# Patient Record
Sex: Female | Born: 1955 | Race: White | Hispanic: No | Smoking: Former smoker
Health system: Southern US, Community
[De-identification: ages and names within clinical notes are randomized; demographics above are authoritative.]

## PROBLEM LIST (undated history)

## (undated) DIAGNOSIS — I493 Ventricular premature depolarization: Secondary | ICD-10-CM

## (undated) DIAGNOSIS — Z9889 Other specified postprocedural states: Secondary | ICD-10-CM

## (undated) DIAGNOSIS — I10 Essential (primary) hypertension: Secondary | ICD-10-CM

## (undated) DIAGNOSIS — M1990 Unspecified osteoarthritis, unspecified site: Secondary | ICD-10-CM

## (undated) DIAGNOSIS — E559 Vitamin D deficiency, unspecified: Secondary | ICD-10-CM

## (undated) DIAGNOSIS — Z973 Presence of spectacles and contact lenses: Secondary | ICD-10-CM

## (undated) DIAGNOSIS — J449 Chronic obstructive pulmonary disease, unspecified: Secondary | ICD-10-CM

## (undated) DIAGNOSIS — I8390 Asymptomatic varicose veins of unspecified lower extremity: Secondary | ICD-10-CM

## (undated) DIAGNOSIS — I2510 Atherosclerotic heart disease of native coronary artery without angina pectoris: Secondary | ICD-10-CM

## (undated) DIAGNOSIS — T8859XA Other complications of anesthesia, initial encounter: Secondary | ICD-10-CM

## (undated) DIAGNOSIS — G43109 Migraine with aura, not intractable, without status migrainosus: Secondary | ICD-10-CM

## (undated) DIAGNOSIS — Q605 Renal hypoplasia, unspecified: Secondary | ICD-10-CM

## (undated) DIAGNOSIS — R112 Nausea with vomiting, unspecified: Secondary | ICD-10-CM

## (undated) DIAGNOSIS — E041 Nontoxic single thyroid nodule: Secondary | ICD-10-CM

## (undated) DIAGNOSIS — Z8669 Personal history of other diseases of the nervous system and sense organs: Secondary | ICD-10-CM

## (undated) DIAGNOSIS — T4145XA Adverse effect of unspecified anesthetic, initial encounter: Secondary | ICD-10-CM

## (undated) DIAGNOSIS — F419 Anxiety disorder, unspecified: Secondary | ICD-10-CM

## (undated) DIAGNOSIS — K219 Gastro-esophageal reflux disease without esophagitis: Secondary | ICD-10-CM

## (undated) DIAGNOSIS — IMO0001 Reserved for inherently not codable concepts without codable children: Secondary | ICD-10-CM

## (undated) DIAGNOSIS — F411 Generalized anxiety disorder: Secondary | ICD-10-CM

## (undated) DIAGNOSIS — H539 Unspecified visual disturbance: Secondary | ICD-10-CM

## (undated) DIAGNOSIS — R413 Other amnesia: Secondary | ICD-10-CM

## (undated) DIAGNOSIS — C801 Malignant (primary) neoplasm, unspecified: Secondary | ICD-10-CM

## (undated) HISTORY — DX: Personal history of other diseases of the nervous system and sense organs: Z86.69

## (undated) HISTORY — PX: TONSILLECTOMY: SUR1361

## (undated) HISTORY — DX: Renal hypoplasia, unspecified: Q60.5

## (undated) HISTORY — DX: Generalized anxiety disorder: F41.1

## (undated) HISTORY — DX: Unspecified visual disturbance: H53.9

## (undated) HISTORY — DX: Unspecified osteoarthritis, unspecified site: M19.90

## (undated) HISTORY — PX: VARICOSE VEIN SURGERY: SHX832

## (undated) HISTORY — DX: Migraine with aura, not intractable, without status migrainosus: G43.109

## (undated) HISTORY — DX: Essential (primary) hypertension: I10

## (undated) HISTORY — DX: Other amnesia: R41.3

## (undated) HISTORY — DX: Vitamin D deficiency, unspecified: E55.9

## (undated) HISTORY — DX: Nontoxic single thyroid nodule: E04.1

## (undated) HISTORY — DX: Asymptomatic varicose veins of unspecified lower extremity: I83.90

## (undated) HISTORY — PX: CARPAL TUNNEL RELEASE: SHX101

## (undated) HISTORY — PX: DILATION AND CURETTAGE OF UTERUS: SHX78

## (undated) HISTORY — DX: Atherosclerotic heart disease of native coronary artery without angina pectoris: I25.10

## (undated) HISTORY — PX: COLONOSCOPY W/ BIOPSIES AND POLYPECTOMY: SHX1376

---

## 2002-01-24 ENCOUNTER — Other Ambulatory Visit: Admission: RE | Admit: 2002-01-24 | Discharge: 2002-01-24 | Payer: Self-pay

## 2009-02-28 ENCOUNTER — Other Ambulatory Visit: Admission: RE | Admit: 2009-02-28 | Discharge: 2009-02-28 | Payer: Self-pay | Admitting: Obstetrics and Gynecology

## 2009-07-07 ENCOUNTER — Encounter: Admission: RE | Admit: 2009-07-07 | Discharge: 2009-07-07 | Payer: Self-pay | Admitting: Neurological Surgery

## 2009-11-19 ENCOUNTER — Emergency Department (HOSPITAL_COMMUNITY): Admission: EM | Admit: 2009-11-19 | Discharge: 2009-11-20 | Payer: Self-pay | Admitting: Emergency Medicine

## 2011-11-19 ENCOUNTER — Other Ambulatory Visit: Payer: Self-pay | Admitting: Internal Medicine

## 2011-11-19 DIAGNOSIS — H539 Unspecified visual disturbance: Secondary | ICD-10-CM

## 2011-11-24 ENCOUNTER — Inpatient Hospital Stay: Admission: RE | Admit: 2011-11-24 | Payer: Self-pay | Source: Ambulatory Visit

## 2011-12-01 ENCOUNTER — Ambulatory Visit
Admission: RE | Admit: 2011-12-01 | Discharge: 2011-12-01 | Disposition: A | Discharge status: Home / Self Care | Payer: Managed Care, Other (non HMO) | Source: Ambulatory Visit | Attending: Internal Medicine | Admitting: Internal Medicine

## 2011-12-01 DIAGNOSIS — H539 Unspecified visual disturbance: Secondary | ICD-10-CM

## 2011-12-01 MED ORDER — GADOBENATE DIMEGLUMINE 529 MG/ML IV SOLN
14.0000 mL | Freq: Once | INTRAVENOUS | Status: AC | PRN
  Administered 2011-12-01: 14 mL via INTRAVENOUS

## 2012-01-25 DIAGNOSIS — H53129 Transient visual loss, unspecified eye: Secondary | ICD-10-CM

## 2012-01-25 HISTORY — DX: Transient visual loss, unspecified eye: H53.129

## 2012-04-28 ENCOUNTER — Other Ambulatory Visit: Payer: Self-pay | Admitting: Internal Medicine

## 2012-04-28 DIAGNOSIS — E041 Nontoxic single thyroid nodule: Secondary | ICD-10-CM

## 2012-05-11 ENCOUNTER — Ambulatory Visit
Admission: RE | Admit: 2012-05-11 | Discharge: 2012-05-11 | Disposition: A | Discharge status: Home / Self Care | Payer: Managed Care, Other (non HMO) | Source: Ambulatory Visit | Attending: Internal Medicine | Admitting: Internal Medicine

## 2012-05-11 DIAGNOSIS — E041 Nontoxic single thyroid nodule: Secondary | ICD-10-CM

## 2012-05-15 ENCOUNTER — Other Ambulatory Visit: Payer: Self-pay | Admitting: Internal Medicine

## 2012-05-15 DIAGNOSIS — E041 Nontoxic single thyroid nodule: Secondary | ICD-10-CM

## 2012-05-18 ENCOUNTER — Ambulatory Visit
Admission: RE | Admit: 2012-05-18 | Discharge: 2012-05-18 | Disposition: A | Discharge status: Home / Self Care | Payer: Managed Care, Other (non HMO) | Source: Ambulatory Visit | Attending: Internal Medicine | Admitting: Internal Medicine

## 2012-05-18 ENCOUNTER — Other Ambulatory Visit (HOSPITAL_COMMUNITY)
Admission: RE | Admit: 2012-05-18 | Discharge: 2012-05-18 | Disposition: A | Discharge status: Home / Self Care | Payer: Managed Care, Other (non HMO) | Source: Ambulatory Visit | Attending: Diagnostic Radiology | Admitting: Diagnostic Radiology

## 2012-05-18 DIAGNOSIS — E041 Nontoxic single thyroid nodule: Secondary | ICD-10-CM

## 2012-05-18 DIAGNOSIS — E049 Nontoxic goiter, unspecified: Secondary | ICD-10-CM | POA: Insufficient documentation

## 2012-06-29 ENCOUNTER — Other Ambulatory Visit: Payer: Self-pay | Admitting: Internal Medicine

## 2012-06-29 ENCOUNTER — Other Ambulatory Visit (HOSPITAL_COMMUNITY)
Admission: RE | Admit: 2012-06-29 | Discharge: 2012-06-29 | Disposition: A | Discharge status: Home / Self Care | Payer: Managed Care, Other (non HMO) | Source: Ambulatory Visit | Attending: Internal Medicine | Admitting: Internal Medicine

## 2012-06-29 DIAGNOSIS — Z01419 Encounter for gynecological examination (general) (routine) without abnormal findings: Secondary | ICD-10-CM | POA: Insufficient documentation

## 2012-06-29 DIAGNOSIS — Z1151 Encounter for screening for human papillomavirus (HPV): Secondary | ICD-10-CM | POA: Insufficient documentation

## 2012-11-09 ENCOUNTER — Ambulatory Visit (INDEPENDENT_AMBULATORY_CARE_PROVIDER_SITE_OTHER): Payer: Managed Care, Other (non HMO) | Admitting: Neurology

## 2012-11-09 ENCOUNTER — Encounter: Payer: Self-pay | Admitting: Neurology

## 2012-11-09 VITALS — BP 130/75 | HR 86 | Ht 66.0 in | Wt 151.0 lb

## 2012-11-09 DIAGNOSIS — H53129 Transient visual loss, unspecified eye: Secondary | ICD-10-CM

## 2012-11-09 DIAGNOSIS — G43809 Other migraine, not intractable, without status migrainosus: Secondary | ICD-10-CM

## 2012-11-09 DIAGNOSIS — G43109 Migraine with aura, not intractable, without status migrainosus: Secondary | ICD-10-CM

## 2012-11-09 HISTORY — DX: Migraine with aura, not intractable, without status migrainosus: G43.109

## 2012-11-09 MED ORDER — PREDNISONE 10 MG PO TABS: ORAL_TABLET | ORAL | Status: DC

## 2012-11-09 NOTE — Progress Notes (Signed)
Reason for visit: Migraine equivalent  [name] [name] is an 57 y.o. female  History of present illness:  [name] [name] is a 57 year old patient, right-handed, with a history of transient episodes of visual disturbance primarily in the left homonymous visual field. The episodes last about 30 minutes and then clear. The patient was placed on verapamil, and the episodes disappeared for 4 months. The patient had a recent episode lasting about 40 minutes, but this was the first episode in 4 months. The patient denies any headache with the events. The patient reports no numbness or weakness of the face, arms, or legs. The patient has had a negative workup to include a MRI of the brain, MRA of the head, and carotid Doppler study. The patient does smoke cigarettes. The patient has some episodes of dizziness on occasion on the verapamil. The patient returns for an evaluation. The patient also reports some mild forgetfulness at times.  Past Medical History  Diagnosis Date  . Visual disturbance     Episodic  . History of migraine   . Hypertension   . Thyroid nodule   . Varicose veins     left leg  . Vitamin D deficiency   . Congenital renal atrophy     left kidney  . Migraine equivalent 11/09/2012  . Degenerative arthritis     Past Surgical History  Procedure Laterality Date  . Tonsillectomy    . Cesarean section    . Carpal tunnel release Left     Family History  Problem Relation Age of Onset  . Cancer Mother     cancer of the small intestines  . Hypertension Father   . Atrial fibrillation Father   . Diverticulitis Father   . Hypertension Sister   . Diverticulitis Brother   . Hypertension Sister   . Headache Sister     Social history:  reports that she has been smoking Cigarettes.  She has been smoking about 0.50 packs per day. She has never used smokeless tobacco. She reports that  drinks alcohol. She reports that she does not use illicit drugs.  Allergies:  Allergies  Allergen  Reactions  . Sulfa Antibiotics     Medications:  No current outpatient prescriptions on file prior to visit.   No current facility-administered medications on file prior to visit.    ROS:  Out of a complete 14 system review of symptoms, the patient complains only of the following symptoms, and all other reviewed systems are negative.  Fatigue Loss of vision Shortness of breath, cough Memory loss Dizziness  Blood pressure 130/75, pulse 86, height 5\' 6"  (1.676 m), weight 151 lb (68.493 kg).  Physical Exam  General: The patient is alert and cooperative at the time of the examination.  Skin: No significant peripheral edema is noted.   Neurologic Exam  Cranial nerves: Facial symmetry is present. Speech is normal, no aphasia or dysarthria is noted. Extraocular movements are full. Visual fields are full. Pupils are equal, round, and reactive to light. Discs are flat bilaterally.  Motor: The patient has good strength in all 4 extremities.  Coordination: The patient has good finger-nose-finger and heel-to-shin bilaterally.  Gait and station: The patient has a normal gait. Tandem gait is normal. Romberg is negative. No drift is seen.  Reflexes: Deep tendon reflexes are symmetric.   Assessment/Plan:  1. Transient visual loss, migraine equivalent  The patient will continue the verapamil at 180 mg dose for now. If the episodes of visual disturbance continue, the dose  may need to be increased to a 240 milligrams daily dose. The patient otherwise will followup through this office if needed.  Marlan Palau MD 11/09/2012 7:22 PM  [facility] [address] [place], [place] [address]  Phone [phone] Fax [phone]

## 2012-11-21 ENCOUNTER — Telehealth: Payer: Self-pay | Admitting: *Deleted

## 2012-11-21 NOTE — Telephone Encounter (Signed)
I called patient. I am not clear why the prescription for prednisone was called and under her name. She is not to take this medication. If her symptoms persist, we will go to verapamil, taking 120 mg tablets twice daily. The patient will call me if she believes that her symptoms are worsening.

## 2012-11-21 NOTE — Telephone Encounter (Signed)
Please advise on prednisone.  Thanks

## 2012-11-21 NOTE — Telephone Encounter (Signed)
Message copied by Hermenia Fiscal on Tue Nov 21, 2012  2:14 PM ------      Message from: Seth Bake      Created: Tue Nov 21, 2012 12:42 PM      Contact: PATIENT       States she was seen on June 6 and was instructed to call if her problem continued.  But she got notification from her pharmacy that she has a prescription for Prednisone from Dr. Anne Hahn.  Is she suppose to take it?  States this was not mentioned to her at her visit. Please call to let her know if she is indeed suppose to take it.   OK to leave a message if she does not answer.   ------

## 2013-09-11 ENCOUNTER — Other Ambulatory Visit: Payer: Self-pay

## 2013-09-19 ENCOUNTER — Other Ambulatory Visit: Payer: Self-pay | Admitting: Gastroenterology

## 2013-09-19 DIAGNOSIS — R1310 Dysphagia, unspecified: Secondary | ICD-10-CM

## 2013-09-20 ENCOUNTER — Ambulatory Visit
Admission: RE | Admit: 2013-09-20 | Discharge: 2013-09-20 | Disposition: A | Discharge status: Home / Self Care | Payer: Managed Care, Other (non HMO) | Source: Ambulatory Visit | Attending: Gastroenterology | Admitting: Gastroenterology

## 2013-09-20 DIAGNOSIS — R1310 Dysphagia, unspecified: Secondary | ICD-10-CM

## 2013-11-09 ENCOUNTER — Other Ambulatory Visit: Payer: Self-pay | Admitting: Gastroenterology

## 2014-02-15 ENCOUNTER — Ambulatory Visit
Admission: RE | Admit: 2014-02-15 | Discharge: 2014-02-15 | Disposition: A | Discharge status: Home / Self Care | Payer: Managed Care, Other (non HMO) | Source: Ambulatory Visit | Attending: Internal Medicine | Admitting: Internal Medicine

## 2014-02-15 ENCOUNTER — Other Ambulatory Visit: Payer: Self-pay | Admitting: Internal Medicine

## 2014-02-15 DIAGNOSIS — R042 Hemoptysis: Secondary | ICD-10-CM

## 2014-04-04 ENCOUNTER — Institutional Professional Consult (permissible substitution): Payer: Managed Care, Other (non HMO) | Admitting: Emergency Medicine

## 2014-04-09 ENCOUNTER — Encounter: Payer: Self-pay | Admitting: Emergency Medicine

## 2014-04-09 ENCOUNTER — Ambulatory Visit (INDEPENDENT_AMBULATORY_CARE_PROVIDER_SITE_OTHER): Payer: Managed Care, Other (non HMO) | Admitting: Emergency Medicine

## 2014-04-09 ENCOUNTER — Encounter (INDEPENDENT_AMBULATORY_CARE_PROVIDER_SITE_OTHER): Payer: Self-pay

## 2014-04-09 VITALS — BP 142/86 | HR 88 | Temp 97.0°F | Ht 65.5 in | Wt 165.4 lb

## 2014-04-09 DIAGNOSIS — Z87891 Personal history of nicotine dependence: Secondary | ICD-10-CM | POA: Insufficient documentation

## 2014-04-09 DIAGNOSIS — J9811 Atelectasis: Secondary | ICD-10-CM

## 2014-04-09 DIAGNOSIS — R05 Cough: Secondary | ICD-10-CM

## 2014-04-09 DIAGNOSIS — R059 Cough, unspecified: Secondary | ICD-10-CM | POA: Insufficient documentation

## 2014-04-09 DIAGNOSIS — J449 Chronic obstructive pulmonary disease, unspecified: Secondary | ICD-10-CM | POA: Insufficient documentation

## 2014-04-09 DIAGNOSIS — R042 Hemoptysis: Secondary | ICD-10-CM

## 2014-04-09 MED ORDER — OMEPRAZOLE 20 MG PO CPDR: 20.0000 mg | DELAYED_RELEASE_CAPSULE | Freq: Two times a day (BID) | ORAL | Status: DC

## 2014-04-09 MED ORDER — FLUTICASONE PROPIONATE 50 MCG/ACT NA SUSP: 2.0000 | Freq: Two times a day (BID) | NASAL | Status: DC

## 2014-04-09 MED ORDER — LORATADINE 10 MG PO TABS: 10.0000 mg | ORAL_TABLET | Freq: Every day | ORAL | Status: DC

## 2014-04-09 NOTE — Assessment & Plan Note (Signed)
Paroxysmal cough with unclear etiology. She has been diagnosed with obstructive lung disease but has not benefited from bronchodilators. Suspect that her allergic rhinitis is at least one component since she has responded somewhat to Flonase. Consider also GERD since this is a common exacerbating her cough. Her linear atelectasis on chest x-ray and her recent hemoptysis basic question of an anatomical finding. I believe she needs a CT scan of the chest to better define her airways and parenchymal disease. She may merit bronchoscopy depending on her CT results and her response to empiric therapy. Will perform full PFT to better define her airflow limitation.. Follow-up in one month

## 2014-04-09 NOTE — Patient Instructions (Signed)
We will perform a CT scan of the chest Increase your fluticasone nasal spray to 2 sprays each side twice a day Restart loratadine 10mg  daily Start omeprazole 20mg  twice a day until next visit We will perform full pulmonary function testing Follow with Dr Lamonte Sakai in 1 month

## 2014-04-09 NOTE — Progress Notes (Signed)
Subjective:    Patient ID: [name] [name], female    DOB: 1956/01/25, 58 y.o.   MRN: [id number]  HPI 58 yo woman, former smoker (20 pk-yrs), hx of HTN, migraines. She is referred by Dr Amedeo Kinsman for chronic cough, hemoptysis. She has been given a dx of COPD in 2014. Spirometry was done > per pt shows AFL. Imaging has identified some L sided linear atx (CXR 02/2014).   The cough has been non-productive for most part, often paroxysmal, can cause emesis. Has waxed and waned. She has been treated with abx, with scheduled BD's. For about a week in August she experienced some hemoptysis, blood streaks in clear sputum.    She denies any real allergy sx. She tries loratadine on her own > no difference. Was started on flonase and seemed to help some. She does not have any heartburn or noticeable GERD   Review of Systems  Constitutional: Negative for fever and unexpected weight change.  HENT: Negative for congestion, dental problem, ear pain, nosebleeds, postnasal drip, rhinorrhea, sinus pressure, sneezing, sore throat and trouble swallowing.   Eyes: Negative for redness and itching.  Respiratory: Positive for cough and shortness of breath. Negative for chest tightness and wheezing.   Cardiovascular: Negative for palpitations and leg swelling.  Gastrointestinal: Negative for nausea and vomiting.  Genitourinary: Negative for dysuria.  Musculoskeletal: Negative for joint swelling.  Skin: Negative for rash.  Neurological: Negative for headaches.  Hematological: Does not bruise/bleed easily.  Psychiatric/Behavioral: Negative for dysphoric mood. The patient is not nervous/anxious.     Past Medical History  Diagnosis Date  . Visual disturbance     Episodic  . History of migraine   . Hypertension   . Thyroid nodule   . Varicose veins     left leg  . Vitamin D deficiency   . Congenital renal atrophy     left kidney  . Migraine equivalent 11/09/2012  . Degenerative arthritis      Family History    Problem Relation Age of Onset  . Cancer Mother     cancer of the small intestines  . Hypertension Father   . Atrial fibrillation Father   . Diverticulitis Father   . Hypertension Sister   . Diverticulitis Brother   . Hypertension Sister   . Headache Sister      History   Social History  . Marital Status: Married    Spouse Name: N/A    Number of Children: 1  . Years of Education: 16   Occupational History  . nurse [employer]   Social History Main Topics  . Smoking status: Former Smoker -- 0.50 packs/day for 40 years    Types: Cigarettes    Quit date: 03/17/2013  . Smokeless tobacco: Never Used  . Alcohol Use: 0.0 oz/week    0 Not specified per week     Comment: Drinks wine on occasion  . Drug Use: No  . Sexual Activity: Not on file   Other Topics Concern  . Not on file   Social History Narrative     Allergies  Allergen Reactions  . Hydrocodone-Guaifenesin Itching  . Sulfa Antibiotics Hives and Swelling     Outpatient Prescriptions Prior to Visit  Medication Sig Dispense Refill  . verapamil (CALAN-SR) 180 MG CR tablet Take 180 mg by mouth at bedtime.     No facility-administered medications prior to visit.         Objective:   Physical Exam Filed Vitals:   04/09/14  1440  BP: 142/86  Pulse: 88  Temp: 97 F (36.1 C)  TempSrc: Oral  Height: 5' 5.5" (1.664 m)  Weight: 165 lb 6.4 oz (75.025 kg)  SpO2: 97%   Gen: Pleasant, well-nourished, in no distress,  normal affect  ENT: No lesions,  mouth clear,  oropharynx clear, no postnasal drip  Lungs: No use of accessory muscles, clear without rales or rhonchi  Cardiovascular: RRR, heart sounds normal, no murmur or gallops, no peripheral edema  Musculoskeletal: No deformities, no cyanosis or clubbing  Neuro: alert, non focal  Skin: Warm, no lesions or rashes       Assessment & Plan:  Cough Paroxysmal cough with unclear etiology. She has been diagnosed with obstructive lung disease but  has not benefited from bronchodilators. Suspect that her allergic rhinitis is at least one component since she has responded somewhat to Flonase. Consider also GERD since this is a common exacerbating her cough. Her linear atelectasis on chest x-ray and her recent hemoptysis basic question of an anatomical finding. I believe she needs a CT scan of the chest to better define her airways and parenchymal disease. She may merit bronchoscopy depending on her CT results and her response to empiric therapy. Will perform full PFT to better define her airflow limitation.. Follow-up in one month  Hemoptysis Resolved. This may push [place] to perform bronchoscopy sooner.

## 2014-04-09 NOTE — Assessment & Plan Note (Signed)
Resolved. This may push [place] to perform bronchoscopy sooner.

## 2014-04-11 ENCOUNTER — Telehealth: Payer: Self-pay | Admitting: Emergency Medicine

## 2014-04-11 ENCOUNTER — Other Ambulatory Visit: Payer: Self-pay | Admitting: Emergency Medicine

## 2014-04-11 ENCOUNTER — Ambulatory Visit (INDEPENDENT_AMBULATORY_CARE_PROVIDER_SITE_OTHER)
Admission: RE | Admit: 2014-04-11 | Discharge: 2014-04-11 | Disposition: A | Discharge status: Home / Self Care | Payer: Managed Care, Other (non HMO) | Source: Ambulatory Visit | Attending: Emergency Medicine | Admitting: Emergency Medicine

## 2014-04-11 DIAGNOSIS — Z87891 Personal history of nicotine dependence: Secondary | ICD-10-CM

## 2014-04-11 DIAGNOSIS — J449 Chronic obstructive pulmonary disease, unspecified: Secondary | ICD-10-CM

## 2014-04-11 DIAGNOSIS — R042 Hemoptysis: Secondary | ICD-10-CM

## 2014-04-11 DIAGNOSIS — J9811 Atelectasis: Secondary | ICD-10-CM

## 2014-04-11 DIAGNOSIS — R918 Other nonspecific abnormal finding of lung field: Secondary | ICD-10-CM

## 2014-04-11 MED ORDER — IOHEXOL 300 MG/ML  SOLN
80.0000 mL | Freq: Once | INTRAMUSCULAR | Status: AC | PRN
  Administered 2014-04-11: 80 mL via INTRAVENOUS

## 2014-04-11 NOTE — Telephone Encounter (Signed)
Reviewed CT findings with her this pm. I believe best strategy for bx will be EBUS. I will attempt to arrange asap.

## 2014-04-15 ENCOUNTER — Encounter (HOSPITAL_COMMUNITY)
Admission: RE | Admit: 2014-04-15 | Discharge: 2014-04-15 | Disposition: A | Discharge status: Home / Self Care | Payer: Managed Care, Other (non HMO) | Source: Ambulatory Visit | Attending: Emergency Medicine | Admitting: Emergency Medicine

## 2014-04-15 ENCOUNTER — Encounter (HOSPITAL_COMMUNITY): Payer: Self-pay

## 2014-04-15 ENCOUNTER — Telehealth: Payer: Self-pay | Admitting: Emergency Medicine

## 2014-04-15 DIAGNOSIS — Z87891 Personal history of nicotine dependence: Secondary | ICD-10-CM | POA: Diagnosis not present

## 2014-04-15 DIAGNOSIS — I10 Essential (primary) hypertension: Secondary | ICD-10-CM | POA: Diagnosis not present

## 2014-04-15 DIAGNOSIS — M1990 Unspecified osteoarthritis, unspecified site: Secondary | ICD-10-CM | POA: Diagnosis not present

## 2014-04-15 DIAGNOSIS — J449 Chronic obstructive pulmonary disease, unspecified: Secondary | ICD-10-CM | POA: Diagnosis not present

## 2014-04-15 DIAGNOSIS — Z882 Allergy status to sulfonamides status: Secondary | ICD-10-CM | POA: Diagnosis not present

## 2014-04-15 DIAGNOSIS — K219 Gastro-esophageal reflux disease without esophagitis: Secondary | ICD-10-CM | POA: Diagnosis not present

## 2014-04-15 DIAGNOSIS — E559 Vitamin D deficiency, unspecified: Secondary | ICD-10-CM | POA: Diagnosis not present

## 2014-04-15 DIAGNOSIS — R918 Other nonspecific abnormal finding of lung field: Secondary | ICD-10-CM | POA: Diagnosis present

## 2014-04-15 DIAGNOSIS — Z885 Allergy status to narcotic agent status: Secondary | ICD-10-CM | POA: Diagnosis not present

## 2014-04-15 DIAGNOSIS — Q603 Renal hypoplasia, unilateral: Secondary | ICD-10-CM | POA: Diagnosis not present

## 2014-04-15 DIAGNOSIS — G43909 Migraine, unspecified, not intractable, without status migrainosus: Secondary | ICD-10-CM | POA: Diagnosis not present

## 2014-04-15 DIAGNOSIS — C3431 Malignant neoplasm of lower lobe, right bronchus or lung: Secondary | ICD-10-CM | POA: Diagnosis not present

## 2014-04-15 HISTORY — DX: Chronic obstructive pulmonary disease, unspecified: J44.9

## 2014-04-15 HISTORY — DX: Gastro-esophageal reflux disease without esophagitis: K21.9

## 2014-04-15 HISTORY — DX: Presence of spectacles and contact lenses: Z97.3

## 2014-04-15 HISTORY — DX: Reserved for inherently not codable concepts without codable children: IMO0001

## 2014-04-15 LAB — BASIC METABOLIC PANEL
Anion gap: 13 (ref 5–15)
BUN: 14 mg/dL (ref 6–23)
CHLORIDE: 103 meq/L (ref 96–112)
CO2: 25 mEq/L (ref 19–32)
Calcium: 9.5 mg/dL (ref 8.4–10.5)
Creatinine, Ser: 0.65 mg/dL (ref 0.50–1.10)
Glucose, Bld: 113 mg/dL — ABNORMAL HIGH (ref 70–99)
Potassium: 4.4 mEq/L (ref 3.7–5.3)
SODIUM: 141 meq/L (ref 137–147)

## 2014-04-15 LAB — CBC
HCT: 42.5 % (ref 36.0–46.0)
HEMOGLOBIN: 14.1 g/dL (ref 12.0–15.0)
MCH: 30.3 pg (ref 26.0–34.0)
MCHC: 33.2 g/dL (ref 30.0–36.0)
MCV: 91.2 fL (ref 78.0–100.0)
PLATELETS: 289 10*3/uL (ref 150–400)
RBC: 4.66 MIL/uL (ref 3.87–5.11)
RDW: 13.1 % (ref 11.5–15.5)
WBC: 7.7 10*3/uL (ref 4.0–10.5)

## 2014-04-15 NOTE — Progress Notes (Signed)
Anesthesia PAT Evaluation:  Patient is a 58 year old female scheduled for video bronchoscopy with endobronchial ultrasound on 04/17/14 by Dr. Lamonte Sakai. She was recently referred to pulmonology for chronic cough with hemoptysis which occurred ~ 01/2014.  History includes new findings of RLL lung mass, former smoker (quit 03/17/13), migraines with visual disturbances, HTN, left renal congenital atrophy, vitamin D deficiency, COPD, exertional dyspnea, GERD, thyroid nodule (reported negative work-up and TSH), varicose veins, tonsillectomy. BMI 26.  PCP is Dr. Melanie Crazier Shamleffer [phone]) with [facility] (had previously seen Dr. Volanda Napoleon until she relocated). 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   Vitals: BP 146/66, HR 95, RR 20, O2 sat 95%, T 36.4 C.  Meds: Calcium with vitamin D, Vitamin B12, MVI, Probiotic, Claritin, Prilosec, verapamil.  EKG on 04/15/14 generated a computer reading of Critical prolonged QT/QTc of 748/835 ms. (I measured QT at 480 ms.)  She had underlying SR but with frequent PVCs. She has rather diffuse flat T waves.  (See below for cardiology phone input.)  I was called to see patient regarding critical EKG report.  She denies chest pain, SOB at rest, edema, syncope, palpitations, no family history of sudden death.  Her exertional dyspnea is much improved since she quit smoking.  She is able to climb a flight of stairs without difficulty. She took a 10 day course of Levaquin ~ 02/2014.  She was unsure about resting PVC history, but reported a prior exercise stress test ~ 3 years ago which showed multiple PVCs which lead to a nuclear stress test which was normal.  She was initially started on a "beta blocker" but had severe migraines with visual disturbances and was switched to verapamil ~ 2 years ago without incident. She rarely drinks any alcohol.  She typically drinks 2 cups of coffee each morning, but only had 1/2 cup today.  She is a very pleasant  female, but is a little emotional due to not only her current medical issues, but also because her husband was recently diagnosed with colon cancer and is meeting the oncologist today for the first time. On exam, heart sounds are a little distant.  Underlying regular rhythm but with frequent ectopic beat correlating with her PVCs on EKG.  No murmur noted.  Lungs clear.  Nuclear stress test 07/03/10 [facility]): Normal myocardial perfusion study.  Normal post-stress LVF, EF 57%. Exercise capacity of 7 METS. (ETT [exercise portion] 06/24/10: Multi-focal PVCs during exercise, and triplets noted; 19mm horizontal ST segment depression in inferior leads. She reported that this lead to her 07/03/10 nuclear study.)  Carotid duplex 12/22/11 [facility]): No significant carotid artery disease bilaterally.  CXR on 02/15/14: Linear densities in the left lung are most compatible with atelectasis.  Chest CT on 04/11/14: 1. 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 2. Mass abuts the right lower lobe bronchus is likely the source of hemoptysis. 3. Subcarinal enlarged lymph node is concerning for ipsilateral nodal metastasis.  Preoperative CBC and BMET noted.  EKG reviewed with patient.  I also discussed with anesthesiologist Dr. Marcie Bal and called and spoke with covering hospital cardiologist Dr. Marlou Porch about patient's history, EKG and plans for surgery.  He reviewed EKG findings. He felt QT was measuring at 450 ms.  Since patient was asymptomatic and had known PVCs, he felt EKG findings should not interfere with plans for this procedure if she had no changes in her symptomology. He felt it was  okay to continue verapamil, but should notify her physicians of prolonged QT on prior EKG if antibiotics were warranted in the future.  In regards to long-term cardiology follow-up, he felt that could be deferred to her PCP.  I notified patient of this. I did tell her that  if she required additional higher risk surgery based on pathology reports then I thought it would be a good idea to go ahead and discuss plans with Dr. Kelton Pillar, so if there were additional recommendations then those could be placed in motion.  George Hugh [facility] Short Stay Center/Anesthesiology Phone [phone] 04/15/2014 1:01 PM

## 2014-04-15 NOTE — Telephone Encounter (Signed)
Spoke with Sherlynn Stalls at [facility] who is seeing pt for pre-admit for EBUS at 10:00, states that the pt does not have any orders in for her EBUS.  I asked what orders she needed put in, she stated that I probably wouldn't be able to help her and that RB could put them in if I let him know.  RB, just FYI.

## 2014-04-15 NOTE — Telephone Encounter (Signed)
EBUS 04/17/14@[facility]  pt aware Joellen Jersey

## 2014-04-15 NOTE — Progress Notes (Signed)
Ebony Hail, [place] notified of critical EKG results

## 2014-04-15 NOTE — Pre-Procedure Instructions (Signed)
[name] [name]  04/15/2014   Your procedure is scheduled on: Wednesday, April 17, 2014  Report to [facility] Admitting at 8:15 AM.  Call this number if you have problems the morning of surgery: [phone]   Remember:   Do not eat food or drink liquids after midnight Tuesday, April 16, 2014   Take these medicines the morning of surgery with A SIP OF WATER: loratadine (CLARITIN),  omeprazole (PRILOSEC), fluticasone (FLONASE) nasal spray  Stop taking Aspirin, vitamins, and herbal medications. Do not take any NSAIDs ie: Ibuprofen, Advil, Naproxen or any medication containing Aspirin.    Do not wear jewelry, make-up or nail polish.  Do not wear lotions, powders, or perfumes. You may not wear deodorant.  Do not shave 48 hours prior to surgery.   Do not bring valuables to the hospital.  [facility] is not responsible for any belongings or valuables.               Contacts, dentures or bridgework may not be worn into surgery.  Leave suitcase in the car. After surgery it may be brought to your room.  For patients admitted to the hospital, discharge time is determined by your treatment team.               Patients discharged the day of surgery will not be allowed to drive home.  Name and phone number of your driver:   Special Instructions:  Special Instructions:Special Instructions: [facility] - Preparing for Surgery  Before surgery, you can play an important role.  Because skin is not sterile, your skin needs to be as free of germs as possible.  You can reduce the number of germs on you skin by washing with CHG (chlorahexidine gluconate) soap before surgery.  CHG is an antiseptic cleaner which kills germs and bonds with the skin to continue killing germs even after washing.  Please DO NOT use if you have an allergy to CHG or antibacterial soaps.  If your skin becomes reddened/irritated stop using the CHG and inform your nurse when you arrive at Short Stay.  Do not shave  (including legs and underarms) for at least 48 hours prior to the first CHG shower.  You may shave your face.  Please follow these instructions carefully:   1.  Shower with CHG Soap the night before surgery and the morning of Surgery.  2.  If you choose to wash your hair, wash your hair first as usual with your normal shampoo.  3.  After you shampoo, rinse your hair and body thoroughly to remove the Shampoo.  4.  Use CHG as you would any other liquid soap.  You can apply chg directly  to the skin and wash gently with scrungie or a clean washcloth.  5.  Apply the CHG Soap to your body ONLY FROM THE NECK DOWN.  Do not use on open wounds or open sores.  Avoid contact with your eyes, ears, mouth and genitals (private parts).  Wash genitals (private parts) with your normal soap.  6.  Wash thoroughly, paying special attention to the area where your surgery will be performed.  7.  Thoroughly rinse your body with warm water from the neck down.  8.  DO NOT shower/wash with your normal soap after using and rinsing off the CHG Soap.  9.  Pat yourself dry with a clean towel.            10.  Wear clean pajamas.  11.  Place clean sheets on your bed the night of your first shower and do not sleep with pets.  Day of Surgery  Do not apply any lotions/deodorants the morning of surgery.  Please wear clean clothes to the hospital/surgery center.   Please read over the following fact sheets that you were given: Pain Booklet, Coughing and Deep Breathing and Surgical Site Infection Prevention

## 2014-04-17 ENCOUNTER — Other Ambulatory Visit: Payer: Self-pay | Admitting: *Deleted

## 2014-04-17 ENCOUNTER — Ambulatory Visit (HOSPITAL_COMMUNITY): Payer: Managed Care, Other (non HMO) | Admitting: Certified Registered Nurse Anesthetist

## 2014-04-17 ENCOUNTER — Ambulatory Visit (HOSPITAL_COMMUNITY)
Admission: RE | Admit: 2014-04-17 | Discharge: 2014-04-17 | Disposition: A | Discharge status: Home / Self Care | Payer: Managed Care, Other (non HMO) | Source: Ambulatory Visit | Attending: Emergency Medicine | Admitting: Emergency Medicine

## 2014-04-17 ENCOUNTER — Telehealth: Payer: Self-pay | Admitting: *Deleted

## 2014-04-17 ENCOUNTER — Encounter (HOSPITAL_COMMUNITY)
Admission: RE | Disposition: A | Discharge status: Home / Self Care | Payer: Self-pay | Source: Ambulatory Visit | Attending: Emergency Medicine

## 2014-04-17 ENCOUNTER — Telehealth: Payer: Self-pay | Admitting: Emergency Medicine

## 2014-04-17 ENCOUNTER — Encounter (HOSPITAL_COMMUNITY): Payer: Self-pay | Admitting: *Deleted

## 2014-04-17 DIAGNOSIS — C3491 Malignant neoplasm of unspecified part of right bronchus or lung: Secondary | ICD-10-CM

## 2014-04-17 DIAGNOSIS — Z885 Allergy status to narcotic agent status: Secondary | ICD-10-CM | POA: Insufficient documentation

## 2014-04-17 DIAGNOSIS — R918 Other nonspecific abnormal finding of lung field: Secondary | ICD-10-CM

## 2014-04-17 DIAGNOSIS — Q603 Renal hypoplasia, unilateral: Secondary | ICD-10-CM | POA: Insufficient documentation

## 2014-04-17 DIAGNOSIS — E559 Vitamin D deficiency, unspecified: Secondary | ICD-10-CM | POA: Insufficient documentation

## 2014-04-17 DIAGNOSIS — R042 Hemoptysis: Secondary | ICD-10-CM | POA: Diagnosis present

## 2014-04-17 DIAGNOSIS — Z882 Allergy status to sulfonamides status: Secondary | ICD-10-CM | POA: Insufficient documentation

## 2014-04-17 DIAGNOSIS — K219 Gastro-esophageal reflux disease without esophagitis: Secondary | ICD-10-CM | POA: Insufficient documentation

## 2014-04-17 DIAGNOSIS — R9431 Abnormal electrocardiogram [ECG] [EKG]: Secondary | ICD-10-CM

## 2014-04-17 DIAGNOSIS — J449 Chronic obstructive pulmonary disease, unspecified: Secondary | ICD-10-CM | POA: Insufficient documentation

## 2014-04-17 DIAGNOSIS — C3431 Malignant neoplasm of lower lobe, right bronchus or lung: Secondary | ICD-10-CM

## 2014-04-17 DIAGNOSIS — Z87891 Personal history of nicotine dependence: Secondary | ICD-10-CM | POA: Insufficient documentation

## 2014-04-17 DIAGNOSIS — I10 Essential (primary) hypertension: Secondary | ICD-10-CM | POA: Insufficient documentation

## 2014-04-17 DIAGNOSIS — G43909 Migraine, unspecified, not intractable, without status migrainosus: Secondary | ICD-10-CM | POA: Insufficient documentation

## 2014-04-17 DIAGNOSIS — M1990 Unspecified osteoarthritis, unspecified site: Secondary | ICD-10-CM | POA: Insufficient documentation

## 2014-04-17 DIAGNOSIS — J984 Other disorders of lung: Secondary | ICD-10-CM

## 2014-04-17 HISTORY — DX: Malignant neoplasm of lower lobe, right bronchus or lung: C34.31

## 2014-04-17 HISTORY — PX: VIDEO BRONCHOSCOPY WITH ENDOBRONCHIAL ULTRASOUND: SHX6177

## 2014-04-17 LAB — APTT: APTT: 30 s (ref 24–37)

## 2014-04-17 LAB — PROTIME-INR
INR: 1.13 (ref 0.00–1.49)
PROTHROMBIN TIME: 14.6 s (ref 11.6–15.2)

## 2014-04-17 SURGERY — BRONCHOSCOPY, WITH EBUS
Anesthesia: General | Laterality: Right

## 2014-04-17 MED ORDER — GLYCOPYRROLATE 0.2 MG/ML IJ SOLN
INTRAMUSCULAR | Status: DC | PRN
  Administered 2014-04-17: .2 mg via INTRAVENOUS
  Administered 2014-04-17: .6 mg via INTRAVENOUS

## 2014-04-17 MED ORDER — PROPOFOL 10 MG/ML IV BOLUS
INTRAVENOUS | Status: DC | PRN
  Administered 2014-04-17: 140 mg via INTRAVENOUS

## 2014-04-17 MED ORDER — ROCURONIUM BROMIDE 100 MG/10ML IV SOLN
INTRAVENOUS | Status: DC | PRN
  Administered 2014-04-17: 30 mg via INTRAVENOUS

## 2014-04-17 MED ORDER — SODIUM CHLORIDE 0.9 % IV SOLN
10.0000 mg | INTRAVENOUS | Status: DC | PRN
  Administered 2014-04-17: 10 ug/min via INTRAVENOUS

## 2014-04-17 MED ORDER — LIDOCAINE HCL (CARDIAC) 20 MG/ML IV SOLN
INTRAVENOUS | Status: DC | PRN
  Administered 2014-04-17: 60 mg via INTRAVENOUS

## 2014-04-17 MED ORDER — FENTANYL CITRATE 0.05 MG/ML IJ SOLN: 25.0000 ug | INTRAMUSCULAR | Status: DC | PRN

## 2014-04-17 MED ORDER — NEOSTIGMINE METHYLSULFATE 10 MG/10ML IV SOLN
INTRAVENOUS | Status: AC
  Filled 2014-04-17: qty 1

## 2014-04-17 MED ORDER — LIDOCAINE HCL (CARDIAC) 20 MG/ML IV SOLN
INTRAVENOUS | Status: AC
  Filled 2014-04-17: qty 5

## 2014-04-17 MED ORDER — ONDANSETRON HCL 4 MG/2ML IJ SOLN
INTRAMUSCULAR | Status: AC
  Filled 2014-04-17: qty 2

## 2014-04-17 MED ORDER — PREDNISONE 20 MG PO TABS
40.0000 mg | ORAL_TABLET | Freq: Every day | ORAL | Status: DC
Start: 2014-04-17 — End: 2014-04-25

## 2014-04-17 MED ORDER — FENTANYL CITRATE 0.05 MG/ML IJ SOLN
INTRAMUSCULAR | Status: AC
  Filled 2014-04-17: qty 5

## 2014-04-17 MED ORDER — NEOSTIGMINE METHYLSULFATE 10 MG/10ML IV SOLN
INTRAVENOUS | Status: DC | PRN
  Administered 2014-04-17: 1 mg via INTRAVENOUS
  Administered 2014-04-17: 4 mg via INTRAVENOUS

## 2014-04-17 MED ORDER — ROCURONIUM BROMIDE 50 MG/5ML IV SOLN
INTRAVENOUS | Status: AC
  Filled 2014-04-17: qty 1

## 2014-04-17 MED ORDER — METHYLPREDNISOLONE SODIUM SUCC 125 MG IJ SOLR
60.0000 mg | Freq: Once | INTRAMUSCULAR | Status: AC
  Administered 2014-04-17: 60 mg via INTRAVENOUS
  Filled 2014-04-17: qty 0.96

## 2014-04-17 MED ORDER — 0.9 % SODIUM CHLORIDE (POUR BTL) OPTIME
TOPICAL | Status: DC | PRN
  Administered 2014-04-17: 1000 mL

## 2014-04-17 MED ORDER — PROMETHAZINE HCL 12.5 MG PO TABS: 12.5000 mg | ORAL_TABLET | Freq: Four times a day (QID) | ORAL | Status: DC | PRN

## 2014-04-17 MED ORDER — MIDAZOLAM HCL 5 MG/5ML IJ SOLN
INTRAMUSCULAR | Status: DC | PRN
  Administered 2014-04-17 (×2): 1 mg via INTRAVENOUS

## 2014-04-17 MED ORDER — RACEPINEPHRINE HCL 2.25 % IN NEBU
0.5000 mL | INHALATION_SOLUTION | Freq: Once | RESPIRATORY_TRACT | Status: AC
  Administered 2014-04-17: 0.5 mL via RESPIRATORY_TRACT
  Filled 2014-04-17: qty 0.5

## 2014-04-17 MED ORDER — FENTANYL CITRATE 0.05 MG/ML IJ SOLN
INTRAMUSCULAR | Status: DC | PRN
  Administered 2014-04-17 (×5): 50 ug via INTRAVENOUS

## 2014-04-17 MED ORDER — MIDAZOLAM HCL 2 MG/2ML IJ SOLN
INTRAMUSCULAR | Status: AC
  Filled 2014-04-17: qty 2

## 2014-04-17 MED ORDER — GLYCOPYRROLATE 0.2 MG/ML IJ SOLN
INTRAMUSCULAR | Status: AC
  Filled 2014-04-17: qty 3

## 2014-04-17 MED ORDER — EPINEPHRINE HCL 1 MG/ML IJ SOLN
INTRAMUSCULAR | Status: AC
  Filled 2014-04-17: qty 1

## 2014-04-17 MED ORDER — EPINEPHRINE HCL 1 MG/ML IJ SOLN
INTRAMUSCULAR | Status: DC | PRN
  Administered 2014-04-17: 1 mg

## 2014-04-17 MED ORDER — LACTATED RINGERS IV SOLN
INTRAVENOUS | Status: DC | PRN
  Administered 2014-04-17: 10:00:00 via INTRAVENOUS

## 2014-04-17 MED ORDER — DEXAMETHASONE SODIUM PHOSPHATE 4 MG/ML IJ SOLN
INTRAMUSCULAR | Status: DC | PRN
  Administered 2014-04-17: 4 mg via INTRAVENOUS

## 2014-04-17 MED ORDER — PROPOFOL 10 MG/ML IV BOLUS
INTRAVENOUS | Status: AC
  Filled 2014-04-17: qty 20

## 2014-04-17 MED ORDER — LACTATED RINGERS IV SOLN
INTRAVENOUS | Status: DC
  Administered 2014-04-17: 09:00:00 via INTRAVENOUS

## 2014-04-17 SURGICAL SUPPLY — 28 items
BLADE 10 SAFETY STRL DISP (BLADE) ×2 IMPLANT
BRUSH CYTOL CELLEBRITY 1.5X140 (MISCELLANEOUS) IMPLANT
CANISTER SUCTION 2500CC (MISCELLANEOUS) ×2 IMPLANT
CONT SPEC 4OZ CLIKSEAL STRL BL (MISCELLANEOUS) ×2 IMPLANT
COVER TABLE BACK 60X90 (DRAPES) ×2 IMPLANT
FORCEPS BIOP RJ4 1.8 (CUTTING FORCEPS) IMPLANT
GAUZE SPONGE 4X4 12PLY STRL (GAUZE/BANDAGES/DRESSINGS) IMPLANT
GLOVE BIOGEL M 6.5 STRL (GLOVE) ×1 IMPLANT
GLOVE BIOGEL M STRL SZ7.5 (GLOVE) ×2 IMPLANT
GLOVE BIOGEL PI IND STRL 6.5 (GLOVE) IMPLANT
GLOVE BIOGEL PI INDICATOR 6.5 (GLOVE) ×1
GOWN STRL REUS W/ TWL LRG LVL3 (GOWN DISPOSABLE) ×1 IMPLANT
GOWN STRL REUS W/TWL LRG LVL3 (GOWN DISPOSABLE) ×2
KIT CLEAN ENDO COMPLIANCE (KITS) ×2 IMPLANT
KIT ROOM TURNOVER OR (KITS) ×2 IMPLANT
MARKER SKIN DUAL TIP RULER LAB (MISCELLANEOUS) ×2 IMPLANT
NDL BIOPSY TRANSBRONCH 21G (NEEDLE) IMPLANT
NEEDLE BIOPSY TRANSBRONCH 21G (NEEDLE) IMPLANT
NEEDLE SYS SONOTIP II EBUSTBNA (NEEDLE) ×1 IMPLANT
NS IRRIG 1000ML POUR BTL (IV SOLUTION) ×2 IMPLANT
OIL SILICONE PENTAX (PARTS (SERVICE/REPAIRS)) IMPLANT
PAD ARMBOARD 7.5X6 YLW CONV (MISCELLANEOUS) ×4 IMPLANT
SYR 20CC LL (SYRINGE) ×2 IMPLANT
SYR 20ML ECCENTRIC (SYRINGE) ×4 IMPLANT
SYR 5ML LUER SLIP (SYRINGE) ×2 IMPLANT
TOWEL OR 17X24 6PK STRL BLUE (TOWEL DISPOSABLE) ×2 IMPLANT
TRAP SPECIMEN MUCOUS 40CC (MISCELLANEOUS) IMPLANT
TUBE CONNECTING 20X1/4 (TUBING) ×4 IMPLANT

## 2014-04-17 NOTE — Telephone Encounter (Signed)
Order has been placed for the pt to be set up with [facility].  Will forward back to libby to make her aware.

## 2014-04-17 NOTE — H&P (View-Only) (Signed)
Subjective:    Patient ID: [name] [name], female    DOB: 04/21/1956, 58 y.o.   MRN: [id number]  HPI 58 yo woman, former smoker (20 pk-yrs), hx of HTN, migraines. She is referred by Dr Amedeo Kinsman for chronic cough, hemoptysis. She has been given a dx of COPD in 2014. Spirometry was done > per pt shows AFL. Imaging has identified some L sided linear atx (CXR 02/2014).   The cough has been non-productive for most part, often paroxysmal, can cause emesis. Has waxed and waned. She has been treated with abx, with scheduled BD's. For about a week in August she experienced some hemoptysis, blood streaks in clear sputum.    She denies any real allergy sx. She tries loratadine on her own > no difference. Was started on flonase and seemed to help some. She does not have any heartburn or noticeable GERD   Review of Systems  Constitutional: Negative for fever and unexpected weight change.  HENT: Negative for congestion, dental problem, ear pain, nosebleeds, postnasal drip, rhinorrhea, sinus pressure, sneezing, sore throat and trouble swallowing.   Eyes: Negative for redness and itching.  Respiratory: Positive for cough and shortness of breath. Negative for chest tightness and wheezing.   Cardiovascular: Negative for palpitations and leg swelling.  Gastrointestinal: Negative for nausea and vomiting.  Genitourinary: Negative for dysuria.  Musculoskeletal: Negative for joint swelling.  Skin: Negative for rash.  Neurological: Negative for headaches.  Hematological: Does not bruise/bleed easily.  Psychiatric/Behavioral: Negative for dysphoric mood. The patient is not nervous/anxious.     Past Medical History  Diagnosis Date  . Visual disturbance     Episodic  . History of migraine   . Hypertension   . Thyroid nodule   . Varicose veins     left leg  . Vitamin D deficiency   . Congenital renal atrophy     left kidney  . Migraine equivalent 11/09/2012  . Degenerative arthritis      Family History    Problem Relation Age of Onset  . Cancer Mother     cancer of the small intestines  . Hypertension Father   . Atrial fibrillation Father   . Diverticulitis Father   . Hypertension Sister   . Diverticulitis Brother   . Hypertension Sister   . Headache Sister      History   Social History  . Marital Status: Married    Spouse Name: N/A    Number of Children: 1  . Years of Education: 16   Occupational History  . 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 Smoking status: Former Smoker -- 0.50 packs/day for 40 years    Types: Cigarettes    Quit date: 03/17/2013  . Smokeless tobacco: Never Used  . Alcohol Use: 0.0 oz/week    0 Not specified per week     Comment: Drinks wine on occasion  . Drug Use: No  . Sexual Activity: Not on file   Other Topics Concern  . Not on file   Social History Narrative     Allergies  Allergen Reactions  . Hydrocodone-Guaifenesin Itching  . Sulfa Antibiotics Hives and Swelling     Outpatient Prescriptions Prior to Visit  Medication Sig Dispense Refill  . verapamil (CALAN-SR) 180 MG CR tablet Take 180 mg by mouth at bedtime.     No facility-administered medications prior to visit.         Objective:   Physical Exam Filed Vitals:   04/09/14  1440  BP: 142/86  Pulse: 88  Temp: 97 F (36.1 C)  TempSrc: Oral  Height: 5' 5.5" (1.664 m)  Weight: 165 lb 6.4 oz (75.025 kg)  SpO2: 97%   Gen: Pleasant, well-nourished, in no distress,  normal affect  ENT: No lesions,  mouth clear,  oropharynx clear, no postnasal drip  Lungs: No use of accessory muscles, clear without rales or rhonchi  Cardiovascular: RRR, heart sounds normal, no murmur or gallops, no peripheral edema  Musculoskeletal: No deformities, no cyanosis or clubbing  Neuro: alert, non focal  Skin: Warm, no lesions or rashes       Assessment & Plan:  Cough Paroxysmal cough with unclear etiology. She has been diagnosed with obstructive lung disease but  has not benefited from bronchodilators. Suspect that her allergic rhinitis is at least one component since she has responded somewhat to Flonase. Consider also GERD since this is a common exacerbating her cough. Her linear atelectasis on chest x-ray and her recent hemoptysis basic question of an anatomical finding. I believe she needs a CT scan of the chest to better define her airways and parenchymal disease. She may merit bronchoscopy depending on her CT results and her response to empiric therapy. Will perform full PFT to better define her airflow limitation.. Follow-up in one month  Hemoptysis Resolved. This may push [place] to perform bronchoscopy sooner.

## 2014-04-17 NOTE — Anesthesia Preprocedure Evaluation (Addendum)
Anesthesia Evaluation  Patient identified by MRN, date of birth, ID band Patient awake    Reviewed: Allergy & Precautions, H&P , NPO status , Patient's Chart, lab work & pertinent test results  Airway Mallampati: II  TM Distance: >3 FB Neck ROM: Full    Dental  (+) Dental Advisory Given   Pulmonary shortness of breath, COPDformer smoker,  breath sounds clear to auscultation        Cardiovascular hypertension, Rhythm:Regular Rate:Normal     Neuro/Psych    GI/Hepatic Neg liver ROS, GERD-  ,  Endo/Other  negative endocrine ROS  Renal/GU Renal disease     Musculoskeletal   Abdominal   Peds  Hematology   Anesthesia Other Findings   Reproductive/Obstetrics                           Anesthesia Physical Anesthesia Plan  ASA: III  Anesthesia Plan: General   Post-op Pain Management:    Induction:   Airway Management Planned: Oral ETT  Additional Equipment:   Intra-op Plan:   Post-operative Plan: Possible Post-op intubation/ventilation  Informed Consent: I have reviewed the patients History and Physical, chart, labs and discussed the procedure including the risks, benefits and alternatives for the proposed anesthesia with the patient or authorized representative who has indicated his/her understanding and acceptance.   Dental advisory given  Plan Discussed with: CRNA and Anesthesiologist  Anesthesia Plan Comments:         Anesthesia Quick Evaluation

## 2014-04-17 NOTE — Telephone Encounter (Signed)
Pt is undergoing EBUS today 04/17/14.   She needs an outpt Consult appt with Dr Pernell Dupre in Cardiology to evaluate PVC's and an abnormal QT-c. Please put ion consult request - thanks.

## 2014-04-17 NOTE — Op Note (Signed)
Video Bronchoscopy with Endobronchial Ultrasound Procedure Note  Date of Operation: 04/17/2014  Pre-op Diagnosis: Right lower lobe mass  Post-op Diagnosis: same  Surgeon: Baltazar Apo  Assistants: none  Anesthesia: General endotracheal anesthesia  Operation: Flexible video fiberoptic bronchoscopy with endobronchial ultrasound and biopsies.  Estimated Blood Loss: 32GM  Complications: none apparent  Indications and History: [name] [name] is a 58 y.o. female with history tobacco use. She underwent CT scan of the chest to evaluate some left-sided linear atelectasis after presenting with hemoptysis. Skin showed no left-sided abnormalities but did reveal a right lower lobe medial posterior mass. Recommendation was to perform bronchoscopy with biopsies. The risks, benefits, complications, treatment options and expected outcomes were discussed with the patient.  The possibilities of pneumothorax, pneumonia, reaction to medication, pulmonary aspiration, perforation of a viscus, bleeding, failure to diagnose a condition and creating a complication requiring transfusion or operation were discussed with the patient who freely signed the consent.    Description of Procedure: The patient was examined in the preoperative area and history and data from the preprocedure consultation were reviewed. It was deemed appropriate to proceed.  The patient was taken to OR 10, identified as [name] [name] and the procedure verified as Flexible Video Fiberoptic Bronchoscopy.  A Time Out was held and the above information confirmed. General anesthesia was initiated and the patient  was orally intubated. The video fiberoptic bronchoscope was introduced via the endotracheal tube and a general inspection was performed which showed a white rounded endobronchial lesion in the superior segment right lower lobe. All other airways were inspected and there were no abnormalities seen. Endobronchial brushings and biopsies were  performed in the superior segment of the right lower lobe to be sent for pathology and cytology. The standard scope was then withdrawn and the endobronchial ultrasound was used to identify and characterize the peritracheal, hilar and bronchial lymph nodes. Inspection showed enlargement of the subcarinal station 7 lymph node and a large irregularly-shaped right lower lobe mass. Using real-time ultrasound guidance Wang needle biopsies were take from Station 7 node and from the right lower lobe mass to be sent for cytology. The patient tolerated the procedure well without apparent complications. There was using from the superior segmental endobronchial lesion after brushing and biopsy. The bleeding had subsided by the end of the case. The bronchoscope was withdrawn. Anesthesia was reversed and the patient was taken to the PACU for recovery.   Samples: 1. Wang needle biopsies from 7 node 2. Wang needle biopsies from RLL mass 3. Endobronchial brushings from the right lower lobe superior segment 4. Endobronchial biopsies from the right lower lobe superior segment 5. Bronchial washings from the right lower lobe  Plans:  The patient will be discharged from the PACU to home when recovered from anesthesia. We will review the cytology, pathology results with the patient when they become available. Outpatient followup will be with Dr Lamonte Sakai.    Baltazar Apo, MD, PhD 04/17/2014, 11:52 AM  [facility] [phone] or if no answer [phone]

## 2014-04-17 NOTE — Discharge Instructions (Addendum)
Flexible Bronchoscopy, Care After These instructions give you information on caring for yourself after your procedure. Your doctor may also give you more specific instructions. Call your doctor if you have any problems or questions after your procedure. HOME CARE  Do not eat or drink anything for 2 hours after your procedure. If you try to eat or drink before the medicine wears off, food or drink could go into your lungs. You could also burn yourself.  After 2 hours have passed and when you can cough and gag normally, you may eat soft food and drink liquids slowly.  The day after the test, you may eat your normal diet.  You may do your normal activities.  Keep all doctor visits. GET HELP RIGHT AWAY IF:  You get more and more short of breath.  You get light-headed.  You feel like you are going to pass out (faint).  You have chest pain.  You have new problems that worry you.  You cough up more than a little blood.  You cough up more blood than before. MAKE SURE YOU:  Understand these instructions.  Will watch your condition.  Will get help right away if you are not doing well or get worse. Document Released: 03/21/2009 Document Revised: 05/29/2013 Document Reviewed: 01/26/2013 [facility] Patient Information 2015 [place], [place]. This information is not intended to replace advice given to you by your health care provider. Make sure you discuss any questions you have with your health care provider.  Please color office for any problems, concerns or questions. [phone].   What to eat:  For your first meals, you should eat lightly; only small meals initially.  If you do not have nausea, you may eat larger meals.  Avoid spicy, greasy and heavy food.    General Anesthesia, Adult, Care After  Refer to this sheet in the next few weeks. These instructions provide you with information on caring for yourself after your procedure. Your health care provider may also give you more  specific instructions. Your treatment has been planned according to current medical practices, but problems sometimes occur. Call your health care provider if you have any problems or questions after your procedure.  WHAT TO EXPECT AFTER THE PROCEDURE  After the procedure, it is typical to experience:  Sleepiness.  Nausea and vomiting. HOME CARE INSTRUCTIONS  For the first 24 hours after general anesthesia:  Have a responsible person with you.  Do not drive a car. If you are alone, do not take public transportation.  Do not drink alcohol.  Do not take medicine that has not been prescribed by your health care provider.  Do not sign important papers or make important decisions.  You may resume a normal diet and activities as directed by your health care provider.  Change bandages (dressings) as directed.  If you have questions or problems that seem related to general anesthesia, call the hospital and ask for the anesthetist or anesthesiologist on call. SEEK MEDICAL CARE IF:  You have nausea and vomiting that continue the day after anesthesia.  You develop a rash. SEEK IMMEDIATE MEDICAL CARE IF:  You have difficulty breathing.  You have chest pain.  You have any allergic problems. Document Released: 08/30/2000 Document Revised: 01/24/2013 Document Reviewed: 12/07/2012  [facility] Patient Information 2014 [place], [place].

## 2014-04-17 NOTE — Telephone Encounter (Signed)
Called patient to schedule for thoracic clinic.  Patient was asleep and gave appt to husband.  He verbalized understanding of appt time and place. 04/25/14 at 1:30

## 2014-04-17 NOTE — Anesthesia Postprocedure Evaluation (Signed)
  Anesthesia Post-op Note  Patient: [name] [name]  Procedure(s) Performed: Procedure(s): VIDEO BRONCHOSCOPY WITH ENDOBRONCHIAL ULTRASOUND (Right)  Patient Location: PACU  Anesthesia Type:General  Level of Consciousness: awake  Airway and Oxygen Therapy: Patient Spontanous Breathing  Post-op Pain: mild  Post-op Assessment: Post-op Vital signs reviewed  Post-op Vital Signs: Reviewed  Last Vitals:  Filed Vitals:   04/17/14 1403  BP: 120/66  Pulse:   Temp:   Resp:     Complications: No apparent anesthesia complications

## 2014-04-17 NOTE — Telephone Encounter (Signed)
i need an order for [facility] thanks Joellen Jersey

## 2014-04-17 NOTE — Anesthesia Procedure Notes (Signed)
Procedure Name: Intubation Date/Time: 04/17/2014 10:42 AM Performed by: Octavio Graves Pre-anesthesia Checklist: Patient identified, Timeout performed, Emergency Drugs available, Suction available and Patient being monitored Patient Re-evaluated:Patient Re-evaluated prior to inductionOxygen Delivery Method: Circle system utilized Preoxygenation: Pre-oxygenation with 100% oxygen Intubation Type: IV induction Ventilation: Mask ventilation without difficulty Laryngoscope Size: Miller and 2 Grade View: Grade I Tube type: Oral Number of attempts: 1 Airway Equipment and Method: Stylet Placement Confirmation: ETT inserted through vocal cords under direct vision,  breath sounds checked- equal and bilateral and positive ETCO2 Secured at: 22 cm Tube secured with: Tape Dental Injury: Teeth and Oropharynx as per pre-operative assessment  Comments: IV induction Moser- intubation AM CRNA- atraumatic- teeth as preop- irregular surfaces on front teeth with right being greater then left- bilat BS [employer]

## 2014-04-17 NOTE — Interval H&P Note (Signed)
PCCM Interval Note  Patient presents for thyroidectomy bronchoscopy with endobronchial ultrasound and biopsies. She hasn't experienced any more hemoptysis. Her CT scan of the chest was performed on 04/11/14 that did not show any residual atelectasis but did reveal a very medial and posterior right lower lobe mass with associated subcarinal lymphadenopathy. Preoperative screening she was noted to have a prolonged QT-c on her EKG. Consultation was had with Dr. Marlou Porch in Cardiology and it was determined that the patient was appropriate to proceed under general anesthesia today.   Filed Vitals:   04/17/14 0856  BP: 153/64  Pulse: 97  Temp: 97.6 F (36.4 C)  TempSrc: Oral  Resp: 20  Height: 5' 5.75" (1.67 m)  Weight: 73.029 kg (161 lb)  SpO2: 96%   Gen: Pleasant, well-nourished, in no distress,  normal affect  ENT: No lesions,  mouth clear,  oropharynx clear, no postnasal drip  Neck: No JVD, no TMG, no carotid bruits  Lungs: No use of accessory muscles, clear without rales or rhonchi  Cardiovascular: RRR, heart sounds normal, no murmur or gallops, no peripheral edema  Musculoskeletal: No deformities, no cyanosis or clubbing  Neuro: alert, non focal  Skin: Warm, no lesions or rashes   04/11/14 CT Chest --  COMPARISON: Radiograph 02/15/2014  FINDINGS: No axillary or supraclavicular lymphadenopathy. Enlarged subacromial lymph node measures 17 mm short axis.  Rounded pulmonary mass in the esophageal recess measuring 3.3 x 2.8 cm (image 32 from series 3) has central low attenuation peripheral enhancement on series 2. There is right lower lobe mass abuts the theright lower lobe bronchus.  No additional pulmonary nodules are present.  Limited view of the upper abdomen demonstrates normal adrenal glands. Low-density lesion in the left hepatic lobe has simple fluid attenuation consistent with a benign cyst.  No aggressive osseous lesion.  IMPRESSION: 1. 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 2. Mass abuts the right lower lobe bronchus is likely the source of hemoptysis. 3. Subcarinal enlarged lymph node is concerning for ipsilateral nodal metastasis  Plans:  1. Will proceed with bronchoscopy, endobronchial ultrasound, and biopsies. 2. She will be referred to see Dr. Tamala Julian with cardiology as an outpatient regarding her PVCs and prolonged QT-c  Baltazar Apo, MD, PhD 04/17/2014, 10:20 AM [facility] [phone] or if no answer [phone]

## 2014-04-17 NOTE — Telephone Encounter (Signed)
Received call from [facility]:  Did EBUS on pt this morning.  Getting ready to be dc'd from PACU but pt is nauseas and nurse is requesting rx for phenergan.  Per RB, okay to call in phenergan 12.5mg  #20, 1 po q6h prn nausea.  Called PACU, spoke with pt's nurse Ivin Booty to inform her that [place] the rx.  Ivin Booty was able to obtain pt's pharmacy >> [facility].  Rx sent to pharmacy.  Nothing further needed; will sign off.

## 2014-04-17 NOTE — Transfer of Care (Signed)
Immediate Anesthesia Transfer of Care Note  Patient: [name] [name]  Procedure(s) Performed: Procedure(s): VIDEO BRONCHOSCOPY WITH ENDOBRONCHIAL ULTRASOUND (Right)  Patient Location: PACU  Anesthesia Type:General  Level of Consciousness: awake and alert   Airway & Oxygen Therapy: Patient Spontanous Breathing and Patient connected to face mask oxygen  Post-op Assessment: Report given to PACU RN and Post -op Vital signs reviewed and stable  Post vital signs: Reviewed and stable  Complications: No apparent anesthesia complications

## 2014-04-17 NOTE — Telephone Encounter (Signed)
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

## 2014-04-18 ENCOUNTER — Encounter (HOSPITAL_COMMUNITY): Payer: Self-pay | Admitting: Emergency Medicine

## 2014-04-18 NOTE — Telephone Encounter (Signed)
Referral entered.  Nothing further needed.

## 2014-04-18 NOTE — Telephone Encounter (Signed)
Done

## 2014-04-19 ENCOUNTER — Telehealth: Payer: Self-pay | Admitting: Emergency Medicine

## 2014-04-19 NOTE — Telephone Encounter (Signed)
Reviewed results with the patient by phone. She ordered has consult arranged with Dr. Julien Nordmann in the PET scan for next week. The diagnosis was no carcinoma so I have sent the tissue for molecular studies. The pathology about this today

## 2014-04-23 ENCOUNTER — Ambulatory Visit (HOSPITAL_COMMUNITY)
Admission: RE | Admit: 2014-04-23 | Discharge: 2014-04-23 | Disposition: A | Discharge status: Home / Self Care | Payer: Managed Care, Other (non HMO) | Source: Ambulatory Visit | Attending: Emergency Medicine | Admitting: Emergency Medicine

## 2014-04-23 DIAGNOSIS — C3491 Malignant neoplasm of unspecified part of right bronchus or lung: Secondary | ICD-10-CM

## 2014-04-23 DIAGNOSIS — C3490 Malignant neoplasm of unspecified part of unspecified bronchus or lung: Secondary | ICD-10-CM | POA: Diagnosis present

## 2014-04-23 DIAGNOSIS — C3431 Malignant neoplasm of lower lobe, right bronchus or lung: Secondary | ICD-10-CM | POA: Insufficient documentation

## 2014-04-23 LAB — GLUCOSE, CAPILLARY: Glucose-Capillary: 119 mg/dL — ABNORMAL HIGH (ref 70–99)

## 2014-04-23 MED ORDER — FLUDEOXYGLUCOSE F - 18 (FDG) INJECTION
8.0000 | Freq: Once | INTRAVENOUS | Status: AC | PRN
  Administered 2014-04-23: 8 via INTRAVENOUS

## 2014-04-25 ENCOUNTER — Encounter: Payer: Self-pay | Admitting: Physical Therapy

## 2014-04-25 ENCOUNTER — Encounter: Payer: Self-pay | Admitting: Internal Medicine

## 2014-04-25 ENCOUNTER — Ambulatory Visit (INDEPENDENT_AMBULATORY_CARE_PROVIDER_SITE_OTHER): Payer: Managed Care, Other (non HMO) | Admitting: Interventional Cardiology

## 2014-04-25 ENCOUNTER — Encounter: Payer: Self-pay | Admitting: Interventional Cardiology

## 2014-04-25 ENCOUNTER — Telehealth: Payer: Self-pay

## 2014-04-25 ENCOUNTER — Ambulatory Visit: Payer: Managed Care, Other (non HMO) | Admitting: Interventional Cardiology

## 2014-04-25 ENCOUNTER — Encounter: Payer: Self-pay | Admitting: *Deleted

## 2014-04-25 ENCOUNTER — Ambulatory Visit: Payer: Managed Care, Other (non HMO) | Attending: Internal Medicine | Admitting: Physical Therapy

## 2014-04-25 ENCOUNTER — Other Ambulatory Visit (HOSPITAL_BASED_OUTPATIENT_CLINIC_OR_DEPARTMENT_OTHER): Payer: Managed Care, Other (non HMO)

## 2014-04-25 ENCOUNTER — Telehealth: Payer: Self-pay | Admitting: Internal Medicine

## 2014-04-25 ENCOUNTER — Ambulatory Visit (INDEPENDENT_AMBULATORY_CARE_PROVIDER_SITE_OTHER): Payer: Managed Care, Other (non HMO) | Admitting: Cardiothoracic Surgery

## 2014-04-25 ENCOUNTER — Ambulatory Visit
Admission: RE | Admit: 2014-04-25 | Discharge: 2014-04-25 | Disposition: A | Discharge status: Home / Self Care | Payer: Managed Care, Other (non HMO) | Source: Ambulatory Visit | Attending: Radiation Oncology | Admitting: Radiation Oncology

## 2014-04-25 ENCOUNTER — Ambulatory Visit (HOSPITAL_BASED_OUTPATIENT_CLINIC_OR_DEPARTMENT_OTHER): Payer: Managed Care, Other (non HMO) | Admitting: Internal Medicine

## 2014-04-25 VITALS — BP 148/90 | HR 84 | Ht 65.0 in | Wt 160.0 lb

## 2014-04-25 VITALS — BP 162/91 | HR 87 | Temp 98.6°F | Resp 19 | Ht 65.0 in | Wt 161.1 lb

## 2014-04-25 DIAGNOSIS — Z5189 Encounter for other specified aftercare: Secondary | ICD-10-CM | POA: Insufficient documentation

## 2014-04-25 DIAGNOSIS — R599 Enlarged lymph nodes, unspecified: Secondary | ICD-10-CM

## 2014-04-25 DIAGNOSIS — R590 Localized enlarged lymph nodes: Secondary | ICD-10-CM

## 2014-04-25 DIAGNOSIS — R918 Other nonspecific abnormal finding of lung field: Secondary | ICD-10-CM

## 2014-04-25 DIAGNOSIS — C3431 Malignant neoplasm of lower lobe, right bronchus or lung: Secondary | ICD-10-CM

## 2014-04-25 DIAGNOSIS — M2560 Stiffness of unspecified joint, not elsewhere classified: Secondary | ICD-10-CM | POA: Insufficient documentation

## 2014-04-25 DIAGNOSIS — J449 Chronic obstructive pulmonary disease, unspecified: Secondary | ICD-10-CM

## 2014-04-25 DIAGNOSIS — I10 Essential (primary) hypertension: Secondary | ICD-10-CM

## 2014-04-25 DIAGNOSIS — I493 Ventricular premature depolarization: Secondary | ICD-10-CM

## 2014-04-25 HISTORY — DX: Ventricular premature depolarization: I49.3

## 2014-04-25 LAB — CBC WITH DIFFERENTIAL/PLATELET
BASO%: 0.9 % (ref 0.0–2.0)
Basophils Absolute: 0.1 10*3/uL (ref 0.0–0.1)
EOS%: 1.9 % (ref 0.0–7.0)
Eosinophils Absolute: 0.1 10*3/uL (ref 0.0–0.5)
HEMATOCRIT: 41.9 % (ref 34.8–46.6)
HGB: 13.4 g/dL (ref 11.6–15.9)
LYMPH%: 20.4 % (ref 14.0–49.7)
MCH: 28.9 pg (ref 25.1–34.0)
MCHC: 31.9 g/dL (ref 31.5–36.0)
MCV: 90.5 fL (ref 79.5–101.0)
MONO#: 0.6 10*3/uL (ref 0.1–0.9)
MONO%: 7.7 % (ref 0.0–14.0)
NEUT%: 69.1 % (ref 38.4–76.8)
NEUTROS ABS: 5.4 10*3/uL (ref 1.5–6.5)
PLATELETS: 292 10*3/uL (ref 145–400)
RBC: 4.63 10*6/uL (ref 3.70–5.45)
RDW: 13.2 % (ref 11.2–14.5)
WBC: 7.9 10*3/uL (ref 3.9–10.3)
lymph#: 1.6 10*3/uL (ref 0.9–3.3)

## 2014-04-25 LAB — COMPREHENSIVE METABOLIC PANEL (CC13)
ALT: 13 U/L (ref 0–55)
ANION GAP: 7 meq/L (ref 3–11)
AST: 12 U/L (ref 5–34)
Albumin: 3.8 g/dL (ref 3.5–5.0)
Alkaline Phosphatase: 99 U/L (ref 40–150)
BUN: 10.5 mg/dL (ref 7.0–26.0)
CO2: 26 meq/L (ref 22–29)
CREATININE: 0.8 mg/dL (ref 0.6–1.1)
Calcium: 9.7 mg/dL (ref 8.4–10.4)
Chloride: 109 mEq/L (ref 98–109)
GLUCOSE: 121 mg/dL (ref 70–140)
Potassium: 4.3 mEq/L (ref 3.5–5.1)
Sodium: 142 mEq/L (ref 136–145)
Total Bilirubin: 0.59 mg/dL (ref 0.20–1.20)
Total Protein: 7.1 g/dL (ref 6.4–8.3)

## 2014-04-25 NOTE — Progress Notes (Signed)
[facility] [phone]   [phone]  CONSULT NOTE  REFERRING PHYSICIAN: Dr. Baltazar Apo  REASON FOR CONSULTATION:  58 years old white female recently diagnosed with lung cancer.  HPI [name] [name] is a 58 y.o. female with past medical history significant for migraine headache followed by Dr. Jannifer Franklin, COPD, vitamin D deficiency, hypertension and long history of smoking. 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 She has been complaining of chronic dry cough for around 2 years. Her symptoms were getting worse. She was treated for COPD with inhaler and antibiotics but it was not helping. CT scan of the chest with contrast performed on 04/07/2014 showed rounded pulmonary mass in the esophageal recess measuring 3.3 x 2.8 cm with central low-attenuation peripheral enhancement. The right lower lobe mass abuts the right lower lobe bronchus, concerning for bronchogenic carcinoma. There was also subcarinal enlarged lymph node concerning for ipsilateral nodal metastasis. The patient was referred to Dr. Lamonte Sakai and on 04/17/2014 she underwent Flexible video fiberoptic bronchoscopy with endobronchial ultrasound and biopsies.  The final pathology (Accession: [id number]) showed invasive adenocarcinoma. The tissue block was sent for molecular studies.  On 04/23/2014 the patient had a PET scan performed that showed right lower lobe lung mass identified and is intensely hypermetabolic compatible with primary bronchogenic carcinoma. There was also hypermetabolic subcarinal lymph node measuring 1.5 cm and has an SUV max of 6.6. Dr. Lamonte Sakai kindly referred the patient to me today for evaluation and recommendation regarding treatment of her condition. When seen today the patient is feeling well and breathing much better after quitting smoking and is starting treatment with Flonase and Claritin. She denied having any current hemoptysis. The patient  denied having any significant chest pain. She has no weight loss or night sweats. She denied having any visual changes or headache. Family history significant for father with atrial fibrillation and myocardial infarction. Mother had cancer of the small intestine. The patient is married and has 1 daughter age 20. She was accompanied today by her husband Merry Proud who was recently diagnosed with a stage IV colon cancer. The patient is a retired [employer] scientist. She has a history of smoking half pack per day for around 40 years and quit in October 2015. She has no history of alcohol or drug abuse.   HPI  Past Medical History  Diagnosis Date  . Visual disturbance     Episodic  . History of migraine   . Hypertension   . Thyroid nodule   . Varicose veins     left leg  . Vitamin D deficiency   . Congenital renal atrophy     left kidney  . Migraine equivalent 11/09/2012  . Degenerative arthritis   . Shortness of breath dyspnea     with exertion  . Wears glasses   . COPD (chronic obstructive pulmonary disease)   . GERD (gastroesophageal reflux disease)     Past Surgical History  Procedure Laterality Date  . Tonsillectomy    . Cesarean section    . Carpal tunnel release Left   . Colonoscopy w/ biopsies and polypectomy    . Dilation and curettage of uterus    . Video bronchoscopy with endobronchial ultrasound Right 04/17/2014    Procedure: VIDEO BRONCHOSCOPY WITH ENDOBRONCHIAL ULTRASOUND;  Surgeon: Collene Gobble, MD;  Location: [facility];  Service: Thoracic;  Laterality: Right;    Family History  Problem Relation Age of Onset  . Cancer Mother  cancer of the small intestines  . Hypertension Father   . Atrial fibrillation Father   . Diverticulitis Father   . Hypertension Sister   . Diverticulitis Brother   . Hypertension Sister   . Headache Sister     Social History History  Substance Use Topics  . Smoking status: Former Smoker -- 0.50 packs/day for 40 years    Types: Cigarettes    Quit  date: 03/17/2013  . Smokeless tobacco: Never Used  . Alcohol Use: 0.0 oz/week    0 Not specified per week     Comment: " rare"    Allergies  Allergen Reactions  . Hydrocodone-Guaifenesin Itching  . Sulfa Antibiotics Hives and Swelling  . Plasticized Base [Plastibase] Rash  . Zofran [Ondansetron Hcl] Palpitations    Current Outpatient Prescriptions  Medication Sig Dispense Refill  . Calcium-Vitamin D-Vitamin K 500-100-40 MG-UNT-MCG CHEW Chew 1 tablet by mouth 2 (two) times daily.     . Cyanocobalamin (VITAMIN B-12) 2500 MCG SUBL Place 1 tablet under the tongue daily.    . fluticasone (FLONASE) 50 MCG/ACT nasal spray Place 2 sprays into both nostrils 2 (two) times daily. 16 g 1  . loratadine (CLARITIN) 10 MG tablet Take 1 tablet (10 mg total) by mouth daily. 30 tablet 1  . Multiple Vitamins-Minerals (MULTIVITAMIN WITH MINERALS) tablet Take 1 tablet by mouth daily.    [name] Kitchen omeprazole (PRILOSEC) 20 MG capsule Take 1 capsule (20 mg total) by mouth 2 (two) times daily before a meal. 60 capsule 1  . predniSONE (DELTASONE) 20 MG tablet Take 2 tablets (40 mg total) by mouth daily with breakfast. 4 tablet 0  . Probiotic Product (ALIGN PO) Take 1 tablet by mouth daily.    . promethazine (PHENERGAN) 12.5 MG tablet Take 1 tablet (12.5 mg total) by mouth every 6 (six) hours as needed for nausea or vomiting. 20 tablet 0  . verapamil (CALAN-SR) 180 MG CR tablet Take 180 mg by mouth at bedtime.    . Vitamin D, Ergocalciferol, (DRISDOL) 50000 UNITS CAPS capsule Take 50,000 Units by mouth. Every other week on mondays  1   No current facility-administered medications for this visit.    Review of Systems  Constitutional: negative Eyes: negative Ears, nose, mouth, throat, and face: negative Respiratory: positive for cough and dyspnea on exertion Cardiovascular: negative Gastrointestinal: negative Genitourinary:negative Integument/breast: negative Hematologic/lymphatic:  negative Musculoskeletal:negative Neurological: negative Behavioral/Psych: negative Endocrine: negative Allergic/Immunologic: negative  Physical Exam  NAT:FTDDU, healthy, no distress, well nourished and well developed SKIN: skin color, texture, turgor are normal, no rashes or significant lesions HEAD: Normocephalic, No masses, lesions, tenderness or abnormalities EYES: normal, PERRLA EARS: External ears normal, Canals clear OROPHARYNX:no exudate, no erythema and lips, buccal mucosa, and tongue normal  NECK: supple, no adenopathy, no JVD LYMPH:  no palpable lymphadenopathy, no hepatosplenomegaly BREAST:not examined LUNGS: clear to auscultation , and palpation HEART: regular rate & rhythm and no murmurs ABDOMEN:abdomen soft, non-tender, normal bowel sounds and no masses or organomegaly BACK: Back symmetric, no curvature., No CVA tenderness EXTREMITIES:no joint deformities, effusion, or inflammation, no edema, no skin discoloration, no clubbing  NEURO: alert & oriented x 3 with fluent speech, no focal motor/sensory deficits  PERFORMANCE STATUS: ECOG 1  LABORATORY DATA: Lab Results  Component Value Date   WBC 7.9 04/25/2014   HGB 13.4 04/25/2014   HCT 41.9 04/25/2014   MCV 90.5 04/25/2014   PLT 292 04/25/2014      Chemistry      Component Value Date/Time  NA 142 04/25/2014 1346   NA 141 04/15/2014 1107   K 4.3 04/25/2014 1346   K 4.4 04/15/2014 1107   CL 103 04/15/2014 1107   CO2 26 04/25/2014 1346   CO2 25 04/15/2014 1107   BUN 10.5 04/25/2014 1346   BUN 14 04/15/2014 1107   CREATININE 0.8 04/25/2014 1346   CREATININE 0.65 04/15/2014 1107      Component Value Date/Time   CALCIUM 9.7 04/25/2014 1346   CALCIUM 9.5 04/15/2014 1107   ALKPHOS 99 04/25/2014 1346   AST 12 04/25/2014 1346   ALT 13 04/25/2014 1346   BILITOT 0.59 04/25/2014 1346       RADIOGRAPHIC STUDIES: Ct Chest W Contrast  04/11/2014   CLINICAL DATA:  Intermittent hemoptysis, shortness of  Breath, Strong smoking history  EXAM: CT CHEST WITH CONTRAST  TECHNIQUE: Multidetector CT imaging of the chest was performed during intravenous contrast administration.  CONTRAST:  42mL OMNIPAQUE IOHEXOL 300 MG/ML  SOLN  COMPARISON:  Radiograph 02/15/2014  FINDINGS: No axillary or supraclavicular lymphadenopathy. Enlarged subacromial lymph node measures 17 mm short axis.  Rounded pulmonary mass in the esophageal recess measuring 3.3 x 2.8 cm (image 32 from series 3) has central low attenuation peripheral enhancement on series 2. There is right lower lobe mass abuts the theright lower lobe bronchus.  No additional pulmonary nodules are present.  Limited view of the upper abdomen demonstrates normal adrenal glands. Low-density lesion in the left hepatic lobe has simple fluid attenuation consistent with a benign cyst.  No aggressive osseous lesion.  IMPRESSION: 1. 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 2. Mass abuts the right lower lobe bronchus is likely the source of hemoptysis. 3. Subcarinal enlarged lymph node is concerning for ipsilateral nodal metastasis. These results will be called to the ordering clinician or representative by the Radiologist Assistant, and communication documented in the PACS or zVision Dashboard.   Electronically Signed   By: Suzy Bouchard M.D.   On: 04/11/2014 15:50   Nm Pet Image Initial (pi) Skull Base To Thigh  04/23/2014   CLINICAL DATA:  Initial treatment strategy for non-small cell lung cancer.  EXAM: NUCLEAR MEDICINE PET SKULL BASE TO THIGH  TECHNIQUE: 8.0 mCi F-18 FDG was injected intravenously. Full-ring PET imaging was performed from the skull base to thigh after the radiotracer. CT data was obtained and used for attenuation correction and anatomic localization.  FASTING BLOOD GLUCOSE:  Value: 19 mg/dl  COMPARISON:  CT 04/11/2014.  FINDINGS: NECK  No hypermetabolic lymph nodes in the neck.  CHEST   Right lower lobe infrahilar mass measures 3.7 x 2.4 x 2.3 cm. The SUV max is equal to 9.2. Hypermetabolic sub- carinal lymph node measures 1.5 cm and has an SUV max equal to 6.6.  No pleural effusion identified.  ABDOMEN/PELVIS  No abnormal hypermetabolic activity within the liver, pancreas, adrenal glands, or spleen. No hypermetabolic lymph nodes in the abdomen or pelvis. Solitary left kidney.  SKELETON  No focal hypermetabolic activity to suggest skeletal metastasis.  IMPRESSION: 1. Right lower lobe lung mass is identified and is intensely hypermetabolic compatible with primary bronchogenic carcinoma. This is considered a T2aN2M0 tumor or stage IIIa.   Electronically Signed   By: Kerby Moors M.D.   On: 04/23/2014 15:56    ASSESSMENT: This is a very pleasant 58 years old white female recently diagnosed with a stage IIIA (T2a, N2, M0) non-small cell lung cancer, adenocarcinoma presenting with right  lower lobe lung mass in addition to mediastinal lymphadenopathy and diagnosed in November 2015.   PLAN: I had a lengthy discussion with the patient and her husband today about her current disease is stage, prognosis and treatment options. I will complete the staging workup by ordering a MRI of the brain to rule out brain metastasis. I recommended for the patient treatment with a course of concurrent chemoradiation with weekly carboplatin for AUC of 2 and paclitaxel 45 MG/M2. This will be followed by restaging scan and evaluation for surgical resection if she has good response to the induction treatment. I discussed with the patient adverse effect of the chemotherapy including but not limited to alopecia, myelosuppression, nausea and vomiting, peripheral neuropathy, liver or renal dysfunction. I will arrange for the patient to have a chemotherapy education class before starting the first cycle of her treatment. I will call her pharmacy was prescription for Compazine 10 mg by mouth every 6 hours as needed for  nausea. The patient will be seen later today by Dr. Tammi Klippel for discussion of the radiotherapy option as well as Dr. Prescott Gum for discussion of the surgical option after the induction treatment. The patient is expected to start the first cycle of this treatment on 05/06/2014. She would come back for follow-up visit in 3 weeks for reevaluation and management of any adverse effect of her treatment. The patient was seen during the multidisciplinary thoracic oncology clinic today by medical oncology, radiation oncology, thoracic surgery, thoracic navigator, social worker as well as physical therapy. She was advised to call immediately if she has any concerning symptoms in the interval.  The patient voices understanding of current disease status and treatment options and is in agreement with the current care plan.  All questions were answered. The patient knows to call the clinic with any problems, questions or concerns. We can certainly see the patient much sooner if necessary.  Thank you so much for allowing me to participate in the care of [name] [name]. I will continue to follow up the patient with you and assist in her care.  I spent 55 minutes counseling the patient face to face. The total time spent in the appointment was 80 minutes.  Disclaimer: This note was dictated with voice recognition software. Similar sounding words can inadvertently be transcribed and may not be corrected upon review.   Gracey Tolle K. 04/25/2014, 3:15 PM

## 2014-04-25 NOTE — Telephone Encounter (Signed)
Returned pt call. Adv her to keep appt sch today with Dr.Smith for 4pm.adv her that it was ok if she is running a little late.pt is to call the office if she thinks she will not be able to make it so that we can reschedule her for Monday.pt verbalized understanding.

## 2014-04-25 NOTE — Progress Notes (Signed)
PCP is Cloyd Stagers, MD Referring Provider is [name], Ibt* Patient examined, CT scan of chest and PET scan reviewed Patient discussed with Dr. 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 The patient had been experiencing a persistent cough this fall which did not respond to usual measures. She was referred to pulmonology and a CT scan was performed which showed a 3 cm right lower lobe mass. The patient had a PET scan which showed this mass to be hypermetabolic as well as hypermetabolic subcarinal lymph nodes measuring 1.9 cm. No other evidence of distant metastatic disease on PET scan. Bronchoscopy with biopsy demonstrated adenocarcinoma right lobe. Brain MRI is pending. The patient was discussed at the multi-disciplinary thoracic oncology conference. He was felt she had clinical stage III a lung cancer. It was recommended that she undergo chemotherapy with radiation and then restaging after 6 weeks of therapy to determine if she was a candidate for surgical resection.  The patient denies any systemic symptoms weight loss. Her cough is improved after bronchoscopy. She denies any headache. She denies shortness of breath with exertion. She is scheduled undergo PFTs next week.  The patient has history of palpitations and on EKG he has a prolonged QT interval. She is scheduled to see her cardiologist, Dr. Daneen Schick later next week.  Past Medical History  Diagnosis Date  . Visual disturbance     Episodic  . History of migraine   . Hypertension   . Thyroid nodule   . Varicose veins     left leg  . Vitamin D deficiency   . Congenital renal atrophy     left kidney  . Migraine equivalent 11/09/2012  . Degenerative arthritis   . Shortness of breath dyspnea     with exertion  . Wears glasses   . COPD (chronic  obstructive pulmonary disease)   . GERD (gastroesophageal reflux disease)     Past Surgical History  Procedure Laterality Date  . Tonsillectomy    . Cesarean section    . Carpal tunnel release Left   . Colonoscopy w/ biopsies and polypectomy    . Dilation and curettage of uterus    . Video bronchoscopy with endobronchial ultrasound Right 04/17/2014    Procedure: VIDEO BRONCHOSCOPY WITH ENDOBRONCHIAL ULTRASOUND;  Surgeon: Collene Gobble, MD;  Location: [facility];  Service: Thoracic;  Laterality: Right;    Family History  Problem Relation Age of Onset  . Cancer Mother     cancer of the small intestines  . Hypertension Father   . Atrial fibrillation Father   . Diverticulitis Father   . Hypertension Sister   . Diverticulitis Brother   . Hypertension Sister   . Headache Sister     Social History History  Substance Use Topics  . Smoking status: Former Smoker -- 0.50 packs/day for 40 years    Types: Cigarettes    Quit date: 03/17/2013  . Smokeless tobacco: Never Used  . Alcohol Use: 0.0 oz/week    0 Not specified per week     Comment: " rare"    Current Outpatient Prescriptions  Medication Sig Dispense Refill  . fluticasone (FLONASE) 50 MCG/ACT nasal spray Place 2 sprays into both nostrils 2 (two) times daily. 16 g 1  . loratadine (CLARITIN) 10 MG tablet Take 1 tablet (10 mg total) by mouth daily. 30 tablet 1  .  omeprazole (PRILOSEC) 20 MG capsule Take 1 capsule (20 mg total) by mouth 2 (two) times daily before a meal. 60 capsule 1  . verapamil (CALAN-SR) 180 MG CR tablet Take 180 mg by mouth at bedtime.    . Vitamin D, Ergocalciferol, (DRISDOL) 50000 UNITS CAPS capsule Take 50,000 Units by mouth. Every other week on mondays  1   No current facility-administered medications for this visit.    Allergies  Allergen Reactions  . Advil [Ibuprofen] Other (See Comments)    Causes hematuria  . Quinolones Palpitations    Prolonged QT  . Hydrocodone-Guaifenesin Itching  . Sulfa  Antibiotics Hives and Swelling  . Plasticized Base [Plastibase] Rash  . Zofran [Ondansetron Hcl] Palpitations    Review of Systems  The patient's husband recently underwent colectomy for adenocarcinoma of the colon The patient is right-hand dominant The patient has had previous knee surgery and hysterectomy without anesthetic or bleeding complications Patient states she cannot take quinolones because of her prolonged QT syndrome Patient denies diabetes There's no family history of carcinoma lung  There were no vitals taken for this visit. Physical Exam Gen. Appearance-alert and pleasant middle-aged female no acute distress accompanied by her husband HEENT normocephalic pupils equal dentition good Neck without JVD mass or adenopathy Lungs clear breath sounds bilaterally Heart regular rhythm without murmur or gallop Abdomen soft nontender without pulsatile mass organomegaly or ascites Extremities without cyanosis but with mild clubbing, no tenderness or edema Vascular palpable pulses in all extremities Neurologic alert and oriented without focal motor deficit  Diagnostic Tests: CT scan PET scan and pathology results of bronchoscopy were reviewed with the patient  Impression: Stage IIIa non-small cell carcinoma right lower lobe  Plan:patient is potentially a candidate for surgical resection after up front chemoradiation and then rescanning. We'll plan on seeing the patient in 6-8 weeks to review her scans and discuss possible lobectomy with lymph node dissection.

## 2014-04-25 NOTE — Progress Notes (Signed)
Radiation Oncology         (336) [phone] ________________________________  Multidisciplinary Thoracic Oncology Clinic [facility]) Initial Outpatient Consultation  Name: [name] [name] MRN: [id number]  Date: 04/25/2014  DOB: [birth year]  JS:HFWYOVZ SHAMLEFFER, Mammie Lorenzo, MD  Collene Gobble, MD   REFERRING PHYSICIAN: Collene Gobble, MD  DIAGNOSIS: 58 year old woman with Stage IIIA (T2a, N2, M0) adenocarcinoma of the right lower lobe of the lung    ICD-9-CM ICD-10-CM   1. 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  CT scan was performed which showed a 3 cm right lower lobe mass. The patient had a PET scan which showed this mass to be hypermetabolic as well as hypermetabolic subcarinal lymph nodes measuring 1.9 cm. No other evidence of distant metastatic disease on PET scan. Bronchoscopy with biopsy demonstrated adenocarcinoma right lobe.  PREVIOUS RADIATION THERAPY: No  PAST MEDICAL HISTORY:  has a past medical history of Visual disturbance; History of migraine; Hypertension; Thyroid nodule; Varicose veins; Vitamin D deficiency; Congenital renal atrophy; Migraine equivalent (11/09/2012); Degenerative arthritis; Shortness of breath dyspnea; Wears glasses; COPD (chronic obstructive pulmonary disease); and GERD (gastroesophageal reflux disease).    PAST SURGICAL HISTORY: Past Surgical History  Procedure Laterality Date  . Tonsillectomy    . Cesarean section    . Carpal tunnel release Left   . Colonoscopy w/ biopsies and polypectomy    . Dilation and curettage of uterus    . Video bronchoscopy with endobronchial ultrasound Right 04/17/2014    Procedure: VIDEO BRONCHOSCOPY WITH ENDOBRONCHIAL  ULTRASOUND;  Surgeon: Collene Gobble, MD;  Location: [place];  Service: Thoracic;  Laterality: Right;    FAMILY HISTORY: family history includes Atrial fibrillation in her father; Cancer in her mother; Diverticulitis in her brother and father; Headache in her sister; Hypertension in her father, sister, and sister.  SOCIAL HISTORY:  reports that she quit smoking about 13 months ago. Her smoking use included Cigarettes. She has a 20 pack-year smoking history. She has never used smokeless tobacco. She reports that she drinks alcohol. She reports that she does not use illicit drugs.  ALLERGIES: Advil; Quinolones; Hydrocodone-guaifenesin; Sulfa antibiotics; Plasticized base; and Zofran  MEDICATIONS:  Current Outpatient Prescriptions  Medication Sig Dispense Refill  . fluticasone (FLONASE) 50 MCG/ACT nasal spray Place 2 sprays into both nostrils 2 (two) times daily. 16 g 1  . loratadine (CLARITIN) 10 MG tablet Take 1 tablet (10 mg total) by mouth daily. 30 tablet 1  . omeprazole (PRILOSEC) 20 MG capsule Take 1 capsule (20 mg total) by mouth 2 (two) times daily before a meal. 60 capsule 1  . verapamil (CALAN-SR) 180 MG CR tablet Take 180 mg by mouth at bedtime.    . Vitamin D, Ergocalciferol, (DRISDOL) 50000 UNITS CAPS capsule Take 50,000 Units by mouth. Every other week on mondays  1   No current facility-administered medications for this encounter.    REVIEW OF SYSTEMS:  A 15 point review of systems is documented in the electronic medical record. This was obtained by the nursing staff. However, I reviewed this with the patient to discuss relevant findings and make appropriate changes.  Pertinent items are noted in HPI.  PHYSICAL EXAM:   Per thoracic surgery: Gen. Appearance-alert and pleasant middle-aged female no acute distress accompanied by her husband HEENT normocephalic pupils equal dentition good Neck without JVD mass or adenopathy Lungs clear breath sounds bilaterally Heart regular  rhythm without murmur or gallop Abdomen soft nontender without pulsatile mass organomegaly or ascites Extremities without cyanosis but with mild clubbing, no tenderness or edema Vascular palpable pulses in all extremities Neurologic alert and oriented without focal motor deficit  KPS = 90  100 - Normal; no complaints; no evidence of disease. 90   - Able to carry on normal activity; minor signs or symptoms of disease. 80   - Normal activity with effort; some signs or symptoms of disease. 62   - Cares for self; unable to carry on normal activity or to do active work. 60   - Requires occasional assistance, but is able to care for most of his personal needs. 50   - Requires considerable assistance and frequent medical care. 6   - Disabled; requires special care and assistance. 62   - Severely disabled; hospital admission is indicated although death not imminent. 55   - Very sick; hospital admission necessary; active supportive treatment necessary. 10   - Moribund; fatal processes progressing rapidly. 0     - Dead  Karnofsky DA, [place], Craver LS and [place] JH [phone]) The use of the nitrogen mustards in the palliative treatment of carcinoma: with particular reference to bronchogenic carcinoma Cancer 1 634-56  LABORATORY DATA:  Lab Results  Component Value Date   WBC 7.9 04/25/2014   HGB 13.4 04/25/2014   HCT 41.9 04/25/2014   MCV 90.5 04/25/2014   PLT 292 04/25/2014   Lab Results  Component Value Date   NA 142 04/25/2014   K 4.3 04/25/2014   CL 103 04/15/2014   CO2 26 04/25/2014   Lab Results  Component Value Date   ALT 13 04/25/2014   AST 12 04/25/2014   ALKPHOS 99 04/25/2014   BILITOT 0.59 04/25/2014    PULMONARY FUNCTION TEST:  pending   RADIOGRAPHY: Ct Chest W Contrast  04/11/2014   CLINICAL DATA:  Intermittent hemoptysis, shortness of Breath, Strong smoking history  EXAM: CT CHEST WITH CONTRAST  TECHNIQUE: Multidetector CT imaging of the chest was performed  during intravenous contrast administration.  CONTRAST:  52mL OMNIPAQUE IOHEXOL 300 MG/ML  SOLN  COMPARISON:  Radiograph 02/15/2014  FINDINGS: No axillary or supraclavicular lymphadenopathy. Enlarged subacromial lymph node measures 17 mm short axis.  Rounded pulmonary mass in the esophageal recess measuring 3.3 x 2.8 cm (image 32 from series 3) has central low attenuation peripheral enhancement on series 2. There is right lower lobe mass abuts the theright lower lobe bronchus.  No additional pulmonary nodules are present.  Limited view of the upper abdomen demonstrates normal adrenal glands. Low-density lesion in the left hepatic lobe has simple fluid attenuation consistent with a benign cyst.  No aggressive osseous lesion.  IMPRESSION: 1. 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 2. Mass abuts the right lower lobe bronchus is likely the source of hemoptysis. 3. Subcarinal enlarged lymph node is concerning for ipsilateral nodal metastasis. These results will be called to the ordering clinician or representative by the Radiologist Assistant, and communication documented in the PACS or zVision Dashboard.   Electronically Signed   By: Suzy Bouchard M.D.   On: 04/11/2014 15:50   Nm Pet Image Initial (pi) Skull Base To  Thigh  04/23/2014   CLINICAL DATA:  Initial treatment strategy for non-small cell lung cancer.  EXAM: NUCLEAR MEDICINE PET SKULL BASE TO THIGH  TECHNIQUE: 8.0 mCi F-18 FDG was injected intravenously. Full-ring PET imaging was performed from the skull base to thigh after the radiotracer. CT data was obtained and used for attenuation correction and anatomic localization.  FASTING BLOOD GLUCOSE:  Value: 19 mg/dl  COMPARISON:  CT 04/11/2014.  FINDINGS: NECK  No hypermetabolic lymph nodes in the neck.  CHEST  Right lower lobe infrahilar mass measures 3.7 x 2.4 x 2.3 cm. The SUV max is equal to 9.2. Hypermetabolic sub- carinal lymph node  measures 1.5 cm and has an SUV max equal to 6.6.  No pleural effusion identified.  ABDOMEN/PELVIS  No abnormal hypermetabolic activity within the liver, pancreas, adrenal glands, or spleen. No hypermetabolic lymph nodes in the abdomen or pelvis. Solitary left kidney.  SKELETON  No focal hypermetabolic activity to suggest skeletal metastasis.  IMPRESSION: 1. Right lower lobe lung mass is identified and is intensely hypermetabolic compatible with primary bronchogenic carcinoma. This is considered a T2aN2M0 tumor or stage IIIa.   Electronically Signed   By: Kerby Moors M.D.   On: 04/23/2014 15:56      IMPRESSION: 58 year old woman with Stage IIIA (T2a, N2, M0) adenocarcinoma of the right lower lobe of the lung.  She is a good candidate for possible concurrent chemoradiotherapy with preoperative intent.  PLAN:Today, I talked to the patient and family about the findings and work-up thus far.  We discussed the natural history of stage III non-small cell lung cancer and general treatment, highlighting the role of radiotherapy in the management.  We discussed the available radiation techniques, and focused on the details of logistics and delivery.  We reviewed the anticipated acute and late sequelae associated with radiation in this setting.  The patient was encouraged to ask questions that I answered to the best of my ability.  I filled out a patient counseling form during our discussion including treatment diagrams.  We retained a copy for our records.  The patient would like to proceed with radiation and will be scheduled for CT simulation.  I spent 60 minutes minutes face to face with the patient and more than 50% of that time was spent in counseling and/or coordination of care.    ------------------------------------------------  Sheral Apley. Tammi Klippel, M.D.

## 2014-04-25 NOTE — Patient Instructions (Signed)
Your physician recommends that you continue on your current medications as directed. Please refer to the Current Medication list given to you today.  Your physician has requested that you have an echocardiogram. Echocardiography is a painless test that uses sound waves to create images of your heart. It provides your doctor with information about the size and shape of your heart and how well your heart's chambers and valves are working. This procedure takes approximately one hour. There are no restrictions for this procedure.

## 2014-04-25 NOTE — Telephone Encounter (Signed)
lmom pt is scheduled to see Dr.Smith today @ 4pm

## 2014-04-25 NOTE — Therapy (Signed)
Physical Therapy Evaluation  Patient Details  Name: [name] [name] MRN: [id number] Date of Birth: Jul 11, 1955  Encounter Date: 04/25/2014      PT End of Session - 04/25/14 1500    Visit Number 1   Number of Visits 1   Date for PT Re-Evaluation 06/24/14   PT Start Time 7209   PT Stop Time 1443   PT Time Calculation (min) 18 min      Past Medical History  Diagnosis Date  . Visual disturbance     Episodic  . History of migraine   . Hypertension   . Thyroid nodule   . Varicose veins     left leg  . Vitamin D deficiency   . Congenital renal atrophy     left kidney  . Migraine equivalent 11/09/2012  . Degenerative arthritis   . Shortness of breath dyspnea     with exertion  . Wears glasses   . COPD (chronic obstructive pulmonary disease)   . GERD (gastroesophageal reflux disease)     Past Surgical History  Procedure Laterality Date  . Tonsillectomy    . Cesarean section    . Carpal tunnel release Left   . Colonoscopy w/ biopsies and polypectomy    . Dilation and curettage of uterus    . Video bronchoscopy with endobronchial ultrasound Right 04/17/2014    Procedure: VIDEO BRONCHOSCOPY WITH ENDOBRONCHIAL ULTRASOUND;  Surgeon: Collene Gobble, MD;  Location: [place];  Service: Thoracic;  Laterality: Right;    There were no vitals taken for this visit.  Visit Diagnosis:  Stiffness of vertebral column - Plan: PT plan of care cert/re-cert      Subjective Assessment - 04/25/14 1447    Symptoms Pt. presented with cough and hemoptysis.   Pertinent History CT chest, bronchoscopy, and PET this month led to diagnosis of right lower lobe invasive adenocarcinoma with one node positive, no distant metastases.  Pt. will most likely have chemoradiation and possibly resection.  Ex-smoker.  h/o neck and back arthritis with bone spurs and longstanding L4-5 disc bulge.  Plantar fasciitis with injection in June.   Currently in Pain? No/denies          [facility] PT Assessment - 04/25/14  0001    Assessment   Medical Diagnosis right lower lobe invasive adenocarcinoma   Onset Date 04/11/14   Precautions   Precautions Other (comment)   Precaution Comments cancer precautions   Restrictions   Weight Bearing Restrictions No   Balance Screen   Has the patient fallen in the past 6 months No   Has the patient had a decrease in activity level because of a fear of falling?  No   Is the patient reluctant to leave their home because of a fear of falling?  No   Home Environment   Living Enviornment Private residence   Living Arrangements Spouse/significant other   Type of [place] Two level   Alternate Level Stairs-Rails Can reach both   [place] None   Prior Function   Level of [place] with basic ADLs   Sensation   Additional Comments pt. reports right 5th toe numbness   Posture/Postural Control   Posture/Postural Control No significant limitations   Posture Comments slight forward head   AROM   Overall AROM  Deficits   Overall AROM Comments Standing trunk ROM WFL except approx. 20% limitation in extension, bilateral sidebend (with pulling left trunk on right sidebend).   Strength  Overall Strength Other (comment)   Overall Strength Comments Not formally tested, but pt. denies weakness   Ambulation/Gait   Ambulation/Gait Yes   Ambulation/Gait Assistance 7: Independent   Ambulation Distance (Feet) --  denies limitation due to SOB   Balance   Balance Assessed Yes   Dynamic Standing Balance   Dynamic Standing - Comments Reaches 14 inches forward in standing            PT Education - 04/25/14 1459    Education provided Yes   Education Details Posture, breathing, walking, energy conservation, cough splinting   Person(s) Educated Patient;Spouse   Methods Explanation;Demonstration;Handout   Comprehension Verbalized understanding              Plan - 04/25/14 1500    Clinical Impression Statement Pt. currently doing  well with flexibility, mobility, and endurance.  May benefit from treatment going forward if cancer treatment limits these.   PT Frequency One time visit   PT Treatment/Interventions Patient/family education   PT Next Visit Plan None at this time.   Consulted and Agree with Plan of Care Patient        Problem List Patient Active Problem List   Diagnosis Date Noted  . Primary cancer of right lower lobe of lung 04/17/2014  . Hemoptysis 04/09/2014  . Hx of tobacco use, presenting hazards to health 04/09/2014  . COPD (chronic obstructive pulmonary disease) 04/09/2014  . Cough 04/09/2014  . Migraine equivalent 11/09/2012  . Transient visual loss 01/25/2012                                        Lung Clinic Goals - 04/25/14 1503    Patient will be able to verbalize understanding of the benefit of exercise to decrease fatigue.   Status Achieved   Patient will be able to verbalize the importance of posture.   Status Achieved   Patient will be able to demonstrate diaphragmatic breathing for improved lung function.   Status Achieved   Patient will be able to verbalize understanding of the role of physical therapy to prevent functional decline and who to contact if physical therapy is needed.   Status Achieved              [place], PT 04/25/2014, 3:05 PM

## 2014-04-25 NOTE — Telephone Encounter (Signed)
Pt confirmed labs/ov per 11/19 POF, chemo edu added gave pt AVS..... KJ, sent msg to add chemo

## 2014-04-25 NOTE — Telephone Encounter (Signed)
New msg   Patient calling to state that she may miss appt today at 4:00pm due to appt at cancer center at 2pm. Patient would like to contacted at [phone] or on husband  cell [phone].

## 2014-04-25 NOTE — Progress Notes (Signed)
Patient ID: [name] [name], female   DOB: 01-23-1956, 58 y.o.   MRN: [id number]   Date: 04/25/2014 [name] [name], DOB 1955-08-16, MRN [id number] PCP: [name], Mammie Lorenzo, MD  Reason: Frequent PVCs  ASSESSMENT;  1. Premature ventricular contractions, asymptomatic 2. Lung cancer, non-small cell, stage IIIa 3. Hypertension, essential  PLAN:  1. 2-D Doppler echocardiogram to document structural integrity of the heart and paracardial space 2. If no surprises on echocardiogram, and specific management of premature ventricular contractions will be undertaken. 3. She had premature ventricular contractions become symptomatic, consider reinstitution of low-dose beta blocker therapy or increasing the dose of verapamil   SUBJECTIVE: [name] is a 58 y.o. 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 She has been a long-time smoker. She recently had hemoptysis, and reevaluation as to the diagnosis of a right lower lobe mass which has been identified as non-small cell lung cancer stage IIIa. She was seen by one of her physicians who did an electrocardiogram and was noted to have ventricular trigeminy. [name] [name] has no prior history of heart disease. She has had PVCs in the past and was on Bystolic. This was changed to verapamil facilitate management of headaches.  She denies chest discomfort, exertional palpitations, there is mild dyspnea on exertion, she denies edema, orthopnea, PND, history of atrial fibrillation, DVT, and pulmonary emboli.   Allergies  Allergen Reactions  . Advil [Ibuprofen] Other (See Comments)    Causes hematuria  . Quinolones Palpitations    Prolonged QT  . Hydrocodone-Guaifenesin Itching  . Sulfa Antibiotics Hives and Swelling  . Plasticized Base [Plastibase] Rash  . Zofran [Ondansetron Hcl] Palpitations    Current Outpatient Prescriptions on File Prior to Visit  Medication Sig Dispense Refill  . fluticasone (FLONASE) 50 MCG/ACT nasal  spray Place 2 sprays into both nostrils 2 (two) times daily. 16 g 1  . loratadine (CLARITIN) 10 MG tablet Take 1 tablet (10 mg total) by mouth daily. 30 tablet 1  . omeprazole (PRILOSEC) 20 MG capsule Take 1 capsule (20 mg total) by mouth 2 (two) times daily before a meal. 60 capsule 1  . verapamil (CALAN-SR) 180 MG CR tablet Take 180 mg by mouth at bedtime.    . Vitamin D, Ergocalciferol, (DRISDOL) 50000 UNITS CAPS capsule Take 50,000 Units by mouth. Every other week on mondays  1   No current facility-administered medications on file prior to visit.    Past Medical History  Diagnosis Date  . Visual disturbance     Episodic  . History of migraine   . Hypertension   . Thyroid nodule   . Varicose veins     left leg  . Vitamin D deficiency   . Congenital renal atrophy     left kidney  . Migraine equivalent 11/09/2012  . Degenerative arthritis   . Shortness of breath dyspnea     with exertion  . Wears glasses   . COPD (chronic obstructive pulmonary disease)   . GERD (gastroesophageal reflux disease)     Past Surgical History  Procedure Laterality Date  . Tonsillectomy    . Cesarean section    . Carpal tunnel release Left   . Colonoscopy w/ biopsies and polypectomy    . Dilation and curettage of uterus    . Video bronchoscopy with endobronchial ultrasound Right 04/17/2014    Procedure: VIDEO BRONCHOSCOPY WITH ENDOBRONCHIAL ULTRASOUND;  Surgeon: Collene Gobble, MD;  Location: [place];  Service: Thoracic;  Laterality: Right;  History   Social History  . Marital Status: Married    Spouse Name: N/A    Number of Children: 1  . Years of Education: 16   Occupational History  . nurse [employer]   Social History Main Topics  . Smoking status: Former Smoker -- 0.50 packs/day for 40 years    Types: Cigarettes    Quit date: 03/17/2013  . Smokeless tobacco: Never Used  . Alcohol Use: 0.0 oz/week    0 Not specified per week     Comment: " rare"  . Drug Use: No  . Sexual  Activity: Not on file   Other Topics Concern  . Not on file   Social History Narrative    Family History  Problem Relation Age of Onset  . Cancer Mother     cancer of the small intestines  . Hypertension Father   . Atrial fibrillation Father   . Diverticulitis Father   . Hypertension Sister   . Diverticulitis Brother   . Hypertension Sister   . Headache Sister     ROS: She gained some weight after smoking cessation. Off cigarettes her cough has markedly improved. Hemoptysis is not recurred. No difficulty swallowing. She has not had seizures or neurological complaints. Bowel habits have been stable. No lower extremity discomfort or claudication.. Other systems negative for complaints.  OBJECTIVE: BP 148/90 mmHg  Pulse 84  Ht 5\' 5"  (1.651 m)  Wt 160 lb (72.576 kg)  BMI 26.63 kg/m2,  General: No acute distress, and stable. HEENT: normal no jaundice or pallor Neck: JVD flat. Carotids absent Chest: Clear Cardiac: Murmur: None. Gallop: S4. Rhythm: Normal with occasional irregularity. Other: None Abdomen: Bruit: Absent. Pulsation: Absent Extremities: Edema: Absent. Pulses: 2+ and symmetric bilateral Neuro: Normal Psych: Anxious  ECG: Normal sinus rhythm with nonspecific diffuse ST abnormality. Prior EKG reveals normal sinus rhythm with ventricular trigeminy.

## 2014-04-25 NOTE — Progress Notes (Signed)
Faxed application for gas card to lung cancer initiative.  Confirmation fax received.

## 2014-04-26 ENCOUNTER — Encounter: Payer: Self-pay | Admitting: *Deleted

## 2014-04-26 ENCOUNTER — Ambulatory Visit
Admission: RE | Admit: 2014-04-26 | Discharge: 2014-04-26 | Disposition: A | Discharge status: Home / Self Care | Payer: Managed Care, Other (non HMO) | Source: Ambulatory Visit | Attending: Radiation Oncology | Admitting: Radiation Oncology

## 2014-04-26 ENCOUNTER — Telehealth: Payer: Self-pay | Admitting: *Deleted

## 2014-04-26 ENCOUNTER — Encounter: Payer: Self-pay | Admitting: Radiation Oncology

## 2014-04-26 VITALS — Ht 65.0 in | Wt 159.7 lb

## 2014-04-26 VITALS — BP 141/89 | HR 96 | Temp 98.0°F

## 2014-04-26 DIAGNOSIS — C3431 Malignant neoplasm of lower lobe, right bronchus or lung: Secondary | ICD-10-CM

## 2014-04-26 DIAGNOSIS — Z510 Encounter for antineoplastic radiation therapy: Secondary | ICD-10-CM | POA: Diagnosis not present

## 2014-04-26 NOTE — Telephone Encounter (Signed)
Per staff message and POF I have scheduled appts. Advised scheduler of appts also to move labs. No available on 1/30 for treatment moved to 12/1  JMW

## 2014-04-26 NOTE — Progress Notes (Signed)
  Radiation Oncology         [phone]) [phone] ________________________________  Name: [name] [name] MRN: [id number]  Date: 04/26/2014  DOB: 26-Dec-1955     SIMULATION AND TREATMENT PLANNING NOTE    ICD-9-CM ICD-10-CM   1. Primary cancer of right lower lobe of lung 162.5 C34.31     DIAGNOSIS:  58 year old woman with Stage IIIA (T2a, N2, M0) adenocarcinoma of the right lower lobe of the lung  NARRATIVE:  The patient was brought to the [place].  Identity was confirmed.  All relevant records and images related to the planned course of therapy were reviewed.  The patient freely provided informed written consent to proceed with treatment after reviewing the details related to the planned course of therapy. The consent form was witnessed and verified by the simulation staff.  Then, the patient was set-up in a stable reproducible  supine position for radiation therapy.  CT images were obtained.  Surface markings were placed.  The CT images were loaded into the planning software.  Then the target and avoidance structures were contoured.  Treatment planning then occurred.  The radiation prescription was entered and confirmed.  Then, I designed and supervised the construction of a total of 5 medically necessary complex treatment devices. The complex treatment devices included 5 multileaf collimator shaping radiation conformally around the targets of the primary tumor and involved lymph node while avoiding critical structures including the uninvolved lungs heart and spinal cord and esophagus. Daily image guidance is required using cone beam CT to precisely cover the target volume while sparing critical organs. I have requested : 3D Simulation  I have requested a DVH of the following structures: The targets, the heart, the left lung, right lung, the esophagus, and the spinal cord..  I have ordered:Nutrition Consult  SPECIAL TREATMENT PROCEDURE:  The planned course of therapy using radiation  constitutes a special treatment procedure. Special care is required in the management of this patient for the following reasons.  I have requested : This treatment constitutes a Special Treatment Procedure for the following reason: [ Concurrent chemotherapy requiring careful monitoring for increased toxicities of treatment including weekly laboratory values.  The special nature of the planned course of radiotherapy will require increased physician supervision and oversight to ensure patient's safety with optimal treatment outcomes.  PLAN:  The patient will receive 66 Gy in 33 fractions with a potential stopping point after 44 Gy to assess for possible tumor resection..  ________________________________  Sheral Apley Tammi Klippel, M.D.

## 2014-04-26 NOTE — Progress Notes (Signed)
   Thoracic Treatment Summary Name:[name] [name] Date:04/26/2014 DOB:[birth year] Your Medical Team Medical Oncologist:Dr. Julien Nordmann Radiation Oncologist:Dr. Tammi Klippel Pulmonologist: Dr. Lamonte Sakai Type and Stage of Lung Cancer Non-Small Cell Carcinoma: Adenocarcinoma   Clinical Stage:  Primary cancer of right lower lobe of lung   Staging form: Lung, AJCC 7th Edition     Clinical: Stage IIIA (T2a, N2, M0) - Unsigned       Staging comments: Adenocarcinoma     Clinical stage is based on radiology exams.  Pathological stage will be determined after surgery.  Staging is based on the size of the tumor, involvement of lymph nodes or not, and whether or not the cancer center has spread. Recommendations Recommendations: Concurrent chemoradiation therapy  These recommendations are based on information available as of today's consult.  This is subject to change depending further testing or exams. Next Steps Next Step: Medical Oncology will set up follow up appointments  Radiation Oncology will set up follow up apptointments Barriers to Care What do you perceive as a potential barrier that may prevent you from receiving your treatment plan? Education Scientist, clinical (histocompatibility and immunogenetics) given and explained  Support Information on support services at [facility] given and explained.  Social Worker will follow up with patient  Financial Will set up with [facility] [employer], financial advocate  Resources Given: NCI Booklet on [employer] at [employer].[name] [phone]    Questions Norton Blizzard, RN BSN Thoracic Oncology Nurse Navigator at [place] is a nurse navigator that is available to assist you through your cancer journey.  She can answer your questions and/or provide resources regarding your treatment plan, emotional support, or financial concerns.

## 2014-04-26 NOTE — Telephone Encounter (Signed)
Called to follow up from thoracic clinic.  No answer left my name and phone number to call if needed.

## 2014-04-28 DIAGNOSIS — Z510 Encounter for antineoplastic radiation therapy: Secondary | ICD-10-CM | POA: Diagnosis not present

## 2014-04-29 DIAGNOSIS — Z510 Encounter for antineoplastic radiation therapy: Secondary | ICD-10-CM | POA: Diagnosis not present

## 2014-04-30 ENCOUNTER — Other Ambulatory Visit: Payer: Managed Care, Other (non HMO)

## 2014-05-01 ENCOUNTER — Encounter: Payer: Self-pay | Admitting: *Deleted

## 2014-05-01 ENCOUNTER — Other Ambulatory Visit: Payer: Self-pay | Admitting: *Deleted

## 2014-05-01 ENCOUNTER — Ambulatory Visit (HOSPITAL_COMMUNITY): Payer: Managed Care, Other (non HMO) | Attending: Cardiology | Admitting: Radiology

## 2014-05-01 ENCOUNTER — Other Ambulatory Visit: Payer: Managed Care, Other (non HMO)

## 2014-05-01 DIAGNOSIS — Z87891 Personal history of nicotine dependence: Secondary | ICD-10-CM | POA: Insufficient documentation

## 2014-05-01 DIAGNOSIS — I10 Essential (primary) hypertension: Secondary | ICD-10-CM

## 2014-05-01 DIAGNOSIS — R0602 Shortness of breath: Secondary | ICD-10-CM | POA: Diagnosis present

## 2014-05-01 DIAGNOSIS — C3431 Malignant neoplasm of lower lobe, right bronchus or lung: Secondary | ICD-10-CM

## 2014-05-01 MED ORDER — PROCHLORPERAZINE MALEATE 10 MG PO TABS: 10.0000 mg | ORAL_TABLET | Freq: Four times a day (QID) | ORAL | Status: DC | PRN

## 2014-05-01 NOTE — Progress Notes (Signed)
Echocardiogram performed.  

## 2014-05-06 ENCOUNTER — Telehealth: Payer: Self-pay

## 2014-05-06 ENCOUNTER — Telehealth: Payer: Self-pay | Admitting: *Deleted

## 2014-05-06 ENCOUNTER — Other Ambulatory Visit: Payer: Managed Care, Other (non HMO)

## 2014-05-06 ENCOUNTER — Ambulatory Visit
Admission: RE | Admit: 2014-05-06 | Discharge: 2014-05-06 | Disposition: A | Discharge status: Home / Self Care | Payer: Managed Care, Other (non HMO) | Source: Ambulatory Visit | Attending: Radiation Oncology | Admitting: Radiation Oncology

## 2014-05-06 ENCOUNTER — Other Ambulatory Visit: Payer: Self-pay | Admitting: *Deleted

## 2014-05-06 DIAGNOSIS — C3431 Malignant neoplasm of lower lobe, right bronchus or lung: Secondary | ICD-10-CM

## 2014-05-06 DIAGNOSIS — Z510 Encounter for antineoplastic radiation therapy: Secondary | ICD-10-CM | POA: Diagnosis not present

## 2014-05-06 NOTE — Telephone Encounter (Signed)
-----   Message from Sinclair Grooms, MD sent at 05/01/2014  4:45 PM EST ----- Normal

## 2014-05-06 NOTE — Telephone Encounter (Signed)
Patient called and left vm message regarding treatment questions.  I called her and she explained.  She just wanted to double check with Dr. Julien Nordmann about her Floyce Stakes and MRI after 1st treatment.  I spoke with Dr. Julien Nordmann regarding concerns.  He asked that I speak with her about bradycardia and sysmptoms to look for.  He also stated MRI after 1st treatment was ok.  I will call her back.

## 2014-05-06 NOTE — Telephone Encounter (Signed)
Called patient back and spoke with her.  We discussed symptoms of bradycardia.  She verbalized understanding and stated she would check her pulse in am and pm and notify us if any problems.  I also clarified that Dr. Julien Nordmann is aware of MRI brain after 1st chemo and that was okay with him.  She was thankful for the call and clarification.

## 2014-05-06 NOTE — Telephone Encounter (Signed)
Pt aware of echo results.Normal.pt verbalized understanding.

## 2014-05-07 ENCOUNTER — Ambulatory Visit
Admission: RE | Admit: 2014-05-07 | Discharge: 2014-05-07 | Disposition: A | Discharge status: Home / Self Care | Payer: Managed Care, Other (non HMO) | Source: Ambulatory Visit | Attending: Radiation Oncology | Admitting: Radiation Oncology

## 2014-05-07 ENCOUNTER — Other Ambulatory Visit (HOSPITAL_BASED_OUTPATIENT_CLINIC_OR_DEPARTMENT_OTHER): Payer: Managed Care, Other (non HMO)

## 2014-05-07 ENCOUNTER — Ambulatory Visit (HOSPITAL_BASED_OUTPATIENT_CLINIC_OR_DEPARTMENT_OTHER): Payer: Managed Care, Other (non HMO)

## 2014-05-07 ENCOUNTER — Encounter: Payer: Self-pay | Admitting: *Deleted

## 2014-05-07 DIAGNOSIS — Z510 Encounter for antineoplastic radiation therapy: Secondary | ICD-10-CM | POA: Diagnosis not present

## 2014-05-07 DIAGNOSIS — C3431 Malignant neoplasm of lower lobe, right bronchus or lung: Secondary | ICD-10-CM

## 2014-05-07 DIAGNOSIS — Z5111 Encounter for antineoplastic chemotherapy: Secondary | ICD-10-CM

## 2014-05-07 LAB — CBC WITH DIFFERENTIAL/PLATELET
BASO%: 0.4 % (ref 0.0–2.0)
Basophils Absolute: 0 10*3/uL (ref 0.0–0.1)
EOS%: 3 % (ref 0.0–7.0)
Eosinophils Absolute: 0.2 10*3/uL (ref 0.0–0.5)
HEMATOCRIT: 41.5 % (ref 34.8–46.6)
HEMOGLOBIN: 13.5 g/dL (ref 11.6–15.9)
LYMPH#: 1.6 10*3/uL (ref 0.9–3.3)
LYMPH%: 22.1 % (ref 14.0–49.7)
MCH: 29.2 pg (ref 25.1–34.0)
MCHC: 32.5 g/dL (ref 31.5–36.0)
MCV: 89.6 fL (ref 79.5–101.0)
MONO#: 0.5 10*3/uL (ref 0.1–0.9)
MONO%: 6.9 % (ref 0.0–14.0)
NEUT#: 4.9 10*3/uL (ref 1.5–6.5)
NEUT%: 67.6 % (ref 38.4–76.8)
Platelets: 264 10*3/uL (ref 145–400)
RBC: 4.63 10*6/uL (ref 3.70–5.45)
RDW: 13.3 % (ref 11.2–14.5)
WBC: 7.3 10*3/uL (ref 3.9–10.3)

## 2014-05-07 LAB — COMPREHENSIVE METABOLIC PANEL (CC13)
ALBUMIN: 3.7 g/dL (ref 3.5–5.0)
ALT: 13 U/L (ref 0–55)
ANION GAP: 10 meq/L (ref 3–11)
AST: 13 U/L (ref 5–34)
Alkaline Phosphatase: 95 U/L (ref 40–150)
BUN: 17.1 mg/dL (ref 7.0–26.0)
CALCIUM: 9.4 mg/dL (ref 8.4–10.4)
CHLORIDE: 108 meq/L (ref 98–109)
CO2: 21 meq/L — AB (ref 22–29)
CREATININE: 0.8 mg/dL (ref 0.6–1.1)
Glucose: 125 mg/dl (ref 70–140)
POTASSIUM: 3.9 meq/L (ref 3.5–5.1)
Sodium: 139 mEq/L (ref 136–145)
TOTAL PROTEIN: 6.9 g/dL (ref 6.4–8.3)
Total Bilirubin: 0.38 mg/dL (ref 0.20–1.20)

## 2014-05-07 MED ORDER — CARBOPLATIN CHEMO INJECTION 450 MG/45ML
222.6000 mg | Freq: Once | INTRAVENOUS | Status: AC
  Administered 2014-05-07: 220 mg via INTRAVENOUS
  Filled 2014-05-07: qty 22

## 2014-05-07 MED ORDER — DEXAMETHASONE SODIUM PHOSPHATE 20 MG/5ML IJ SOLN
20.0000 mg | Freq: Once | INTRAMUSCULAR | Status: AC
  Administered 2014-05-07: 20 mg via INTRAVENOUS

## 2014-05-07 MED ORDER — ONDANSETRON 16 MG/50ML IVPB (CHCC): 16.0000 mg | Freq: Once | INTRAVENOUS | Status: DC

## 2014-05-07 MED ORDER — DEXAMETHASONE SODIUM PHOSPHATE 20 MG/5ML IJ SOLN
INTRAMUSCULAR | Status: AC
  Filled 2014-05-07: qty 5

## 2014-05-07 MED ORDER — FAMOTIDINE IN NACL 20-0.9 MG/50ML-% IV SOLN
INTRAVENOUS | Status: AC
  Filled 2014-05-07: qty 50

## 2014-05-07 MED ORDER — DIPHENHYDRAMINE HCL 50 MG/ML IJ SOLN
INTRAMUSCULAR | Status: AC
  Filled 2014-05-07: qty 1

## 2014-05-07 MED ORDER — DIPHENHYDRAMINE HCL 50 MG/ML IJ SOLN
50.0000 mg | Freq: Once | INTRAMUSCULAR | Status: AC
  Administered 2014-05-07: 50 mg via INTRAVENOUS

## 2014-05-07 MED ORDER — PROCHLORPERAZINE EDISYLATE 5 MG/ML IJ SOLN
10.0000 mg | Freq: Once | INTRAMUSCULAR | Status: AC
  Administered 2014-05-07: 10 mg via INTRAVENOUS

## 2014-05-07 MED ORDER — ONDANSETRON 16 MG/50ML IVPB (CHCC)
INTRAVENOUS | Status: AC
  Filled 2014-05-07: qty 16

## 2014-05-07 MED ORDER — PROCHLORPERAZINE EDISYLATE 5 MG/ML IJ SOLN
INTRAMUSCULAR | Status: AC
  Filled 2014-05-07: qty 2

## 2014-05-07 MED ORDER — PACLITAXEL CHEMO INJECTION 300 MG/50ML
45.0000 mg/m2 | Freq: Once | INTRAVENOUS | Status: AC
  Administered 2014-05-07: 84 mg via INTRAVENOUS
  Filled 2014-05-07: qty 14

## 2014-05-07 MED ORDER — FAMOTIDINE IN NACL 20-0.9 MG/50ML-% IV SOLN
20.0000 mg | Freq: Once | INTRAVENOUS | Status: AC
  Administered 2014-05-07: 20 mg via INTRAVENOUS

## 2014-05-07 MED ORDER — SODIUM CHLORIDE 0.9 % IV SOLN
Freq: Once | INTRAVENOUS | Status: AC
  Administered 2014-05-07: 09:00:00 via INTRAVENOUS

## 2014-05-07 NOTE — Patient Instructions (Signed)
[facility] Discharge Instructions for Patients Receiving Chemotherapy  Today you received the following chemotherapy agents Adriamycin/Paclitaxel.   To help prevent nausea and vomiting after your treatment, we encourage you to take your nausea medication as directed.    If you develop nausea and vomiting that is not controlled by your nausea medication, call the clinic.   BELOW ARE SYMPTOMS THAT SHOULD BE REPORTED IMMEDIATELY:  *FEVER GREATER THAN 100.5 F  *CHILLS WITH OR WITHOUT FEVER  NAUSEA AND VOMITING THAT IS NOT CONTROLLED WITH YOUR NAUSEA MEDICATION  *UNUSUAL SHORTNESS OF BREATH  *UNUSUAL BRUISING OR BLEEDING  TENDERNESS IN MOUTH AND THROAT WITH OR WITHOUT PRESENCE OF ULCERS  *URINARY PROBLEMS  *BOWEL PROBLEMS  UNUSUAL RASH Items with * indicate a potential emergency and should be followed up as soon as possible.  Feel free to call the clinic you have any questions or concerns. The clinic phone number is [phone].

## 2014-05-07 NOTE — [facility] Oncology Navigator Note (Signed)
Spoke with patient and husband at chemo today.  She is getting her first chemotherapy today.  She did not seem to want to engage in conversation.  I asked if she had any questions and she stated no.

## 2014-05-08 ENCOUNTER — Ambulatory Visit
Admission: RE | Admit: 2014-05-08 | Discharge: 2014-05-08 | Disposition: A | Discharge status: Home / Self Care | Payer: Managed Care, Other (non HMO) | Source: Ambulatory Visit | Attending: Radiation Oncology | Admitting: Radiation Oncology

## 2014-05-08 ENCOUNTER — Telehealth: Payer: Self-pay | Admitting: *Deleted

## 2014-05-08 DIAGNOSIS — Z510 Encounter for antineoplastic radiation therapy: Secondary | ICD-10-CM | POA: Diagnosis not present

## 2014-05-08 NOTE — Telephone Encounter (Signed)
Called [name] [name] at [phone] number(s).  Message left requesting a return call for chemotherapy follow up.  Awaiting return call from patient.

## 2014-05-08 NOTE — Telephone Encounter (Signed)
-----   Message from Ronnette Juniper, RN sent at 05/07/2014  3:22 PM EST ----- Regarding: 1st treatment 1st treatment: Paclitaxel/Carboplatin. Dr. Julien Nordmann.   [phone] ([phone] (M)

## 2014-05-09 ENCOUNTER — Encounter: Payer: Self-pay | Admitting: Emergency Medicine

## 2014-05-09 ENCOUNTER — Ambulatory Visit
Admission: RE | Admit: 2014-05-09 | Discharge: 2014-05-09 | Disposition: A | Discharge status: Home / Self Care | Payer: Managed Care, Other (non HMO) | Source: Ambulatory Visit | Attending: Radiation Oncology | Admitting: Radiation Oncology

## 2014-05-09 ENCOUNTER — Ambulatory Visit (INDEPENDENT_AMBULATORY_CARE_PROVIDER_SITE_OTHER): Payer: Managed Care, Other (non HMO) | Admitting: Emergency Medicine

## 2014-05-09 ENCOUNTER — Encounter: Payer: Managed Care, Other (non HMO) | Admitting: Emergency Medicine

## 2014-05-09 ENCOUNTER — Telehealth: Payer: Self-pay | Admitting: Emergency Medicine

## 2014-05-09 ENCOUNTER — Encounter (INDEPENDENT_AMBULATORY_CARE_PROVIDER_SITE_OTHER): Payer: Self-pay

## 2014-05-09 ENCOUNTER — Other Ambulatory Visit: Payer: Self-pay | Admitting: *Deleted

## 2014-05-09 VITALS — BP 142/74 | HR 109 | Ht 64.0 in | Wt 161.0 lb

## 2014-05-09 DIAGNOSIS — R05 Cough: Secondary | ICD-10-CM

## 2014-05-09 DIAGNOSIS — Z87891 Personal history of nicotine dependence: Secondary | ICD-10-CM

## 2014-05-09 DIAGNOSIS — J449 Chronic obstructive pulmonary disease, unspecified: Secondary | ICD-10-CM

## 2014-05-09 DIAGNOSIS — J9811 Atelectasis: Secondary | ICD-10-CM

## 2014-05-09 DIAGNOSIS — Z510 Encounter for antineoplastic radiation therapy: Secondary | ICD-10-CM | POA: Diagnosis not present

## 2014-05-09 DIAGNOSIS — R042 Hemoptysis: Secondary | ICD-10-CM

## 2014-05-09 DIAGNOSIS — C3431 Malignant neoplasm of lower lobe, right bronchus or lung: Secondary | ICD-10-CM

## 2014-05-09 DIAGNOSIS — R059 Cough, unspecified: Secondary | ICD-10-CM

## 2014-05-09 LAB — PULMONARY FUNCTION TEST
DL/VA % pred: 68 %
DL/VA: 3.33 ml/min/mmHg/L
DLCO unc % pred: 63 %
DLCO unc: 15.53 ml/min/mmHg
FEF 25-75 Post: 0.75 L/sec
FEF 25-75 Pre: 0.79 L/sec
FEF2575-%CHANGE-POST: -6 %
FEF2575-%PRED-POST: 31 %
FEF2575-%Pred-Pre: 33 %
FEV1-%CHANGE-POST: 0 %
FEV1-%PRED-PRE: 65 %
FEV1-%Pred-Post: 65 %
FEV1-POST: 1.68 L
FEV1-Pre: 1.68 L
FEV1FVC-%CHANGE-POST: 5 %
FEV1FVC-%PRED-PRE: 78 %
FEV6-%Change-Post: -3 %
FEV6-%Pred-Post: 79 %
FEV6-%Pred-Pre: 82 %
FEV6-Post: 2.55 L
FEV6-Pre: 2.65 L
FEV6FVC-%Change-Post: 1 %
FEV6FVC-%PRED-PRE: 102 %
FEV6FVC-%Pred-Post: 103 %
FVC-%Change-Post: -5 %
FVC-%Pred-Post: 78 %
FVC-%Pred-Pre: 82 %
FVC-POST: 2.59 L
FVC-Pre: 2.73 L
POST FEV1/FVC RATIO: 65 %
POST FEV6/FVC RATIO: 99 %
Pre FEV1/FVC ratio: 62 %
Pre FEV6/FVC Ratio: 98 %
RV % pred: 105 %
RV: 2.13 L
TLC % PRED: 97 %
TLC: 4.98 L

## 2014-05-09 MED ORDER — ALBUTEROL SULFATE HFA 108 (90 BASE) MCG/ACT IN AERS: 2.0000 | INHALATION_SPRAY | Freq: Four times a day (QID) | RESPIRATORY_TRACT | Status: DC | PRN

## 2014-05-09 MED ORDER — FLUTICASONE PROPIONATE 50 MCG/ACT NA SUSP: 2.0000 | Freq: Two times a day (BID) | NASAL | Status: DC

## 2014-05-09 MED ORDER — LORATADINE 10 MG PO TABS: 10.0000 mg | ORAL_TABLET | Freq: Every day | ORAL | Status: DC

## 2014-05-09 MED ORDER — OMEPRAZOLE 20 MG PO CPDR: 20.0000 mg | DELAYED_RELEASE_CAPSULE | Freq: Every day | ORAL | Status: DC

## 2014-05-09 NOTE — Assessment & Plan Note (Signed)
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 She is clinically quite stable. She has tried empiric bronchodilators recently without significant change in her status. For now would like to teach her to use albuterol as needed. We will not start a scheduled bronchodilator at this time but will reconsider if she develops symptoms.

## 2014-05-09 NOTE — Progress Notes (Signed)
   Subjective:    Patient ID: [name] [name], female    DOB: 11-23-1955, 58 y.o.   MRN: [id number]  HPI 58 yo woman, former smoker (20 pk-yrs), hx of HTN, migraines. She is referred by Dr Amedeo Kinsman for chronic cough, hemoptysis. She has been given a dx of COPD in 2014. Spirometry was done > per pt shows AFL. Imaging has identified some L sided linear atx (CXR 02/2014).   The cough has been non-productive for most part, often paroxysmal, can cause emesis. Has waxed and waned. She has been treated with abx, with scheduled BD's. For about a week in August she experienced some hemoptysis, blood streaks in clear sputum.    She denies any real allergy sx. She tries loratadine on her own > no difference. Was started on flonase and seemed to help some. She does not have any heartburn or noticeable GERD  ROV 05/09/14 -- follow up visit after FOB and EBUS >> showed invasive adenoCA. She has started chemo and XRT. 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 Normal volumes, decrease DLCO.  She is feeling fairly well. Breathing has been better since she quit smoking. She does have some mild DOE, paces herself. She has had bronchitis in the Fall for several years. Her cough is better. She has been on both Symbicort and Spiriva in the past - no real difference.    Review of Systems  Constitutional: Negative for fever and unexpected weight change.  HENT: Negative for congestion, dental problem, ear pain, nosebleeds, postnasal drip, rhinorrhea, sinus pressure, sneezing, sore throat and trouble swallowing.   Eyes: Negative for redness and itching.  Respiratory: Positive for cough and shortness of breath. Negative for chest tightness and wheezing.   Cardiovascular: Negative for palpitations and leg swelling.  Gastrointestinal: Negative for nausea and vomiting.  Genitourinary: Negative for dysuria.  Musculoskeletal: Negative for joint swelling.  Skin: Negative for rash.  Neurological: Negative for  headaches.  Hematological: Does not bruise/bleed easily.  Psychiatric/Behavioral: Negative for dysphoric mood. The patient is not nervous/anxious.        Objective:   Physical Exam Filed Vitals:   05/09/14 1401  BP: 142/74  Pulse: 109  Height: 5\' 4"  (1.626 m)  Weight: 161 lb (73.029 kg)  SpO2: 97%   Gen: Pleasant, well-nourished, in no distress,  normal affect  ENT: No lesions,  mouth clear,  oropharynx clear, no postnasal drip  Lungs: No use of accessory muscles, clear without rales or rhonchi  Cardiovascular: RRR, heart sounds normal, no murmur or gallops, no peripheral edema  Musculoskeletal: No deformities, no cyanosis or clubbing  Neuro: alert, non focal  Skin: Warm, no lesions or rashes       Assessment & Plan:  COPD (chronic obstructive pulmonary disease) 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 She is clinically quite stable. She has tried empiric bronchodilators recently without significant change in her status. For now would like to teach her to use albuterol as needed. We will not start a scheduled bronchodilator at this time but will reconsider if she develops symptoms.   Cough We will also continue her empiric therapy for rhinitis and GERD

## 2014-05-09 NOTE — Telephone Encounter (Signed)
lmtcb x1 

## 2014-05-09 NOTE — Patient Instructions (Addendum)
Please continue your loratadine and fluticasone nasal spray Decrease omeprazole to once a day to see if this impacts your cough. If so then you can increase back to twice a day We will start albuterol 2 puffs if needed for shortness of breath Follow with Drs Julien Nordmann and Tammi Klippel as planned Follow with Dr Lamonte Sakai in 6 months or sooner if you have any problems

## 2014-05-09 NOTE — Assessment & Plan Note (Signed)
We will also continue her empiric therapy for rhinitis and GERD

## 2014-05-10 ENCOUNTER — Encounter: Payer: Self-pay | Admitting: Radiation Oncology

## 2014-05-10 ENCOUNTER — Ambulatory Visit
Admission: RE | Admit: 2014-05-10 | Discharge: 2014-05-10 | Disposition: A | Discharge status: Home / Self Care | Payer: Managed Care, Other (non HMO) | Source: Ambulatory Visit | Attending: Radiation Oncology | Admitting: Radiation Oncology

## 2014-05-10 DIAGNOSIS — Z510 Encounter for antineoplastic radiation therapy: Secondary | ICD-10-CM | POA: Diagnosis not present

## 2014-05-10 DIAGNOSIS — C3431 Malignant neoplasm of lower lobe, right bronchus or lung: Secondary | ICD-10-CM | POA: Diagnosis not present

## 2014-05-10 MED ORDER — RADIAPLEXRX EX GEL
Freq: Once | CUTANEOUS | Status: AC
  Administered 2014-05-10: 16:00:00 via TOPICAL

## 2014-05-10 NOTE — Progress Notes (Signed)
Patient education completed with patient. Gave her "Radiation and You" booklet with all pertinent information marked and discussed, re: fatigue, skin irritation/care, throat irritation/management, nutrition, pain. Gave pt Radiaplex with instructions for proper use. Teach back method used. Patient had no questions. Pt verbalized understanding.   Patient denies pain, cough, fatigue, loss of appetite. She has SOB with exertion.

## 2014-05-10 NOTE — Telephone Encounter (Signed)
Pt was seen after this message on 05-09-14, nothing further needed. [place] Bing, CMA

## 2014-05-12 NOTE — Progress Notes (Signed)
  Radiation Oncology         [phone] ________________________________  Name: [name] [name] MRN: [id number]  Date: 05/10/2014  DOB: 1955-07-22     Weekly Radiation Therapy Management    ICD-9-CM ICD-10-CM   1. Primary cancer of right lower lobe of lung 162.5 C34.31 hyaluronate sodium (RADIAPLEXRX) gel    Current Dose: 10 Gy     Planned Dose:  66 Gy  Narrative . . . . . . . . The patient presents for routine under treatment assessment.                                   Patient education completed with patient my Psychiatric nurse. She gave her "Radiation and You" booklet with all pertinent information marked and discussed, re: fatigue, skin irritation/care, throat irritation/management, nutrition, pain. Gave pt Radiaplex with instructions for proper use. Teach back method used. Patient had no questions. Pt verbalized understanding. Patient denies pain, cough, fatigue, loss of appetite. She has SOB with exertion.                                  Set-up films were reviewed.                                 The chart was checked. Physical Findings. . .  weight is 158 lb 11.2 oz (71.986 kg). Her oral temperature is 98.1 F (36.7 C). Her blood pressure is 125/84 and her pulse is 99. Her respiration is 20 and oxygen saturation is 99%. . Weight essentially stable.  No significant changes. Impression . . . . . . . The patient is tolerating radiation. Plan . . . . . . . . . . . . Continue treatment as planned.  ________________________________  Sheral Apley. Tammi Klippel, M.D.

## 2014-05-13 ENCOUNTER — Ambulatory Visit (HOSPITAL_BASED_OUTPATIENT_CLINIC_OR_DEPARTMENT_OTHER): Payer: Managed Care, Other (non HMO) | Admitting: Physician Assistant

## 2014-05-13 ENCOUNTER — Other Ambulatory Visit (HOSPITAL_BASED_OUTPATIENT_CLINIC_OR_DEPARTMENT_OTHER): Payer: Managed Care, Other (non HMO)

## 2014-05-13 ENCOUNTER — Telehealth: Payer: Self-pay | Admitting: Internal Medicine

## 2014-05-13 ENCOUNTER — Ambulatory Visit
Admission: RE | Admit: 2014-05-13 | Discharge: 2014-05-13 | Disposition: A | Discharge status: Home / Self Care | Payer: Managed Care, Other (non HMO) | Source: Ambulatory Visit | Attending: Radiation Oncology | Admitting: Radiation Oncology

## 2014-05-13 ENCOUNTER — Ambulatory Visit (HOSPITAL_BASED_OUTPATIENT_CLINIC_OR_DEPARTMENT_OTHER): Payer: Managed Care, Other (non HMO)

## 2014-05-13 ENCOUNTER — Encounter: Payer: Self-pay | Admitting: Physician Assistant

## 2014-05-13 ENCOUNTER — Encounter (HOSPITAL_COMMUNITY): Payer: Self-pay

## 2014-05-13 VITALS — BP 124/68 | HR 92 | Temp 98.2°F | Resp 17 | Ht 64.0 in | Wt 160.8 lb

## 2014-05-13 DIAGNOSIS — C3431 Malignant neoplasm of lower lobe, right bronchus or lung: Secondary | ICD-10-CM

## 2014-05-13 DIAGNOSIS — Z5111 Encounter for antineoplastic chemotherapy: Secondary | ICD-10-CM

## 2014-05-13 DIAGNOSIS — G4700 Insomnia, unspecified: Secondary | ICD-10-CM

## 2014-05-13 DIAGNOSIS — Z510 Encounter for antineoplastic radiation therapy: Secondary | ICD-10-CM | POA: Diagnosis not present

## 2014-05-13 LAB — COMPREHENSIVE METABOLIC PANEL (CC13)
ALT: 18 U/L (ref 0–55)
ANION GAP: 8 meq/L (ref 3–11)
AST: 13 U/L (ref 5–34)
Albumin: 3.5 g/dL (ref 3.5–5.0)
Alkaline Phosphatase: 91 U/L (ref 40–150)
BUN: 19.8 mg/dL (ref 7.0–26.0)
CHLORIDE: 111 meq/L — AB (ref 98–109)
CO2: 21 mEq/L — ABNORMAL LOW (ref 22–29)
CREATININE: 0.7 mg/dL (ref 0.6–1.1)
Calcium: 9.5 mg/dL (ref 8.4–10.4)
EGFR: 89 mL/min/{1.73_m2} — ABNORMAL LOW (ref >90)
Glucose: 98 mg/dl (ref 70–140)
Potassium: 4.1 mEq/L (ref 3.5–5.1)
Sodium: 141 mEq/L (ref 136–145)
Total Bilirubin: 0.34 mg/dL (ref 0.20–1.20)
Total Protein: 6.4 g/dL (ref 6.4–8.3)

## 2014-05-13 LAB — CBC WITH DIFFERENTIAL/PLATELET
BASO%: 0.2 % (ref 0.0–2.0)
Basophils Absolute: 0 10*3/uL (ref 0.0–0.1)
EOS ABS: 0.2 10*3/uL (ref 0.0–0.5)
EOS%: 2.5 % (ref 0.0–7.0)
HEMATOCRIT: 39.1 % (ref 34.8–46.6)
HEMOGLOBIN: 12.8 g/dL (ref 11.6–15.9)
LYMPH%: 18.3 % (ref 14.0–49.7)
MCH: 29.5 pg (ref 25.1–34.0)
MCHC: 32.7 g/dL (ref 31.5–36.0)
MCV: 90.1 fL (ref 79.5–101.0)
MONO#: 0.4 10*3/uL (ref 0.1–0.9)
MONO%: 7.3 % (ref 0.0–14.0)
NEUT%: 71.7 % (ref 38.4–76.8)
NEUTROS ABS: 4.2 10*3/uL (ref 1.5–6.5)
Platelets: 262 10*3/uL (ref 145–400)
RBC: 4.34 10*6/uL (ref 3.70–5.45)
RDW: 12.9 % (ref 11.2–14.5)
WBC: 5.9 10*3/uL (ref 3.9–10.3)
lymph#: 1.1 10*3/uL (ref 0.9–3.3)

## 2014-05-13 MED ORDER — DEXAMETHASONE SODIUM PHOSPHATE 20 MG/5ML IJ SOLN
INTRAMUSCULAR | Status: AC
  Filled 2014-05-13: qty 5

## 2014-05-13 MED ORDER — DEXAMETHASONE SODIUM PHOSPHATE 20 MG/5ML IJ SOLN
20.0000 mg | Freq: Once | INTRAMUSCULAR | Status: AC
  Administered 2014-05-13: 20 mg via INTRAVENOUS

## 2014-05-13 MED ORDER — TEMAZEPAM 30 MG PO CAPS: 30.0000 mg | ORAL_CAPSULE | Freq: Every evening | ORAL | Status: DC | PRN

## 2014-05-13 MED ORDER — DIPHENHYDRAMINE HCL 50 MG/ML IJ SOLN
INTRAMUSCULAR | Status: AC
  Filled 2014-05-13: qty 1

## 2014-05-13 MED ORDER — SODIUM CHLORIDE 0.9 % IV SOLN
Freq: Once | INTRAVENOUS | Status: AC
  Administered 2014-05-13: 13:00:00 via INTRAVENOUS

## 2014-05-13 MED ORDER — PROCHLORPERAZINE EDISYLATE 5 MG/ML IJ SOLN
10.0000 mg | Freq: Four times a day (QID) | INTRAMUSCULAR | Status: DC | PRN
  Administered 2014-05-13: 10 mg via INTRAVENOUS

## 2014-05-13 MED ORDER — PROCHLORPERAZINE EDISYLATE 5 MG/ML IJ SOLN
INTRAMUSCULAR | Status: AC
Start: 2014-05-13 — End: 2014-05-13
  Filled 2014-05-13: qty 2

## 2014-05-13 MED ORDER — FAMOTIDINE IN NACL 20-0.9 MG/50ML-% IV SOLN
20.0000 mg | Freq: Once | INTRAVENOUS | Status: AC
  Administered 2014-05-13: 20 mg via INTRAVENOUS

## 2014-05-13 MED ORDER — PACLITAXEL CHEMO INJECTION 300 MG/50ML
45.0000 mg/m2 | Freq: Once | INTRAVENOUS | Status: AC
  Administered 2014-05-13: 84 mg via INTRAVENOUS
  Filled 2014-05-13: qty 14

## 2014-05-13 MED ORDER — DIPHENHYDRAMINE HCL 50 MG/ML IJ SOLN
50.0000 mg | Freq: Once | INTRAMUSCULAR | Status: AC
  Administered 2014-05-13: 50 mg via INTRAVENOUS

## 2014-05-13 MED ORDER — SODIUM CHLORIDE 0.9 % IV SOLN
222.6000 mg | Freq: Once | INTRAVENOUS | Status: AC
  Administered 2014-05-13: 220 mg via INTRAVENOUS
  Filled 2014-05-13: qty 22

## 2014-05-13 MED ORDER — FAMOTIDINE IN NACL 20-0.9 MG/50ML-% IV SOLN
INTRAVENOUS | Status: AC
  Filled 2014-05-13: qty 50

## 2014-05-13 NOTE — Progress Notes (Addendum)
No images are attached to the encounter. No scans are attached to the encounter. No scans are attached to the encounter. [facility] VISIT PROGRESS NOTE  [name], IBTEHAL, MD [address] [place] [address]  DIAGNOSIS: Primary cancer of right lower lobe of lung   Staging form: Lung, AJCC 7th Edition     Clinical: Stage IIIA (T2a, N2, M0) - Unsigned       Staging comments: Adenocarcinoma   PRIOR THERAPY: none  CURRENT THERAPY: Concurrent chemoradiation with chemotherapy in the form of weekly carboplatin for an AUC of 2 and paclitaxel 45 mg/m given concurrent with radiation  INTERVAL HISTORY: SHANNON BALTHAZAR 58 y.o. female returns for a scheduled regular symptom managment visit for followup of her recently diagnosed stage IIIa (T2a, N2, M0) non-small cell lung cancer adenocarcinoma presenting with a right lower lobe lung mass in addition to mediastinal lymphadenopathy diagnosed in November 2015. She is currently undergoing a course of concurrent chemoradiation. Overall she's tolerating her treatment relatively well. Her primary complaint is that of insomnia. She did not achieve any relief by taking Benadryl even at 50 mg at bedtime. She is also concerned as she has not received an appointment for her MRI of the brain. She denies fever, chills, cough, shortness of breath or hemoptysis. She denied any current nausea, vomiting, diarrhea or constipation. She did have one episode of nausea and vomiting that was well managed with her Compazine. She's not sure if this was related to the chemotherapy or something that she ate. She denied significant weight loss or night sweats. She is currently scheduled for her last fraction of radiation therapy on 06/21/2014.  MEDICAL HISTORY: Past Medical History  Diagnosis Date  . Visual disturbance     Episodic  . History of migraine   . Hypertension   . Thyroid nodule   . Varicose veins     left leg  .  Vitamin D deficiency   . Congenital renal atrophy     left kidney  . Migraine equivalent 11/09/2012  . Degenerative arthritis   . Shortness of breath dyspnea     with exertion  . Wears glasses   . COPD (chronic obstructive pulmonary disease)   . GERD (gastroesophageal reflux disease)     ALLERGIES:  is allergic to advil; hydrocodone-guaifenesin; sulfa antibiotics; plasticized base; and zofran.  MEDICATIONS:  Current Outpatient Prescriptions  Medication Sig Dispense Refill  . CARBOPLATIN IV Inject into the vein once a week.    . fluticasone (FLONASE) 50 MCG/ACT nasal spray Place 2 sprays into both nostrils 2 (two) times daily. 16 g 5  . loratadine (CLARITIN) 10 MG tablet Take 1 tablet (10 mg total) by mouth daily. 30 tablet 5  . omeprazole (PRILOSEC) 20 MG capsule Take 1 capsule (20 mg total) by mouth daily. 60 capsule 3  . PACLitaxel (TAXOL IV) Inject into the vein once a week.    . prochlorperazine (COMPAZINE) 10 MG tablet Take 1 tablet (10 mg total) by mouth every 6 (six) hours as needed for nausea or vomiting. 30 tablet 1  . verapamil (CALAN-SR) 180 MG CR tablet Take 180 mg by mouth at bedtime.    . Vitamin D, Ergocalciferol, (DRISDOL) 50000 UNITS CAPS capsule Take 50,000 Units by mouth. Every other week on mondays  1  . Wound Dressings (RADIAGEL) GEL Apply topically 2 (two) times daily.    [name] Kitchen albuterol (PROVENTIL HFA;VENTOLIN HFA) 108 (90 BASE) MCG/ACT inhaler Inhale 2  puffs into the lungs every 6 (six) hours as needed for wheezing or shortness of breath. (Patient not taking: Reported on 05/13/2014) 1 Inhaler 6  . temazepam (RESTORIL) 30 MG capsule Take 1 capsule (30 mg total) by mouth at bedtime as needed for sleep. 30 capsule 0   No current facility-administered medications for this visit.   Facility-Administered Medications Ordered in Other Visits  Medication Dose Route Frequency Provider Last Rate Last Dose  . 0.9 %  sodium chloride infusion   Intravenous Once Curt Bears, MD       . CARBOplatin (PARAPLATIN) 220 mg in sodium chloride 0.9 % 100 mL chemo infusion  220 mg Intravenous Once Curt Bears, MD      . prochlorperazine (COMPAZINE) injection 10 mg  10 mg Intravenous Q6H PRN Curt Bears, MD   10 mg at 05/13/14 1314    SURGICAL HISTORY:  Past Surgical History  Procedure Laterality Date  . Tonsillectomy    . Cesarean section    . Carpal tunnel release Left   . Colonoscopy w/ biopsies and polypectomy    . Dilation and curettage of uterus    . Video bronchoscopy with endobronchial ultrasound Right 04/17/2014    Procedure: VIDEO BRONCHOSCOPY WITH ENDOBRONCHIAL ULTRASOUND;  Surgeon: Collene Gobble, MD;  Location: [facility];  Service: Thoracic;  Laterality: Right;    REVIEW OF SYSTEMS:  Review of Systems  Constitutional: Negative for fever, chills, weight loss, malaise/fatigue and diaphoresis.  HENT: Negative for congestion, ear discharge, ear pain, hearing loss, nosebleeds, sore throat and tinnitus.   Eyes: Negative for blurred vision, double vision, photophobia, pain, discharge and redness.  Respiratory: Negative for cough, hemoptysis, sputum production, shortness of breath, wheezing and stridor.   Cardiovascular: Negative for chest pain, palpitations, orthopnea, claudication, leg swelling and PND.  Gastrointestinal: Positive for nausea and vomiting. Negative for heartburn, abdominal pain, diarrhea, constipation, blood in stool and melena.  Genitourinary: Negative.   Musculoskeletal: Negative.   Skin: Negative.   Neurological: Negative for dizziness, tingling, focal weakness, seizures, weakness and headaches.  Endo/Heme/Allergies: Does not bruise/bleed easily.  Psychiatric/Behavioral: Negative for depression. The patient has insomnia. The patient is not nervous/anxious.      PHYSICAL EXAMINATION: Physical Exam  Constitutional: She is oriented to person, place, and time and well-developed, well-nourished, and in no distress.  HENT:  Head:  Normocephalic and atraumatic.  Mouth/Throat: Oropharynx is clear and moist.  Eyes: Pupils are equal, round, and reactive to light.  Neck: Normal range of motion. Neck supple. No JVD present. No tracheal deviation present. No thyromegaly present.  Cardiovascular: Normal rate, regular rhythm, normal heart sounds and intact distal pulses.  Exam reveals no gallop and no friction rub.   No murmur heard. Pulmonary/Chest: Effort normal and breath sounds normal. No respiratory distress. She has no wheezes. She has no rales.  Abdominal: Soft. Bowel sounds are normal. She exhibits no distension and no mass. There is no tenderness.  Musculoskeletal: Normal range of motion. She exhibits no edema or tenderness.  Lymphadenopathy:    She has no cervical adenopathy.  Neurological: She is alert and oriented to person, place, and time. She has normal reflexes. Gait normal.  Skin: Skin is warm and dry. No rash noted.    ECOG PERFORMANCE STATUS: 1 - Symptomatic but completely ambulatory  Blood pressure 124/68, pulse 92, temperature 98.2 F (36.8 C), temperature source Oral, resp. rate 17, height 5\' 4"  (1.626 m), weight 160 lb 12.8 oz (72.938 kg), SpO2 96 %.  LABORATORY DATA:  Lab Results  Component Value Date   WBC 5.9 05/13/2014   HGB 12.8 05/13/2014   HCT 39.1 05/13/2014   MCV 90.1 05/13/2014   PLT 262 05/13/2014      Chemistry      Component Value Date/Time   NA 141 05/13/2014 0948   NA 141 04/15/2014 1107   K 4.1 05/13/2014 0948   K 4.4 04/15/2014 1107   CL 103 04/15/2014 1107   CO2 21* 05/13/2014 0948   CO2 25 04/15/2014 1107   BUN 19.8 05/13/2014 0948   BUN 14 04/15/2014 1107   CREATININE 0.7 05/13/2014 0948   CREATININE 0.65 04/15/2014 1107      Component Value Date/Time   CALCIUM 9.5 05/13/2014 0948   CALCIUM 9.5 04/15/2014 1107   ALKPHOS 91 05/13/2014 0948   AST 13 05/13/2014 0948   ALT 18 05/13/2014 0948   BILITOT 0.34 05/13/2014 0948       RADIOGRAPHIC STUDIES:  Nm Pet  Image Initial (pi) Skull Base To Thigh  04/23/2014   CLINICAL DATA:  Initial treatment strategy for non-small cell lung cancer.  EXAM: NUCLEAR MEDICINE PET SKULL BASE TO THIGH  TECHNIQUE: 8.0 mCi F-18 FDG was injected intravenously. Full-ring PET imaging was performed from the skull base to thigh after the radiotracer. CT data was obtained and used for attenuation correction and anatomic localization.  FASTING BLOOD GLUCOSE:  Value: 19 mg/dl  COMPARISON:  CT 04/11/2014.  FINDINGS: NECK  No hypermetabolic lymph nodes in the neck.  CHEST  Right lower lobe infrahilar mass measures 3.7 x 2.4 x 2.3 cm. The SUV max is equal to 9.2. Hypermetabolic sub- carinal lymph node measures 1.5 cm and has an SUV max equal to 6.6.  No pleural effusion identified.  ABDOMEN/PELVIS  No abnormal hypermetabolic activity within the liver, pancreas, adrenal glands, or spleen. No hypermetabolic lymph nodes in the abdomen or pelvis. Solitary left kidney.  SKELETON  No focal hypermetabolic activity to suggest skeletal metastasis.  IMPRESSION: 1. Right lower lobe lung mass is identified and is intensely hypermetabolic compatible with primary bronchogenic carcinoma. This is considered a T2aN2M0 tumor or stage IIIa.   Electronically Signed   By: Kerby Moors M.D.   On: 04/23/2014 15:56     ASSESSMENT/PLAN:  No problem-specific assessment & plan notes found for this encounter.  patient is very pleasant 58 year old Caucasian female recently diagnosed with stage IIIa (T2a, N2, M0) non-small cell lung cancer adenocarcinoma presenting with a right lower lobe lung mass in addition to mediastinal lymphadenopathy. This was diagnosed in November 2015. She is currently undergoing a course of concurrent chemoradiation and tolerating this treatment relatively well. Patient was discussed with and also seen by Dr. Julien Nordmann. For her insomnia she was given a prescription for Restoril 30 mg 1 capsule by mouth at bedtime as needed for insomnia total of 30  with no refill. Place an order for an MRI with and without contrast of her brain to be evaluated for possible brain metastasis to complete her staging workup. Hopefully this can be scheduled later this week. She will continue with her course of concurrent chemoradiation as scheduled and return in 3 weeks for another symptom management visit or sooner if needed.  She was advised to call immediately if she had any concerning symptoms in the interval.  Carlton Adam, PA-C 05/13/2014 All questions were answered. The patient knows to call the clinic with any problems, questions or concerns. We can certainly see the patient much sooner if necessary.  ADDENDUM: Hematology/Oncology Attending: I had a face to face encounter with the patient today. I recommended her care plan. This is a very pleasant 58 years old white female recently diagnosed with a stage IIIa non-small cell lung cancer, adenocarcinoma. She is currently undergoing concurrent chemoradiation with weekly carboplatin and paclitaxel is status post 1 cycle. The patient tolerated the first week of her treatment fairly well with no significant adverse effects. Her MRI of the brain has not been performed yet and we will reschedule it. For insomnia we'll start the patient on Restoril 30 mg by mouth daily at bedtime. I recommended for the patient to proceed with her treatment today as scheduled. She will come back for follow-up visit in 3 weeks for reevaluation and management of any adverse effect of her chemotherapy. The patient was advised to call immediately if she has any concerning symptoms in the interval.  Disclaimer: This note was dictated with voice recognition software. Similar sounding words can inadvertently be transcribed and may be missed upon review. Eilleen Kempf., MD 05/13/2014

## 2014-05-13 NOTE — Telephone Encounter (Signed)
gv adn printed appt sched and avs for pt for Dec and Jan sed added tx.

## 2014-05-13 NOTE — Patient Instructions (Signed)
[facility] Discharge Instructions for Patients Receiving Chemotherapy  Today you received the following chemotherapy agents taxol and Carboplatin  To help prevent nausea and vomiting after your treatment, we encourage you to take your nausea medication Compazine   If you develop nausea and vomiting that is not controlled by your nausea medication, call the clinic.   BELOW ARE SYMPTOMS THAT SHOULD BE REPORTED IMMEDIATELY:  *FEVER GREATER THAN 100.5 F  *CHILLS WITH OR WITHOUT FEVER  NAUSEA AND VOMITING THAT IS NOT CONTROLLED WITH YOUR NAUSEA MEDICATION  *UNUSUAL SHORTNESS OF BREATH  *UNUSUAL BRUISING OR BLEEDING  TENDERNESS IN MOUTH AND THROAT WITH OR WITHOUT PRESENCE OF ULCERS  *URINARY PROBLEMS  *BOWEL PROBLEMS  UNUSUAL RASH Items with * indicate a potential emergency and should be followed up as soon as possible.  Feel free to call the clinic you have any questions or concerns. The clinic phone number is [phone].

## 2014-05-13 NOTE — Telephone Encounter (Signed)
gv and printed appt sched adn avs for pt for Dec and jan 2016...sed added tx.

## 2014-05-13 NOTE — Patient Instructions (Signed)
Continue your course of concurrent chemoradiation scheduled Take the Restoril as prescribed as needed for insomnia Follow up in 3 weeks

## 2014-05-14 ENCOUNTER — Ambulatory Visit
Admission: RE | Admit: 2014-05-14 | Discharge: 2014-05-14 | Disposition: A | Discharge status: Home / Self Care | Payer: Managed Care, Other (non HMO) | Source: Ambulatory Visit | Attending: Radiation Oncology | Admitting: Radiation Oncology

## 2014-05-14 DIAGNOSIS — Z510 Encounter for antineoplastic radiation therapy: Secondary | ICD-10-CM | POA: Diagnosis not present

## 2014-05-15 ENCOUNTER — Encounter: Payer: Self-pay | Admitting: Cardiology

## 2014-05-15 ENCOUNTER — Ambulatory Visit
Admission: RE | Admit: 2014-05-15 | Discharge: 2014-05-15 | Disposition: A | Discharge status: Home / Self Care | Payer: Managed Care, Other (non HMO) | Source: Ambulatory Visit | Attending: Radiation Oncology | Admitting: Radiation Oncology

## 2014-05-15 DIAGNOSIS — Z510 Encounter for antineoplastic radiation therapy: Secondary | ICD-10-CM | POA: Diagnosis not present

## 2014-05-16 ENCOUNTER — Ambulatory Visit
Admission: RE | Admit: 2014-05-16 | Discharge: 2014-05-16 | Disposition: A | Discharge status: Home / Self Care | Payer: Managed Care, Other (non HMO) | Source: Ambulatory Visit | Attending: Radiation Oncology | Admitting: Radiation Oncology

## 2014-05-16 ENCOUNTER — Encounter: Payer: Self-pay | Admitting: Radiation Oncology

## 2014-05-16 VITALS — BP 121/77 | HR 101 | Temp 98.1°F | Resp 12 | Wt 159.5 lb

## 2014-05-16 DIAGNOSIS — C3431 Malignant neoplasm of lower lobe, right bronchus or lung: Secondary | ICD-10-CM

## 2014-05-16 DIAGNOSIS — Z510 Encounter for antineoplastic radiation therapy: Secondary | ICD-10-CM | POA: Diagnosis not present

## 2014-05-16 NOTE — Progress Notes (Signed)
  Radiation Oncology         [phone] ________________________________  Name: [name] [name] MRN: [id number]  Date: 05/16/2014  DOB: 1956/04/11     Weekly Radiation Therapy Management    ICD-9-CM ICD-10-CM   1. Primary cancer of right lower lobe of lung 162.5 C34.31     Current Dose: 10 Gy     Planned Dose:  66 Gy  Narrative . . . . . . . . The patient presents for routine under treatment assessment.                                   Patient education completed with patient my Psychiatric nurse. She gave her "Radiation and You" booklet with all pertinent information marked and discussed, re: fatigue, skin irritation/care, throat irritation/management, nutrition, pain. Gave pt Radiaplex with instructions for proper use. Teach back method used. Patient had no questions. Pt verbalized understanding. Patient denies pain, cough, fatigue, loss of appetite. She has SOB with exertion.                                  Set-up films were reviewed.                                 The chart was checked. Physical Findings. . .  weight is 159 lb 8 oz (72.349 kg). Her oral temperature is 98.1 F (36.7 C). Her blood pressure is 121/77 and her pulse is 101. Her respiration is 12 and oxygen saturation is 100%. . Weight essentially stable.  No significant changes. Impression . . . . . . . The patient is tolerating radiation. Plan . . . . . . . . . . . . Continue treatment as planned.  ________________________________  Sheral Apley. Tammi Klippel, M.D.

## 2014-05-16 NOTE — Progress Notes (Signed)
She is currently in no pain. Pt complains of, Loss of Sleep**Was prescribed Restoril, but she didn't like the Side effects so she isn't taking it* and Fatigue. Shortness of Breath -Walking and Stairs and Coughing -Dry. Pt is on room air. Noted warm, dry and intact. Pt denies dysphagia.

## 2014-05-17 ENCOUNTER — Ambulatory Visit
Admission: RE | Admit: 2014-05-17 | Discharge: 2014-05-17 | Disposition: A | Discharge status: Home / Self Care | Payer: Managed Care, Other (non HMO) | Source: Ambulatory Visit | Attending: Radiation Oncology | Admitting: Radiation Oncology

## 2014-05-17 DIAGNOSIS — Z510 Encounter for antineoplastic radiation therapy: Secondary | ICD-10-CM | POA: Diagnosis not present

## 2014-05-20 ENCOUNTER — Ambulatory Visit
Admission: RE | Admit: 2014-05-20 | Discharge: 2014-05-20 | Disposition: A | Discharge status: Home / Self Care | Payer: Managed Care, Other (non HMO) | Source: Ambulatory Visit | Attending: Radiation Oncology | Admitting: Radiation Oncology

## 2014-05-20 ENCOUNTER — Other Ambulatory Visit (HOSPITAL_BASED_OUTPATIENT_CLINIC_OR_DEPARTMENT_OTHER): Payer: Managed Care, Other (non HMO)

## 2014-05-20 ENCOUNTER — Ambulatory Visit (HOSPITAL_BASED_OUTPATIENT_CLINIC_OR_DEPARTMENT_OTHER): Payer: Managed Care, Other (non HMO)

## 2014-05-20 DIAGNOSIS — Z510 Encounter for antineoplastic radiation therapy: Secondary | ICD-10-CM | POA: Diagnosis not present

## 2014-05-20 DIAGNOSIS — C3431 Malignant neoplasm of lower lobe, right bronchus or lung: Secondary | ICD-10-CM

## 2014-05-20 DIAGNOSIS — Z5111 Encounter for antineoplastic chemotherapy: Secondary | ICD-10-CM

## 2014-05-20 LAB — COMPREHENSIVE METABOLIC PANEL (CC13)
ALBUMIN: 3.5 g/dL (ref 3.5–5.0)
ALT: 18 U/L (ref 0–55)
ANION GAP: 8 meq/L (ref 3–11)
AST: 15 U/L (ref 5–34)
Alkaline Phosphatase: 94 U/L (ref 40–150)
BUN: 12.5 mg/dL (ref 7.0–26.0)
CALCIUM: 9.2 mg/dL (ref 8.4–10.4)
CHLORIDE: 110 meq/L — AB (ref 98–109)
CO2: 22 meq/L (ref 22–29)
Creatinine: 0.7 mg/dL (ref 0.6–1.1)
Glucose: 106 mg/dl (ref 70–140)
POTASSIUM: 3.8 meq/L (ref 3.5–5.1)
SODIUM: 140 meq/L (ref 136–145)
TOTAL PROTEIN: 6.5 g/dL (ref 6.4–8.3)
Total Bilirubin: 0.44 mg/dL (ref 0.20–1.20)

## 2014-05-20 LAB — CBC WITH DIFFERENTIAL/PLATELET
BASO%: 0.2 % (ref 0.0–2.0)
Basophils Absolute: 0 10*3/uL (ref 0.0–0.1)
EOS%: 0.7 % (ref 0.0–7.0)
Eosinophils Absolute: 0 10*3/uL (ref 0.0–0.5)
HCT: 38.4 % (ref 34.8–46.6)
HGB: 12.7 g/dL (ref 11.6–15.9)
LYMPH%: 14.7 % (ref 14.0–49.7)
MCH: 29.3 pg (ref 25.1–34.0)
MCHC: 33.1 g/dL (ref 31.5–36.0)
MCV: 88.7 fL (ref 79.5–101.0)
MONO#: 0.3 10*3/uL (ref 0.1–0.9)
MONO%: 5.6 % (ref 0.0–14.0)
NEUT%: 78.8 % — ABNORMAL HIGH (ref 38.4–76.8)
NEUTROS ABS: 4.7 10*3/uL (ref 1.5–6.5)
Platelets: 250 10*3/uL (ref 145–400)
RBC: 4.33 10*6/uL (ref 3.70–5.45)
RDW: 13 % (ref 11.2–14.5)
WBC: 5.9 10*3/uL (ref 3.9–10.3)
lymph#: 0.9 10*3/uL (ref 0.9–3.3)

## 2014-05-20 MED ORDER — PROCHLORPERAZINE EDISYLATE 5 MG/ML IJ SOLN
10.0000 mg | Freq: Four times a day (QID) | INTRAMUSCULAR | Status: DC | PRN
  Administered 2014-05-20: 10 mg via INTRAVENOUS

## 2014-05-20 MED ORDER — SODIUM CHLORIDE 0.9 % IV SOLN
Freq: Once | INTRAVENOUS | Status: AC
  Administered 2014-05-20: 12:00:00 via INTRAVENOUS

## 2014-05-20 MED ORDER — DIPHENHYDRAMINE HCL 50 MG/ML IJ SOLN
INTRAMUSCULAR | Status: AC
  Filled 2014-05-20: qty 1

## 2014-05-20 MED ORDER — DIPHENHYDRAMINE HCL 50 MG/ML IJ SOLN
50.0000 mg | Freq: Once | INTRAMUSCULAR | Status: AC
  Administered 2014-05-20: 25 mg via INTRAVENOUS

## 2014-05-20 MED ORDER — SODIUM CHLORIDE 0.9 % IV SOLN
222.6000 mg | Freq: Once | INTRAVENOUS | Status: AC
  Administered 2014-05-20: 220 mg via INTRAVENOUS
  Filled 2014-05-20: qty 22

## 2014-05-20 MED ORDER — FAMOTIDINE IN NACL 20-0.9 MG/50ML-% IV SOLN
INTRAVENOUS | Status: AC
  Filled 2014-05-20: qty 50

## 2014-05-20 MED ORDER — PROCHLORPERAZINE EDISYLATE 5 MG/ML IJ SOLN
INTRAMUSCULAR | Status: AC
  Filled 2014-05-20: qty 2

## 2014-05-20 MED ORDER — DEXAMETHASONE SODIUM PHOSPHATE 20 MG/5ML IJ SOLN
INTRAMUSCULAR | Status: AC
  Filled 2014-05-20: qty 5

## 2014-05-20 MED ORDER — FAMOTIDINE IN NACL 20-0.9 MG/50ML-% IV SOLN
20.0000 mg | Freq: Once | INTRAVENOUS | Status: AC
  Administered 2014-05-20: 20 mg via INTRAVENOUS

## 2014-05-20 MED ORDER — DEXAMETHASONE SODIUM PHOSPHATE 20 MG/5ML IJ SOLN
20.0000 mg | Freq: Once | INTRAMUSCULAR | Status: AC
  Administered 2014-05-20: 20 mg via INTRAVENOUS

## 2014-05-20 MED ORDER — PACLITAXEL CHEMO INJECTION 300 MG/50ML
45.0000 mg/m2 | Freq: Once | INTRAVENOUS | Status: AC
  Administered 2014-05-20: 84 mg via INTRAVENOUS
  Filled 2014-05-20: qty 14

## 2014-05-20 MED ORDER — FAMOTIDINE IN NACL 20-0.9 MG/50ML-% IV SOLN
20.0000 mg | Freq: Once | INTRAVENOUS | Status: AC | PRN
  Administered 2014-05-20: 20 mg via INTRAVENOUS

## 2014-05-20 NOTE — Patient Instructions (Signed)
[facility] Discharge Instructions for Patients Receiving Chemotherapy  Today you received the following chemotherapy agents Taxol, Carboplatin  To help prevent nausea and vomiting after your treatment, we encourage you to take your nausea medication as directed.   If you develop nausea and vomiting that is not controlled by your nausea medication, call the clinic.   BELOW ARE SYMPTOMS THAT SHOULD BE REPORTED IMMEDIATELY:  *FEVER GREATER THAN 100.5 F  *CHILLS WITH OR WITHOUT FEVER  NAUSEA AND VOMITING THAT IS NOT CONTROLLED WITH YOUR NAUSEA MEDICATION  *UNUSUAL SHORTNESS OF BREATH  *UNUSUAL BRUISING OR BLEEDING  TENDERNESS IN MOUTH AND THROAT WITH OR WITHOUT PRESENCE OF ULCERS  *URINARY PROBLEMS  *BOWEL PROBLEMS  UNUSUAL RASH Items with * indicate a potential emergency and should be followed up as soon as possible.  Feel free to call the clinic you have any questions or concerns. The clinic phone number is [phone].

## 2014-05-21 ENCOUNTER — Ambulatory Visit
Admission: RE | Admit: 2014-05-21 | Discharge: 2014-05-21 | Disposition: A | Discharge status: Home / Self Care | Payer: Managed Care, Other (non HMO) | Source: Ambulatory Visit | Attending: Physician Assistant | Admitting: Physician Assistant

## 2014-05-21 ENCOUNTER — Ambulatory Visit
Admission: RE | Admit: 2014-05-21 | Discharge: 2014-05-21 | Disposition: A | Discharge status: Home / Self Care | Payer: Managed Care, Other (non HMO) | Source: Ambulatory Visit | Attending: Radiation Oncology | Admitting: Radiation Oncology

## 2014-05-21 DIAGNOSIS — Z510 Encounter for antineoplastic radiation therapy: Secondary | ICD-10-CM | POA: Diagnosis not present

## 2014-05-21 DIAGNOSIS — C3431 Malignant neoplasm of lower lobe, right bronchus or lung: Secondary | ICD-10-CM

## 2014-05-21 MED ORDER — GADOBENATE DIMEGLUMINE 529 MG/ML IV SOLN: 15.0000 mL | Freq: Once | INTRAVENOUS | Status: AC | PRN

## 2014-05-22 ENCOUNTER — Ambulatory Visit
Admission: RE | Admit: 2014-05-22 | Discharge: 2014-05-22 | Disposition: A | Discharge status: Home / Self Care | Payer: Managed Care, Other (non HMO) | Source: Ambulatory Visit | Attending: Radiation Oncology | Admitting: Radiation Oncology

## 2014-05-22 ENCOUNTER — Institutional Professional Consult (permissible substitution): Payer: Managed Care, Other (non HMO) | Admitting: Cardiology

## 2014-05-22 DIAGNOSIS — Z510 Encounter for antineoplastic radiation therapy: Secondary | ICD-10-CM | POA: Diagnosis not present

## 2014-05-23 ENCOUNTER — Telehealth: Payer: Self-pay | Admitting: Medical Oncology

## 2014-05-23 ENCOUNTER — Ambulatory Visit
Admission: RE | Admit: 2014-05-23 | Discharge: 2014-05-23 | Disposition: A | Discharge status: Home / Self Care | Payer: Managed Care, Other (non HMO) | Source: Ambulatory Visit | Attending: Radiation Oncology | Admitting: Radiation Oncology

## 2014-05-23 ENCOUNTER — Encounter: Payer: Self-pay | Admitting: Radiation Oncology

## 2014-05-23 VITALS — BP 136/68 | HR 106 | Resp 18 | Wt 154.0 lb

## 2014-05-23 DIAGNOSIS — C3431 Malignant neoplasm of lower lobe, right bronchus or lung: Secondary | ICD-10-CM

## 2014-05-23 DIAGNOSIS — Z510 Encounter for antineoplastic radiation therapy: Secondary | ICD-10-CM | POA: Diagnosis not present

## 2014-05-23 MED ORDER — SUCRALFATE 1 G PO TABS
1.0000 g | ORAL_TABLET | Freq: Three times a day (TID) | ORAL | Status: DC
Start: 2014-05-23 — End: 2014-07-10

## 2014-05-23 NOTE — Progress Notes (Signed)
  Radiation Oncology         [phone] ________________________________  Name: DAYLIN EADS MRN: [id number]  Date: 05/23/2014  DOB: [birth year]     Weekly Radiation Therapy Management    ICD-9-CM ICD-10-CM   1. Primary cancer of right lower lobe of lung 162.5 C34.31     Current Dose: 28 Gy     Planned Dose:  66 Gy  Narrative . . . . . . . . The patient presents for routine under treatment assessment.                                   Reports frequency and intensity of cough. Reports mild esophagitis. Reports shortness of breath with exertion. Reports mild skin changes to right upper back. Reports discomfort at scapula but, tolerable. Discuss relation of scapula pain to hard treatment table. Reports using radiaplex bid as directed.                                  Set-up films were reviewed.                                 The chart was checked. Physical Findings. . .  weight is 154 lb (69.854 kg). Her blood pressure is 136/68 and her pulse is 106. Her respiration is 18 and oxygen saturation is 97%. . Weight essentially stable.  No significant changes. Impression . . . . . . . The patient is tolerating radiation. Plan . . . . . . . . . . . . Continue treatment as planned.  Will try carafate.  ________________________________  Sheral Apley. Tammi Klippel, M.D.

## 2014-05-23 NOTE — Progress Notes (Addendum)
Reports frequency and intensity of cough. Reports mild esophagitis. Reports shortness of breath with exertion. Reports mild skin changes to right upper back. Reports discomfort at scapula but, tolerable. Discuss relation of scapula pain to hard treatment table. Reports using radiaplex bid as directed.

## 2014-05-23 NOTE — Telephone Encounter (Signed)
Pt asking if she can get her hair roots colored and wants MRI brain report. I left a message that we do not recommend hair coloring treatments,

## 2014-05-24 ENCOUNTER — Telehealth: Payer: Self-pay | Admitting: *Deleted

## 2014-05-24 ENCOUNTER — Telehealth: Payer: Self-pay | Admitting: Radiation Oncology

## 2014-05-24 ENCOUNTER — Ambulatory Visit
Admission: RE | Admit: 2014-05-24 | Discharge: 2014-05-24 | Disposition: A | Discharge status: Home / Self Care | Payer: Managed Care, Other (non HMO) | Source: Ambulatory Visit | Attending: Radiation Oncology | Admitting: Radiation Oncology

## 2014-05-24 DIAGNOSIS — Z510 Encounter for antineoplastic radiation therapy: Secondary | ICD-10-CM | POA: Diagnosis not present

## 2014-05-24 NOTE — Telephone Encounter (Signed)
Received call from patient this morning requesting clarification of carafate prescription. Clarification made. Patient verbalized understanding and expressed appreciation for this writer's time.

## 2014-05-24 NOTE — Telephone Encounter (Signed)
Pt called wanting to know what sleep aid Dr Vista Mink recommends.  She was given a restoril rx but she is nervous taking it.  Called back and left a msg recommending tylenol PM or melatonin.

## 2014-05-27 ENCOUNTER — Ambulatory Visit (HOSPITAL_BASED_OUTPATIENT_CLINIC_OR_DEPARTMENT_OTHER): Payer: Managed Care, Other (non HMO)

## 2014-05-27 ENCOUNTER — Ambulatory Visit
Admission: RE | Admit: 2014-05-27 | Discharge: 2014-05-27 | Disposition: A | Discharge status: Home / Self Care | Payer: Managed Care, Other (non HMO) | Source: Ambulatory Visit | Attending: Radiation Oncology | Admitting: Radiation Oncology

## 2014-05-27 ENCOUNTER — Ambulatory Visit (HOSPITAL_BASED_OUTPATIENT_CLINIC_OR_DEPARTMENT_OTHER): Payer: Managed Care, Other (non HMO) | Admitting: Lab

## 2014-05-27 ENCOUNTER — Other Ambulatory Visit: Payer: Self-pay | Admitting: Internal Medicine

## 2014-05-27 DIAGNOSIS — C3431 Malignant neoplasm of lower lobe, right bronchus or lung: Secondary | ICD-10-CM

## 2014-05-27 DIAGNOSIS — Z510 Encounter for antineoplastic radiation therapy: Secondary | ICD-10-CM | POA: Diagnosis not present

## 2014-05-27 DIAGNOSIS — Z5111 Encounter for antineoplastic chemotherapy: Secondary | ICD-10-CM

## 2014-05-27 LAB — CBC WITH DIFFERENTIAL/PLATELET
BASO%: 0.5 % (ref 0.0–2.0)
Basophils Absolute: 0 10*3/uL (ref 0.0–0.1)
EOS ABS: 0 10*3/uL (ref 0.0–0.5)
EOS%: 0.8 % (ref 0.0–7.0)
HEMATOCRIT: 38.5 % (ref 34.8–46.6)
HEMOGLOBIN: 12.3 g/dL (ref 11.6–15.9)
LYMPH%: 10.4 % — AB (ref 14.0–49.7)
MCH: 28.9 pg (ref 25.1–34.0)
MCHC: 32.1 g/dL (ref 31.5–36.0)
MCV: 90.1 fL (ref 79.5–101.0)
MONO#: 0.4 10*3/uL (ref 0.1–0.9)
MONO%: 8 % (ref 0.0–14.0)
NEUT%: 80.3 % — ABNORMAL HIGH (ref 38.4–76.8)
NEUTROS ABS: 4.3 10*3/uL (ref 1.5–6.5)
PLATELETS: 236 10*3/uL (ref 145–400)
RBC: 4.27 10*6/uL (ref 3.70–5.45)
RDW: 13.2 % (ref 11.2–14.5)
WBC: 5.3 10*3/uL (ref 3.9–10.3)
lymph#: 0.6 10*3/uL — ABNORMAL LOW (ref 0.9–3.3)

## 2014-05-27 LAB — COMPREHENSIVE METABOLIC PANEL (CC13)
ALBUMIN: 3.4 g/dL — AB (ref 3.5–5.0)
ALT: 24 U/L (ref 0–55)
ANION GAP: 9 meq/L (ref 3–11)
AST: 18 U/L (ref 5–34)
Alkaline Phosphatase: 91 U/L (ref 40–150)
BUN: 13.8 mg/dL (ref 7.0–26.0)
CALCIUM: 9.2 mg/dL (ref 8.4–10.4)
CHLORIDE: 111 meq/L — AB (ref 98–109)
CO2: 22 meq/L (ref 22–29)
Creatinine: 0.7 mg/dL (ref 0.6–1.1)
EGFR: >90 mL/min/{1.73_m2} (ref >90)
Glucose: 103 mg/dl (ref 70–140)
Potassium: 3.7 mEq/L (ref 3.5–5.1)
SODIUM: 141 meq/L (ref 136–145)
TOTAL PROTEIN: 6.4 g/dL (ref 6.4–8.3)
Total Bilirubin: 0.4 mg/dL (ref 0.20–1.20)

## 2014-05-27 MED ORDER — PROCHLORPERAZINE EDISYLATE 5 MG/ML IJ SOLN
INTRAMUSCULAR | Status: AC
  Filled 2014-05-27: qty 2

## 2014-05-27 MED ORDER — PROCHLORPERAZINE EDISYLATE 5 MG/ML IJ SOLN
10.0000 mg | Freq: Four times a day (QID) | INTRAMUSCULAR | Status: DC | PRN
  Administered 2014-05-27: 10 mg via INTRAVENOUS

## 2014-05-27 MED ORDER — FAMOTIDINE IN NACL 20-0.9 MG/50ML-% IV SOLN
20.0000 mg | Freq: Once | INTRAVENOUS | Status: AC
  Administered 2014-05-27: 20 mg via INTRAVENOUS

## 2014-05-27 MED ORDER — DEXAMETHASONE SODIUM PHOSPHATE 20 MG/5ML IJ SOLN
20.0000 mg | Freq: Once | INTRAMUSCULAR | Status: AC
  Administered 2014-05-27: 20 mg via INTRAVENOUS

## 2014-05-27 MED ORDER — DIPHENHYDRAMINE HCL 50 MG/ML IJ SOLN
50.0000 mg | Freq: Once | INTRAMUSCULAR | Status: AC
  Administered 2014-05-27: 50 mg via INTRAVENOUS

## 2014-05-27 MED ORDER — PACLITAXEL CHEMO INJECTION 300 MG/50ML
45.0000 mg/m2 | Freq: Once | INTRAVENOUS | Status: AC
  Administered 2014-05-27: 84 mg via INTRAVENOUS
  Filled 2014-05-27: qty 14

## 2014-05-27 MED ORDER — DEXAMETHASONE SODIUM PHOSPHATE 20 MG/5ML IJ SOLN
INTRAMUSCULAR | Status: AC
  Filled 2014-05-27: qty 5

## 2014-05-27 MED ORDER — DIPHENHYDRAMINE HCL 50 MG/ML IJ SOLN
INTRAMUSCULAR | Status: AC
  Filled 2014-05-27: qty 1

## 2014-05-27 MED ORDER — SODIUM CHLORIDE 0.9 % IV SOLN
222.6000 mg | Freq: Once | INTRAVENOUS | Status: AC
  Administered 2014-05-27: 220 mg via INTRAVENOUS
  Filled 2014-05-27: qty 22

## 2014-05-27 MED ORDER — FAMOTIDINE IN NACL 20-0.9 MG/50ML-% IV SOLN
INTRAVENOUS | Status: AC
  Filled 2014-05-27: qty 50

## 2014-05-27 NOTE — Patient Instructions (Signed)
[facility] Discharge Instructions for Patients Receiving Chemotherapy  Today you received the following chemotherapy agents Taxol and Carboplatin.  To help prevent nausea and vomiting after your treatment, we encourage you to take your nausea medication.   If you develop nausea and vomiting that is not controlled by your nausea medication, call the clinic.   BELOW ARE SYMPTOMS THAT SHOULD BE REPORTED IMMEDIATELY:  *FEVER GREATER THAN 100.5 F  *CHILLS WITH OR WITHOUT FEVER  NAUSEA AND VOMITING THAT IS NOT CONTROLLED WITH YOUR NAUSEA MEDICATION  *UNUSUAL SHORTNESS OF BREATH  *UNUSUAL BRUISING OR BLEEDING  TENDERNESS IN MOUTH AND THROAT WITH OR WITHOUT PRESENCE OF ULCERS  *URINARY PROBLEMS  *BOWEL PROBLEMS  UNUSUAL RASH Items with * indicate a potential emergency and should be followed up as soon as possible.  Feel free to call the clinic you have any questions or concerns. The clinic phone number is [phone].    

## 2014-05-28 ENCOUNTER — Ambulatory Visit
Admission: RE | Admit: 2014-05-28 | Discharge: 2014-05-28 | Disposition: A | Discharge status: Home / Self Care | Payer: Managed Care, Other (non HMO) | Source: Ambulatory Visit | Attending: Radiation Oncology | Admitting: Radiation Oncology

## 2014-05-28 DIAGNOSIS — Z510 Encounter for antineoplastic radiation therapy: Secondary | ICD-10-CM | POA: Diagnosis not present

## 2014-05-29 ENCOUNTER — Ambulatory Visit
Admission: RE | Admit: 2014-05-29 | Discharge: 2014-05-29 | Disposition: A | Discharge status: Home / Self Care | Payer: Managed Care, Other (non HMO) | Source: Ambulatory Visit | Attending: Radiation Oncology | Admitting: Radiation Oncology

## 2014-05-29 ENCOUNTER — Encounter: Payer: Self-pay | Admitting: Radiation Oncology

## 2014-05-29 VITALS — BP 133/83 | HR 100 | Temp 98.1°F | Resp 20 | Ht 64.0 in | Wt 159.4 lb

## 2014-05-29 DIAGNOSIS — C3431 Malignant neoplasm of lower lobe, right bronchus or lung: Secondary | ICD-10-CM

## 2014-05-29 DIAGNOSIS — Z510 Encounter for antineoplastic radiation therapy: Secondary | ICD-10-CM | POA: Diagnosis not present

## 2014-05-29 NOTE — Progress Notes (Signed)
Radiation Oncology         [phone] ________________________________  Name: [name] [name] MRN: [id number]  Date: 05/29/2014  DOB: 1955-06-10     Weekly Radiation Therapy Management    ICD-9-CM ICD-10-CM   1. Primary cancer of right lower lobe of lung 162.5 C34.31     Current Dose: 36 Gy     Planned Dose:  66 Gy  Narrative . . . . . . . . The patient presents for routine under treatment assessment.  She denies pain.  She reports the burning in her chest with swallowing is much better.  She is taking Carafate 4 times a day, 45 minutes before meals.  She reports her cough is better.  She reports shortness of breath with activity.  Her oxygen saturation is 100% on room air today.  She reports a good appetite and her weight is stable this week.  She reports having chemotherapy on Monday.   She is using radiaplex twice a day.  She reports fatigue. She is pleased with how well the carafate is working.                                 Set-up films were reviewed.                                 The chart was checked.  Physical Findings. . .  height is 5\' 4"  (1.626 m) and weight is 159 lb 6.4 oz (72.303 kg). Her oral temperature is 98.1 F (36.7 C). Her blood pressure is 133/83 and her pulse is 100. Her respiration is 20 and oxygen saturation is 99%. . Lungs - no rhonchi wheezes or rales  CBC    Component Value Date/Time   WBC 5.3 05/27/2014 1040   WBC 7.7 04/15/2014 1107   RBC 4.27 05/27/2014 1040   RBC 4.66 04/15/2014 1107   HGB 12.3 05/27/2014 1040   HGB 14.1 04/15/2014 1107   HCT 38.5 05/27/2014 1040   HCT 42.5 04/15/2014 1107   PLT 236 05/27/2014 1040   PLT 289 04/15/2014 1107   MCV 90.1 05/27/2014 1040   MCV 91.2 04/15/2014 1107   MCH 28.9 05/27/2014 1040   MCH 30.3 04/15/2014 1107   MCHC 32.1 05/27/2014 1040   MCHC 33.2 04/15/2014 1107   RDW 13.2 05/27/2014 1040   RDW 13.1 04/15/2014 1107   LYMPHSABS 0.6* 05/27/2014 1040   MONOABS 0.4 05/27/2014 1040   EOSABS 0.0  05/27/2014 1040   BASOSABS 0.0 05/27/2014 1040    CMP     Component Value Date/Time   NA 141 05/27/2014 1041   NA 141 04/15/2014 1107   K 3.7 05/27/2014 1041   K 4.4 04/15/2014 1107   CL 103 04/15/2014 1107   CO2 22 05/27/2014 1041   CO2 25 04/15/2014 1107   GLUCOSE 103 05/27/2014 1041   GLUCOSE 113* 04/15/2014 1107   BUN 13.8 05/27/2014 1041   BUN 14 04/15/2014 1107   CREATININE 0.7 05/27/2014 1041   CREATININE 0.65 04/15/2014 1107   CALCIUM 9.2 05/27/2014 1041   CALCIUM 9.5 04/15/2014 1107   PROT 6.4 05/27/2014 1041   ALBUMIN 3.4* 05/27/2014 1041   AST 18 05/27/2014 1041   ALT 24 05/27/2014 1041   ALKPHOS 91 05/27/2014 1041   BILITOT 0.40 05/27/2014 1041   GFRNONAA >90 04/15/2014 1107   GFRAA >  90 04/15/2014 1107      Impression . . . . . . . The patient is tolerating radiation. Plan . . . . . . . . . . . . Continue treatment as planned.   -----------------------------------  Eppie Gibson, MD

## 2014-05-29 NOTE — Progress Notes (Signed)
[name] [name] has completed 18 fractions to her right lung.  She denies pain.  She reports the burning in her chest with swallowing is better.  She is taking Carafate 4 times a day, 45 minutes before meals.  She reports her cough is better.  She reports shortness of breath with activity.  Her oxygen saturation is 100% on room air today.  She reports a good appetite and her weight is stable this week.  She reports having chemotherapy on Monday.  She has a slight raised rash on her right mid back.  She is using radiaplex twice a day.  She reports fatigue.

## 2014-05-30 ENCOUNTER — Ambulatory Visit
Admission: RE | Admit: 2014-05-30 | Discharge: 2014-05-30 | Disposition: A | Discharge status: Home / Self Care | Payer: Managed Care, Other (non HMO) | Source: Ambulatory Visit | Attending: Radiation Oncology | Admitting: Radiation Oncology

## 2014-05-30 DIAGNOSIS — Z510 Encounter for antineoplastic radiation therapy: Secondary | ICD-10-CM | POA: Diagnosis not present

## 2014-06-03 ENCOUNTER — Other Ambulatory Visit: Payer: Managed Care, Other (non HMO)

## 2014-06-03 ENCOUNTER — Encounter: Payer: Self-pay | Admitting: Physician Assistant

## 2014-06-03 ENCOUNTER — Ambulatory Visit (HOSPITAL_BASED_OUTPATIENT_CLINIC_OR_DEPARTMENT_OTHER): Payer: Managed Care, Other (non HMO)

## 2014-06-03 ENCOUNTER — Ambulatory Visit
Admission: RE | Admit: 2014-06-03 | Discharge: 2014-06-03 | Disposition: A | Discharge status: Home / Self Care | Payer: Managed Care, Other (non HMO) | Source: Ambulatory Visit | Attending: Radiation Oncology | Admitting: Radiation Oncology

## 2014-06-03 ENCOUNTER — Ambulatory Visit (HOSPITAL_BASED_OUTPATIENT_CLINIC_OR_DEPARTMENT_OTHER): Payer: Managed Care, Other (non HMO) | Admitting: Physician Assistant

## 2014-06-03 ENCOUNTER — Other Ambulatory Visit (HOSPITAL_BASED_OUTPATIENT_CLINIC_OR_DEPARTMENT_OTHER): Payer: Managed Care, Other (non HMO)

## 2014-06-03 VITALS — BP 131/67 | HR 98 | Temp 97.5°F | Resp 18 | Ht 64.0 in | Wt 160.0 lb

## 2014-06-03 DIAGNOSIS — G4700 Insomnia, unspecified: Secondary | ICD-10-CM

## 2014-06-03 DIAGNOSIS — C3431 Malignant neoplasm of lower lobe, right bronchus or lung: Secondary | ICD-10-CM

## 2014-06-03 DIAGNOSIS — Z510 Encounter for antineoplastic radiation therapy: Secondary | ICD-10-CM | POA: Diagnosis not present

## 2014-06-03 DIAGNOSIS — Z5111 Encounter for antineoplastic chemotherapy: Secondary | ICD-10-CM

## 2014-06-03 DIAGNOSIS — R112 Nausea with vomiting, unspecified: Secondary | ICD-10-CM

## 2014-06-03 LAB — CBC WITH DIFFERENTIAL/PLATELET
BASO%: 0.7 % (ref 0.0–2.0)
BASOS ABS: 0 10*3/uL (ref 0.0–0.1)
EOS ABS: 0 10*3/uL (ref 0.0–0.5)
EOS%: 0.9 % (ref 0.0–7.0)
HEMATOCRIT: 36.6 % (ref 34.8–46.6)
HEMOGLOBIN: 12 g/dL (ref 11.6–15.9)
LYMPH#: 0.5 10*3/uL — AB (ref 0.9–3.3)
LYMPH%: 10.9 % — AB (ref 14.0–49.7)
MCH: 29.5 pg (ref 25.1–34.0)
MCHC: 32.9 g/dL (ref 31.5–36.0)
MCV: 89.7 fL (ref 79.5–101.0)
MONO#: 0.4 10*3/uL (ref 0.1–0.9)
MONO%: 7.7 % (ref 0.0–14.0)
NEUT%: 79.8 % — AB (ref 38.4–76.8)
NEUTROS ABS: 4 10*3/uL (ref 1.5–6.5)
PLATELETS: 200 10*3/uL (ref 145–400)
RBC: 4.08 10*6/uL (ref 3.70–5.45)
RDW: 13.6 % (ref 11.2–14.5)
WBC: 5 10*3/uL (ref 3.9–10.3)

## 2014-06-03 LAB — COMPREHENSIVE METABOLIC PANEL (CC13)
ALBUMIN: 3.5 g/dL (ref 3.5–5.0)
ALK PHOS: 91 U/L (ref 40–150)
ALT: 28 U/L (ref 0–55)
AST: 19 U/L (ref 5–34)
Anion Gap: 7 mEq/L (ref 3–11)
BUN: 11.8 mg/dL (ref 7.0–26.0)
CALCIUM: 9.2 mg/dL (ref 8.4–10.4)
CHLORIDE: 110 meq/L — AB (ref 98–109)
CO2: 24 meq/L (ref 22–29)
Creatinine: 0.6 mg/dL (ref 0.6–1.1)
EGFR: >90 mL/min/{1.73_m2} (ref >90)
Glucose: 111 mg/dl (ref 70–140)
Potassium: 3.5 mEq/L (ref 3.5–5.1)
Sodium: 141 mEq/L (ref 136–145)
TOTAL PROTEIN: 6.2 g/dL — AB (ref 6.4–8.3)
Total Bilirubin: 0.36 mg/dL (ref 0.20–1.20)

## 2014-06-03 MED ORDER — SODIUM CHLORIDE 0.9 % IV SOLN
222.6000 mg | Freq: Once | INTRAVENOUS | Status: AC
  Administered 2014-06-03: 220 mg via INTRAVENOUS
  Filled 2014-06-03: qty 22

## 2014-06-03 MED ORDER — DIPHENHYDRAMINE HCL 50 MG/ML IJ SOLN
12.5000 mg | Freq: Once | INTRAMUSCULAR | Status: AC
  Administered 2014-06-03: 12.5 mg via INTRAVENOUS

## 2014-06-03 MED ORDER — SODIUM CHLORIDE 0.9 % IV SOLN
Freq: Once | INTRAVENOUS | Status: AC
  Administered 2014-06-03: 13:00:00 via INTRAVENOUS

## 2014-06-03 MED ORDER — PACLITAXEL CHEMO INJECTION 300 MG/50ML
45.0000 mg/m2 | Freq: Once | INTRAVENOUS | Status: AC
  Administered 2014-06-03: 84 mg via INTRAVENOUS
  Filled 2014-06-03: qty 14

## 2014-06-03 MED ORDER — TEMAZEPAM 15 MG PO CAPS: 15.0000 mg | ORAL_CAPSULE | Freq: Every evening | ORAL | Status: DC | PRN

## 2014-06-03 MED ORDER — DEXAMETHASONE SODIUM PHOSPHATE 20 MG/5ML IJ SOLN
INTRAMUSCULAR | Status: AC
  Filled 2014-06-03: qty 5

## 2014-06-03 MED ORDER — FAMOTIDINE IN NACL 20-0.9 MG/50ML-% IV SOLN
INTRAVENOUS | Status: AC
  Filled 2014-06-03: qty 50

## 2014-06-03 MED ORDER — PROCHLORPERAZINE EDISYLATE 5 MG/ML IJ SOLN
10.0000 mg | Freq: Four times a day (QID) | INTRAMUSCULAR | Status: DC | PRN
  Administered 2014-06-03: 10 mg via INTRAVENOUS

## 2014-06-03 MED ORDER — DIPHENHYDRAMINE HCL 50 MG/ML IJ SOLN
INTRAMUSCULAR | Status: AC
  Filled 2014-06-03: qty 1

## 2014-06-03 MED ORDER — FAMOTIDINE IN NACL 20-0.9 MG/50ML-% IV SOLN
20.0000 mg | Freq: Once | INTRAVENOUS | Status: AC
  Administered 2014-06-03: 20 mg via INTRAVENOUS

## 2014-06-03 MED ORDER — PROCHLORPERAZINE EDISYLATE 5 MG/ML IJ SOLN
INTRAMUSCULAR | Status: AC
  Filled 2014-06-03: qty 2

## 2014-06-03 MED ORDER — DEXAMETHASONE SODIUM PHOSPHATE 20 MG/5ML IJ SOLN
20.0000 mg | Freq: Once | INTRAMUSCULAR | Status: AC
  Administered 2014-06-03: 20 mg via INTRAVENOUS

## 2014-06-03 NOTE — Progress Notes (Addendum)
No images are attached to the encounter. No scans are attached to the encounter. No scans are attached to the encounter. [facility] SHARED VISIT PROGRESS NOTE  [name] [name], IBTEHAL, MD [address] [place] [address]  DIAGNOSIS: Primary cancer of right lower lobe of lung   Staging form: Lung, AJCC 7th Edition     Clinical: Stage IIIA (T2a, N2, M0) - Unsigned       Staging comments: Adenocarcinoma  Foundation 1 molecular studies: positive for [id number], KRASG13C, [id number]*, [id number]*11, GATA6 amplification and [id number] Negative for BRAF, ALK, MET, RET and ERBB2  PRIOR THERAPY: none  CURRENT THERAPY: Concurrent chemoradiation with chemotherapy in the form of weekly carboplatin for an AUC of 2 and paclitaxel 45 mg/m given concurrent with radiation  INTERVAL HISTORY: [name] CLENDENNING58y.o. female returns for a scheduled regular symptom managment visit for followup of her recently diagnosed stage IIIa (T2a, N2, M0) non-small cell lung cancer adenocarcinoma presenting with a right lower lobe lung mass in addition to mediastinal lymphadenopathy diagnosed in November 2015. She is currently undergoing a course of concurrent chemoradiation. Overall she's tolerating her treatment relatively well. She continues to have issues with insomnia. At her last visit we prescribed Restoril 30 mg by mouth at bedtime. Patient states that she read the side effect profile had concerns about sleepwalking and other side effects that were listed. She did not take medication at all. She complains that her hair has started falling out/thinning. She continues to have some increased insomnia due to the dexamethasone in her premedications. She denies fever, chills, cough, shortness of breath or hemoptysis. She denied any current nausea, vomiting, diarrhea or constipation.  She denied significant weight loss or night sweats. She is currently scheduled for her last fraction of  radiation therapy on 06/21/2014.  MEDICAL HISTORY: Past Medical History  Diagnosis Date  . Visual disturbance     Episodic  . History of migraine   . Hypertension   . Thyroid nodule   . Varicose veins     left leg  . Vitamin D deficiency   . Congenital renal atrophy     left kidney  . Migraine equivalent 11/09/2012  . Degenerative arthritis   . Shortness of breath dyspnea     with exertion  . Wears glasses   . COPD (chronic obstructive pulmonary disease)   . GERD (gastroesophageal reflux disease)     ALLERGIES:  is allergic to advil; hydrocodone-guaifenesin; sulfa antibiotics; plasticized base; and zofran.  MEDICATIONS:  Current Outpatient Prescriptions  Medication Sig Dispense Refill  . CARBOPLATIN IV Inject into the vein once a week.    . fluticasone (FLONASE) 50 MCG/ACT nasal spray Place 2 sprays into both nostrils 2 (two) times daily. 16 g 5  . loratadine (CLARITIN) 10 MG tablet Take 1 tablet (10 mg total) by mouth daily. 30 tablet 5  . PACLitaxel (TAXOL IV) Inject into the vein once a week.    . prochlorperazine (COMPAZINE) 10 MG tablet Take 1 tablet (10 mg total) by mouth every 6 (six) hours as needed for nausea or vomiting. 30 tablet 1  . sucralfate (CARAFATE) 1 G tablet Take 1 tablet (1 g total) by mouth 4 (four) times daily -  with meals and at bedtime. 5 min before meals for radiation induced esophagitis 120 tablet 2  . verapamil (CALAN-SR) 180 MG CR tablet Take 180 mg by mouth at bedtime.    . Wound Dressings (RADIAGEL) GEL Apply topically  2 (two) times daily.    [name] Kitchen albuterol (PROVENTIL HFA;VENTOLIN HFA) 108 (90 BASE) MCG/ACT inhaler Inhale 2 puffs into the lungs every 6 (six) hours as needed for wheezing or shortness of breath. (Patient not taking: Reported on 06/03/2014) 1 Inhaler 6  . omeprazole (PRILOSEC) 20 MG capsule Take 1 capsule (20 mg total) by mouth daily. (Patient not taking: Reported on 06/03/2014) 60 capsule 3  . temazepam (RESTORIL) 15 MG capsule Take 1  capsule (15 mg total) by mouth at bedtime as needed for sleep. 30 capsule 0  . Vitamin D, Ergocalciferol, (DRISDOL) 50000 UNITS CAPS capsule Take 50,000 Units by mouth. Every other week on mondays  1   No current facility-administered medications for this visit.   Facility-Administered Medications Ordered in Other Visits  Medication Dose Route Frequency Provider Last Rate Last Dose  . CARBOplatin (PARAPLATIN) 220 mg in sodium chloride 0.9 % 100 mL chemo infusion  220 mg Intravenous Once Curt Bears, MD      . PACLitaxel (TAXOL) 84 mg in dextrose 5 % 250 mL chemo infusion (</= 22m/m2)  45 mg/m2 (Treatment Plan Actual) Intravenous Once MCurt Bears MD      . prochlorperazine (COMPAZINE) injection 10 mg  10 mg Intravenous Q6H PRN MCurt Bears MD   10 mg at 06/03/14 1301    SURGICAL HISTORY:  Past Surgical History  Procedure Laterality Date  . Tonsillectomy    . Cesarean section    . Carpal tunnel release Left   . Colonoscopy w/ biopsies and polypectomy    . Dilation and curettage of uterus    . Video bronchoscopy with endobronchial ultrasound Right 04/17/2014    Procedure: VIDEO BRONCHOSCOPY WITH ENDOBRONCHIAL ULTRASOUND;  Surgeon: RCollene Gobble MD;  Location: [facility]  Service: Thoracic;  Laterality: Right;    REVIEW OF SYSTEMS:  Review of Systems  Constitutional: Negative for fever, chills, weight loss, malaise/fatigue and diaphoresis.  HENT: Negative for congestion, ear discharge, ear pain, hearing loss, nosebleeds, sore throat and tinnitus.        Reports hair loss/thinning  Eyes: Negative for blurred vision, double vision, photophobia, pain, discharge and redness.  Respiratory: Negative for cough, hemoptysis, sputum production, shortness of breath, wheezing and stridor.   Cardiovascular: Negative for chest pain, palpitations, orthopnea, claudication, leg swelling and PND.  Gastrointestinal: Positive for nausea and vomiting. Negative for heartburn, abdominal pain,  diarrhea, constipation, blood in stool and melena.  Genitourinary: Negative.   Musculoskeletal: Negative.   Skin: Negative.   Neurological: Negative for dizziness, tingling, focal weakness, seizures, weakness and headaches.  Endo/Heme/Allergies: Does not bruise/bleed easily.  Psychiatric/Behavioral: Negative for depression. The patient has insomnia. The patient is not nervous/anxious.      PHYSICAL EXAMINATION: Physical Exam  Constitutional: She is oriented to person, place, and time and well-developed, well-nourished, and in no distress.  HENT:  Head: Normocephalic and atraumatic.  Mouth/Throat: Oropharynx is clear and moist.  Eyes: Pupils are equal, round, and reactive to light.  Neck: Normal range of motion. Neck supple. No JVD present. No tracheal deviation present. No thyromegaly present.  Cardiovascular: Normal rate, regular rhythm, normal heart sounds and intact distal pulses.  Exam reveals no gallop and no friction rub.   No murmur heard. Pulmonary/Chest: Effort normal and breath sounds normal. No respiratory distress. She has no wheezes. She has no rales.  Abdominal: Soft. Bowel sounds are normal. She exhibits no distension and no mass. There is no tenderness.  Musculoskeletal: Normal range of motion. She exhibits no edema  or tenderness.  Lymphadenopathy:    She has no cervical adenopathy.  Neurological: She is alert and oriented to person, place, and time. She has normal reflexes. Gait normal.  Skin: Skin is warm and dry. No rash noted.    ECOG PERFORMANCE STATUS: 1 - Symptomatic but completely ambulatory  Blood pressure 131/67, pulse 98, temperature 97.5 F (36.4 C), temperature source Oral, resp. rate 18, height 5' 4"  (1.626 m), weight 160 lb (72.576 kg), SpO2 100 %.  LABORATORY DATA: Lab Results  Component Value Date   WBC 5.0 06/03/2014   HGB 12.0 06/03/2014   HCT 36.6 06/03/2014   MCV 89.7 06/03/2014   PLT 200 06/03/2014      Chemistry      Component Value  Date/Time   NA 141 06/03/2014 1024   NA 141 04/15/2014 1107   K 3.5 06/03/2014 1024   K 4.4 04/15/2014 1107   CL 103 04/15/2014 1107   CO2 24 06/03/2014 1024   CO2 25 04/15/2014 1107   BUN 11.8 06/03/2014 1024   BUN 14 04/15/2014 1107   CREATININE 0.6 06/03/2014 1024   CREATININE 0.65 04/15/2014 1107      Component Value Date/Time   CALCIUM 9.2 06/03/2014 1024   CALCIUM 9.5 04/15/2014 1107   ALKPHOS 91 06/03/2014 1024   AST 19 06/03/2014 1024   ALT 28 06/03/2014 1024   BILITOT 0.36 06/03/2014 1024       RADIOGRAPHIC STUDIES:  Mr Jeri Cos FX Contrast  05/21/2014   CLINICAL DATA:  58 year old female with new diagnosis of lung cancer in November. Staging. Subsequent encounter.  EXAM: MRI HEAD WITHOUT AND WITH CONTRAST  TECHNIQUE: Multiplanar, multiecho pulse sequences of the brain and surrounding structures were obtained without and with intravenous contrast.  CONTRAST:  15 mL MultiHance.  COMPARISON:  Brain MRI without and with contrast 12/01/2011.  FINDINGS: No abnormal enhancement identified. No midline shift, mass effect, or evidence of intracranial mass lesion.  Stable and normal cerebral volume. Major intracranial vascular flow voids are stable. No restricted diffusion to suggest acute infarction. No ventriculomegaly, extra-axial collection or acute intracranial hemorrhage. Cervicomedullary junction and pituitary are within normal limits. Visualized bone marrow signal is within normal limits. Negative visualized cervical spine. Stable and normal gray and white matter signal throughout the brain.  Visible internal auditory structures appear normal. Mastoids are clear. Visualized scalp soft tissues are within normal limits. Trace paranasal sinus mucosal thickening. Visualized orbit soft tissues are within normal limits.  IMPRESSION: No acute or metastatic intracranial abnormality. Stable and normal MRI appearance of the brain.   Electronically Signed   By: Lars Pinks M.D.   On: 05/21/2014  15:15     ASSESSMENT/PLAN:  No problem-specific assessment & plan notes found for this encounter.  patient is very pleasant 58 year old Caucasian female recently diagnosed with stage IIIa (T2a, N2, M0) non-small cell lung cancer adenocarcinoma presenting with a right lower lobe lung mass in addition to mediastinal lymphadenopathy. This was diagnosed in November 2015. She is currently undergoing a course of concurrent chemoradiation and tolerating this treatment relatively well. Patient was discussed with and also seen by Dr. Julien Nordmann. For her insomnia she was given a prescription for Restoril 15 mg 1 capsule by mouth at bedtime as needed for insomnia total of 30 with no refill. She is willing to try the lower dose. The MRI of her brain on 05/21/2014 was negative for any brain metastasis. Her Foundation one molecular studies revealed [id number] as well as K-ras G13C  mutations. She will continue with her course of concurrent chemoradiation. She'll follow-up in 2 weeks for another symptom management visit.   She was advised to call immediately if she had any concerning symptoms in the interval.  Carlton Adam, PA-C 06/03/2014 All questions were answered. The patient knows to call the clinic with any problems, questions or concerns. We can certainly see the patient much sooner if necessary.  ADDENDUM: Hematology/Oncology Attending: I had a face to face encounter with the patient today. I recommended her care plan. 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 She is currently undergoing a course of concurrent chemoradiation with weekly carboplatin and paclitaxel and tolerating her treatment fairly well except for mild alopecia. She status post 4 cycles. I recommended for the patient to proceed with cycle #5 today  as scheduled. She will come back for follow-up visit in 2 weeks for reevaluation and to the end of her treatment to schedule repeat imaging studies. She was advised to call immediately if she has any concerning symptoms in the interval.  Disclaimer: This note was dictated with voice recognition software. Similar sounding words can inadvertently be transcribed and may be missed upon review. Eilleen Kempf., MD 06/03/2014

## 2014-06-03 NOTE — Patient Instructions (Signed)
Continue labs chemotherapy and radiation as scheduled Follow-up in 2 weeks For your insomnia try the Restoril 15 mg by mouth at bedtime

## 2014-06-03 NOTE — Patient Instructions (Signed)
[facility] Discharge Instructions for Patients Receiving Chemotherapy  Today you received the following chemotherapy agents: Taxol and Carboplatin.  To help prevent nausea and vomiting after your treatment, we encourage you to take your nausea medication as prescribed.   If you develop nausea and vomiting that is not controlled by your nausea medication, call the clinic.   BELOW ARE SYMPTOMS THAT SHOULD BE REPORTED IMMEDIATELY:  *FEVER GREATER THAN 100.5 F  *CHILLS WITH OR WITHOUT FEVER  NAUSEA AND VOMITING THAT IS NOT CONTROLLED WITH YOUR NAUSEA MEDICATION  *UNUSUAL SHORTNESS OF BREATH  *UNUSUAL BRUISING OR BLEEDING  TENDERNESS IN MOUTH AND THROAT WITH OR WITHOUT PRESENCE OF ULCERS  *URINARY PROBLEMS  *BOWEL PROBLEMS  UNUSUAL RASH Items with * indicate a potential emergency and should be followed up as soon as possible.  Feel free to call the clinic you have any questions or concerns. The clinic phone number is [phone].    

## 2014-06-04 ENCOUNTER — Ambulatory Visit
Admission: RE | Admit: 2014-06-04 | Discharge: 2014-06-04 | Disposition: A | Discharge status: Home / Self Care | Payer: Managed Care, Other (non HMO) | Source: Ambulatory Visit | Attending: Radiation Oncology | Admitting: Radiation Oncology

## 2014-06-04 DIAGNOSIS — Z510 Encounter for antineoplastic radiation therapy: Secondary | ICD-10-CM | POA: Diagnosis not present

## 2014-06-05 ENCOUNTER — Encounter: Payer: Self-pay | Admitting: Radiation Oncology

## 2014-06-05 ENCOUNTER — Other Ambulatory Visit: Payer: Self-pay | Admitting: *Deleted

## 2014-06-05 ENCOUNTER — Telehealth: Payer: Self-pay | Admitting: Radiation Oncology

## 2014-06-05 ENCOUNTER — Telehealth: Payer: Self-pay | Admitting: Internal Medicine

## 2014-06-05 ENCOUNTER — Telehealth: Payer: Self-pay | Admitting: *Deleted

## 2014-06-05 ENCOUNTER — Ambulatory Visit
Admission: RE | Admit: 2014-06-05 | Discharge: 2014-06-05 | Disposition: A | Discharge status: Home / Self Care | Payer: Managed Care, Other (non HMO) | Source: Ambulatory Visit | Attending: Radiation Oncology | Admitting: Radiation Oncology

## 2014-06-05 VITALS — BP 130/72 | HR 47 | Resp 16

## 2014-06-05 DIAGNOSIS — C3431 Malignant neoplasm of lower lobe, right bronchus or lung: Secondary | ICD-10-CM

## 2014-06-05 DIAGNOSIS — Z510 Encounter for antineoplastic radiation therapy: Secondary | ICD-10-CM | POA: Diagnosis not present

## 2014-06-05 NOTE — Telephone Encounter (Signed)
Called patient to inform of CT on 07-03-14 and her re-eval with Dr. Charlaine Dalton on 06-19-14 - arrival time - 10:15 am with Dr. Nils Pyle- address- [address]. - [phone], spoke with patient and she is aware of these appts.

## 2014-06-05 NOTE — Telephone Encounter (Signed)
Received inbasket from Dr. Tammi Klippel requesting patient have CT chest on 07/03/14.  I called [facility] to quest but due to her insurance, they will not cover it.  The scheduler stated only [facility] take her insurance.   I called [facility] and they are unsure if her insurance will cover CT Chest exam.  I called patient and she stated that they do not cover that insurance.  She asked that I call [facility] to schedule, I did.

## 2014-06-05 NOTE — Telephone Encounter (Signed)
Called [facility] to schedule appt for 07/03/14 at 8:00.  I called and gave [name] [name] the appt time and place.   [name] [name] also asked that she be scheduled with Dr. Julien Nordmann to go over lab results.  I stated I would follow up with Dr. Julien Nordmann

## 2014-06-05 NOTE — Progress Notes (Signed)
  Radiation Oncology         [phone]) [phone] ________________________________  Name: [name] [name] MRN: [id number]  Date: 06/05/2014  DOB: [birth year]     Weekly Radiation Therapy Management    ICD-9-CM ICD-10-CM   1. Primary cancer of right lower lobe of lung 162.5 C34.31     Current Dose: 44 Gy     Planned Dose:  66 Gy  Narrative . . . . . . . . The patient presents for routine under treatment assessment.                                   Reports overall frequency and intensity of cough has improved. Reports intermittent dry cough. Reports SOB with exertion later in the day. Mild hyperpigmentation of chest and back. Reports using radiaplex bid as directed. Reports scapula pain resolved. Reports taking carafate qid to relieve esophagitis. Weight and vitals stable. Mild fatigue..                                   Set-up films were reviewed.  Her tumor is showing some early signs of shrinkage which is encouraging toward possible resection.                                  The chart was checked.  Physical Findings. . .  blood pressure is 130/72 and her pulse is 47. Her respiration is 16 and oxygen saturation is 97%. . Weight essentially stable.  No significant changes.  Impression . . . . . . . The patient is tolerating radiation.  Plan . . . . . . . . . . . . Since the patient's tumor appears to be showing some regression, I think it is reasonable to discontinue radiation now at 44 Gy in anticipation of possible lobectomy.  Presumably, we will also discontinue chemo.  She will need CT chest with contrast in appoximately 4 weeks followed by presentation in the [facility] conference and simultaneous/subsequent re-evaluation with Dr. Darcey Nora for consideration of Bronch EBUS to check subcarinal node response.  If the node is negative, she may be a candidate for lobectomy and node dissection.  If the node remains positive, we will plan a radiation boost at that  time.   ________________________________  Sheral Apley Tammi Klippel, M.D.

## 2014-06-05 NOTE — Telephone Encounter (Signed)
Patient phoned questioning if she should continue carafate and radiaplex since Dr. Tammi Klippel stopped her radiation today. Explained she should continue the carafate until her symptoms resolved. Also, instructed patient to continue using radiaplex bid for two weeks. Patient understands to contact staff with future needs.

## 2014-06-05 NOTE — Telephone Encounter (Signed)
returned pt call and confirmed appts....pt ok and aweare

## 2014-06-05 NOTE — Progress Notes (Addendum)
Reports overall frequency and intensity of cough has improved. Reports intermittent dry cough. Reports SOB with exertion later in the day. Mild hyperpigmentation of chest and back. Reports using radiaplex bid as directed. Reports scapula pain resolved. Reports taking carafate qid to relieve esophagitis. Weight and vitals stable. Mild fatigue.

## 2014-06-06 ENCOUNTER — Ambulatory Visit: Payer: Managed Care, Other (non HMO)

## 2014-06-07 NOTE — Progress Notes (Signed)
  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  6, 10, and 15 MV photons were used.  Narrative: The patient tolerated radiation treatment relatively well.   She used carafate for esophagitis and had a minor cough.  Plan: The patient has completed radiation treatment. The patient will return to radiation oncology clinic for routine followup in one month. I advised them to call or return sooner if they have any questions or concerns related to their recovery or treatment. ________________________________  Sheral Apley. Tammi Klippel, M.D.

## 2014-06-10 ENCOUNTER — Other Ambulatory Visit: Payer: Managed Care, Other (non HMO)

## 2014-06-10 ENCOUNTER — Ambulatory Visit: Payer: Managed Care, Other (non HMO)

## 2014-06-11 ENCOUNTER — Ambulatory Visit: Payer: Managed Care, Other (non HMO)

## 2014-06-12 ENCOUNTER — Encounter: Payer: Self-pay | Admitting: Cardiology

## 2014-06-12 ENCOUNTER — Ambulatory Visit: Payer: Managed Care, Other (non HMO)

## 2014-06-13 ENCOUNTER — Telehealth: Payer: Self-pay | Admitting: *Deleted

## 2014-06-13 ENCOUNTER — Ambulatory Visit: Payer: Managed Care, Other (non HMO)

## 2014-06-13 NOTE — Telephone Encounter (Signed)
Pt called wanting to know when her hair will stop falling out, when it will begin to grow back, if she can get a dental cleaning and if she can get her hair colored.  Per Dr Vista Mink, in the next 4-8 weeks she should start to see increased hair growth begin, she can get a routine dental cleaning and informed her that in the next few weeks it is fine to get her hair colored.  She verbalized understanding

## 2014-06-14 ENCOUNTER — Ambulatory Visit: Payer: Managed Care, Other (non HMO)

## 2014-06-17 ENCOUNTER — Ambulatory Visit: Payer: Managed Care, Other (non HMO)

## 2014-06-17 ENCOUNTER — Ambulatory Visit (HOSPITAL_BASED_OUTPATIENT_CLINIC_OR_DEPARTMENT_OTHER): Payer: Managed Care, Other (non HMO) | Admitting: Physician Assistant

## 2014-06-17 ENCOUNTER — Encounter: Payer: Self-pay | Admitting: Physician Assistant

## 2014-06-17 ENCOUNTER — Other Ambulatory Visit (HOSPITAL_BASED_OUTPATIENT_CLINIC_OR_DEPARTMENT_OTHER): Payer: Managed Care, Other (non HMO)

## 2014-06-17 ENCOUNTER — Other Ambulatory Visit: Payer: Managed Care, Other (non HMO)

## 2014-06-17 ENCOUNTER — Telehealth: Payer: Self-pay | Admitting: Physician Assistant

## 2014-06-17 VITALS — BP 139/79 | HR 103 | Temp 98.1°F | Resp 17 | Wt 152.5 lb

## 2014-06-17 DIAGNOSIS — C3431 Malignant neoplasm of lower lobe, right bronchus or lung: Secondary | ICD-10-CM

## 2014-06-17 LAB — COMPREHENSIVE METABOLIC PANEL (CC13)
ALK PHOS: 125 U/L (ref 40–150)
ALT: 26 U/L (ref 0–55)
ANION GAP: 8 meq/L (ref 3–11)
AST: 24 U/L (ref 5–34)
Albumin: 3.6 g/dL (ref 3.5–5.0)
BILIRUBIN TOTAL: 0.68 mg/dL (ref 0.20–1.20)
BUN: 7.8 mg/dL (ref 7.0–26.0)
CO2: 27 mEq/L (ref 22–29)
Calcium: 9.4 mg/dL (ref 8.4–10.4)
Chloride: 108 mEq/L (ref 98–109)
Creatinine: 0.8 mg/dL (ref 0.6–1.1)
EGFR: 83 mL/min/{1.73_m2} — ABNORMAL LOW (ref >90)
Glucose: 141 mg/dl — ABNORMAL HIGH (ref 70–140)
POTASSIUM: 4.1 meq/L (ref 3.5–5.1)
Sodium: 143 mEq/L (ref 136–145)
Total Protein: 6.8 g/dL (ref 6.4–8.3)

## 2014-06-17 LAB — CBC WITH DIFFERENTIAL/PLATELET
BASO%: 0.7 % (ref 0.0–2.0)
Basophils Absolute: 0 10*3/uL (ref 0.0–0.1)
EOS%: 0.9 % (ref 0.0–7.0)
Eosinophils Absolute: 0 10*3/uL (ref 0.0–0.5)
HCT: 39.6 % (ref 34.8–46.6)
HEMOGLOBIN: 12.7 g/dL (ref 11.6–15.9)
LYMPH%: 8.3 % — ABNORMAL LOW (ref 14.0–49.7)
MCH: 29.4 pg (ref 25.1–34.0)
MCHC: 32.1 g/dL (ref 31.5–36.0)
MCV: 91.6 fL (ref 79.5–101.0)
MONO#: 0.3 10*3/uL (ref 0.1–0.9)
MONO%: 8.2 % (ref 0.0–14.0)
NEUT%: 81.9 % — ABNORMAL HIGH (ref 38.4–76.8)
NEUTROS ABS: 3.4 10*3/uL (ref 1.5–6.5)
Platelets: 204 10*3/uL (ref 145–400)
RBC: 4.33 10*6/uL (ref 3.70–5.45)
RDW: 14.9 % — ABNORMAL HIGH (ref 11.2–14.5)
WBC: 4.2 10*3/uL (ref 3.9–10.3)
lymph#: 0.3 10*3/uL — ABNORMAL LOW (ref 0.9–3.3)

## 2014-06-17 NOTE — Progress Notes (Addendum)
No images are attached to the encounter. No scans are attached to the encounter. No scans are attached to the encounter. [facility] SHARED VISIT PROGRESS NOTE  [name], IBTEHAL, MD [place]  [address] [place] [address]  DIAGNOSIS: Primary cancer of right lower lobe of lung   Staging form: Lung, AJCC 7th Edition     Clinical: Stage IIIA (T2a, N2, M0) - Unsigned       Staging comments: Adenocarcinoma  Foundation 1 molecular studies: positive for [id number], KRASG13C, [id number]*, [id number]*11, GATA6 amplification and [id number] Negative for BRAF, ALK, MET, RET and ERBB2  PRIOR THERAPY: Concurrent chemoradiation with chemotherapy in the form of weekly carboplatin for an AUC of 2 and paclitaxel 45 mg/m given concurrent with radiation  CURRENT THERAPY:  none  INTERVAL HISTORY: ETRAVONNA SWINDLE59y.o. female returns for a scheduled regular symptom managment visit for followup of her recently diagnosed stage IIIa (T2a, N2, M0) non-small cell lung cancer adenocarcinoma presenting with a right lower lobe lung mass in addition to mediastinal lymphadenopathy diagnosed in November 2015. She recently completed her course of concurrent chemoradiation. She had her last weekly chemotherapy on 06/03/2014 and her last fraction of radiation on 06/05/2014. She reports having some difficulty taking a deep breath but denies having any wheezing or significant cough. She reports that she is scheduled to have a restaging CT scan on 07/03/2014 followed by an appointment with Dr. VDarcey Noraon the same day. She continues to have some stomach issues and states that she had stopped taking the omeprazole that she was originally prescribed. She also notes some occasional bloody nasal secretions particularly in the mornings. She had been started on Flonase and other allergy medications that she also had stopped. She otherwise is feeling relatively well. She does note some decreased appetite  and has lost a few pounds since her last office visit. She denies fever, chills, cough, shortness of breath or hemoptysis. She denied any current nausea, vomiting, diarrhea or constipation.  She denied night sweats.   MEDICAL HISTORY: Past Medical History  Diagnosis Date  . Visual disturbance     Episodic  . History of migraine   . Hypertension   . Thyroid nodule   . Varicose veins     left leg  . Vitamin D deficiency   . Congenital renal atrophy     left kidney  . Migraine equivalent 11/09/2012  . Degenerative arthritis   . Shortness of breath dyspnea     with exertion  . Wears glasses   . COPD (chronic obstructive pulmonary disease)   . GERD (gastroesophageal reflux disease)     ALLERGIES:  is allergic to advil; hydrocodone-guaifenesin; sulfa antibiotics; plasticized base; and zofran.  MEDICATIONS:  Current Outpatient Prescriptions  Medication Sig Dispense Refill  . CARBOPLATIN IV Inject into the vein once a week.    .[name] KitchenPACLitaxel (TAXOL IV) Inject into the vein once a week.    . verapamil (CALAN-SR) 180 MG CR tablet Take 180 mg by mouth at bedtime.    . Vitamin D, Ergocalciferol, (DRISDOL) 50000 UNITS CAPS capsule Take 50,000 Units by mouth. Every other week on mondays  1  . Wound Dressings (RADIAGEL) GEL Apply topically 2 (two) times daily.    .[name] Kitchenalbuterol (PROVENTIL HFA;VENTOLIN HFA) 108 (90 BASE) MCG/ACT inhaler Inhale 2 puffs into the lungs every 6 (six) hours as needed for wheezing or shortness of breath. (Patient not taking: Reported on 06/17/2014) 1 Inhaler 6  . fluticasone (  FLONASE) 50 MCG/ACT nasal spray Place 2 sprays into both nostrils 2 (two) times daily. (Patient not taking: Reported on 06/17/2014) 16 g 5  . loratadine (CLARITIN) 10 MG tablet Take 1 tablet (10 mg total) by mouth daily. (Patient not taking: Reported on 06/17/2014) 30 tablet 5  . omeprazole (PRILOSEC) 20 MG capsule Take 1 capsule (20 mg total) by mouth daily. (Patient not taking: Reported on 06/17/2014) 60  capsule 3  . prochlorperazine (COMPAZINE) 10 MG tablet Take 1 tablet (10 mg total) by mouth every 6 (six) hours as needed for nausea or vomiting. (Patient not taking: Reported on 06/17/2014) 30 tablet 1  . sucralfate (CARAFATE) 1 G tablet Take 1 tablet (1 g total) by mouth 4 (four) times daily -  with meals and at bedtime. 5 min before meals for radiation induced esophagitis (Patient not taking: Reported on 06/17/2014) 120 tablet 2  . temazepam (RESTORIL) 15 MG capsule Take 1 capsule (15 mg total) by mouth at bedtime as needed for sleep. (Patient not taking: Reported on 06/17/2014) 30 capsule 0  . temazepam (RESTORIL) 30 MG capsule Take 30 mg by mouth at bedtime as needed. for sleep  0   No current facility-administered medications for this visit.    SURGICAL HISTORY:  Past Surgical History  Procedure Laterality Date  . Tonsillectomy    . Cesarean section    . Carpal tunnel release Left   . Colonoscopy w/ biopsies and polypectomy    . Dilation and curettage of uterus    . Video bronchoscopy with endobronchial ultrasound Right 04/17/2014    Procedure: VIDEO BRONCHOSCOPY WITH ENDOBRONCHIAL ULTRASOUND;  Surgeon: Collene Gobble, MD;  Location: [facility];  Service: Thoracic;  Laterality: Right;    REVIEW OF SYSTEMS:  Review of Systems  Constitutional: Negative for fever, chills, weight loss, malaise/fatigue and diaphoresis.  HENT: Negative for congestion, ear discharge, ear pain, hearing loss, nosebleeds, sore throat and tinnitus.        Reports hair loss/thinning. Occasional morning blood tinged nasal secretions  Eyes: Negative for blurred vision, double vision, photophobia, pain, discharge and redness.  Respiratory: Negative for cough, hemoptysis, sputum production, shortness of breath, wheezing and stridor.   Cardiovascular: Negative for chest pain, palpitations, orthopnea, claudication, leg swelling and PND.  Gastrointestinal: Positive for nausea and vomiting. Negative for heartburn, abdominal  pain, diarrhea, constipation, blood in stool and melena.       Acid reflux symptoms  Genitourinary: Negative.   Musculoskeletal: Negative.   Skin: Negative.   Neurological: Negative for dizziness, tingling, focal weakness, seizures, weakness and headaches.  Endo/Heme/Allergies: Does not bruise/bleed easily.  Psychiatric/Behavioral: Negative for depression. The patient has insomnia. The patient is not nervous/anxious.      PHYSICAL EXAMINATION: Physical Exam  Constitutional: She is oriented to person, place, and time and well-developed, well-nourished, and in no distress.  HENT:  Head: Normocephalic and atraumatic.  Mouth/Throat: Oropharynx is clear and moist.  Eyes: Pupils are equal, round, and reactive to light.  Neck: Normal range of motion. Neck supple. No JVD present. No tracheal deviation present. No thyromegaly present.  Cardiovascular: Normal rate, regular rhythm, normal heart sounds and intact distal pulses.  Exam reveals no gallop and no friction rub.   No murmur heard. Pulmonary/Chest: Effort normal and breath sounds normal. No respiratory distress. She has no wheezes. She has no rales.  Abdominal: Soft. Bowel sounds are normal. She exhibits no distension and no mass. There is no tenderness.  Musculoskeletal: Normal range of motion. She exhibits  no edema or tenderness.  Lymphadenopathy:    She has no cervical adenopathy.  Neurological: She is alert and oriented to person, place, and time. She has normal reflexes. Gait normal.  Skin: Skin is warm and dry. No rash noted.    ECOG PERFORMANCE STATUS: 1 - Symptomatic but completely ambulatory  Blood pressure 139/79, pulse 103, temperature 98.1 F (36.7 C), resp. rate 17, weight 152 lb 8 oz (69.174 kg).  LABORATORY DATA: Lab Results  Component Value Date   WBC 4.2 06/17/2014   HGB 12.7 06/17/2014   HCT 39.6 06/17/2014   MCV 91.6 06/17/2014   PLT 204 06/17/2014      Chemistry      Component Value Date/Time   NA 143  06/17/2014 1147   NA 141 04/15/2014 1107   K 4.1 06/17/2014 1147   K 4.4 04/15/2014 1107   CL 103 04/15/2014 1107   CO2 27 06/17/2014 1147   CO2 25 04/15/2014 1107   BUN 7.8 06/17/2014 1147   BUN 14 04/15/2014 1107   CREATININE 0.8 06/17/2014 1147   CREATININE 0.65 04/15/2014 1107      Component Value Date/Time   CALCIUM 9.4 06/17/2014 1147   CALCIUM 9.5 04/15/2014 1107   ALKPHOS 125 06/17/2014 1147   AST 24 06/17/2014 1147   ALT 26 06/17/2014 1147   BILITOT 0.68 06/17/2014 1147       RADIOGRAPHIC STUDIES:  Mr Jeri Cos Wo Contrast  05/21/2014   CLINICAL DATA:  59 year old female with new diagnosis of lung cancer in November. Staging. Subsequent encounter.  EXAM: MRI HEAD WITHOUT AND WITH CONTRAST  TECHNIQUE: Multiplanar, multiecho pulse sequences of the brain and surrounding structures were obtained without and with intravenous contrast.  CONTRAST:  15 mL MultiHance.  COMPARISON:  Brain MRI without and with contrast 12/01/2011.  FINDINGS: No abnormal enhancement identified. No midline shift, mass effect, or evidence of intracranial mass lesion.  Stable and normal cerebral volume. Major intracranial vascular flow voids are stable. No restricted diffusion to suggest acute infarction. No ventriculomegaly, extra-axial collection or acute intracranial hemorrhage. Cervicomedullary junction and pituitary are within normal limits. Visualized bone marrow signal is within normal limits. Negative visualized cervical spine. Stable and normal gray and white matter signal throughout the brain.  Visible internal auditory structures appear normal. Mastoids are clear. Visualized scalp soft tissues are within normal limits. Trace paranasal sinus mucosal thickening. Visualized orbit soft tissues are within normal limits.  IMPRESSION: No acute or metastatic intracranial abnormality. Stable and normal MRI appearance of the brain.   Electronically Signed   By: Lars Pinks M.D.   On: 05/21/2014 15:15      ASSESSMENT/PLAN:  No problem-specific assessment & plan notes found for this encounter.  patient is very pleasant 59 year old Caucasian female recently diagnosed with stage IIIa (T2a, N2, M0) non-small cell lung cancer adenocarcinoma presenting with a right lower lobe lung mass in addition to mediastinal lymphadenopathy. This was diagnosed in November 2015. She is currently undergoing a course of concurrent chemoradiation and tolerating this treatment relatively well. Patient was discussed with and also seen by Dr. Julien Nordmann. For her insomnia she will continue Restoril 15 mg 1 capsule by mouth at bedtime as needed. The MRI of her brain on 05/21/2014 was negative for any brain metastasis. Her Foundation one molecular studies revealed [id number] as well as K-ras G13C mutations. She completed her course of concurrent chemoradiation as scheduled. She will proceed with her restaging CT scan of the chest as scheduled on 07/03/2014 and  keep her appointment with Dr. Lucianne Lei try regarding possible surgical resection also on 07/03/2014. She'll follow-up with Dr. Julien Nordmann the week of 07/08/2014 to discuss any further treatment options if needed if surgical resection is not an option. Her nasal membranes patient was advised to try nasal saline to keep her membranes moist. Regarding her systemic issues patient was advised to restart her omeprazole.   She was advised to call immediately if she had any concerning symptoms in the interval. All questions were answered. The patient knows to call the clinic with any problems, questions or concerns. We can certainly see the patient much sooner if necessary.  Carlton Adam, PA-C 06/17/2014  ADDENDUM:  Hematology/Oncology Attending:  I had a face to face encounter with the patient today. I recommended her care plan. This is a very pleasant 59 years old white female with stage IIIa non-small cell lung cancer who recently completed a course of concurrent chemoradiation with  weekly carboplatin and paclitaxel. She tolerated the previous course of her concurrent chemoradiation fairly well except for mild dysphagia and lack of appetite. The patient will have repeat CT scan of the chest later this month for restaging of her disease before seeing Dr. Prescott Gum for consideration of surgical resection if she has good response to this treatment. I would see her back for follow-up visit in one month's for reevaluation and discussion of other treatment options if she is not candidate for surgical resection. She was advised to call immediately if she has any concerning symptoms in the interval.  Eilleen Kempf., MD 06/17/2014

## 2014-06-17 NOTE — Patient Instructions (Signed)
Keep your appointments for your restaging CT scan of the head and with surgery as scheduled Follow-up with Dr. Julien Nordmann in 3 weeks

## 2014-06-17 NOTE — Telephone Encounter (Signed)
Pt confirmed labs/ov per 01/11 POF, gave pt AVS..... KJ  °

## 2014-06-18 ENCOUNTER — Ambulatory Visit: Payer: Managed Care, Other (non HMO)

## 2014-06-19 ENCOUNTER — Ambulatory Visit: Payer: Managed Care, Other (non HMO) | Admitting: Cardiothoracic Surgery

## 2014-06-19 ENCOUNTER — Ambulatory Visit: Payer: Managed Care, Other (non HMO)

## 2014-06-20 ENCOUNTER — Telehealth: Payer: Self-pay | Admitting: Medical Oncology

## 2014-06-20 ENCOUNTER — Ambulatory Visit: Payer: Managed Care, Other (non HMO)

## 2014-06-20 NOTE — Telephone Encounter (Signed)
Returned call and left her lab results on a phone message  and mailed her a copy.

## 2014-06-21 ENCOUNTER — Ambulatory Visit: Payer: Managed Care, Other (non HMO)

## 2014-06-25 ENCOUNTER — Telehealth: Payer: Self-pay | Admitting: Emergency Medicine

## 2014-06-25 NOTE — Telephone Encounter (Signed)
Spoke with Dominica Severin with [employer].  He states that pt's insurance is denying labs that were done for EBUS procedure on 04/16/15.  He is wanting to know if the labs were precerted.  Spoke with [facility] and she states that she contacted [facility] regarding precert for Ebus but they stated that precert was not needed.  I informed Dominica Severin of this and he states that he will use this info for his appeal and nothing further is needed at this time.

## 2014-07-03 ENCOUNTER — Encounter: Payer: Self-pay | Admitting: Cardiothoracic Surgery

## 2014-07-03 ENCOUNTER — Ambulatory Visit (INDEPENDENT_AMBULATORY_CARE_PROVIDER_SITE_OTHER): Payer: Managed Care, Other (non HMO) | Admitting: Cardiothoracic Surgery

## 2014-07-03 ENCOUNTER — Inpatient Hospital Stay: Admission: RE | Admit: 2014-07-03 | Payer: Managed Care, Other (non HMO) | Source: Ambulatory Visit

## 2014-07-03 ENCOUNTER — Ambulatory Visit: Payer: Managed Care, Other (non HMO) | Admitting: Cardiothoracic Surgery

## 2014-07-03 ENCOUNTER — Other Ambulatory Visit: Payer: Self-pay | Admitting: *Deleted

## 2014-07-03 ENCOUNTER — Ambulatory Visit
Admission: RE | Admit: 2014-07-03 | Discharge: 2014-07-03 | Disposition: A | Discharge status: Home / Self Care | Payer: Managed Care, Other (non HMO) | Source: Ambulatory Visit | Attending: Radiation Oncology | Admitting: Radiation Oncology

## 2014-07-03 VITALS — BP 130/75 | HR 100 | Resp 16 | Ht 65.75 in | Wt 148.0 lb

## 2014-07-03 DIAGNOSIS — R599 Enlarged lymph nodes, unspecified: Secondary | ICD-10-CM

## 2014-07-03 DIAGNOSIS — C3431 Malignant neoplasm of lower lobe, right bronchus or lung: Secondary | ICD-10-CM

## 2014-07-03 DIAGNOSIS — R590 Localized enlarged lymph nodes: Secondary | ICD-10-CM

## 2014-07-03 DIAGNOSIS — R591 Generalized enlarged lymph nodes: Secondary | ICD-10-CM

## 2014-07-03 MED ORDER — IOHEXOL 300 MG/ML  SOLN
75.0000 mL | Freq: Once | INTRAMUSCULAR | Status: AC | PRN
  Administered 2014-07-03: 75 mL via INTRAVENOUS

## 2014-07-03 NOTE — Progress Notes (Signed)
PCP is [name] Stagers, MD Referring Provider is [name] Paula, MD  Chief Complaint  Patient presents with  . Lung Cancer    has had chemoradiation...eval for BRONCH/EBUS  . 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 She's completed a course of chemotherapy-radiation from late November 2015- late December 2015. She's had some mild skin irritation mild esophagitis and mild weight loss as tolerated the therapy well. Last CBC-January 11, 30 BC 4.4, hemoglobin 12.7, platelet 140 5K.  Repeat CT chest to restage her tumor post therapy shows good response with approximately 50% reduction mass of both the primary right lower lobe lesion and the subcarinal adenopathy.  Patient presents for consideration of resection. This would be proceeded by bronchoscopy-EBUS of subcarinal lymph nodes to document the original stage III classification  has improved. Brain MRI performed December 2015 was negative.   Past Medical History  Diagnosis Date  . Visual disturbance     Episodic  . History of migraine   . Hypertension   . Thyroid nodule   . Varicose veins     left leg  . Vitamin D deficiency   . Congenital renal atrophy     left kidney  . Migraine equivalent 11/09/2012  . Degenerative arthritis   . Shortness of breath dyspnea     with exertion  . Wears glasses   . COPD (chronic obstructive pulmonary disease)   . GERD (gastroesophageal reflux disease)     Past Surgical History  Procedure Laterality Date  . Tonsillectomy    . Cesarean section    . Carpal tunnel release Left   . Colonoscopy w/ biopsies and polypectomy    . Dilation and curettage of uterus    . Video bronchoscopy with endobronchial ultrasound Right 04/17/2014    Procedure: VIDEO BRONCHOSCOPY WITH ENDOBRONCHIAL ULTRASOUND;  Surgeon: [name] Gobble, MD;  Location: [facility];   Service: Thoracic;  Laterality: Right;    Family History  Problem Relation Age of Onset  . Cancer Mother     cancer of the small intestines  . Hypertension Father   . Atrial fibrillation Father   . Diverticulitis Father   . Hypertension Sister   . Diverticulitis Brother   . Hypertension Sister   . Headache Sister     Social History History  Substance Use Topics  . Smoking status: Former Smoker -- 0.50 packs/day for 40 years    Types: Cigarettes    Quit date: 03/17/2013  . Smokeless tobacco: Never Used  . Alcohol Use: 0.0 oz/week    0 Not specified per week     Comment: " rare"    Current Outpatient Prescriptions  Medication Sig Dispense Refill  . omeprazole (PRILOSEC) 20 MG capsule Take 1 capsule (20 mg total) by mouth daily. 60 capsule 3  . verapamil (CALAN-SR) 180 MG CR tablet Take 180 mg by mouth at bedtime.    . Vitamin D, Ergocalciferol, (DRISDOL) 50000 UNITS CAPS capsule Take 50,000 Units by mouth. Every other week on mondays  1  . albuterol (PROVENTIL HFA;VENTOLIN HFA) 108 (90 BASE) MCG/ACT inhaler Inhale 2 puffs into the lungs every 6 (six) hours as needed for wheezing or shortness of breath. (Patient not taking: Reported on 06/17/2014) 1 Inhaler 6  . CARBOPLATIN IV Inject into the vein once a week.    . fluticasone (FLONASE) 50 MCG/ACT nasal spray Place  2 sprays into both nostrils 2 (two) times daily. (Patient not taking: Reported on 06/17/2014) 16 g 5  . loratadine (CLARITIN) 10 MG tablet Take 1 tablet (10 mg total) by mouth daily. (Patient not taking: Reported on 06/17/2014) 30 tablet 5  . PACLitaxel (TAXOL IV) Inject into the vein once a week.    . prochlorperazine (COMPAZINE) 10 MG tablet Take 1 tablet (10 mg total) by mouth every 6 (six) hours as needed for nausea or vomiting. (Patient not taking: Reported on 06/17/2014) 30 tablet 1  . sucralfate (CARAFATE) 1 G tablet Take 1 tablet (1 g total) by mouth 4 (four) times daily -  with meals and at bedtime. 5 min before  meals for radiation induced esophagitis (Patient not taking: Reported on 06/17/2014) 120 tablet 2  . temazepam (RESTORIL) 15 MG capsule Take 1 capsule (15 mg total) by mouth at bedtime as needed for sleep. (Patient not taking: Reported on 06/17/2014) 30 capsule 0  . temazepam (RESTORIL) 30 MG capsule Take 30 mg by mouth at bedtime as needed. for sleep  0  . Wound Dressings (RADIAGEL) GEL Apply topically 2 (two) times daily.     No current facility-administered medications for this visit.    Allergies  Allergen Reactions  . Advil [Ibuprofen] Other (See Comments)    Causes hematuria advil syrup causes itching  . Hydrocodone-Guaifenesin Itching  . Sulfa Antibiotics Hives and Swelling  . Plasticized Base [Plastibase] Rash  . Zofran [Ondansetron Hcl] Palpitations    Review of Systems    General:   Normal appetite,  + mild weight loss, no fever or night sweats Cardiac:      - Chest pain with exertion,   -chest pain at rest,   -SOB with exertion,                   - PND,   -orthopnea, +  palpitations or arrhythmias,     -history atrial fibrillation                 - dizzy spells-presyncope,   -Syncope,- LE edema Respiratory: +Shortness of breath,  no home oxygen, no productive cough, no                  sleep apnea, no CPAP at night, no hemoptysis, no COPD GI:           mild difficulty swallowing, no reflux, no hiatal hernia-heartburn, no chronic                          abdominal pain, no hematochezia, no hematemesis, no melena GU:        No dysuria, no frequency, no UTI recently, no hematuria, no kidney stones,               No BPH Vascular: No pain suggestive of claudication, no varicose veins, no DVT, no nonhealing                Foot ulcer, no rest pain suggestive of ischemia Neuro:   No stroke, no TIAs, no seizures, no neuropathy, no gait instability, no                             memory/cognitive dysfunction                Musculoskeletal:  No arthritis, no joint swelling, no  difficulty walking, no decreased  Mobility Skin:     No rash, no ulcerations or pressure sores Psych:    No anxiety, no depression, Eyes:    No change in vision, no amaurosis, no eye surgery ENT:    No hearing loss, no loose or painful teeth, no dentures, no recent dental   procedure Hematologic:  No easy bruising, no bleeding disorder, no frequent epistaxes Endocrine:  No diabetes,  - checks CBG at home  The patient states that when she had general anesthesia for bronchoscopy in November she had severe bronchospasm following procedure as well as prolonged sedation which required an overnight observation  BP 130/75 mmHg  Pulse 100  Resp 16  Ht 5' 5.75" (1.67 m)  Wt 148 lb (67.132 kg)  BMI 24.07 kg/m2  SpO2 97% Physical Exam  General:  HEENT: Normocephalic pupils equal , dentition adequate Neck: Supple without JVD, adenopathy, or bruit Chest: Clear to auscultation, symmetrical breath sounds, no rhonchi, no tenderness             or deformity Cardiovascular: Regular rate and rhythm, no murmur, no gallop, peripheral pulses             palpable in all extremities Abdomen:  Soft, nontender, no palpable mass or organomegaly Extremities: Warm, well-perfused, no clubbing cyanosis edema or tenderness,              no venous stasis changes of the legs Rectal/GU: Deferred Neuro: Grossly non--focal and symmetrical throughout Skin: Clean and dry without rash or ulceration   Diagnostic Tests: CT scans from pre-chemoradiation and the scan today were reviewed in the exam room with the patient and her husband demonstrating the CT images of the primary tumor in the subcarinal adenopathy. There is clear improvement in both following up from therapy.  Impression:stage III adenocarcinoma right lower lobe with response to chemoradiation. Patient appears to be candidate for resection but will proceed with Bronch-EBUS to document subcarinal lymph node station is tumor  free   Plan:bronchoscopy with lymph node biopsy scheduled for Monday, February 1. We'll review pathology with patient on Wednesday, February 3 and hopefully schedule lobectomy and lymph node dissection.

## 2014-07-05 ENCOUNTER — Telehealth: Payer: Self-pay | Admitting: *Deleted

## 2014-07-05 ENCOUNTER — Encounter (HOSPITAL_COMMUNITY)
Admission: RE | Admit: 2014-07-05 | Discharge: 2014-07-05 | Disposition: A | Discharge status: Home / Self Care | Payer: Managed Care, Other (non HMO) | Source: Ambulatory Visit | Attending: Cardiothoracic Surgery | Admitting: Cardiothoracic Surgery

## 2014-07-05 ENCOUNTER — Other Ambulatory Visit: Payer: Self-pay | Admitting: Medical Oncology

## 2014-07-05 ENCOUNTER — Encounter (HOSPITAL_COMMUNITY): Payer: Self-pay

## 2014-07-05 ENCOUNTER — Ambulatory Visit (HOSPITAL_COMMUNITY)
Admission: RE | Admit: 2014-07-05 | Discharge: 2014-07-05 | Disposition: A | Discharge status: Home / Self Care | Payer: Managed Care, Other (non HMO) | Source: Ambulatory Visit | Attending: Cardiothoracic Surgery | Admitting: Cardiothoracic Surgery

## 2014-07-05 VITALS — BP 142/77 | HR 71 | Temp 98.6°F | Resp 18 | Ht 65.75 in | Wt 150.5 lb

## 2014-07-05 DIAGNOSIS — K219 Gastro-esophageal reflux disease without esophagitis: Secondary | ICD-10-CM | POA: Diagnosis not present

## 2014-07-05 DIAGNOSIS — F419 Anxiety disorder, unspecified: Secondary | ICD-10-CM | POA: Diagnosis not present

## 2014-07-05 DIAGNOSIS — C3431 Malignant neoplasm of lower lobe, right bronchus or lung: Secondary | ICD-10-CM | POA: Diagnosis present

## 2014-07-05 DIAGNOSIS — R591 Generalized enlarged lymph nodes: Secondary | ICD-10-CM

## 2014-07-05 DIAGNOSIS — J449 Chronic obstructive pulmonary disease, unspecified: Secondary | ICD-10-CM | POA: Diagnosis not present

## 2014-07-05 DIAGNOSIS — R599 Enlarged lymph nodes, unspecified: Secondary | ICD-10-CM

## 2014-07-05 DIAGNOSIS — M1990 Unspecified osteoarthritis, unspecified site: Secondary | ICD-10-CM | POA: Diagnosis not present

## 2014-07-05 DIAGNOSIS — I10 Essential (primary) hypertension: Secondary | ICD-10-CM | POA: Diagnosis not present

## 2014-07-05 DIAGNOSIS — Z87891 Personal history of nicotine dependence: Secondary | ICD-10-CM | POA: Diagnosis not present

## 2014-07-05 HISTORY — DX: Ventricular premature depolarization: I49.3

## 2014-07-05 HISTORY — DX: Other specified postprocedural states: Z98.890

## 2014-07-05 HISTORY — DX: Other complications of anesthesia, initial encounter: T88.59XA

## 2014-07-05 HISTORY — DX: Other specified postprocedural states: R11.2

## 2014-07-05 HISTORY — DX: Adverse effect of unspecified anesthetic, initial encounter: T41.45XA

## 2014-07-05 HISTORY — DX: Malignant (primary) neoplasm, unspecified: C80.1

## 2014-07-05 HISTORY — DX: Anxiety disorder, unspecified: F41.9

## 2014-07-05 HISTORY — DX: Nausea with vomiting, unspecified: R11.2

## 2014-07-05 LAB — COMPREHENSIVE METABOLIC PANEL
ALT: 14 U/L (ref 0–35)
AST: 20 U/L (ref 0–37)
Albumin: 3.6 g/dL (ref 3.5–5.2)
Alkaline Phosphatase: 111 U/L (ref 39–117)
Anion gap: 4 — ABNORMAL LOW (ref 5–15)
BUN: 8 mg/dL (ref 6–23)
CO2: 29 mmol/L (ref 19–32)
Calcium: 9.5 mg/dL (ref 8.4–10.5)
Chloride: 104 mmol/L (ref 96–112)
Creatinine, Ser: 0.73 mg/dL (ref 0.50–1.10)
GFR calc Af Amer: >90 mL/min (ref >90)
GFR calc non Af Amer: >90 mL/min (ref >90)
Glucose, Bld: 131 mg/dL — ABNORMAL HIGH (ref 70–99)
Potassium: 3.8 mmol/L (ref 3.5–5.1)
Sodium: 137 mmol/L (ref 135–145)
Total Bilirubin: 0.3 mg/dL (ref 0.3–1.2)
Total Protein: 6.9 g/dL (ref 6.0–8.3)

## 2014-07-05 LAB — CBC
HCT: 39.7 % (ref 36.0–46.0)
Hemoglobin: 13.1 g/dL (ref 12.0–15.0)
MCH: 30 pg (ref 26.0–34.0)
MCHC: 33 g/dL (ref 30.0–36.0)
MCV: 91.1 fL (ref 78.0–100.0)
Platelets: 255 10*3/uL (ref 150–400)
RBC: 4.36 MIL/uL (ref 3.87–5.11)
RDW: 14.4 % (ref 11.5–15.5)
WBC: 5.3 10*3/uL (ref 4.0–10.5)

## 2014-07-05 LAB — PROTIME-INR
INR: 1.06 (ref 0.00–1.49)
Prothrombin Time: 13.9 seconds (ref 11.6–15.2)

## 2014-07-05 LAB — APTT: aPTT: 30 seconds (ref 24–37)

## 2014-07-05 NOTE — Progress Notes (Signed)
Primary - [facility]elf[facility] Cardiologist - dr. Thayer Jew and ekg from 2015 in epic. Saw dr. Tamala Julian for prolonged qt - was normal at [facility] office

## 2014-07-05 NOTE — Pre-Procedure Instructions (Signed)
[name] [name]  07/05/2014   Your procedure is scheduled on:  Monday, February 1st  Report to [facility] Admitting at 530 AM.  Call this number if you have problems the morning of surgery: [phone]   Remember:   Do not eat food or drink liquids after midnight.   Take these medicines the morning of surgery with A SIP OF WATER: prilosec   Do not wear jewelry, make-up or nail polish.  Do not wear lotions, powders, or perfume,deodorant.  Do not shave 48 hours prior to surgery. Men may shave face and neck.  Do not bring valuables to the hospital.  [facility] is not responsible   for any belongings or valuables.               Contacts, dentures or bridgework may not be worn into surgery.  Leave suitcase in the car. After surgery it may be brought to your room.  For patients admitted to the hospital, discharge time is determined by your  treatment team.               Patients discharged the day of surgery will not be allowed to drive home.  Please read over the following fact sheets that you were given: Pain Booklet, Coughing and Deep Breathing and Surgical Site Infection Prevention   - Preparing for Surgery  Before surgery, you can play an important role.  Because skin is not sterile, your skin needs to be as free of germs as possible.  You can reduce the number of germs on you skin by washing with CHG (chlorahexidine gluconate) soap before surgery.  CHG is an antiseptic cleaner which kills germs and bonds with the skin to continue killing germs even after washing.  Please DO NOT use if you have an allergy to CHG or antibacterial soaps.  If your skin becomes reddened/irritated stop using the CHG and inform your nurse when you arrive at Short Stay.  Do not shave (including legs and underarms) for at least 48 hours prior to the first CHG shower.  You may shave your face.  Please follow these instructions carefully:   1.  Shower with CHG Soap the night before  surgery and the morning of Surgery.  2.  If you choose to wash your hair, wash your hair first as usual with your normal shampoo.  3.  After you shampoo, rinse your hair and body thoroughly to remove the shampoo.  4.  Use CHG as you would any other liquid soap.  You can apply CHG directly to the skin and wash gently with scrungie or a clean washcloth.  5.  Apply the CHG Soap to your body ONLY FROM THE NECK DOWN.  Do not use on open wounds or open sores.  Avoid contact with your eyes, ears, mouth and genitals (private parts).  Wash genitals (private parts) with your normal soap.  6.  Wash thoroughly, paying special attention to the area where your surgery will be performed.  7.  Thoroughly rinse your body with warm water from the neck down.  8.  DO NOT shower/wash with your normal soap after using and rinsing off the CHG Soap.  9.  Pat yourself dry with a clean towel.            10.  Wear clean pajamas.            11.  Place clean sheets on your bed the night of your first shower and do  not sleep with pets.  Day of Surgery  Do not apply any lotions/deoderants the morning of surgery.  Please wear clean clothes to the hospital/surgery center.

## 2014-07-05 NOTE — Telephone Encounter (Signed)
Patient called stating she would like to cancel 07/09/14 appointment with Dr. Julien Nordmann. She states that Dr. Julien Nordmann would like to see her after biopsy and visit with Dr. Charlaine Dalton. Informed patient that information will be forwarded to Dr. Julien Nordmann and his nurse.

## 2014-07-05 NOTE — Progress Notes (Signed)
Anesthesia Chart Review: Patient is a 59 year old female scheduled for video bronchoscopy with endobronchial ultrasound on 2/-1/15 by Dr. Prescott Gum.  Procedure was initially done on 04/17/14 by Dr. Lamonte Sakai was ultimately diagnosed with stage III adenocarcinoma of the RLL with involvement of the subcarinal lymph node station. She is s/p chemoradiation. Dr. Prescott Gum felt she would be a candidate for resection but recommended proceeding with bronchoscopy/EBUS to document subcarinal LN station is free of tumor.    Other history includes former smoker (quit 03/17/13), migraines with visual disturbances, HTN, left renal congenital atrophy, vitamin D deficiency, COPD, exertional dyspnea, GERD, thyroid nodule (reported negative work-up and TSH), PVCs, varicose veins, tonsillectomy, post-operative N/V, post-operative bronchospasm following bronchoscopy 04/17/14. BMI 24. PCP is Dr. Melanie Crazier Shamleffer [phone]) with [facility]. HEM-ONC is Dr. Julien Nordmann. Pulmonologist is Dr. Lamonte Sakai. Cardiologist is Dr. Tamala Julian, seen in 04/2014 for frequent PVCs. He ordered an echo [see below] and wrote, "If no surprises on echocardiogram, and specific management of premature ventricular contractions will be undertaken." She is a retired Psychologist, forensic. Unfortunately, her husband is also being treated for colon cancer.  According to Dr. Lucianne Lei Trigt's 07/03/14 office note, "The patient states that when she had general anesthesia for bronchoscopy in November she had severe bronchospasm following procedure as well as prolonged sedation which required an overnight observation."  I reviewed notes and have not been able to find any mention of bronchospasm or an overnight stay.   Meds include albuterol, Flonase, Claritin, Prilosec, Compazine, Carafate, Restoril, Calan-SR.  04/25/14 EKG: NSR, non-specific ST abnormality.  EKG from 04/15/14 showed trigeminy PVCs and prolonged QT for which I did a phone consultation with cardiology.   Dr. Marlou Porch actually felt her QT interval measured 450 ms and not 748 ms.  (See my note.)  05/01/14 Echo: - Left ventricle: The cavity size was normal. Systolic function was normal. The estimated ejection fraction was in the range of 50% to 55%. Wall motion was normal; there were no regional wall motion abnormalities. Doppler parameters are consistent with abnormal left ventricular relaxation (grade 1 diastolic dysfunction). Global lateral strain: -18.5% Lateral S&' - 10.6cm/s - Mitral valve: Calcified annulus. There was mild regurgitation.  Nuclear stress test 07/03/10 [facility]): Normal myocardial perfusion study. Normal post-stress LVF, EF 57%. Exercise capacity of 7 METS. (ETT [exercise portion] 06/24/10: Multi-focal PVCs during exercise, and triplets noted; 34mm horizontal ST segment depression in inferior leads. She reported that this lead to her 07/03/10 nuclear study.)  Carotid duplex 12/22/11 [facility]): No significant carotid artery disease bilaterally.  07/05/14 CXR report is still pending.  Chest CT on 07/03/14: IMPRESSION: Interval decrease in size of the superior segment right lower lobe lung mass. The subcarinal adenopathy is also improved. Stable emphysematous changes. No findings for pulmonary metastatic disease.  Preoperative labs noted.  Anticipate that she can proceed as planned.  Her assigned anesthesiologist will discuss definitive anesthesia plan and be aware of her previous history of bronchospasm.  [facility] Short Stay Center/Anesthesiology Phone [phone] 07/05/2014 3:57 PM

## 2014-07-05 NOTE — Anesthesia Preprocedure Evaluation (Addendum)
Anesthesia Evaluation  Patient identified by MRN, date of birth, ID band Patient awake    Reviewed: Allergy & Precautions, NPO status , Patient's Chart, lab work & pertinent test results, reviewed documented beta blocker date and time   History of Anesthesia Complications (+) PONV and history of anesthetic complications  Airway Mallampati: II  TM Distance: <3 FB Neck ROM: Full    Dental  (+) Teeth Intact, Dental Advisory Given   Pulmonary shortness of breath and with exertion, COPDformer smoker (quit '14),  RLL lung cancer breath sounds clear to auscultation        Cardiovascular hypertension, Pt. on medications - anginaRhythm:Regular Rate:Normal  '15 ECHO: EF 73-40%, grade 1 diastolic dysfunction, valves ok   Neuro/Psych  Headaches, Anxiety    GI/Hepatic Neg liver ROS, GERD-  Medicated and Controlled,  Endo/Other  negative endocrine ROS  Renal/GU negative Renal ROS     Musculoskeletal  (+) Arthritis -,   Abdominal   Peds  Hematology negative hematology ROS (+)   Anesthesia Other Findings   Reproductive/Obstetrics                        Anesthesia Physical Anesthesia Plan  ASA: III  Anesthesia Plan: General   Post-op Pain Management:    Induction: Intravenous  Airway Management Planned: Oral ETT  Additional Equipment:   Intra-op Plan:   Post-operative Plan: Extubation in OR  Informed Consent: I have reviewed the patients History and Physical, chart, labs and discussed the procedure including the risks, benefits and alternatives for the proposed anesthesia with the patient or authorized representative who has indicated his/her understanding and acceptance.   Dental advisory given  Plan Discussed with: CRNA and Surgeon  Anesthesia Plan Comments: (Reported history of severe bronchospasm with 04/2014 bronchoscopy.  Myra Gianotti, PA-C Plan routine monitors, GETA with  pre-treatment albuterol and decadron)       Anesthesia Quick Evaluation

## 2014-07-08 ENCOUNTER — Encounter (HOSPITAL_COMMUNITY)
Admission: RE | Disposition: A | Discharge status: Home / Self Care | Payer: Self-pay | Source: Ambulatory Visit | Attending: Cardiothoracic Surgery

## 2014-07-08 ENCOUNTER — Ambulatory Visit (HOSPITAL_COMMUNITY): Payer: Managed Care, Other (non HMO) | Admitting: Vascular Surgery

## 2014-07-08 ENCOUNTER — Ambulatory Visit (HOSPITAL_COMMUNITY): Payer: Managed Care, Other (non HMO) | Admitting: Anesthesiology

## 2014-07-08 ENCOUNTER — Ambulatory Visit (HOSPITAL_COMMUNITY): Payer: Managed Care, Other (non HMO)

## 2014-07-08 ENCOUNTER — Ambulatory Visit (HOSPITAL_COMMUNITY)
Admission: RE | Admit: 2014-07-08 | Discharge: 2014-07-08 | Disposition: A | Discharge status: Home / Self Care | Payer: Managed Care, Other (non HMO) | Source: Ambulatory Visit | Attending: Cardiothoracic Surgery | Admitting: Cardiothoracic Surgery

## 2014-07-08 DIAGNOSIS — R599 Enlarged lymph nodes, unspecified: Secondary | ICD-10-CM

## 2014-07-08 DIAGNOSIS — M1990 Unspecified osteoarthritis, unspecified site: Secondary | ICD-10-CM | POA: Insufficient documentation

## 2014-07-08 DIAGNOSIS — C3431 Malignant neoplasm of lower lobe, right bronchus or lung: Secondary | ICD-10-CM | POA: Insufficient documentation

## 2014-07-08 DIAGNOSIS — R0602 Shortness of breath: Secondary | ICD-10-CM

## 2014-07-08 DIAGNOSIS — K219 Gastro-esophageal reflux disease without esophagitis: Secondary | ICD-10-CM | POA: Insufficient documentation

## 2014-07-08 DIAGNOSIS — J449 Chronic obstructive pulmonary disease, unspecified: Secondary | ICD-10-CM | POA: Insufficient documentation

## 2014-07-08 DIAGNOSIS — Z87891 Personal history of nicotine dependence: Secondary | ICD-10-CM | POA: Insufficient documentation

## 2014-07-08 DIAGNOSIS — I10 Essential (primary) hypertension: Secondary | ICD-10-CM | POA: Insufficient documentation

## 2014-07-08 DIAGNOSIS — F419 Anxiety disorder, unspecified: Secondary | ICD-10-CM | POA: Insufficient documentation

## 2014-07-08 DIAGNOSIS — R591 Generalized enlarged lymph nodes: Secondary | ICD-10-CM

## 2014-07-08 HISTORY — PX: VIDEO BRONCHOSCOPY WITH ENDOBRONCHIAL ULTRASOUND: SHX6177

## 2014-07-08 SURGERY — BRONCHOSCOPY, WITH EBUS
Anesthesia: General | Site: Chest

## 2014-07-08 MED ORDER — EPHEDRINE SULFATE 50 MG/ML IJ SOLN
INTRAMUSCULAR | Status: AC
  Filled 2014-07-08: qty 1

## 2014-07-08 MED ORDER — ALBUTEROL SULFATE HFA 108 (90 BASE) MCG/ACT IN AERS
INHALATION_SPRAY | RESPIRATORY_TRACT | Status: DC | PRN
  Administered 2014-07-08 (×2): 2 via RESPIRATORY_TRACT

## 2014-07-08 MED ORDER — SODIUM CHLORIDE 0.9 % IV SOLN: 250.0000 mL | INTRAVENOUS | Status: DC | PRN

## 2014-07-08 MED ORDER — ONDANSETRON HCL 4 MG/2ML IJ SOLN
INTRAMUSCULAR | Status: AC
Start: 2014-07-08 — End: 2014-07-08
  Filled 2014-07-08: qty 2

## 2014-07-08 MED ORDER — FENTANYL CITRATE 0.05 MG/ML IJ SOLN: 25.0000 ug | INTRAMUSCULAR | Status: DC | PRN

## 2014-07-08 MED ORDER — GLYCOPYRROLATE 0.2 MG/ML IJ SOLN
INTRAMUSCULAR | Status: AC
  Filled 2014-07-08: qty 3

## 2014-07-08 MED ORDER — CEFAZOLIN SODIUM-DEXTROSE 2-3 GM-% IV SOLR
INTRAVENOUS | Status: DC | PRN
  Administered 2014-07-08: 2 g via INTRAVENOUS

## 2014-07-08 MED ORDER — LACTATED RINGERS IV SOLN
INTRAVENOUS | Status: DC | PRN
  Administered 2014-07-08 (×2): via INTRAVENOUS

## 2014-07-08 MED ORDER — MIDAZOLAM HCL 2 MG/2ML IJ SOLN: 0.5000 mg | Freq: Once | INTRAMUSCULAR | Status: DC | PRN

## 2014-07-08 MED ORDER — ONDANSETRON HCL 4 MG/2ML IJ SOLN
INTRAMUSCULAR | Status: DC | PRN
  Administered 2014-07-08: 4 mg via INTRAVENOUS

## 2014-07-08 MED ORDER — DIPHENHYDRAMINE HCL 50 MG/ML IJ SOLN
INTRAMUSCULAR | Status: DC | PRN
  Administered 2014-07-08: 10 mg via INTRAVENOUS

## 2014-07-08 MED ORDER — PROPOFOL 10 MG/ML IV BOLUS
INTRAVENOUS | Status: AC
  Filled 2014-07-08: qty 20

## 2014-07-08 MED ORDER — LIDOCAINE HCL (CARDIAC) 20 MG/ML IV SOLN
INTRAVENOUS | Status: DC | PRN
  Administered 2014-07-08: 20 mg via INTRAVENOUS

## 2014-07-08 MED ORDER — CEFAZOLIN SODIUM-DEXTROSE 2-3 GM-% IV SOLR
INTRAVENOUS | Status: AC
  Filled 2014-07-08: qty 50

## 2014-07-08 MED ORDER — LIDOCAINE HCL (CARDIAC) 20 MG/ML IV SOLN
INTRAVENOUS | Status: AC
  Filled 2014-07-08: qty 5

## 2014-07-08 MED ORDER — SUCCINYLCHOLINE CHLORIDE 20 MG/ML IJ SOLN
INTRAMUSCULAR | Status: AC
  Filled 2014-07-08: qty 1

## 2014-07-08 MED ORDER — FENTANYL CITRATE 0.05 MG/ML IJ SOLN
INTRAMUSCULAR | Status: AC
  Filled 2014-07-08: qty 5

## 2014-07-08 MED ORDER — 0.9 % SODIUM CHLORIDE (POUR BTL) OPTIME
TOPICAL | Status: DC | PRN
  Administered 2014-07-08: 1000 mL

## 2014-07-08 MED ORDER — ROCURONIUM BROMIDE 100 MG/10ML IV SOLN
INTRAVENOUS | Status: DC | PRN
  Administered 2014-07-08: 40 mg via INTRAVENOUS

## 2014-07-08 MED ORDER — PROPOFOL 10 MG/ML IV BOLUS
INTRAVENOUS | Status: DC | PRN
Start: 2014-07-08 — End: 2014-07-08
  Administered 2014-07-08: 50 mg via INTRAVENOUS
  Administered 2014-07-08: 150 mg via INTRAVENOUS

## 2014-07-08 MED ORDER — ACETAMINOPHEN 325 MG PO TABS: 650.0000 mg | ORAL_TABLET | ORAL | Status: DC | PRN

## 2014-07-08 MED ORDER — MIDAZOLAM HCL 5 MG/5ML IJ SOLN
INTRAMUSCULAR | Status: DC | PRN
  Administered 2014-07-08 (×2): 1 mg via INTRAVENOUS

## 2014-07-08 MED ORDER — MEPERIDINE HCL 25 MG/ML IJ SOLN: 6.2500 mg | INTRAMUSCULAR | Status: DC | PRN

## 2014-07-08 MED ORDER — ALBUTEROL SULFATE HFA 108 (90 BASE) MCG/ACT IN AERS
INHALATION_SPRAY | RESPIRATORY_TRACT | Status: AC
  Filled 2014-07-08: qty 6.7

## 2014-07-08 MED ORDER — ACETAMINOPHEN 650 MG RE SUPP: 650.0000 mg | RECTAL | Status: DC | PRN

## 2014-07-08 MED ORDER — PHENYLEPHRINE 40 MCG/ML (10ML) SYRINGE FOR IV PUSH (FOR BLOOD PRESSURE SUPPORT)
PREFILLED_SYRINGE | INTRAVENOUS | Status: AC
Start: 2014-07-08 — End: 2014-07-08
  Filled 2014-07-08: qty 10

## 2014-07-08 MED ORDER — DEXAMETHASONE SODIUM PHOSPHATE 4 MG/ML IJ SOLN
INTRAMUSCULAR | Status: AC
  Filled 2014-07-08: qty 2

## 2014-07-08 MED ORDER — LIDOCAINE HCL 4 % MT SOLN
OROMUCOSAL | Status: DC | PRN
  Administered 2014-07-08: 4 mL via TOPICAL

## 2014-07-08 MED ORDER — SODIUM CHLORIDE 0.9 % IJ SOLN
INTRAMUSCULAR | Status: AC
  Filled 2014-07-08: qty 10

## 2014-07-08 MED ORDER — ALBUTEROL SULFATE (2.5 MG/3ML) 0.083% IN NEBU: 3.0000 mL | INHALATION_SOLUTION | Freq: Four times a day (QID) | RESPIRATORY_TRACT | Status: DC | PRN

## 2014-07-08 MED ORDER — EPINEPHRINE HCL 1 MG/ML IJ SOLN
INTRAMUSCULAR | Status: AC
  Filled 2014-07-08: qty 1

## 2014-07-08 MED ORDER — GLYCOPYRROLATE 0.2 MG/ML IJ SOLN
INTRAMUSCULAR | Status: DC | PRN
  Administered 2014-07-08: .8 mg via INTRAVENOUS

## 2014-07-08 MED ORDER — SODIUM CHLORIDE 0.9 % IJ SOLN: 3.0000 mL | Freq: Two times a day (BID) | INTRAMUSCULAR | Status: DC

## 2014-07-08 MED ORDER — ROCURONIUM BROMIDE 50 MG/5ML IV SOLN
INTRAVENOUS | Status: AC
Start: 2014-07-08 — End: 2014-07-08
  Filled 2014-07-08: qty 1

## 2014-07-08 MED ORDER — PROPOFOL 10 MG/ML IV BOLUS
INTRAVENOUS | Status: AC
Start: 2014-07-08 — End: 2014-07-08
  Filled 2014-07-08: qty 20

## 2014-07-08 MED ORDER — ARTIFICIAL TEARS OP OINT
TOPICAL_OINTMENT | OPHTHALMIC | Status: DC | PRN
  Administered 2014-07-08: 1 via OPHTHALMIC

## 2014-07-08 MED ORDER — NEOSTIGMINE METHYLSULFATE 10 MG/10ML IV SOLN
INTRAVENOUS | Status: DC | PRN
  Administered 2014-07-08: 5 mg via INTRAVENOUS

## 2014-07-08 MED ORDER — MIDAZOLAM HCL 2 MG/2ML IJ SOLN
INTRAMUSCULAR | Status: AC
  Filled 2014-07-08: qty 2

## 2014-07-08 MED ORDER — ARTIFICIAL TEARS OP OINT
TOPICAL_OINTMENT | OPHTHALMIC | Status: AC
  Filled 2014-07-08: qty 3.5

## 2014-07-08 MED ORDER — DIPHENHYDRAMINE HCL 50 MG/ML IJ SOLN
INTRAMUSCULAR | Status: AC
  Filled 2014-07-08: qty 1

## 2014-07-08 MED ORDER — PROMETHAZINE HCL 25 MG/ML IJ SOLN: 6.2500 mg | INTRAMUSCULAR | Status: DC | PRN

## 2014-07-08 MED ORDER — SODIUM CHLORIDE 0.9 % IJ SOLN: 3.0000 mL | INTRAMUSCULAR | Status: DC | PRN

## 2014-07-08 MED ORDER — DEXAMETHASONE SODIUM PHOSPHATE 4 MG/ML IJ SOLN
INTRAMUSCULAR | Status: DC | PRN
  Administered 2014-07-08: 8 mg via INTRAVENOUS

## 2014-07-08 MED ORDER — GLYCOPYRROLATE 0.2 MG/ML IJ SOLN
INTRAMUSCULAR | Status: AC
  Filled 2014-07-08: qty 1

## 2014-07-08 MED ORDER — FENTANYL CITRATE 0.05 MG/ML IJ SOLN
INTRAMUSCULAR | Status: DC | PRN
  Administered 2014-07-08 (×3): 50 ug via INTRAVENOUS

## 2014-07-08 MED ORDER — PHENYLEPHRINE HCL 10 MG/ML IJ SOLN
INTRAMUSCULAR | Status: DC | PRN
  Administered 2014-07-08 (×2): 40 ug via INTRAVENOUS
  Administered 2014-07-08: 80 ug via INTRAVENOUS
  Administered 2014-07-08: 40 ug via INTRAVENOUS

## 2014-07-08 SURGICAL SUPPLY — 35 items
BALL CTTN LRG ABS STRL LF (GAUZE/BANDAGES/DRESSINGS)
BRUSH CYTOL CELLEBRITY 1.5X140 (MISCELLANEOUS) IMPLANT
CANISTER SUCTION 2500CC (MISCELLANEOUS) ×3 IMPLANT
CONT SPEC 4OZ CLIKSEAL STRL BL (MISCELLANEOUS) ×3 IMPLANT
COTTONBALL LRG STERILE PKG (GAUZE/BANDAGES/DRESSINGS) IMPLANT
COVER TABLE BACK 60X90 (DRAPES) ×3 IMPLANT
DRSG AQUACEL AG ADV 3.5X14 (GAUZE/BANDAGES/DRESSINGS) ×1 IMPLANT
FORCEPS BIOP RJ4 1.8 (CUTTING FORCEPS) IMPLANT
GAUZE SPONGE 4X4 12PLY STRL (GAUZE/BANDAGES/DRESSINGS) ×2 IMPLANT
GLOVE BIO SURGEON STRL SZ 6.5 (GLOVE) ×1 IMPLANT
GLOVE BIO SURGEON STRL SZ7.5 (GLOVE) ×3 IMPLANT
GLOVE BIO SURGEONS STRL SZ 6.5 (GLOVE) ×1
GOWN STRL REUS W/ TWL LRG LVL3 (GOWN DISPOSABLE) IMPLANT
GOWN STRL REUS W/TWL LRG LVL3 (GOWN DISPOSABLE) ×6
KIT CLEAN ENDO COMPLIANCE (KITS) ×3 IMPLANT
KIT ROOM TURNOVER OR (KITS) ×3 IMPLANT
MARKER SKIN DUAL TIP RULER LAB (MISCELLANEOUS) ×4 IMPLANT
NDL BIOPSY TRANSBRONCH 21G (NEEDLE) IMPLANT
NDL BLUNT 18X1 FOR OR ONLY (NEEDLE) IMPLANT
NEEDLE 22X1 1/2 (OR ONLY) (NEEDLE) IMPLANT
NEEDLE BIOPSY TRANSBRONCH 21G (NEEDLE) IMPLANT
NEEDLE BLUNT 18X1 FOR OR ONLY (NEEDLE) IMPLANT
NEEDLE SYS SONOTIP II EBUSTBNA (NEEDLE) ×3 IMPLANT
NS IRRIG 1000ML POUR BTL (IV SOLUTION) ×3 IMPLANT
OIL SILICONE PENTAX (PARTS (SERVICE/REPAIRS)) ×3 IMPLANT
PAD ARMBOARD 7.5X6 YLW CONV (MISCELLANEOUS) ×6 IMPLANT
SOLUTION ANTI FOG 6CC (MISCELLANEOUS) ×2 IMPLANT
SYR 20CC LL (SYRINGE) ×3 IMPLANT
SYR 20ML ECCENTRIC (SYRINGE) ×3 IMPLANT
SYR 5ML LUER SLIP (SYRINGE) ×3 IMPLANT
SYR CONTROL 10ML LL (SYRINGE) IMPLANT
TOWEL OR 17X24 6PK STRL BLUE (TOWEL DISPOSABLE) ×3 IMPLANT
TRAP SPECIMEN MUCOUS 40CC (MISCELLANEOUS) ×3 IMPLANT
TUBE CONNECTING 20'X1/4 (TUBING) ×1
TUBE CONNECTING 20X1/4 (TUBING) ×2 IMPLANT

## 2014-07-08 NOTE — Transfer of Care (Signed)
Immediate Anesthesia Transfer of Care Note  Patient: [name] [name]  Procedure(s) Performed: Procedure(s): VIDEO BRONCHOSCOPY WITH ENDOBRONCHIAL ULTRASOUND (N/A)  Patient Location: PACU  Anesthesia Type:General  Level of Consciousness: awake, alert  and oriented  Airway & Oxygen Therapy: Patient Spontanous Breathing and Patient connected to nasal cannula oxygen  Post-op Assessment: Report given to RN and Post -op Vital signs reviewed and stable  Post vital signs: Reviewed and stable  Last Vitals:  Filed Vitals:   07/08/14 0851  BP: 107/60  Pulse: 78  Temp:   Resp: 22    Complications: No apparent anesthesia complications

## 2014-07-08 NOTE — Discharge Instructions (Signed)

## 2014-07-08 NOTE — Brief Op Note (Signed)
07/08/2014  9:03 AM  PATIENT:  [name] [name]  59 y.o. female  PRE-OPERATIVE DIAGNOSIS:  ADENOPATHY  POST-OPERATIVE DIAGNOSIS:  ADENOPATHY  PROCEDURE:  Procedure(s): VIDEO BRONCHOSCOPY WITH ENDOBRONCHIAL ULTRASOUND (N/A)  SURGEON:  Surgeon(s) and Role:    * Ivin Poot, MD - Primary  PHYSICIAN ASSISTANT:   ASSISTANTS: none   ANESTHESIA:   general  EBL:  Total I/O In: 1000 [I.V.:1000] Out: -   BLOOD ADMINISTERED:none  DRAINS: none   LOCAL MEDICATIONS USED:  NONE  SPECIMEN:  Aspirate level 7 mediastinal  node bx   DISPOSITION OF SPECIMEN:  PATHOLOGY  COUNTS:  YES  TOURNIQUET:  * No tourniquets in log *  DICTATION: .Dragon Dictation  PLAN OF CARE: Discharge to home after PACU  PATIENT DISPOSITION:  PACU - hemodynamically stable.   Delay start of Pharmacological VTE agent (>24hrs) due to surgical blood loss or risk of bleeding: yes

## 2014-07-08 NOTE — Progress Notes (Signed)
The patient was examined and preop studies reviewed. There has been no change from the prior exam and the patient is ready for surgery.   Plan Bronch and EBUS on E Boehlerm today

## 2014-07-08 NOTE — Anesthesia Postprocedure Evaluation (Signed)
  Anesthesia Post-op Note  Patient: [name] [name]  Procedure(s) Performed: Procedure(s): VIDEO BRONCHOSCOPY WITH ENDOBRONCHIAL ULTRASOUND (N/A)  Patient Location: PACU  Anesthesia Type:General  Level of Consciousness: awake, alert , oriented and patient cooperative  Airway and Oxygen Therapy: Patient Spontanous Breathing and Patient connected to nasal cannula oxygen  Post-op Pain: none  Post-op Assessment: Post-op Vital signs reviewed, Patient's Cardiovascular Status Stable, Respiratory Function Stable, Patent Airway, No signs of Nausea or vomiting and Pain level controlled  Post-op Vital Signs: Reviewed and stable  Last Vitals:  Filed Vitals:   07/08/14 0851  BP: 107/60  Pulse: 78  Temp:   Resp: 22    Complications: No apparent anesthesia complications

## 2014-07-08 NOTE — Anesthesia Procedure Notes (Signed)
Procedure Name: Intubation Date/Time: 07/08/2014 7:41 AM Performed by: Susa Loffler Pre-anesthesia Checklist: Patient identified, Timeout performed, Emergency Drugs available, Suction available and Patient being monitored Patient Re-evaluated:Patient Re-evaluated prior to inductionOxygen Delivery Method: Circle system utilized Preoxygenation: Pre-oxygenation with 100% oxygen Intubation Type: IV induction Ventilation: Mask ventilation without difficulty Laryngoscope Size: Mac and 3 Grade View: Grade II Tube type: Oral Tube size: 8.0 mm Airway Equipment and Method: Stylet and LTA kit utilized Placement Confirmation: ETT inserted through vocal cords under direct vision,  positive ETCO2 and breath sounds checked- equal and bilateral Secured at: 22 cm Tube secured with: Tape Dental Injury: Teeth and Oropharynx as per pre-operative assessment

## 2014-07-09 ENCOUNTER — Other Ambulatory Visit: Payer: Managed Care, Other (non HMO)

## 2014-07-09 ENCOUNTER — Encounter (HOSPITAL_COMMUNITY): Payer: Self-pay | Admitting: Cardiothoracic Surgery

## 2014-07-09 ENCOUNTER — Telehealth: Payer: Self-pay | Admitting: Internal Medicine

## 2014-07-09 ENCOUNTER — Ambulatory Visit: Payer: Managed Care, Other (non HMO) | Admitting: Internal Medicine

## 2014-07-09 NOTE — Op Note (Signed)
NAMEJANYLA, [name] [name] NO.:  [id number]  MEDICAL RECORD NO.:  [id number]  LOCATION:  [facility]                         FACILITY:  [facility]  PHYSICIAN:  Ivin Poot, M.D.  DATE OF BIRTH:  04/13/1956  DATE OF PROCEDURE:  07/08/2014 DATE OF DISCHARGE:  07/08/2014                              OPERATIVE REPORT   OPERATION:  Video bronchoscopy with endobronchial ultrasound-directed mediastinal lymph node biopsy (EBUS).  SURGEON:  Ivin Poot, M.D.  PREOPERATIVE DIAGNOSIS:  Treated stage IIIA non-small cell carcinoma of the right lower lobe.  POSTOPERATIVE DIAGNOSIS:  Treated stage IIIA non-small cell carcinoma of the right lower lobe.  CLINICAL NOTE:  The patient is a 59 year old female who was diagnosed with adenocarcinoma of the right lower lobe in late 2015.  Clinical and pathologic staging was IIIA with positive subcarinal node on EBUS biopsy.  The patient subsequently underwent chemotherapy and radiation with 45 Gy to the right lower lobe and right hilum and mediastinum.  One month after finishing her last radiation, she had a repeat CT scan which showed reduced size of both the primary tumor and a subcarinal lymph node station.  She presents today for a repeat EBUS to determine if she would be down staged to a lesser stage of her adenocarcinoma of the right lower lobe.  DESCRIPTION OF PROCEDURE:  The patient was brought to the operating room and placed supine on the operating room table, general anesthesia was induced.  A proper time-out was performed.  Through the endotracheal tube, a video bronchoscope was passed.  The distal carina and left mainstem bronchus were normal.  The left upper lobe and left lower lobe endobronchial segments were visualized and had no endobronchial pathology.  The bronchoscope was passed on the right mainstem bronchus. There was some mild erythema and edema.  The bronchoscope was passed to right upper lobe which showed no  endobronchial lesions.  The right middle lobe showed no endobronchial lesions.  The right lower lobe bronchus was very edematous and thickened and bled easily.  There were no discrete endobronchial lesions.  Washings were taken.  The bronchoscope was withdrawn.  The EBUS scope was then inserted.  The subcarinal lymph node station medial to the right mainstem bronchus was visualized.  Several transbronchial biopsies and aspirates were taken of this level 7 lymph node station.  These were sent for both preliminary and final cytopathology.  There was some minimal bleeding.  The EBUS scope was withdrawn.  The video scope was reinserted and the airway was irrigated and cleared of any bloody secretions.  The bronchoscope was withdrawn.  The patient was extubated and returned to the recovery room in stable condition.     Ivin Poot, M.D.     PV/MEDQ  D:  07/08/2014  T:  07/09/2014  Job:  [id number]

## 2014-07-09 NOTE — Telephone Encounter (Addendum)
Per MD staff msg ok to schedule pt labs/ov on 02/23, lft msg for pt and mailed sch to pt

## 2014-07-09 NOTE — Telephone Encounter (Signed)
returned pt call re cancelling 2/2 appt. s/w pt and confirmed cancellation and r/s appt for 2/23. per pt due to appt w/TCTS and pending surgery she may need to r/s but will let us know.

## 2014-07-10 ENCOUNTER — Encounter: Payer: Self-pay | Admitting: Cardiothoracic Surgery

## 2014-07-10 ENCOUNTER — Ambulatory Visit (INDEPENDENT_AMBULATORY_CARE_PROVIDER_SITE_OTHER): Payer: Self-pay | Admitting: Cardiothoracic Surgery

## 2014-07-10 ENCOUNTER — Other Ambulatory Visit: Payer: Self-pay | Admitting: *Deleted

## 2014-07-10 VITALS — BP 140/88 | HR 79 | Resp 20 | Ht 66.0 in | Wt 150.0 lb

## 2014-07-10 DIAGNOSIS — Z9889 Other specified postprocedural states: Secondary | ICD-10-CM

## 2014-07-10 DIAGNOSIS — C3431 Malignant neoplasm of lower lobe, right bronchus or lung: Secondary | ICD-10-CM

## 2014-07-10 DIAGNOSIS — C3491 Malignant neoplasm of unspecified part of right bronchus or lung: Secondary | ICD-10-CM

## 2014-07-10 NOTE — Progress Notes (Signed)
PCP is Cloyd Stagers, MD Referring Provider is Lora Paula, MD  Chief Complaint  Patient presents with  . 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 Restaging CT scan after 32 GY radiation and chemotherapy therapy showed reduction in size of both primary tumor and subcarinal lymph node adenopathy. Patient referred back by her oncologist for assessment for resection. The patient underwent endobronchial ultrasound biopsy of the subcarinal lymph node which was negative for malignancy. Endobronchial anatomy appeared adequate for right lower lobectomy with lymph node dissection. The patient tolerated her chemoradiation fairly well and is highly functional. She has stopped smoking.her weight has been stable and her strength is good.  Review of her point function tests prior to surgery show  adequate mechanics and adequate diffusion capacity 4 right lower lobectomy and lymph node dissection.  The patient has well-controlled hypertension.echocardiogram late 2015 showed EF 55% without significant valvular disease.   Past Medical History  Diagnosis Date  . Visual disturbance     Episodic  . History of migraine   . Hypertension   . Thyroid nodule     benign by biopsy  . Varicose veins     left leg  . Vitamin D deficiency   . Congenital renal atrophy     left kidney  . Migraine equivalent 11/09/2012  . Degenerative arthritis   . Shortness of breath dyspnea     with exertion  . Wears glasses   . COPD (chronic obstructive pulmonary disease)   . GERD (gastroesophageal reflux disease)   . Complication of anesthesia     bronchospasms post-op (pulmonary did consult but pt was discharged later that day)  . PONV (postoperative nausea and vomiting)   . Cancer     lung  . PVC (premature ventricular contraction)   . Anxiety     husband  also cancer pt    Past Surgical History  Procedure Laterality Date  . Tonsillectomy    . Cesarean section    . Carpal tunnel release Left   . Colonoscopy w/ biopsies and polypectomy    . Dilation and curettage of uterus    . Video bronchoscopy with endobronchial ultrasound Right 04/17/2014    Procedure: VIDEO BRONCHOSCOPY WITH ENDOBRONCHIAL ULTRASOUND;  Surgeon: Collene Gobble, MD;  Location: [facility];  Service: Thoracic;  Laterality: Right;  . Varicose vein surgery      left  . Video bronchoscopy with endobronchial ultrasound N/A 07/08/2014    Procedure: VIDEO BRONCHOSCOPY WITH ENDOBRONCHIAL ULTRASOUND;  Surgeon: Ivin Poot, MD;  Location: [facility] OR;  Service: Thoracic;  Laterality: N/A;    Family History  Problem Relation Age of Onset  . Cancer Mother     cancer of the small intestines  . Hypertension Father   . Atrial fibrillation Father   . Diverticulitis Father   . Hypertension Sister   . Diverticulitis Brother   . Hypertension Sister   . Headache Sister     Social History History  Substance Use Topics  . Smoking status: Former Smoker -- 0.50 packs/day for 40 years    Types: Cigarettes    Quit date: 03/17/2013  . Smokeless tobacco: Never Used  . Alcohol Use: 0.0 oz/week    0 Not specified per week     Comment: " rare"    Current Outpatient Prescriptions  Medication  Sig Dispense Refill  . omeprazole (PRILOSEC) 20 MG capsule Take 1 capsule (20 mg total) by mouth daily. 60 capsule 3  . verapamil (CALAN-SR) 180 MG CR tablet Take 180 mg by mouth at bedtime.    . Vitamin D, Ergocalciferol, (DRISDOL) 50000 UNITS CAPS capsule Take 50,000 Units by mouth every 7 (seven) days. Mondays  1   No current facility-administered medications for this visit.    Allergies  Allergen Reactions  . Advil [Ibuprofen] Other (See Comments)    Causes hematuria advil syrup causes itching  . Hydrocodone-Guaifenesin Itching  . Sulfa Antibiotics Hives and Swelling  . Temazepam Other (See  Comments)    dizziness  . Plasticized Base [Plastibase] Rash    tape  . Zofran [Ondansetron Hcl] Palpitations    Review of Systems    General:   Normal appetite, no weight loss, no fever or night sweats Cardiac:     -  Chest pain with exertion, -  chest pain at rest,   SOB with exertion,-                  -  PND,  - orthopnea, -  palpitations or arrhythmias,     -history atrial fibrillation                  -dizzy spells-presyncope,  - Syncope,-  LE edema Respiratory:  No Shortness of breath,  no home oxygen, no productive cough, no                  sleep apnea, no CPAP at night, no hemoptysis, no COPD GI:           The patient admits to difficulty swallowing pills , barium swallow study performed late last year shows no stricture but probable decreased peristalsis.  no reflux, no hiatal hernia-heartburn, no chronic                          abdominal pain, no hematochezia, no hematemesis, no melena GU:        No dysuria, no frequency, no UTI recently, no hematuria, no kidney stones,               No BPH Vascular: No pain suggestive of claudication, no varicose veins, no DVT, no nonhealing                Foot ulcer, no rest pain suggestive of ischemia Neuro:   No stroke, no TIAs, no seizures, no neuropathy, no gait instability, no  memory/cognitive dysfunction                Musculoskeletal:  No arthritis, no joint swelling, no difficulty walking, no decreased                        Mobility Skin:     No rash, no ulcerations or pressure sores Psych:    No anxiety, no depression, Eyes:    No change in vision, no amaurosis, no eye surgery ENT:    No hearing loss, no loose or painful teeth, no dentures, no recent dental                          procedure Hematologic:  No easy bruising, no bleeding disorder, no frequent epistaxes Endocrine:  No diabetes, -  checks CBG at home    BP 140/88 mmHg  Pulse 79  Resp 20  Ht 5\' 6"  (1.676 m)  Wt 150 lb (68.04 kg)  BMI 24.22 kg/m2  SpO2 97%    Physical Exam  General: Middle-aged Caucasian female no acute distress HEENT: Normocephalic pupils equal , dentition adequate Neck: Supple without JVD, adenopathy, or bruit Chest: Clear to auscultation, symmetrical breath sounds, no rhonchi, no tenderness             or deformity Cardiovascular: Regular rate and rhythm, no murmur, no gallop, peripheral pulses             palpable in all extremities Abdomen:  Soft, nontender, no palpable mass or organomegaly Extremities: Warm, well-perfused, no clubbing cyanosis edema or tenderness,              no venous stasis changes of the legs Rectal/GU: Deferred Neuro: Grossly non--focal and symmetrical throughout Skin: Clean and dry without rash or ulceration   Diagnostic Tests: Cytology report from transbronchial biopsy of subcarinal lymph node reviewed with patient and husband. No Malignant cells Noted. Results of recent chest CT scan again reviewed with patient and family as well as point function testing.   Impression: Patient appears to be downstaged from initial stage III adenocarcinoma right lower lobe and appears to be candidate for pulmonary section. The patient wishes to proceed with surgery to obtain best chance for cure. We discussed  the procedure of right lower lobectomy with lymph node dissection including the location of the surgical incisions, the use of postoperative chest tube drainage, expected postop recovery, and the potential risks of bleeding, blood transfusion, infection, prolonged ventilator dependence, prolonged air leak, postoperative  pleural effusion,and death.  Plan:patient agrees to proceed with surgery which will scheduled for February 16 at [facility] --  By mouth bronchoscopy with right VATS, right lower lobectomy and lymph node dissection.

## 2014-07-16 ENCOUNTER — Telehealth: Payer: Self-pay | Admitting: *Deleted

## 2014-07-16 NOTE — Telephone Encounter (Signed)
Pt wanted to know if it was okay to have her hair colored this Thursday.  Ok per Dr Vista Mink.  She also wanted to know when she should have her f/u.  Per Dr Vista Mink will cancel 2/23 lab and f/u and r/s after she has her surgery which is scheduled 2/16.

## 2014-07-19 ENCOUNTER — Encounter (HOSPITAL_COMMUNITY)
Admission: RE | Admit: 2014-07-19 | Discharge: 2014-07-19 | Disposition: A | Discharge status: Home / Self Care | Payer: Managed Care, Other (non HMO) | Source: Ambulatory Visit | Attending: Cardiothoracic Surgery | Admitting: Cardiothoracic Surgery

## 2014-07-19 ENCOUNTER — Other Ambulatory Visit: Payer: Self-pay

## 2014-07-19 ENCOUNTER — Encounter (HOSPITAL_COMMUNITY): Payer: Self-pay

## 2014-07-19 VITALS — BP 130/71 | HR 90 | Temp 98.0°F | Resp 20 | Ht 65.75 in | Wt 152.4 lb

## 2014-07-19 DIAGNOSIS — Z01810 Encounter for preprocedural cardiovascular examination: Secondary | ICD-10-CM | POA: Insufficient documentation

## 2014-07-19 DIAGNOSIS — Z01818 Encounter for other preprocedural examination: Secondary | ICD-10-CM | POA: Insufficient documentation

## 2014-07-19 DIAGNOSIS — Z0183 Encounter for blood typing: Secondary | ICD-10-CM | POA: Diagnosis not present

## 2014-07-19 DIAGNOSIS — Z01812 Encounter for preprocedural laboratory examination: Secondary | ICD-10-CM | POA: Diagnosis not present

## 2014-07-19 DIAGNOSIS — Z87891 Personal history of nicotine dependence: Secondary | ICD-10-CM | POA: Diagnosis not present

## 2014-07-19 DIAGNOSIS — J449 Chronic obstructive pulmonary disease, unspecified: Secondary | ICD-10-CM | POA: Diagnosis not present

## 2014-07-19 DIAGNOSIS — C3491 Malignant neoplasm of unspecified part of right bronchus or lung: Secondary | ICD-10-CM | POA: Insufficient documentation

## 2014-07-19 LAB — CBC
HCT: 39.8 % (ref 36.0–46.0)
Hemoglobin: 13.1 g/dL (ref 12.0–15.0)
MCH: 30.6 pg (ref 26.0–34.0)
MCHC: 32.9 g/dL (ref 30.0–36.0)
MCV: 93 fL (ref 78.0–100.0)
Platelets: 242 10*3/uL (ref 150–400)
RBC: 4.28 MIL/uL (ref 3.87–5.11)
RDW: 14.6 % (ref 11.5–15.5)
WBC: 6.1 10*3/uL (ref 4.0–10.5)

## 2014-07-19 LAB — BLOOD GAS, ARTERIAL
Acid-base deficit: 1.5 mmol/L (ref 0.0–2.0)
Bicarbonate: 21.8 mEq/L (ref 20.0–24.0)
Drawn by: 428831
FIO2: 0.21 %
O2 Saturation: 99.7 %
Patient temperature: 98.6
TCO2: 22.7 mmol/L (ref 0–100)
pCO2 arterial: 31.2 mmHg — ABNORMAL LOW (ref 35.0–45.0)
pH, Arterial: 7.458 — ABNORMAL HIGH (ref 7.350–7.450)
pO2, Arterial: 174 mmHg — ABNORMAL HIGH (ref 80.0–100.0)

## 2014-07-19 LAB — PROTIME-INR
INR: 1.07 (ref 0.00–1.49)
Prothrombin Time: 14 seconds (ref 11.6–15.2)

## 2014-07-19 LAB — URINALYSIS, ROUTINE W REFLEX MICROSCOPIC
Bilirubin Urine: NEGATIVE
Glucose, UA: NEGATIVE mg/dL
Hgb urine dipstick: NEGATIVE
Ketones, ur: NEGATIVE mg/dL
Leukocytes, UA: NEGATIVE
Nitrite: NEGATIVE
Protein, ur: NEGATIVE mg/dL
Specific Gravity, Urine: 1.017 (ref 1.005–1.030)
Urobilinogen, UA: 1 mg/dL (ref 0.0–1.0)
pH: 7.5 (ref 5.0–8.0)

## 2014-07-19 LAB — SURGICAL PCR SCREEN
MRSA, PCR: NEGATIVE
Staphylococcus aureus: NEGATIVE

## 2014-07-19 LAB — COMPREHENSIVE METABOLIC PANEL
ALT: 18 U/L (ref 0–35)
AST: 19 U/L (ref 0–37)
Albumin: 3.8 g/dL (ref 3.5–5.2)
Alkaline Phosphatase: 105 U/L (ref 39–117)
Anion gap: 9 (ref 5–15)
BUN: 9 mg/dL (ref 6–23)
CO2: 21 mmol/L (ref 19–32)
Calcium: 9.4 mg/dL (ref 8.4–10.5)
Chloride: 108 mmol/L (ref 96–112)
Creatinine, Ser: 0.55 mg/dL (ref 0.50–1.10)
GFR calc Af Amer: >90 mL/min (ref >90)
GFR calc non Af Amer: >90 mL/min (ref >90)
Glucose, Bld: 103 mg/dL — ABNORMAL HIGH (ref 70–99)
Potassium: 3.8 mmol/L (ref 3.5–5.1)
Sodium: 138 mmol/L (ref 135–145)
Total Bilirubin: 0.7 mg/dL (ref 0.3–1.2)
Total Protein: 6.9 g/dL (ref 6.0–8.3)

## 2014-07-19 LAB — ABO/RH: ABO/RH(D): A POS

## 2014-07-19 LAB — APTT: aPTT: 31 seconds (ref 24–37)

## 2014-07-19 NOTE — Pre-Procedure Instructions (Signed)
[name] [name]  07/19/2014   Your procedure is scheduled on:  07/23/2014  Report to [facility] Admitting   ENTRANCE A at 5:30 AM.  Call this number if you have problems the morning of surgery: [phone]   Remember:   Do not eat food or drink liquids after midnight. On MONDAY   Take these medicines the morning of surgery with A SIP OF WATER: use prilosec if desired    Do not wear jewelry, make-up or nail polish.  Do not wear lotions, powders, or perfumes. You may wear deodorant.  Do not shave 48 hours prior to surgery.   Do not bring valuables to the hospital.  [facility] is not responsible                  for any belongings or valuables.               Contacts, dentures or bridgework may not be worn into surgery.  Leave suitcase in the car. After surgery it may be brought to your room.  For patients admitted to the hospital, discharge time is determined by your                treatment team.               Patients discharged the day of surgery will not be allowed to drive  home.  Name and phone number of your driver: with family  Special Instructions: Special Instructions: [place] - Preparing for Surgery  Before surgery, you can play an important role.  Because skin is not sterile, your skin needs to be as free of germs as possible.  You can reduce the number of germs on you skin by washing with CHG (chlorahexidine gluconate) soap before surgery.  CHG is an antiseptic cleaner which kills germs and bonds with the skin to continue killing germs even after washing.  Please DO NOT use if you have an allergy to CHG or antibacterial soaps.  If your skin becomes reddened/irritated stop using the CHG and inform your nurse when you arrive at Short Stay.  Do not shave (including legs and underarms) for at least 48 hours prior to the first CHG shower.  You may shave your face.  Please follow these instructions carefully:   1.  Shower with CHG Soap the night before surgery  and the  morning of Surgery.  2.  If you choose to wash your hair, wash your hair first as usual with your  normal shampoo.  3.  After you shampoo, rinse your hair and body thoroughly to remove the  Shampoo.  4.  Use CHG as you would any other liquid soap.  You can apply chg directly to the skin and wash gently with scrungie or a clean washcloth.  5.  Apply the CHG Soap to your body ONLY FROM THE NECK DOWN.    Do not use on open wounds or open sores.  Avoid contact with your eyes, ears, mouth and genitals (private parts).  Wash genitals (private parts)   with your normal soap.  6.  Wash thoroughly, paying special attention to the area where your surgery will be performed.  7.  Thoroughly rinse your body with warm water from the neck down.  8.  DO NOT shower/wash with your normal soap after using and rinsing off   the CHG Soap.  9.  Pat yourself dry with a clean towel.  10.  Wear clean pajamas.            11.  Place clean sheets on your bed the night of your first shower and do not sleep with pets.  Day of Surgery  Do not apply any lotions/deodorants the morning of surgery.  Please wear clean clothes to the hospital/surgery center.   Please read over the following fact sheets that you were given: Pain Booklet, Coughing and Deep Breathing, Blood Transfusion Information, MRSA Information and Surgical Site Infection Prevention

## 2014-07-22 MED ORDER — DEXTROSE 5 % IV SOLN
1.5000 g | INTRAVENOUS | Status: AC
  Administered 2014-07-23: 1.5 g via INTRAVENOUS
  Filled 2014-07-22: qty 1.5

## 2014-07-23 ENCOUNTER — Encounter (HOSPITAL_COMMUNITY)
Admission: RE | Disposition: A | Discharge status: Home / Self Care | Payer: Self-pay | Source: Ambulatory Visit | Attending: Cardiothoracic Surgery

## 2014-07-23 ENCOUNTER — Inpatient Hospital Stay (HOSPITAL_COMMUNITY): Payer: Managed Care, Other (non HMO)

## 2014-07-23 ENCOUNTER — Inpatient Hospital Stay (HOSPITAL_COMMUNITY)
Admission: RE | Admit: 2014-07-23 | Discharge: 2014-07-28 | DRG: 164 | Disposition: A | Discharge status: Home / Self Care | Payer: Managed Care, Other (non HMO) | Source: Ambulatory Visit | Attending: Cardiothoracic Surgery | Admitting: Cardiothoracic Surgery

## 2014-07-23 ENCOUNTER — Inpatient Hospital Stay (HOSPITAL_COMMUNITY): Payer: Managed Care, Other (non HMO) | Admitting: Certified Registered"

## 2014-07-23 ENCOUNTER — Encounter (HOSPITAL_COMMUNITY): Payer: Self-pay | Admitting: *Deleted

## 2014-07-23 DIAGNOSIS — E860 Dehydration: Secondary | ICD-10-CM | POA: Diagnosis not present

## 2014-07-23 DIAGNOSIS — R300 Dysuria: Secondary | ICD-10-CM | POA: Diagnosis not present

## 2014-07-23 DIAGNOSIS — Z9689 Presence of other specified functional implants: Secondary | ICD-10-CM

## 2014-07-23 DIAGNOSIS — C3431 Malignant neoplasm of lower lobe, right bronchus or lung: Secondary | ICD-10-CM

## 2014-07-23 DIAGNOSIS — Z87891 Personal history of nicotine dependence: Secondary | ICD-10-CM | POA: Diagnosis not present

## 2014-07-23 DIAGNOSIS — C3491 Malignant neoplasm of unspecified part of right bronchus or lung: Secondary | ICD-10-CM

## 2014-07-23 DIAGNOSIS — J449 Chronic obstructive pulmonary disease, unspecified: Secondary | ICD-10-CM | POA: Diagnosis present

## 2014-07-23 DIAGNOSIS — Z9221 Personal history of antineoplastic chemotherapy: Secondary | ICD-10-CM | POA: Diagnosis not present

## 2014-07-23 DIAGNOSIS — I10 Essential (primary) hypertension: Secondary | ICD-10-CM | POA: Diagnosis present

## 2014-07-23 DIAGNOSIS — K219 Gastro-esophageal reflux disease without esophagitis: Secondary | ICD-10-CM | POA: Diagnosis present

## 2014-07-23 DIAGNOSIS — Q603 Renal hypoplasia, unilateral: Secondary | ICD-10-CM | POA: Diagnosis not present

## 2014-07-23 DIAGNOSIS — C3490 Malignant neoplasm of unspecified part of unspecified bronchus or lung: Secondary | ICD-10-CM

## 2014-07-23 DIAGNOSIS — Z923 Personal history of irradiation: Secondary | ICD-10-CM | POA: Diagnosis not present

## 2014-07-23 DIAGNOSIS — Z902 Acquired absence of lung [part of]: Secondary | ICD-10-CM

## 2014-07-23 HISTORY — PX: VIDEO ASSISTED THORACOSCOPY (VATS)/ LOBECTOMY: SHX6169

## 2014-07-23 HISTORY — PX: VIDEO BRONCHOSCOPY: SHX5072

## 2014-07-23 LAB — PREPARE RBC (CROSSMATCH)

## 2014-07-23 SURGERY — BRONCHOSCOPY, VIDEO-ASSISTED
Anesthesia: General | Site: Chest | Laterality: Right

## 2014-07-23 MED ORDER — ARTIFICIAL TEARS OP OINT
TOPICAL_OINTMENT | OPHTHALMIC | Status: DC | PRN
  Administered 2014-07-23: 1 via OPHTHALMIC

## 2014-07-23 MED ORDER — BUPIVACAINE HCL (PF) 0.5 % IJ SOLN
INTRAMUSCULAR | Status: AC
  Filled 2014-07-23: qty 10

## 2014-07-23 MED ORDER — GLYCOPYRROLATE 0.2 MG/ML IJ SOLN
INTRAMUSCULAR | Status: DC | PRN
  Administered 2014-07-23: 0.1 mg via INTRAVENOUS
  Administered 2014-07-23: .8 mg via INTRAVENOUS

## 2014-07-23 MED ORDER — ONDANSETRON HCL 4 MG/2ML IJ SOLN
INTRAMUSCULAR | Status: AC
  Filled 2014-07-23: qty 2

## 2014-07-23 MED ORDER — FENTANYL CITRATE 0.05 MG/ML IJ SOLN
INTRAMUSCULAR | Status: AC
  Filled 2014-07-23: qty 2

## 2014-07-23 MED ORDER — PHENYLEPHRINE HCL 10 MG/ML IJ SOLN
INTRAMUSCULAR | Status: DC | PRN
  Administered 2014-07-23 (×5): 80 ug via INTRAVENOUS

## 2014-07-23 MED ORDER — GLYCOPYRROLATE 0.2 MG/ML IJ SOLN
INTRAMUSCULAR | Status: AC
  Filled 2014-07-23: qty 1

## 2014-07-23 MED ORDER — FENTANYL CITRATE 0.05 MG/ML IJ SOLN
INTRAMUSCULAR | Status: AC
  Filled 2014-07-23: qty 5

## 2014-07-23 MED ORDER — GLYCOPYRROLATE 0.2 MG/ML IJ SOLN
INTRAMUSCULAR | Status: AC
  Filled 2014-07-23: qty 2

## 2014-07-23 MED ORDER — FENTANYL CITRATE 0.05 MG/ML IJ SOLN
INTRAMUSCULAR | Status: DC | PRN
  Administered 2014-07-23: 50 ug via INTRAVENOUS
  Administered 2014-07-23: 25 ug via INTRAVENOUS
  Administered 2014-07-23: 100 ug via INTRAVENOUS
  Administered 2014-07-23: 50 ug via INTRAVENOUS
  Administered 2014-07-23: 250 ug via INTRAVENOUS
  Administered 2014-07-23: 50 ug via INTRAVENOUS
  Administered 2014-07-23: 25 ug via INTRAVENOUS

## 2014-07-23 MED ORDER — PROPOFOL 10 MG/ML IV BOLUS
INTRAVENOUS | Status: DC | PRN
  Administered 2014-07-23: 100 mg via INTRAVENOUS

## 2014-07-23 MED ORDER — SENNOSIDES-DOCUSATE SODIUM 8.6-50 MG PO TABS
1.0000 | ORAL_TABLET | Freq: Every day | ORAL | Status: DC
  Administered 2014-07-23 – 2014-07-24 (×2): 1 via ORAL
  Filled 2014-07-23 (×6): qty 1

## 2014-07-23 MED ORDER — NALOXONE HCL 0.4 MG/ML IJ SOLN: 0.4000 mg | INTRAMUSCULAR | Status: DC | PRN

## 2014-07-23 MED ORDER — DIPHENHYDRAMINE HCL 12.5 MG/5ML PO ELIX
12.5000 mg | ORAL_SOLUTION | Freq: Four times a day (QID) | ORAL | Status: DC | PRN
  Filled 2014-07-23: qty 5

## 2014-07-23 MED ORDER — POLYETHYLENE GLYCOL 3350 17 G PO PACK
17.0000 g | PACK | Freq: Every day | ORAL | Status: DC | PRN
  Administered 2014-07-26: 17 g via ORAL
  Filled 2014-07-23: qty 1

## 2014-07-23 MED ORDER — HYDROMORPHONE HCL 1 MG/ML IJ SOLN
INTRAMUSCULAR | Status: AC
  Filled 2014-07-23: qty 1

## 2014-07-23 MED ORDER — ROCURONIUM BROMIDE 100 MG/10ML IV SOLN
INTRAVENOUS | Status: DC | PRN
  Administered 2014-07-23: 50 mg via INTRAVENOUS

## 2014-07-23 MED ORDER — VECURONIUM BROMIDE 10 MG IV SOLR
INTRAVENOUS | Status: DC | PRN
  Administered 2014-07-23: 1 mg via INTRAVENOUS

## 2014-07-23 MED ORDER — LACTATED RINGERS IV SOLN
INTRAVENOUS | Status: DC | PRN
  Administered 2014-07-23 (×4): via INTRAVENOUS

## 2014-07-23 MED ORDER — DEXAMETHASONE SODIUM PHOSPHATE 4 MG/ML IJ SOLN
INTRAMUSCULAR | Status: DC | PRN
  Administered 2014-07-23: 8 mg via INTRAVENOUS

## 2014-07-23 MED ORDER — BISACODYL 5 MG PO TBEC
10.0000 mg | DELAYED_RELEASE_TABLET | Freq: Every day | ORAL | Status: DC
  Administered 2014-07-23 – 2014-07-25 (×3): 10 mg via ORAL
  Filled 2014-07-23 (×3): qty 2

## 2014-07-23 MED ORDER — FENTANYL 10 MCG/ML IV SOLN
INTRAVENOUS | Status: DC
  Administered 2014-07-23: 30 ug via INTRAVENOUS
  Administered 2014-07-23: 60 ug via INTRAVENOUS
  Administered 2014-07-23 – 2014-07-24 (×3): 45 ug via INTRAVENOUS
  Administered 2014-07-24 (×2): 0 ug/h via INTRAVENOUS
  Administered 2014-07-24: 0 ug via INTRAVENOUS
  Administered 2014-07-24: 15 ug via INTRAVENOUS
  Administered 2014-07-25: 75 ug via INTRAVENOUS
  Administered 2014-07-25 – 2014-07-26 (×4): 0 ug via INTRAVENOUS
  Filled 2014-07-23 (×2): qty 50

## 2014-07-23 MED ORDER — LIDOCAINE HCL (CARDIAC) 20 MG/ML IV SOLN
INTRAVENOUS | Status: DC | PRN
  Administered 2014-07-23: 25 mg via INTRAVENOUS

## 2014-07-23 MED ORDER — ACETAMINOPHEN 500 MG PO TABS
1000.0000 mg | ORAL_TABLET | Freq: Four times a day (QID) | ORAL | Status: DC
  Filled 2014-07-23 (×17): qty 2

## 2014-07-23 MED ORDER — ROCURONIUM BROMIDE 50 MG/5ML IV SOLN
INTRAVENOUS | Status: AC
  Filled 2014-07-23: qty 1

## 2014-07-23 MED ORDER — BUPIVACAINE 0.5 % ON-Q PUMP SINGLE CATH 400 ML
400.0000 mL | INJECTION | Status: DC
  Filled 2014-07-23: qty 400

## 2014-07-23 MED ORDER — DEXAMETHASONE SODIUM PHOSPHATE 4 MG/ML IJ SOLN
INTRAMUSCULAR | Status: AC
  Filled 2014-07-23: qty 2

## 2014-07-23 MED ORDER — ONDANSETRON HCL 4 MG/2ML IJ SOLN
INTRAMUSCULAR | Status: DC | PRN
  Administered 2014-07-23: 4 mg via INTRAVENOUS

## 2014-07-23 MED ORDER — MIDAZOLAM HCL 2 MG/2ML IJ SOLN
INTRAMUSCULAR | Status: AC
  Filled 2014-07-23: qty 2

## 2014-07-23 MED ORDER — DEXTROSE 5 % IV SOLN
1.5000 g | Freq: Two times a day (BID) | INTRAVENOUS | Status: AC
  Administered 2014-07-23 – 2014-07-24 (×2): 1.5 g via INTRAVENOUS
  Filled 2014-07-23 (×2): qty 1.5

## 2014-07-23 MED ORDER — POTASSIUM CHLORIDE 10 MEQ/50ML IV SOLN: 10.0000 meq | Freq: Every day | INTRAVENOUS | Status: DC | PRN

## 2014-07-23 MED ORDER — ALBUTEROL SULFATE HFA 108 (90 BASE) MCG/ACT IN AERS
INHALATION_SPRAY | RESPIRATORY_TRACT | Status: DC | PRN
  Administered 2014-07-23: 2 via RESPIRATORY_TRACT

## 2014-07-23 MED ORDER — EPHEDRINE SULFATE 50 MG/ML IJ SOLN
INTRAMUSCULAR | Status: AC
  Filled 2014-07-23: qty 1

## 2014-07-23 MED ORDER — MIDAZOLAM HCL 5 MG/5ML IJ SOLN
INTRAMUSCULAR | Status: DC | PRN
  Administered 2014-07-23: 2 mg via INTRAVENOUS

## 2014-07-23 MED ORDER — ACETAMINOPHEN 160 MG/5ML PO SOLN
1000.0000 mg | Freq: Four times a day (QID) | ORAL | Status: DC
  Administered 2014-07-23 – 2014-07-25 (×8): 1000 mg via ORAL
  Filled 2014-07-23 (×2): qty 40
  Filled 2014-07-23 (×3): qty 40.6

## 2014-07-23 MED ORDER — BUPIVACAINE HCL (PF) 0.5 % IJ SOLN
INTRAMUSCULAR | Status: DC | PRN
  Administered 2014-07-23: 10 mL

## 2014-07-23 MED ORDER — VERAPAMIL HCL ER 180 MG PO TBCR
180.0000 mg | EXTENDED_RELEASE_TABLET | Freq: Every day | ORAL | Status: DC
  Administered 2014-07-23 – 2014-07-27 (×4): 180 mg via ORAL
  Filled 2014-07-23 (×6): qty 1

## 2014-07-23 MED ORDER — HEMOSTATIC AGENTS (NO CHARGE) OPTIME
TOPICAL | Status: DC | PRN
  Administered 2014-07-23: 1 via TOPICAL

## 2014-07-23 MED ORDER — PANTOPRAZOLE SODIUM 40 MG PO TBEC
40.0000 mg | DELAYED_RELEASE_TABLET | Freq: Every day | ORAL | Status: DC
  Administered 2014-07-24 – 2014-07-28 (×5): 40 mg via ORAL
  Filled 2014-07-23 (×5): qty 1

## 2014-07-23 MED ORDER — SODIUM CHLORIDE 0.9 % IJ SOLN: 9.0000 mL | INTRAMUSCULAR | Status: DC | PRN

## 2014-07-23 MED ORDER — HYDROMORPHONE HCL 1 MG/ML IJ SOLN
0.2500 mg | INTRAMUSCULAR | Status: DC | PRN
  Administered 2014-07-23 (×4): 0.5 mg via INTRAVENOUS

## 2014-07-23 MED ORDER — NEOSTIGMINE METHYLSULFATE 10 MG/10ML IV SOLN
INTRAVENOUS | Status: DC | PRN
  Administered 2014-07-23: 5 mg via INTRAVENOUS

## 2014-07-23 MED ORDER — PROMETHAZINE HCL 25 MG/ML IJ SOLN: 6.2500 mg | INTRAMUSCULAR | Status: DC | PRN

## 2014-07-23 MED ORDER — SODIUM CHLORIDE 0.9 % IJ SOLN
INTRAMUSCULAR | Status: AC
  Filled 2014-07-23: qty 10

## 2014-07-23 MED ORDER — EPHEDRINE SULFATE 50 MG/ML IJ SOLN
INTRAMUSCULAR | Status: DC | PRN
  Administered 2014-07-23: 10 mg via INTRAVENOUS
  Administered 2014-07-23: 5 mg via INTRAVENOUS
  Administered 2014-07-23: 10 mg via INTRAVENOUS
  Administered 2014-07-23: 15 mg via INTRAVENOUS

## 2014-07-23 MED ORDER — BUPIVACAINE 0.5 % ON-Q PUMP SINGLE CATH 400 ML
INJECTION | Status: DC | PRN
  Administered 2014-07-23: 400 mL

## 2014-07-23 MED ORDER — STERILE WATER FOR INJECTION IJ SOLN
INTRAMUSCULAR | Status: AC
  Filled 2014-07-23: qty 10

## 2014-07-23 MED ORDER — MIDAZOLAM HCL 2 MG/2ML IJ SOLN: 0.5000 mg | Freq: Once | INTRAMUSCULAR | Status: DC | PRN

## 2014-07-23 MED ORDER — CETYLPYRIDINIUM CHLORIDE 0.05 % MT LIQD
7.0000 mL | Freq: Two times a day (BID) | OROMUCOSAL | Status: DC
  Administered 2014-07-23 – 2014-07-28 (×10): 7 mL via OROMUCOSAL

## 2014-07-23 MED ORDER — NEOSTIGMINE METHYLSULFATE 10 MG/10ML IV SOLN
INTRAVENOUS | Status: AC
  Filled 2014-07-23: qty 1

## 2014-07-23 MED ORDER — PHENYLEPHRINE 40 MCG/ML (10ML) SYRINGE FOR IV PUSH (FOR BLOOD PRESSURE SUPPORT)
PREFILLED_SYRINGE | INTRAVENOUS | Status: AC
  Filled 2014-07-23: qty 10

## 2014-07-23 MED ORDER — DEXTROSE-NACL 5-0.9 % IV SOLN
INTRAVENOUS | Status: DC
  Administered 2014-07-23: 18:00:00 via INTRAVENOUS

## 2014-07-23 MED ORDER — PROPOFOL 10 MG/ML IV BOLUS
INTRAVENOUS | Status: AC
  Filled 2014-07-23: qty 20

## 2014-07-23 MED ORDER — ARTIFICIAL TEARS OP OINT
TOPICAL_OINTMENT | OPHTHALMIC | Status: AC
  Filled 2014-07-23: qty 3.5

## 2014-07-23 MED ORDER — LIDOCAINE HCL (CARDIAC) 20 MG/ML IV SOLN
INTRAVENOUS | Status: AC
  Filled 2014-07-23: qty 5

## 2014-07-23 MED ORDER — VECURONIUM BROMIDE 10 MG IV SOLR
INTRAVENOUS | Status: AC
  Filled 2014-07-23: qty 10

## 2014-07-23 MED ORDER — 0.9 % SODIUM CHLORIDE (POUR BTL) OPTIME
TOPICAL | Status: DC | PRN
  Administered 2014-07-23: 2000 mL

## 2014-07-23 MED ORDER — ALBUTEROL SULFATE HFA 108 (90 BASE) MCG/ACT IN AERS
INHALATION_SPRAY | RESPIRATORY_TRACT | Status: AC
  Filled 2014-07-23: qty 6.7

## 2014-07-23 MED ORDER — ESMOLOL HCL 10 MG/ML IV SOLN
INTRAVENOUS | Status: AC
  Filled 2014-07-23: qty 10

## 2014-07-23 MED ORDER — DIPHENHYDRAMINE HCL 50 MG/ML IJ SOLN: 12.5000 mg | Freq: Four times a day (QID) | INTRAMUSCULAR | Status: DC | PRN

## 2014-07-23 MED ORDER — MEPERIDINE HCL 25 MG/ML IJ SOLN: 6.2500 mg | INTRAMUSCULAR | Status: DC | PRN

## 2014-07-23 MED ORDER — SUCCINYLCHOLINE CHLORIDE 20 MG/ML IJ SOLN
INTRAMUSCULAR | Status: AC
  Filled 2014-07-23: qty 1

## 2014-07-23 SURGICAL SUPPLY — 108 items
ADH SKN CLS APL DERMABOND .7 (GAUZE/BANDAGES/DRESSINGS)
APL SRG 22X2 LUM MLBL SLNT (VASCULAR PRODUCTS)
APPLICATOR TIP EXT COSEAL (VASCULAR PRODUCTS) IMPLANT
APPLIER CLIP ROT 10 11.4 M/L (STAPLE) ×4
APR CLP MED LRG 11.4X10 (STAPLE) ×2
BAG DECANTER FOR FLEXI CONT (MISCELLANEOUS) IMPLANT
BALL CTTN LRG ABS STRL LF (GAUZE/BANDAGES/DRESSINGS)
BLADE SURG 10 STRL SS (BLADE) ×2 IMPLANT
BLADE SURG 11 STRL SS (BLADE) ×4 IMPLANT
BRUSH CYTOL CELLEBRITY 1.5X140 (MISCELLANEOUS) IMPLANT
CANISTER SUCTION 2500CC (MISCELLANEOUS) ×4 IMPLANT
CATH KIT ON Q 5IN SLV (PAIN MANAGEMENT) ×2 IMPLANT
CATH THORACIC 28FR (CATHETERS) ×2 IMPLANT
CATH THORACIC 36FR (CATHETERS) IMPLANT
CATH THORACIC 36FR RT ANG (CATHETERS) ×2 IMPLANT
CLIP APPLIE ROT 10 11.4 M/L (STAPLE) IMPLANT
CLIP TI WIDE RED SMALL 24 (CLIP) ×2 IMPLANT
CONN ST 1/4X3/8  BEN (MISCELLANEOUS) ×2
CONN ST 1/4X3/8 BEN (MISCELLANEOUS) IMPLANT
CONN Y 3/8X3/8X3/8  BEN (MISCELLANEOUS)
CONN Y 3/8X3/8X3/8 BEN (MISCELLANEOUS) IMPLANT
CONT SPEC 4OZ CLIKSEAL STRL BL (MISCELLANEOUS) ×14 IMPLANT
COTTONBALL LRG STERILE PKG (GAUZE/BANDAGES/DRESSINGS) IMPLANT
COVER SURGICAL LIGHT HANDLE (MISCELLANEOUS) ×4 IMPLANT
COVER TABLE BACK 60X90 (DRAPES) ×4 IMPLANT
DERMABOND ADVANCED (GAUZE/BANDAGES/DRESSINGS)
DERMABOND ADVANCED .7 DNX12 (GAUZE/BANDAGES/DRESSINGS) IMPLANT
DRAPE LAPAROSCOPIC ABDOMINAL (DRAPES) ×4 IMPLANT
DRAPE SLUSH/WARMER DISC (DRAPES) ×2 IMPLANT
DRAPE WARM FLUID 44X44 (DRAPE) ×2 IMPLANT
ELECT BLADE 4.0 EZ CLEAN MEGAD (MISCELLANEOUS)
ELECT BLADE 6.5 EXT (BLADE) ×2 IMPLANT
ELECT REM PT RETURN 9FT ADLT (ELECTROSURGICAL) ×4
ELECTRODE BLDE 4.0 EZ CLN MEGD (MISCELLANEOUS) IMPLANT
ELECTRODE REM PT RTRN 9FT ADLT (ELECTROSURGICAL) ×2 IMPLANT
FORCEPS BIOP RJ4 1.8 (CUTTING FORCEPS) IMPLANT
GAUZE SPONGE 4X4 12PLY STRL (GAUZE/BANDAGES/DRESSINGS) ×6 IMPLANT
GLOVE BIO SURGEON STRL SZ7.5 (GLOVE) ×10 IMPLANT
GLOVE BIOGEL PI IND STRL 6.5 (GLOVE) IMPLANT
GLOVE BIOGEL PI IND STRL 7.0 (GLOVE) IMPLANT
GLOVE BIOGEL PI INDICATOR 6.5 (GLOVE) ×2
GLOVE BIOGEL PI INDICATOR 7.0 (GLOVE) ×6
GOWN STRL REUS W/ TWL LRG LVL3 (GOWN DISPOSABLE) ×6 IMPLANT
GOWN STRL REUS W/TWL LRG LVL3 (GOWN DISPOSABLE) ×16
HANDLE STAPLE ENDO GIA SHORT (STAPLE) ×2
HEMOSTAT SURGICEL 2X14 (HEMOSTASIS) IMPLANT
KIT BASIN OR (CUSTOM PROCEDURE TRAY) ×4 IMPLANT
KIT CLEAN ENDO COMPLIANCE (KITS) ×4 IMPLANT
KIT ROOM TURNOVER OR (KITS) ×4 IMPLANT
KIT SUCTION CATH 14FR (SUCTIONS) ×2 IMPLANT
MARKER SKIN DUAL TIP RULER LAB (MISCELLANEOUS) ×2 IMPLANT
NDL BIOPSY TRANSBRONCH 21G (NEEDLE) IMPLANT
NDL BLUNT 18X1 FOR OR ONLY (NEEDLE) IMPLANT
NEEDLE BIOPSY TRANSBRONCH 21G (NEEDLE) IMPLANT
NEEDLE BLUNT 18X1 FOR OR ONLY (NEEDLE) IMPLANT
NEEDLE HYPO 22GX1.5 SAFETY (NEEDLE) IMPLANT
NS IRRIG 1000ML POUR BTL (IV SOLUTION) ×14 IMPLANT
OIL SILICONE PENTAX (PARTS (SERVICE/REPAIRS)) ×4 IMPLANT
PACK CHEST (CUSTOM PROCEDURE TRAY) ×4 IMPLANT
PAD ARMBOARD 7.5X6 YLW CONV (MISCELLANEOUS) ×12 IMPLANT
RELOAD EGIA 45 MED/THCK PURPLE (STAPLE) ×6 IMPLANT
RELOAD EGIA TRIS TAN 45 CVD (STAPLE) ×16 IMPLANT
RELOAD STAPLE 45 TAN MED CVD (STAPLE) IMPLANT
SEALANT PROGEL (MISCELLANEOUS) IMPLANT
SEALANT SURG COSEAL 4ML (VASCULAR PRODUCTS) IMPLANT
SEALANT SURG COSEAL 8ML (VASCULAR PRODUCTS) ×2 IMPLANT
SOLUTION ANTI FOG 6CC (MISCELLANEOUS) ×4 IMPLANT
SPONGE GAUZE 4X4 12PLY STER LF (GAUZE/BANDAGES/DRESSINGS) ×2 IMPLANT
SPONGE INTESTINAL PEANUT (DISPOSABLE) ×2 IMPLANT
SPONGE TONSIL 1.25 RF SGL STRG (GAUZE/BANDAGES/DRESSINGS) ×6 IMPLANT
STAPLER ENDO GIA 12 SHRT THIN (STAPLE) ×2 IMPLANT
STAPLER ENDO GIA 12MM SHORT (STAPLE) ×2 IMPLANT
STAPLER TA30 4.8 NON-ABS (STAPLE) ×2 IMPLANT
SUT CHROMIC 3 0 SH 27 (SUTURE) IMPLANT
SUT ETHILON 3 0 PS 1 (SUTURE) IMPLANT
SUT PROLENE 3 0 SH DA (SUTURE) IMPLANT
SUT PROLENE 4 0 RB 1 (SUTURE)
SUT PROLENE 4-0 RB1 .5 CRCL 36 (SUTURE) IMPLANT
SUT PROLENE 6 0 C 1 30 (SUTURE) IMPLANT
SUT SILK  1 MH (SUTURE) ×4
SUT SILK 1 MH (SUTURE) ×4 IMPLANT
SUT SILK 1 TIES 10X30 (SUTURE) ×2 IMPLANT
SUT SILK 2 0SH CR/8 30 (SUTURE) IMPLANT
SUT SILK 3 0 SH CR/8 (SUTURE) ×2 IMPLANT
SUT SILK 3 0SH CR/8 30 (SUTURE) IMPLANT
SUT VIC AB 1 CTX 18 (SUTURE) ×4 IMPLANT
SUT VIC AB 2 TP1 27 (SUTURE) IMPLANT
SUT VIC AB 2-0 CTX 36 (SUTURE) ×2 IMPLANT
SUT VIC AB 3-0 SH 18 (SUTURE) IMPLANT
SUT VIC AB 3-0 X1 27 (SUTURE) ×2 IMPLANT
SUT VICRYL 0 UR6 27IN ABS (SUTURE) IMPLANT
SUT VICRYL 2 TP 1 (SUTURE) ×4 IMPLANT
SWAB COLLECTION DEVICE MRSA (MISCELLANEOUS) IMPLANT
SYR 20ML ECCENTRIC (SYRINGE) ×4 IMPLANT
SYR 5ML LUER SLIP (SYRINGE) ×4 IMPLANT
SYR CONTROL 10ML LL (SYRINGE) IMPLANT
SYSTEM SAHARA CHEST DRAIN ATS (WOUND CARE) ×4 IMPLANT
TAPE CLOTH SURG 4X10 WHT LF (GAUZE/BANDAGES/DRESSINGS) ×2 IMPLANT
TIP APPLICATOR SPRAY EXTEND 16 (VASCULAR PRODUCTS) IMPLANT
TOWEL OR 17X24 6PK STRL BLUE (TOWEL DISPOSABLE) ×8 IMPLANT
TOWEL OR 17X26 10 PK STRL BLUE (TOWEL DISPOSABLE) ×8 IMPLANT
TRAP SPECIMEN MUCOUS 40CC (MISCELLANEOUS) ×4 IMPLANT
TRAY FOLEY CATH 16FRSI W/METER (SET/KITS/TRAYS/PACK) ×4 IMPLANT
TUBE ANAEROBIC SPECIMEN COL (MISCELLANEOUS) IMPLANT
TUBE CONNECTING 20'X1/4 (TUBING) ×2
TUBE CONNECTING 20X1/4 (TUBING) ×6 IMPLANT
TUNNELER SHEATH ON-Q 11GX8 DSP (PAIN MANAGEMENT) ×4 IMPLANT
WATER STERILE IRR 1000ML POUR (IV SOLUTION) ×8 IMPLANT

## 2014-07-23 NOTE — Progress Notes (Signed)
The patient was examined and preop studies reviewed. There has been no change from the prior exam and the patient is ready for surgery.   Plan Right Vats  videobronchoscopy on [name] [name]

## 2014-07-23 NOTE — Anesthesia Preprocedure Evaluation (Addendum)
Anesthesia Evaluation  Patient identified by MRN, date of birth, ID band Patient awake    Reviewed: Allergy & Precautions, NPO status , Patient's Chart, lab work & pertinent test results, reviewed documented beta blocker date and time   History of Anesthesia Complications (+) PONV and history of anesthetic complications  Airway Mallampati: I  TM Distance: >3 FB Neck ROM: Full    Dental  (+) Teeth Intact, Dental Advisory Given   Pulmonary shortness of breath, COPDformer smoker (quit '14),  breath sounds clear to auscultation        Cardiovascular hypertension, Pt. on medications - anginaRhythm:Regular Rate:Normal  '15 ECHO: EF 61-44%, grade 1 diastolic dysfunction, valves ok    Neuro/Psych  Headaches, Anxiety    GI/Hepatic Neg liver ROS, GERD-  Medicated and Controlled,  Endo/Other  negative endocrine ROS  Renal/GU Congenital renal atrophy of one kidney     Musculoskeletal   Abdominal   Peds  Hematology negative hematology ROS (+)   Anesthesia Other Findings   Reproductive/Obstetrics                            Anesthesia Physical Anesthesia Plan  ASA: III  Anesthesia Plan: General   Post-op Pain Management:    Induction: Intravenous  Airway Management Planned: Oral ETT and Double Lumen EBT  Additional Equipment: Arterial line, CVP and Ultrasound Guidance Line Placement  Intra-op Plan:   Post-operative Plan: Extubation in OR  Informed Consent: I have reviewed the patients History and Physical, chart, labs and discussed the procedure including the risks, benefits and alternatives for the proposed anesthesia with the patient or authorized representative who has indicated his/her understanding and acceptance.   Dental advisory given  Plan Discussed with: CRNA and Surgeon  Anesthesia Plan Comments: (Plan routine monitors, A Line, CVP, GETA with DLT)        Anesthesia Quick  Evaluation

## 2014-07-23 NOTE — Anesthesia Procedure Notes (Addendum)
Procedure Name: Intubation Date/Time: 07/23/2014 7:37 AM Performed by: Julian Reil Pre-anesthesia Checklist: Patient identified, Emergency Drugs available, Suction available and Patient being monitored Patient Re-evaluated:Patient Re-evaluated prior to inductionOxygen Delivery Method: Circle system utilized Preoxygenation: Pre-oxygenation with 100% oxygen Intubation Type: IV induction Ventilation: Mask ventilation without difficulty Laryngoscope Size: Mac and 3 Grade View: Grade II Tube type: Oral Tube size: 8.5 mm Number of attempts: 1 Airway Equipment and Method: Stylet Placement Confirmation: ETT inserted through vocal cords under direct vision,  positive ETCO2 and breath sounds checked- equal and bilateral Secured at: 21 cm Tube secured with: Tape Dental Injury: Teeth and Oropharynx as per pre-operative assessment    Procedure Name: Intubation Date/Time: 07/23/2014 8:11 AM Performed by: Julian Reil Pre-anesthesia Checklist: Patient identified, Emergency Drugs available, Suction available and Patient being monitored Patient Re-evaluated:Patient Re-evaluated prior to inductionOxygen Delivery Method: Circle system utilized Preoxygenation: Pre-oxygenation with 100% oxygen Intubation Type: Inhalational induction with existing ETT Laryngoscope Size: Mac and 3 Grade View: Grade II Tube type: Oral Endobronchial tube: Left, EBT position confirmed by auscultation, Double lumen EBT and EBT position confirmed by fiberoptic bronchoscope and 37 Fr Number of attempts: 1 Airway Equipment and Method: Stylet Placement Confirmation: ETT inserted through vocal cords under direct vision,  positive ETCO2 and breath sounds checked- equal and bilateral Secured at: 31 cm Tube secured with: Tape Dental Injury: Teeth and Oropharynx as per pre-operative assessment

## 2014-07-23 NOTE — Transfer of Care (Signed)
Immediate Anesthesia Transfer of Care Note  Patient: [name] [name]  Procedure(s) Performed: Procedure(s): VIDEO BRONCHOSCOPY (N/A) VIDEO ASSISTED THORACOSCOPY (VATS)/ LOBECTOMY (Right)  Patient Location: PACU  Anesthesia Type:General  Level of Consciousness: awake, alert , oriented and patient cooperative  Airway & Oxygen Therapy: Patient Spontanous Breathing and Patient connected to nasal cannula oxygen  Post-op Assessment: Report given to RN, Post -op Vital signs reviewed and stable and Patient moving all extremities  Post vital signs: Reviewed and stable  Last Vitals:  Filed Vitals:   07/23/14 0614  BP: 141/92  Pulse: 79  Temp: 36.4 C  Resp: 20    Complications: No apparent anesthesia complications

## 2014-07-23 NOTE — Anesthesia Postprocedure Evaluation (Signed)
  Anesthesia Post-op Note  Patient: [name] [name]  Procedure(s) Performed: Procedure(s): VIDEO BRONCHOSCOPY (N/A) VIDEO ASSISTED THORACOSCOPY (VATS)/ LOBECTOMY (Right)  Patient Location: PACU  Anesthesia Type:General  Level of Consciousness: awake, alert , oriented and patient cooperative  Airway and Oxygen Therapy: Patient Spontanous Breathing and Patient connected to nasal cannula oxygen  Post-op Pain: mild  Post-op Assessment: Post-op Vital signs reviewed, Patient's Cardiovascular Status Stable, Respiratory Function Stable, Patent Airway, No signs of Nausea or vomiting and Pain level controlled  Post-op Vital Signs: Reviewed and stable  Last Vitals:  Filed Vitals:   07/23/14 1300  BP: 128/61  Pulse: 100  Temp:   Resp: 16    Complications: No apparent anesthesia complications

## 2014-07-23 NOTE — Progress Notes (Signed)
Utilization Review Completed.Donne Anon T2/16/2016

## 2014-07-23 NOTE — Progress Notes (Signed)
[facility] BRIEF SICU PROGRESS NOTE  Day of Surgery  S/P Procedure(s) (LRB): VIDEO BRONCHOSCOPY (N/A) VIDEO ASSISTED THORACOSCOPY (VATS)/ LOBECTOMY (Right)   Resting quietly w/ good analgesia Breathing comfortably w/ O2 sats 95% on 2 L/min Low volume thin serosanguinous output via chest tubes, + air leak  Plan: Continue routine early postop  Keane Martelli H 07/23/2014 4:54 PM

## 2014-07-23 NOTE — Brief Op Note (Signed)
07/23/2014      [address]       [place] [address]             [phone]     07/23/2014  10:59 AM  PATIENT:  [name] [name]  59 y.o. female  PRE-OPERATIVE DIAGNOSIS:  right lung cancer  POST-OPERATIVE DIAGNOSIS:  right lung cancer  PROCEDURE:  Procedure(s): VIDEO BRONCHOSCOPY VIDEO ASSISTED THORACOSCOPY (VATS)/ MINI-THORACOTOMY/ LOBECTOMY(RLL) LN SAMPLING  SURGEON:  Surgeon(s): Ivin Poot, MD  PHYSICIAN ASSISTANT: Maddisen Vought PA-C  ANESTHESIA:   general  SPECIMEN:  Source of Specimen:  RLL, MULTIPLE LN SAMPLES  DISPOSITION OF SPECIMEN:  Pathology  DRAINS: 2 Chest Tube(s) in the RIGHT HEMITHORAX   PATIENT CONDITION:  PACU - hemodynamically stable.  PRE-OPERATIVE WEIGHT: 68kg  EBL 244 CC  COMPLICATIONS: NO KNOWN  FROZEN: NEG MARGIN, NON-SMALL CELL CARCINOMA

## 2014-07-24 ENCOUNTER — Inpatient Hospital Stay (HOSPITAL_COMMUNITY): Payer: Managed Care, Other (non HMO)

## 2014-07-24 LAB — CBC
HCT: 34.7 % — ABNORMAL LOW (ref 36.0–46.0)
Hemoglobin: 11.2 g/dL — ABNORMAL LOW (ref 12.0–15.0)
MCH: 29.8 pg (ref 26.0–34.0)
MCHC: 32.3 g/dL (ref 30.0–36.0)
MCV: 92.3 fL (ref 78.0–100.0)
PLATELETS: 235 10*3/uL (ref 150–400)
RBC: 3.76 MIL/uL — ABNORMAL LOW (ref 3.87–5.11)
RDW: 14.7 % (ref 11.5–15.5)
WBC: 13.6 10*3/uL — AB (ref 4.0–10.5)

## 2014-07-24 LAB — BASIC METABOLIC PANEL
Anion gap: 3 — ABNORMAL LOW (ref 5–15)
BUN: 6 mg/dL (ref 6–23)
CALCIUM: 8.8 mg/dL (ref 8.4–10.5)
CO2: 25 mmol/L (ref 19–32)
CREATININE: 0.63 mg/dL (ref 0.50–1.10)
Chloride: 109 mmol/L (ref 96–112)
GFR calc non Af Amer: >90 mL/min (ref >90)
GLUCOSE: 157 mg/dL — AB (ref 70–99)
Potassium: 3.8 mmol/L (ref 3.5–5.1)
Sodium: 137 mmol/L (ref 135–145)

## 2014-07-24 LAB — POCT I-STAT 3, ART BLOOD GAS (G3+)
Bicarbonate: 24.7 mEq/L — ABNORMAL HIGH (ref 20.0–24.0)
O2 Saturation: 97 %
Patient temperature: 36.9
TCO2: 26 mmol/L (ref 0–100)
pCO2 arterial: 37.7 mmHg (ref 35.0–45.0)
pH, Arterial: 7.424 (ref 7.350–7.450)
pO2, Arterial: 84 mmHg (ref 80.0–100.0)

## 2014-07-24 MED ORDER — DEXTROSE-NACL 5-0.9 % IV SOLN
INTRAVENOUS | Status: DC
  Administered 2014-07-24: 10:00:00 via INTRAVENOUS
  Administered 2014-07-26: 10 mL/h via INTRAVENOUS
  Administered 2014-07-27: 03:00:00 via INTRAVENOUS

## 2014-07-24 MED ORDER — POTASSIUM CHLORIDE 10 MEQ/50ML IV SOLN
10.0000 meq | INTRAVENOUS | Status: AC
  Administered 2014-07-24 (×2): 10 meq via INTRAVENOUS
  Filled 2014-07-24 (×3): qty 50

## 2014-07-24 MED ORDER — FUROSEMIDE 10 MG/ML IJ SOLN
40.0000 mg | Freq: Once | INTRAMUSCULAR | Status: AC
  Administered 2014-07-24: 40 mg via INTRAVENOUS
  Filled 2014-07-24: qty 4

## 2014-07-24 NOTE — Op Note (Signed)
NAMEMALVINA, [name]  LOCATION:  2S02C                        FACILITY:  [facility]  PHYSICIAN:  [name] [name], M.D.  DATE OF BIRTH:  08/28/1955  DATE OF PROCEDURE:  07/23/2014 DATE OF DISCHARGE:                              OPERATIVE REPORT   OPERATION: 1. Video bronchoscopy. 2. Right VATS (video-assisted thoracoscopic surgery) with right lower     lobectomy. 3. Mediastinal lymph node dissection. 4. Placement of wound On-Q analgesic irrigation system.  SURGEON:  [name] [name], M.D.  ASSISTANT:  [name] Giovanni, PA-C.  ANESTHESIA:  General by Dr. Annye [name].  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  She underwent chemoradiation and then her CT scan was repeated.  There was significant decrease in the size of the primary tumor mass as well as the subcarinal lymph node disease.  She was assessed for surgical resection and repeat brain scan was negative. PFTs were adequate for resection and she tolerated the chemotherapy and radiation therapy without significant complications.  I examined the patient in the office and reviewed the results of her scans and reviewed the indications and expected benefits of right VATS with resection of the right lower lobe and mediastinal node dissection.  I discussed the alternatives to surgery as well  as the potential risks of surgery including the risks of bleeding, blood transfusion requirement, prolonged air leak requiring chest tubes, postoperative respiratory failure, requiring mechanical ventilation and/or tracheostomy, postoperative pneumonia, infection, and death.  After reviewing these issues, she demonstrated her understanding and agreed to proceed with surgery under what I felt was an informed consent.  OPERATIVE FINDINGS: 1. Postradiation thickening of the right bronchus. 2. Successful right lower lobectomy with clear bronchial margins. 3. Hilar and subcarinal lymph nodes submitted for permanent pathology. 4. Residual tumor in the right lower lobe identified as non-small cell     carcinoma.  OPERATIVE PROCEDURE:  The patient was brought to the operating room and placed supine on the operating room table, where general anesthesia was induced.  A single lumen endotracheal tube was passed.  A proper time- out was performed.  Through the endotracheal tube, a video bronchoscope was passed.  The distal trachea and carina were normal.  The left mainstem bronchus and the endobronchial segments of the left upper lobe and left lower lobe were examined and found to be normal without endobronchial lesions.  The right mainstem bronchus was normal.  The right upper lobe bronchus was normal.  The right middle lobe bronchus was normal.  The right lower lobe bronchus had some endobronchial thickening, but minimal secretions. The bronchoscope was withdrawn.  [name]  with a double-lumen endotracheal tube. The patient was then positioned right side up and the right chest was prepped and draped as a sterile field.  A second time-out was performed.  A small incision was made at the tip of the scapula and the fifth interspace and the VATS camera was inserted in the right hemithorax. The lung was inspected carefully.  There was no evidence of pleural adhesions or  pleural nodularity.  The scope was removed.  The incision was extended to approximately 6-7 cm in length.  The ribs were gently spread without division.  The right lower lobe was palpated and the mass was palpable in the medial aspect close to the bronchus.  The regional N1 lymph nodes were dissected away from the middle lobe.  The main pulmonary artery and the fissure was then identified and dissected.  The main pulmonary artery to the lower lobe was encircled with a vessel loop and then stapled and divided with the Endo-GIA stapling device.  The inferior pulmonary vein was dissected and encircled with a vessel loop and divided and stapled with an Endo-GIA stapling device.  At this point, the fissures were completed anteriorly between the lower lobe and the middle lobe of the Endo-GIA stapling devices.  The fissure was completed posteriorly between the lower lobe and the upper lobe with a Endo-GIA stapling device.  Care was taken to avoid injury to the posterior ascending branch of the pulmonary artery to the upper lobe.  This left the lower lobe bronchus.  This was carefully cleared of lymph nodes to be included in the specimen.  A TA 30 stapler was then placed obliquely to avoid compromise of the middle lobe bronchus and the bronchus was clamped with the stapler.  Air was insufflated in the right lung in the middle lobe and upper lobe inflated normally.  The stapler was then fired and then the bronchus was divided distal to the staple line and the specimen was removed.  It was sent for frozen section of the primary and bronchial margin.  The hilum was inspected.  There was no significant bleeding.  The staple lines were coated with a thin layer of medical adhesive - Coseal.  Next, the right mainstem bronchus was identified underneath the azygos vein and the dissection was taken up to the subcarinal space.  Lymph nodes and soft tissue in this area were then excised and submitted  for separate pathology.  Bleeding was minimal.  The upper lobe and middle lobe were carefully examined and there were no other abnormal suspicious lesions.  There were no other abnormal nodes along the upper lobe bronchus.  The staple lines were checked under warm saline by gently inflating the right lung and there were no significant air leaks.  Chest tubes were placed to drain the anterior and posterior right pleural space and brought out through separate incisions.  The lung was then inflated under direct vision.  The ribs were reapproximated with #2 Vicryl pericostals.  The muscle layers were closed with interrupted #1 Vicryl.  The subcutaneous layer was closed in a running Vicryl and skin was closed with a subcuticular skin suture.  The On-Q catheter was placed in the chest wall musculature and secured to the skin and connected to a 0.5% Marcaine reservoir.  The patient was then turned supine, extubated and returned to recovery room in stable condition.     [name] [name], M.D.     PV/MEDQ  D:  07/23/2014  T:  07/24/2014  Job:  [id number]  cc:   Eilleen Kempf, M.D. Lora Paula, M.D.

## 2014-07-25 ENCOUNTER — Encounter (HOSPITAL_COMMUNITY): Payer: Self-pay | Admitting: Cardiothoracic Surgery

## 2014-07-25 ENCOUNTER — Inpatient Hospital Stay (HOSPITAL_COMMUNITY): Payer: Managed Care, Other (non HMO)

## 2014-07-25 LAB — TYPE AND SCREEN
ABO/RH(D): A POS
Antibody Screen: NEGATIVE
Unit division: 0
Unit division: 0
Unit division: 0
Unit division: 0

## 2014-07-25 LAB — CBC
HEMATOCRIT: 34.9 % — AB (ref 36.0–46.0)
Hemoglobin: 11.2 g/dL — ABNORMAL LOW (ref 12.0–15.0)
MCH: 30 pg (ref 26.0–34.0)
MCHC: 32.1 g/dL (ref 30.0–36.0)
MCV: 93.6 fL (ref 78.0–100.0)
Platelets: 213 10*3/uL (ref 150–400)
RBC: 3.73 MIL/uL — ABNORMAL LOW (ref 3.87–5.11)
RDW: 14.9 % (ref 11.5–15.5)
WBC: 10.4 10*3/uL (ref 4.0–10.5)

## 2014-07-25 LAB — COMPREHENSIVE METABOLIC PANEL
ALK PHOS: 82 U/L (ref 39–117)
ALT: 14 U/L (ref 0–35)
ANION GAP: 5 (ref 5–15)
AST: 17 U/L (ref 0–37)
Albumin: 2.9 g/dL — ABNORMAL LOW (ref 3.5–5.2)
BUN: 8 mg/dL (ref 6–23)
CO2: 27 mmol/L (ref 19–32)
Calcium: 9.1 mg/dL (ref 8.4–10.5)
Chloride: 108 mmol/L (ref 96–112)
Creatinine, Ser: 0.68 mg/dL (ref 0.50–1.10)
GFR calc Af Amer: >90 mL/min (ref >90)
GFR calc non Af Amer: >90 mL/min (ref >90)
Glucose, Bld: 102 mg/dL — ABNORMAL HIGH (ref 70–99)
Potassium: 3.5 mmol/L (ref 3.5–5.1)
Sodium: 140 mmol/L (ref 135–145)
TOTAL PROTEIN: 5.7 g/dL — AB (ref 6.0–8.3)
Total Bilirubin: 0.6 mg/dL (ref 0.3–1.2)

## 2014-07-25 MED ORDER — POTASSIUM CHLORIDE 10 MEQ/50ML IV SOLN
10.0000 meq | INTRAVENOUS | Status: AC
  Administered 2014-07-25 (×2): 10 meq via INTRAVENOUS
  Filled 2014-07-25 (×2): qty 50

## 2014-07-25 MED ORDER — ONDANSETRON HCL 4 MG/2ML IJ SOLN
4.0000 mg | Freq: Four times a day (QID) | INTRAMUSCULAR | Status: DC | PRN
  Administered 2014-07-25 – 2014-07-26 (×2): 4 mg via INTRAVENOUS
  Filled 2014-07-25 (×2): qty 2

## 2014-07-25 NOTE — Progress Notes (Addendum)
      [address]       [place] [address]             [phone]      2 Days Post-Op Procedure(s) (LRB): VIDEO BRONCHOSCOPY (N/A) VIDEO ASSISTED THORACOSCOPY (VATS)/ LOBECTOMY (Right)   Subjective:  [name] [name] states she is doing better.  Overall she states her pain control has improved.  She is tolerating a full liquid diet without difficulty.  Objective: Vital signs in last 24 hours: Temp:  [97.8 F (36.6 C)-98.1 F (36.7 C)] 97.8 F (36.6 C) (02/18 0500) Pulse Rate:  [72-94] 73 (02/18 0500) Cardiac Rhythm:  [-] Normal sinus rhythm (02/18 0759) Resp:  [18-20] 18 (02/18 0800) BP: (121-149)/(61-83) 133/64 mmHg (02/18 0500) SpO2:  [94 %-100 %] 96 % (02/18 0800)  Intake/Output from previous day: 02/17 0701 - 02/18 0700 In: 794.5 [P.O.:480; I.V.:264.5; IV Piggyback:50] Out: 3140 [Urine:3050; Chest Tube:90]  General appearance: alert, cooperative and no distress Heart: regular rate and rhythm Lungs: clear to auscultation bilaterally Abdomen: soft, non-tender; bowel sounds normal; no masses,  no organomegaly Extremities: extremities normal, atraumatic, no cyanosis or edema Wound: clean and dry  Lab Results:  Recent Labs  07/24/14 0500 07/25/14 0500  WBC 13.6* 10.4  HGB 11.2* 11.2*  HCT 34.7* 34.9*  PLT 235 213   BMET:  Recent Labs  07/24/14 0500 07/25/14 0500  NA 137 140  K 3.8 3.5  CL 109 108  CO2 25 27  GLUCOSE 157* 102*  BUN 6 8  CREATININE 0.63 0.68  CALCIUM 8.8 9.1    PT/INR: No results for input(s): LABPROT, INR in the last 72 hours. ABG    Component Value Date/Time   PHART 7.424 07/24/2014 0538   HCO3 24.7* 07/24/2014 0538   TCO2 26 07/24/2014 0538   ACIDBASEDEF 1.5 07/19/2014 1325   O2SAT 97.0 07/24/2014 0538   CBG (last 3)  No results for input(s): GLUCAP in the last 72 hours.  Assessment/Plan: S/P Procedure(s) (LRB): VIDEO BRONCHOSCOPY (N/A) VIDEO ASSISTED THORACOSCOPY (VATS)/ LOBECTOMY (Right)  1. Chest  tube-653 cc output since surgery, some tidaling present with cough no definitive air leak appreciated- orders placed by Dr. Prescott Gum to remove 1 chest tube and place remaining chest tube to water seal 2. D/C Arterial LIne 3. D/C Foley catheter 4. Advance diet to regular diet 5. Decrease IV Fluids 6. Dispo- patient stable, orders per Dr. Prescott Gum   LOS: 2 days    [name] [name] [name] 07/25/2014 DC posterior tube today Path shows 1.8 cm adenoca primary with negative hilar, subcarinal nodes patient examined and medical record reviewed,agree with above note. VAN TRIGT III,PETER 07/25/2014

## 2014-07-25 NOTE — Progress Notes (Signed)
Pt c/o increased belching which is causing her to be nauseated; no PRN ordered; MD paged; new orders obtained; will administer when available.

## 2014-07-25 NOTE — Progress Notes (Signed)
Patient had 7 beats of V-Tach. MD on call notified. No new orders at this time. Will continue to monitor.

## 2014-07-25 NOTE — Progress Notes (Signed)
Posterior chest tube d/c at this time; chest tube placed to water seal at this time; pt assisted up OOB to chair post chest tube removal; will cont. To monitor.

## 2014-07-25 NOTE — Progress Notes (Signed)
Pt given 4mg  IV zofran at this time; will cont. To monitor.

## 2014-07-26 ENCOUNTER — Inpatient Hospital Stay (HOSPITAL_COMMUNITY): Payer: Managed Care, Other (non HMO)

## 2014-07-26 MED ORDER — BISACODYL 10 MG RE SUPP
10.0000 mg | Freq: Once | RECTAL | Status: AC
  Administered 2014-07-26: 10 mg via RECTAL
  Filled 2014-07-26: qty 1

## 2014-07-26 MED ORDER — HYDROMORPHONE HCL 1 MG/ML IJ SOLN: 1.0000 mg | INTRAMUSCULAR | Status: DC | PRN

## 2014-07-26 MED ORDER — TRAMADOL HCL 50 MG PO TABS: 50.0000 mg | ORAL_TABLET | Freq: Four times a day (QID) | ORAL | Status: DC | PRN

## 2014-07-26 MED ORDER — POTASSIUM CHLORIDE 10 MEQ/50ML IV SOLN
10.0000 meq | INTRAVENOUS | Status: AC
Start: 2014-07-26 — End: 2014-07-26
  Administered 2014-07-26 (×2): 10 meq via INTRAVENOUS
  Filled 2014-07-26 (×2): qty 50

## 2014-07-26 MED ORDER — METOPROLOL TARTRATE 12.5 MG HALF TABLET
12.5000 mg | ORAL_TABLET | Freq: Two times a day (BID) | ORAL | Status: DC
  Administered 2014-07-26 – 2014-07-28 (×5): 12.5 mg via ORAL
  Filled 2014-07-26 (×6): qty 1

## 2014-07-26 MED ORDER — HYDRALAZINE HCL 20 MG/ML IJ SOLN
5.0000 mg | Freq: Four times a day (QID) | INTRAMUSCULAR | Status: DC | PRN
  Administered 2014-07-26: 5 mg via INTRAVENOUS
  Filled 2014-07-26: qty 1

## 2014-07-26 MED ORDER — SIMETHICONE 80 MG PO CHEW
80.0000 mg | CHEWABLE_TABLET | Freq: Four times a day (QID) | ORAL | Status: DC | PRN
  Filled 2014-07-26: qty 1

## 2014-07-26 MED ORDER — METOPROLOL TARTRATE 1 MG/ML IV SOLN
5.0000 mg | INTRAVENOUS | Status: DC | PRN
  Administered 2014-07-26: 5 mg via INTRAVENOUS
  Filled 2014-07-26: qty 5

## 2014-07-26 MED ORDER — METOCLOPRAMIDE HCL 5 MG/ML IJ SOLN
10.0000 mg | Freq: Four times a day (QID) | INTRAMUSCULAR | Status: AC
  Administered 2014-07-26 – 2014-07-27 (×4): 10 mg via INTRAVENOUS
  Filled 2014-07-26 (×4): qty 2

## 2014-07-26 MED ORDER — PROMETHAZINE HCL 25 MG/ML IJ SOLN: 6.2500 mg | Freq: Four times a day (QID) | INTRAMUSCULAR | Status: DC | PRN

## 2014-07-26 MED ORDER — ALUM & MAG HYDROXIDE-SIMETH 200-200-20 MG/5ML PO SUSP
15.0000 mL | ORAL | Status: DC | PRN
  Administered 2014-07-26 (×4): 15 mL via ORAL
  Filled 2014-07-26 (×4): qty 30

## 2014-07-26 NOTE — Progress Notes (Signed)
PCA discontinued per MD orders; 77mls wasted in pyxis room with Jake Bathe, RN

## 2014-07-26 NOTE — Discharge Summary (Signed)
Physician Discharge Summary  Patient ID: [name] [name] MRN: [id number] DOB/AGE: Oct 25, 1955 59 y.o.  Admit date: 07/23/2014 Discharge date: 07/28/2014    Admission Diagnoses:  Patient Active Problem List   Diagnosis Date Noted  . 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 Primary cancer of right lower lobe of lung 04/17/2014  . Hemoptysis 04/09/2014  . Hx of tobacco use, presenting hazards to health 04/09/2014  . COPD (chronic obstructive pulmonary disease) 04/09/2014  . Cough 04/09/2014  . Migraine equivalent 11/09/2012  . Transient visual loss 01/25/2012     Discharge Diagnoses:  Invasive adenocarcinoma, right lung (T1a, N0) Patient Active Problem List   Diagnosis Date Noted  . S/P lobectomy of lung 07/23/2014  . 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 Primary cancer of right lower lobe of lung 04/17/2014  . Hemoptysis 04/09/2014  . Hx of tobacco use, presenting hazards to health 04/09/2014  . COPD (chronic obstructive pulmonary disease) 04/09/2014  . Cough 04/09/2014  . Migraine equivalent 11/09/2012  . Transient visual loss 01/25/2012    Discharged Condition: good    History of Present Illness:  [name] [name] is a 59 yo white female who was referred to [facility] with diagnosis of Stage III Adenocarcinoma of the right lower lobe.  She completed a course of chemotherapy and radiation in November/December 2015.  She underwent repeat CT scan for restaging and this revealed about a 50% reduction in the primary right lower lobe mass and lymph node adenopathy in the subcarinal region.  She was evaluated by Dr Prescott Gum on 07/03/2014 to discuss possible surgical resection.  At that time it was felt an EBUS procedure should be completed to see if lymph node involvement had improved.  This was completed on 07/09/2014 and did not show evidence of malignancy.  She was again evaluated by Dr. Prescott Gum on 07/10/2014 at which time it was felt she could proceed with lung resection.  The risks and benefits of the procedure  were explained to the patient and she was agreeable to proceed.    Hospital Course:   [name] [name] presented to [facility] on 07/23/2014.  She was taken to the operating room and underwent Right VAT with Mini Thoracotomy with RLL Lobectomy and lymph node sampling.  She tolerated the procedure, was extubated and taken to the SICU in stable condition.  Her chest tubes initially exhibited evidence of an air leak.  This has slowly resolved.  Her CXR remained stable and her posterior chest tube was removed without difficulty on POD #2.  The patient developed dehydration as a result of IV Lasix and nausea and vomiting.  She developed tachycardia and dizziness.  She was placed on IV fluids, reglan, and zofran.  Her final chest tube did not show evidence of air leak.  There was a minimal pneumothorax on the right side and her chest tube was removed without difficulty on POD #3.  CXR post removal was obtained and showed a small right pneumothorax, which has remained stable on serial x-rays.  Her dehydration symptoms have resolved.  She did develop dysuria, and urinalysis showed large leukocytes.  She was started empirically on Cipro pending culture and has symptomatically improved. She is ambulating without difficulty.  She is tolerating a regular diet and her pain is well controlled.  She is medically stable for discharge on today's date.   Significant Diagnostic Studies: Pathology  1. Lung, resection (segmental or lobe), Right lower lobe - INVASIVE ADENOCARCINOMA, SEE COMMENT. - NEGATIVE FOR LYMPH VASCULAR INVASION -  BRONCHO-VASCULAR MARGIN, NEGATIVE FOR TUMOR. - PARENCHYMAL MARGIN, NEGATIVE FOR TUMOR. - SEE TUMOR SYNOPTIC TEMPLATE BELOW. 2. Lymph node, biopsy, Hilar node - ONE LYMPH NODE, NEGATIVE FOR TUMOR (0/1). 3. Lymph node, biopsy, #7 - ONE LYMPH NODE, NEGATIVE FOR TUMOR  Treatments: surgery:   1. Video bronchoscopy. 2. Right VATS (video-assisted thoracoscopic surgery) with right  lower  lobectomy. 3. Mediastinal lymph node dissection. 4. Placement of wound On-Q analgesic irrigation system  Disposition: 01-Home or Self Care      Discharge Medications:    Medication List    TAKE these medications        ciprofloxacin 500 MG/5ML (10%) suspension  Commonly known as:  CIPRO  Take 5 mLs (500 mg total) by mouth 2 (two) times daily. X 2 more days, then d/c     metoprolol tartrate 25 MG tablet  Commonly known as:  LOPRESSOR  Take 0.5 tablets (12.5 mg total) by mouth 2 (two) times daily.     omeprazole 20 MG capsule  Commonly known as:  PRILOSEC  Take 1 capsule (20 mg total) by mouth daily as needed (stomach acid).     polyethylene glycol packet  Commonly known as:  MIRALAX / GLYCOLAX  Take 17 g by mouth daily as needed for mild constipation.     traMADol 50 MG tablet  Commonly known as:  ULTRAM  Take 1 tablet (50 mg total) by mouth every 6 (six) hours as needed for moderate pain.     verapamil 180 MG CR tablet  Commonly known as:  CALAN-SR  Take 180 mg by mouth at bedtime.     Vitamin D (Ergocalciferol) 50000 UNITS Caps capsule  Commonly known as:  DRISDOL  Take 50,000 Units by mouth every 7 (seven) days. Mondays        Follow Up: Follow-up Information    Follow up with VAN Wilber Oliphant, MD On 08/21/2014.   Specialty:  Cardiothoracic Surgery   Why:  Appointment is at 12:45   Contact information:   [place] [address] [phone]       Follow up with  [facility] On 08/21/2014.   Why:  Have a chest x-ray at 11:45 prior to your visit wtih Dr. Prescott Gum   Contact information:   [facility]       Follow up with [facility] On 08/02/2014.   Why:  Appointment is at 10:30 , For suture removal   Contact information:   [place] [phone]       Signed: Burke Keels 07/28/2014, 10:26 AM

## 2014-07-26 NOTE — Progress Notes (Signed)
Central line d/c at this time; Vaseline gauze applied to site; bedrest until 1635; will cont. To monitor.

## 2014-07-26 NOTE — Progress Notes (Signed)
IV hydralazine and IV reglan given at this time; will cont. To monitor.

## 2014-07-26 NOTE — Progress Notes (Signed)
Pt up to [facility]; pt c/o dizziness; HR up to 138 when up OOB; pt assisted back to bed; pt c/o continuous belching and nausea and states Zofran doesn't work; pt requesting Tums; will make MD aware; will cont. To monitor.

## 2014-07-26 NOTE — Progress Notes (Signed)
Radiology came for chest X-ray. Patient refused saying she did not feel good. She feels better now, already called radiology again. According to them, someone will be coming to get her shortly.

## 2014-07-26 NOTE — Progress Notes (Addendum)
      [address]       [place] [address]             [phone]      3 Days Post-Op Procedure(s) (LRB): VIDEO BRONCHOSCOPY (N/A) VIDEO ASSISTED THORACOSCOPY (VATS)/ LOBECTOMY (Right)   Subjective:  [name] states she feels terrible this morning.  She has dizziness upon standing, she is nauseated with some emesis, and palpitations.  She states all she did was go to the bathroom yesterday to urinate.    Objective: Vital signs in last 24 hours: Temp:  [97.4 F (36.3 C)-98 F (36.7 C)] 97.4 F (36.3 C) (02/19 0640) Pulse Rate:  [85-100] 100 (02/19 0640) Cardiac Rhythm:  [-] Normal sinus rhythm (02/19 0730) Resp:  [17-22] 18 (02/19 0740) BP: (127-150)/(65-98) 150/98 mmHg (02/19 0640) SpO2:  [94 %-99 %] 96 % (02/19 0740)  Intake/Output from previous day: 02/18 0701 - 02/19 0700 In: 74 [P.O.:956] Out: 890 [Urine:800; Chest Tube:90] Intake/Output this shift: Total I/O In: 0  Out: 400 [Urine:400]  General appearance: alert and appears mildly ill Heart: regular rate and rhythm and tachy Lungs: clear to auscultation bilaterally Abdomen: soft, non-tender; bowel sounds normal; no masses,  no organomegaly Wound: clean and dry  Lab Results:  Recent Labs  07/24/14 0500 07/25/14 0500  WBC 13.6* 10.4  HGB 11.2* 11.2*  HCT 34.7* 34.9*  PLT 235 213   BMET:  Recent Labs  07/24/14 0500 07/25/14 0500  NA 137 140  K 3.8 3.5  CL 109 108  CO2 25 27  GLUCOSE 157* 102*  BUN 6 8  CREATININE 0.63 0.68  CALCIUM 8.8 9.1    PT/INR: No results for input(s): LABPROT, INR in the last 72 hours. ABG    Component Value Date/Time   PHART 7.424 07/24/2014 0538   HCO3 24.7* 07/24/2014 0538   TCO2 26 07/24/2014 0538   ACIDBASEDEF 1.5 07/19/2014 1325   O2SAT 97.0 07/24/2014 0538   CBG (last 3)  No results for input(s): GLUCAP in the last 72 hours.  Assessment/Plan: S/P Procedure(s) (LRB): VIDEO BRONCHOSCOPY (N/A) VIDEO ASSISTED THORACOSCOPY (VATS)/  LOBECTOMY (Right)  1. Chest tube- no air leak this morning, 90 cc output yesterday, small apical pneumothorax on the right- will discuss chest tube removal with Dr. Prescott Gum 2. Pulm- not requiring oxygen, encouraged use of IS 3. CV- H/O HTN- patient unable to take Verapamil last night due to vomiting, will order prn hydralazine 4. Dehydration- patient received IV dose of Lasix yesterday, I think this has dried her out and has caused her tachycardia and dizziness- will increase her fluids to 50 cc per hour, encouraged patient to drink fluids as tolerated 5. GI- nausea- minimal relief with zofran, will add Reglan 6. Dispo- patient with dehydration this morning will increase IV fluids encouraged oral intake, hydralazine prn for blood pressure, reglan for additional nausea relief, will discuss chest tube removal with Dr. Prescott Gum   LOS: 3 days    BARRETT, [name] [name] 07/26/2014  Will DC chest tube Supp K+ and add metoprolol for tachycardia- early afib Add phenergan and dulc suppos for nausea, ileus  patient examined and medical record reviewed,agree with above note. VAN TRIGT III,PETER 07/26/2014

## 2014-07-26 NOTE — Discharge Instructions (Signed)
Thoracotomy, Care After Refer to this sheet in the next few weeks. These instructions provide you with information on caring for yourself after your procedure. Your health care provider may also give you more specific instructions. Your treatment has been planned according to current medical practices, but problems sometimes occur. Call your health care provider if you have any problems or questions after your procedure. WHAT TO EXPECT AFTER YOUR PROCEDURE After your procedure, it is typical to have the following sensations:  You may feel pain at the incision site.  You may be constipated from the pain medicine given and the change in your level of activity.  You may feel extremely tired. HOME CARE INSTRUCTIONS  Take over-the-counter or prescription medicines for pain, discomfort, or fever only as directed by your health care provider. It is very important to take pain relieving medicine before your pain becomes severe. You will be able to breathe and cough more comfortably if your pain is well controlled.  Take deep breaths. Deep breathing helps to keep your lungs inflated and protects against a lung infection (pneumonia).  Cough frequently. Even though coughing may cause discomfort, coughing is important to clear mucus (phlegm) and expand your lungs. Coughing helps prevent pneumonia. If it hurts to cough, hold a pillow against your chest when you cough. This may help with the discomfort.  Continue to use an incentive spirometer as directed. The use of an incentive spirometer helps to keep your lungs inflated and protects against pneumonia.  Change the bandages over your incision as needed or as directed by your health care provider.  Remove the bandages over your chest tube site as directed by your health care provider.  Resume your normal diet as directed. It is important to have adequate protein, calories, vitamins, and minerals to promote healing.  Prevent constipation.  Eat  high-fiber foods such as whole grain cereals and breads, brown rice, beans, and fresh fruits and vegetables.  Drink enough water and fluids to keep your urine clear or pale yellow. Avoid drinking beverages containing caffeine. Beverages containing caffeine can cause dehydration and harden your stool.  Talk to your health care provider about taking a stool softener or laxative.  Avoid lifting until you are instructed otherwise.  Do not drive until directed by your health care provider.  Do not drive while taking pain medicines (narcotics).  Do not bathe, swim, or use a hot tub until directed by your health care provider. You may shower instead. Gently wash the area of your incision with water and soap as directed. Do not use anything else to clean your incision except as directed by your health care provider.  Do not use any tobacco products including cigarettes, chewing tobacco, or electronic cigarettes.  Avoid secondhand smoke.  Schedule an appointment for stitch (suture) or staple removal as directed.  Schedule and attend all follow-up visits as directed by your health care provider. It is important to keep all your appointments.  Participate in pulmonary rehabilitation as directed by your health care provider.  Do not travel by airplane for 2 weeks after your chest tube is removed. SEEK MEDICAL CARE IF:  You are bleeding from your wounds.  Your heartbeat seems irregular.  You have redness, swelling, or increasing pain in the wounds.  There is pus coming from your wounds.  There is a bad smell coming from the wound or dressing.  You have a fever or chills.  You have nausea or are vomiting.  You have muscle aches. SEEK  IMMEDIATE MEDICAL CARE IF:  You have a rash.  You have difficulty breathing.  You have a reaction or side effect to medicines given.  You have persistent nausea.  You have lightheadedness or feel faint.  You have shortness of breath or chest  pain.  You have persistent pain. Document Released: 11/06/2010 Document Revised: 05/29/2013 Document Reviewed: 01/10/2013 [facility] Patient Information 2015 [place], [place]. This information is not intended to replace advice given to you by your health care provider. Make sure you discuss any questions you have with your health care provider.

## 2014-07-26 NOTE — Progress Notes (Signed)
4 mg of IV Zofran given for nausea and vomiting. Vomited earlier in the shift. On call MD notified. Per order, diet is now clear liquid.  Feels "better" now. Will continue to monitor.

## 2014-07-27 ENCOUNTER — Inpatient Hospital Stay (HOSPITAL_COMMUNITY): Payer: Managed Care, Other (non HMO)

## 2014-07-27 LAB — URINE MICROSCOPIC-ADD ON

## 2014-07-27 LAB — URINALYSIS, ROUTINE W REFLEX MICROSCOPIC
Bilirubin Urine: NEGATIVE
Glucose, UA: NEGATIVE mg/dL
Ketones, ur: NEGATIVE mg/dL
Nitrite: NEGATIVE
Protein, ur: NEGATIVE mg/dL
Specific Gravity, Urine: 1.009 (ref 1.005–1.030)
Urobilinogen, UA: 1 mg/dL (ref 0.0–1.0)
pH: 7.5 (ref 5.0–8.0)

## 2014-07-27 LAB — CBC
HCT: 35.6 % — ABNORMAL LOW (ref 36.0–46.0)
Hemoglobin: 11.7 g/dL — ABNORMAL LOW (ref 12.0–15.0)
MCH: 30.3 pg (ref 26.0–34.0)
MCHC: 32.9 g/dL (ref 30.0–36.0)
MCV: 92.2 fL (ref 78.0–100.0)
Platelets: 218 10*3/uL (ref 150–400)
RBC: 3.86 MIL/uL — ABNORMAL LOW (ref 3.87–5.11)
RDW: 14.5 % (ref 11.5–15.5)
WBC: 8.9 10*3/uL (ref 4.0–10.5)

## 2014-07-27 LAB — BASIC METABOLIC PANEL
Anion gap: 6 (ref 5–15)
BUN: <5 mg/dL — ABNORMAL LOW (ref 6–23)
CO2: 29 mmol/L (ref 19–32)
Calcium: 9.1 mg/dL (ref 8.4–10.5)
Chloride: 105 mmol/L (ref 96–112)
Creatinine, Ser: 0.64 mg/dL (ref 0.50–1.10)
GFR calc non Af Amer: >90 mL/min (ref >90)
GLUCOSE: 117 mg/dL — AB (ref 70–99)
Potassium: 3.3 mmol/L — ABNORMAL LOW (ref 3.5–5.1)
SODIUM: 140 mmol/L (ref 135–145)

## 2014-07-27 MED ORDER — CIPROFLOXACIN 500 MG/5ML (10%) PO SUSR
250.0000 mg | Freq: Two times a day (BID) | ORAL | Status: DC
  Administered 2014-07-27 – 2014-07-28 (×2): 250 mg via ORAL
  Filled 2014-07-27 (×4): qty 2.5

## 2014-07-27 MED ORDER — CIPROFLOXACIN 500 MG/5ML (10%) PO SUSR: 500.0000 mg | Freq: Every day | ORAL | Status: DC

## 2014-07-27 NOTE — Progress Notes (Signed)
Urinalysis results called to Dr. Servando Snare. Order for antibiotic received. Pharmacy made an adjustment.

## 2014-07-27 NOTE — Progress Notes (Signed)
Pt ambulated in hallway 350 ft independently on room air and tolerated activity well.

## 2014-07-27 NOTE — Progress Notes (Signed)
Pt ambulated in hallway 350 ft independently on room air and tolerated activity well. Will continue to monitor.

## 2014-07-27 NOTE — Progress Notes (Signed)
Pt ambulated 350 ft with rolling walker on room air and tolerated activity well. Pt encouraged to walk two more times today. Will continue to monitor.

## 2014-07-27 NOTE — Progress Notes (Addendum)
       [address]       [place] [address]             [phone]          4 Days Post-Op Procedure(s) (LRB): VIDEO BRONCHOSCOPY (N/A) VIDEO ASSISTED THORACOSCOPY (VATS)/ LOBECTOMY (Right)  Subjective: Feels much better today.  No more dizziness.  Tolerating liquids, had 2 BMs.   Objective: Vital signs in last 24 hours: Patient Vitals for the past 24 hrs:  BP Temp Temp src Pulse Resp SpO2  07/26/14 1943 117/68 mmHg 97.5 F (36.4 C) Oral 98 18 97 %  07/26/14 1319 121/77 mmHg 97.6 F (36.4 C) Oral 99 18 97 %   Current Weight  07/24/14 157 lb 6.5 oz (71.4 kg)     Intake/Output from previous day: 02/19 0701 - 02/20 0700 In: 858.3 [P.O.:340; I.V.:518.3] Out: 800 [Urine:800]    PHYSICAL EXAM:  Heart: RRR Lungs: Clear Wound: Clean and dry    Lab Results: CBC: Recent Labs  07/25/14 0500 07/27/14 0417  WBC 10.4 8.9  HGB 11.2* 11.7*  HCT 34.9* 35.6*  PLT 213 218   BMET:  Recent Labs  07/25/14 0500 07/27/14 0417  NA 140 140  K 3.5 3.3*  CL 108 105  CO2 27 29  GLUCOSE 102* 117*  BUN 8 <5*  CREATININE 0.68 0.64  CALCIUM 9.1 9.1    PT/INR: No results for input(s): LABPROT, INR in the last 72 hours.  CXR: FINDINGS: Cardiomediastinal silhouette unchanged in size and contour. Right heart border partially obscured by overlying lung/pleural disease.  Interval removal of the lateral right thoracostomy tube. There is a trace pneumothorax at the apex.  Persisting opacity at the right base with obscuration of the hemidiaphragm.  Aeration on the left is maintained with linear opacities at the base.  IMPRESSION: Interval removal of right-sided lateral thoracostomy tube, with tiny pneumothorax at the apex.  Small persistent pleural fluid at the right base, with likely associated atelectasis.   Assessment/Plan: S/P Procedure(s) (LRB): VIDEO BRONCHOSCOPY (N/A) VIDEO ASSISTED THORACOSCOPY (VATS)/ LOBECTOMY (Right)  CXR stable  following CT removal.  Continue pulm toilet, ambulation. Advance diet as tolerated and saline lock IV. Possibly home in am if remains stable.   LOS: 4 days    COLLINS,GINA H 07/27/2014  I have seen and examined [name] [name] and agree with the above assessment  and plan.  Grace Isaac MD Beeper [phone] Office [phone] 07/27/2014 1:39 PM

## 2014-07-28 ENCOUNTER — Inpatient Hospital Stay (HOSPITAL_COMMUNITY): Payer: Managed Care, Other (non HMO)

## 2014-07-28 MED ORDER — METOPROLOL TARTRATE 25 MG PO TABS: 12.5000 mg | ORAL_TABLET | Freq: Two times a day (BID) | ORAL | Status: DC

## 2014-07-28 MED ORDER — TRAMADOL HCL 50 MG PO TABS: 50.0000 mg | ORAL_TABLET | Freq: Four times a day (QID) | ORAL | Status: DC | PRN

## 2014-07-28 MED ORDER — CIPROFLOXACIN 500 MG/5ML (10%) PO SUSR: 500.0000 mg | Freq: Two times a day (BID) | ORAL | Status: DC

## 2014-07-28 MED ORDER — OMEPRAZOLE 20 MG PO CPDR: 20.0000 mg | DELAYED_RELEASE_CAPSULE | Freq: Every day | ORAL | Status: DC | PRN

## 2014-07-28 NOTE — Progress Notes (Addendum)
       [address]       ,[place] [address]             [phone]          5 Days Post-Op Procedure(s) (LRB): VIDEO BRONCHOSCOPY (N/A) VIDEO ASSISTED THORACOSCOPY (VATS)/ LOBECTOMY (Right)  Subjective: Feels better today.  Had some dysuria yesterday and UA revealed many WBCs.  Started on Cipro and symptoms resolved. No other complaints.  Tolerating diet, bowels working.   Objective: Vital signs in last 24 hours: Patient Vitals for the past 24 hrs:  BP Temp Temp src Pulse Resp SpO2  07/28/14 0451 116/65 mmHg 98.2 F (36.8 C) Oral 77 16 97 %  07/27/14 1922 (!) 117/57 mmHg 98 F (36.7 C) Oral 94 18 99 %  07/27/14 1330 (!) 99/49 mmHg 98.5 F (36.9 C) Oral 96 17 99 %  07/27/14 1024 115/78 mmHg - - 86 - -  07/27/14 1023 115/73 mmHg - - 86 - -   Current Weight  07/24/14 157 lb 6.5 oz (71.4 kg)     Intake/Output from previous day: 02/20 0701 - 02/21 0700 In: 480 [P.O.:480] Out: 100 [Urine:100]    PHYSICAL EXAM:  Heart: RRR Lungs: Clear Wound:Clean and dry    Lab Results: CBC: Recent Labs  07/27/14 0417  WBC 8.9  HGB 11.7*  HCT 35.6*  PLT 218   BMET:  Recent Labs  07/27/14 0417  NA 140  K 3.3*  CL 105  CO2 29  GLUCOSE 117*  BUN <5*  CREATININE 0.64  CALCIUM 9.1    PT/INR: No results for input(s): LABPROT, INR in the last 72 hours.  CXR: FINDINGS: Upper normal heart size.  Normal mediastinal contours and pulmonary vascularity.  Persistent RIGHT pleural effusion and basilar atelectasis.  Subsegmental atelectasis at LEFT base.  Underlying emphysematous changes.  Tiny RIGHT apex pneumothorax little changed.  No acute osseous findings.  IMPRESSION: Persistent RIGHT hydropneumothorax with basilar atelectasis unchanged.  Subsegmental atelectasis at LEFT base  Assessment/Plan: S/P Procedure(s) (LRB): VIDEO BRONCHOSCOPY (N/A) VIDEO ASSISTED THORACOSCOPY (VATS)/ LOBECTOMY (Right) Stable, doing well. GU- u/a with  large leukocytes, nitrite negative.  Has received 2 doses Cipro empirically with symptomatic improvement. Culture pending.    Plan d/c home later today on 2 more days of Cipro and office will follow up culture.    LOS: 5 days    Demetri Goshert H 07/28/2014

## 2014-07-28 NOTE — Progress Notes (Signed)
Pt discharged per MD order and protocol. Discharge instructions reviewed with pt. All questions answered. Pt aware of all follow up appts. And aware prescriptions called into pharmacy. Pt was alert, oriented, and in no distress.  Pt is leaving via wheel chair accompanied by family.

## 2014-07-28 NOTE — Progress Notes (Signed)
Pt c/o lower abdominal pain and pressure. Pt also c/o burning with urination. MD paged and aware. Orders given for UA and culture.

## 2014-07-29 ENCOUNTER — Telehealth: Payer: Self-pay | Admitting: Internal Medicine

## 2014-07-29 NOTE — Telephone Encounter (Signed)
returned call and s.w. pt and cx appt per pt request

## 2014-07-30 ENCOUNTER — Other Ambulatory Visit: Payer: Self-pay | Admitting: *Deleted

## 2014-07-30 ENCOUNTER — Other Ambulatory Visit: Payer: Managed Care, Other (non HMO)

## 2014-07-30 ENCOUNTER — Ambulatory Visit: Payer: Managed Care, Other (non HMO) | Admitting: Internal Medicine

## 2014-07-30 DIAGNOSIS — N390 Urinary tract infection, site not specified: Secondary | ICD-10-CM

## 2014-07-30 DIAGNOSIS — B9620 Unspecified Escherichia coli [E. coli] as the cause of diseases classified elsewhere: Secondary | ICD-10-CM

## 2014-07-30 LAB — URINE CULTURE: Colony Count: >=100000

## 2014-07-30 MED ORDER — CIPROFLOXACIN 500 MG/5ML (10%) PO SUSR: 500.0000 mg | Freq: Two times a day (BID) | ORAL | Status: AC

## 2014-08-02 ENCOUNTER — Other Ambulatory Visit: Payer: Self-pay | Admitting: *Deleted

## 2014-08-02 ENCOUNTER — Ambulatory Visit: Payer: Self-pay | Admitting: *Deleted

## 2014-08-02 ENCOUNTER — Ambulatory Visit
Admission: RE | Admit: 2014-08-02 | Discharge: 2014-08-02 | Disposition: A | Discharge status: Home / Self Care | Payer: Managed Care, Other (non HMO) | Source: Ambulatory Visit | Attending: Cardiothoracic Surgery | Admitting: Cardiothoracic Surgery

## 2014-08-02 DIAGNOSIS — R0602 Shortness of breath: Secondary | ICD-10-CM

## 2014-08-02 DIAGNOSIS — Z4802 Encounter for removal of sutures: Secondary | ICD-10-CM

## 2014-08-02 DIAGNOSIS — C3431 Malignant neoplasm of lower lobe, right bronchus or lung: Secondary | ICD-10-CM

## 2014-08-02 DIAGNOSIS — Z9889 Other specified postprocedural states: Secondary | ICD-10-CM

## 2014-08-02 DIAGNOSIS — J90 Pleural effusion, not elsewhere classified: Secondary | ICD-10-CM

## 2014-08-02 MED ORDER — POTASSIUM CHLORIDE CRYS ER 10 MEQ PO TBCR
10.0000 meq | EXTENDED_RELEASE_TABLET | Freq: Every day | ORAL | Status: DC
Start: 2014-08-02 — End: 2014-08-08

## 2014-08-02 MED ORDER — FUROSEMIDE 20 MG PO TABS
20.0000 mg | ORAL_TABLET | Freq: Every day | ORAL | Status: DC
Start: 2014-08-02 — End: 2014-08-08

## 2014-08-02 NOTE — Progress Notes (Signed)
[name] [name] returns s/p RLLobectomy for suture removal of two previous chest tube sites. This was easily done.  These sites as well as the right thoracotomy incision are all very well healed. We discussed her weight and exercise restrictions. She was also concerned with the usual numbness that occurs with this surgery and was reassured.  She is having shortness of breath with exertion.  There is diminished breath sounds in the right lower field. She was sent for a chest xray which revealed a moderate right pleural effusion. I consulted Lars Pinks, P.A. She felt her xray had actually improved since discharge.  She ordered Lasix 40/K 10 x 5 days and if no improvement, call and be seen next week, go to the ER if necessary.  I reviewed the chest xray with [name] [name] and her husband, the treatment plan. She was very concerned regarding the LASIX because she had had an IV dose in the hospital and it had caused "dehydration, increased pulse and need for Metoprolol".  I compromised with her to take Lasix 20/K 10 and she agreed.

## 2014-08-04 ENCOUNTER — Other Ambulatory Visit: Payer: Self-pay | Admitting: Internal Medicine

## 2014-08-05 ENCOUNTER — Telehealth: Payer: Self-pay | Admitting: Internal Medicine

## 2014-08-05 NOTE — Telephone Encounter (Signed)
s.w. pt and advised on March appt...ok and aware °

## 2014-08-08 ENCOUNTER — Other Ambulatory Visit: Payer: Self-pay | Admitting: Cardiothoracic Surgery

## 2014-08-08 ENCOUNTER — Other Ambulatory Visit: Payer: Self-pay | Admitting: *Deleted

## 2014-08-08 ENCOUNTER — Encounter: Payer: Self-pay | Admitting: Cardiothoracic Surgery

## 2014-08-08 ENCOUNTER — Ambulatory Visit (INDEPENDENT_AMBULATORY_CARE_PROVIDER_SITE_OTHER): Payer: Self-pay | Admitting: Cardiothoracic Surgery

## 2014-08-08 ENCOUNTER — Other Ambulatory Visit: Payer: Self-pay

## 2014-08-08 ENCOUNTER — Ambulatory Visit
Admission: RE | Admit: 2014-08-08 | Discharge: 2014-08-08 | Disposition: A | Discharge status: Home / Self Care | Payer: Managed Care, Other (non HMO) | Source: Ambulatory Visit | Attending: Cardiothoracic Surgery | Admitting: Cardiothoracic Surgery

## 2014-08-08 VITALS — BP 137/83 | HR 120 | Resp 20 | Ht 65.75 in | Wt 153.0 lb

## 2014-08-08 DIAGNOSIS — R208 Other disturbances of skin sensation: Secondary | ICD-10-CM

## 2014-08-08 DIAGNOSIS — Z902 Acquired absence of lung [part of]: Secondary | ICD-10-CM

## 2014-08-08 DIAGNOSIS — C3491 Malignant neoplasm of unspecified part of right bronchus or lung: Secondary | ICD-10-CM

## 2014-08-08 DIAGNOSIS — R0602 Shortness of breath: Secondary | ICD-10-CM

## 2014-08-08 DIAGNOSIS — J90 Pleural effusion, not elsewhere classified: Secondary | ICD-10-CM

## 2014-08-08 DIAGNOSIS — Z9889 Other specified postprocedural states: Secondary | ICD-10-CM

## 2014-08-08 DIAGNOSIS — C3431 Malignant neoplasm of lower lobe, right bronchus or lung: Secondary | ICD-10-CM

## 2014-08-08 LAB — URINALYSIS W MICROSCOPIC + REFLEX CULTURE
Bacteria, UA: NONE SEEN
Bilirubin Urine: NEGATIVE
Crystals: NONE SEEN
Glucose, UA: NEGATIVE mg/dL
Hgb urine dipstick: NEGATIVE
Leukocytes, UA: NEGATIVE
Nitrite: NEGATIVE
Protein, ur: 30 mg/dL — AB
Specific Gravity, Urine: 1.026 (ref 1.005–1.030)
Urobilinogen, UA: 0.2 mg/dL (ref 0.0–1.0)
pH: 6.5 (ref 5.0–8.0)

## 2014-08-08 LAB — CBC WITH DIFFERENTIAL/PLATELET
Basophils Absolute: 0 10*3/uL (ref 0.0–0.1)
Eosinophils Absolute: 0 10*3/uL (ref 0.0–0.7)
HCT: 38.8 % (ref 36.0–46.0)
Hemoglobin: 12.9 g/dL (ref 12.0–15.0)
Lymphocytes Relative: 14 % (ref 12–46)
Lymphs Abs: 1.2 10*3/uL (ref 0.7–4.0)
MCH: 31 pg (ref 26.0–34.0)
MCHC: 33.4 g/dL (ref 30.0–36.0)
MCV: 93.2 fL (ref 78.0–100.0)
Monocytes Absolute: 0.4 10*3/uL (ref 0.1–1.0)
Monocytes Relative: 4 % (ref 3–12)
Neutro Abs: 7.3 10*3/uL (ref 1.7–7.7)
Neutrophils Relative %: 82 % — ABNORMAL HIGH (ref 43–77)
Platelets: 316 10*3/uL (ref 150–400)
RBC: 4.16 MIL/uL (ref 3.87–5.11)
RDW: 13.8 % (ref 11.5–15.5)
WBC: 8.9 10*3/uL (ref 4.0–10.5)

## 2014-08-08 LAB — BASIC METABOLIC PANEL WITH GFR
BUN: 10 mg/dL (ref 6–23)
CO2: 24 mEq/L (ref 19–32)
Calcium: 9.8 mg/dL (ref 8.4–10.5)
Chloride: 104 mEq/L (ref 96–112)
Creat: 0.7 mg/dL (ref 0.50–1.10)
GFR, Est African American: >89 mL/min
GFR, Est Non African American: >89 mL/min
Glucose, Bld: 103 mg/dL — ABNORMAL HIGH (ref 70–99)
Potassium: 3.9 mEq/L (ref 3.5–5.3)
Sodium: 142 mEq/L (ref 135–145)

## 2014-08-08 NOTE — Progress Notes (Signed)
PCP is Cloyd Stagers, MD Referring Provider is Lora Paula, MD  Chief Complaint  Patient presents with  . Routine Post Op    c/o increased shortness of breath. 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 Lymph node.. She has history of SVT and developed atrial tachycardia postoperatively. She was discharged home on metoprolol 12.5 twice a day in addition to her usual verapamil SR 180 mg daily. Prior to discharge she had a small right pleural effusion. Her shortness of breath as exertional. She's had a dry cough. She denies fever. Her appetite and swallowing is good. She is expected postoperative thoracotomy pain. She completed a course of Cipro for a gram-negative UTI. She does not walk much since being discharged from the hospital. Her surgery was just  over 2 weeks ago.   Past Medical History  Diagnosis Date  . Visual disturbance     Episodic  . History of migraine   . Thyroid nodule     benign by biopsy  . Varicose veins     left leg  . Vitamin D deficiency   . Shortness of breath dyspnea     with exertion  . Wears glasses   . COPD (chronic obstructive pulmonary disease)   . GERD (gastroesophageal reflux disease)   . Cancer     lung  . PVC (premature ventricular contraction)   . Anxiety     husband also cancer pt  . Complication of anesthesia     bronchospasms post-op (pulmonary did consult but pt was discharged later that day)  With last surg. she was given albuterol inhaler & had steroid with surg.    [name] Kitchen PONV (postoperative nausea and vomiting)   . Hypertension     h/o PVC- asymptomatic   . Congenital renal atrophy     left kidney, one spontaneous stone  passed   . 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 Degenerative arthritis     spine, hands & knees     Past Surgical History  Procedure Laterality Date  . Tonsillectomy    . Cesarean section    . Carpal tunnel release Left   . Colonoscopy w/ biopsies and polypectomy    . Dilation and curettage of uterus    . Video bronchoscopy with endobronchial ultrasound Right 04/17/2014    Procedure: VIDEO BRONCHOSCOPY WITH ENDOBRONCHIAL ULTRASOUND;  Surgeon: Collene Gobble, MD;  Location: [facility];  Service: Thoracic;  Laterality: Right;  . Varicose vein surgery      left  . Video bronchoscopy with endobronchial ultrasound N/A 07/08/2014    Procedure: VIDEO BRONCHOSCOPY WITH ENDOBRONCHIAL ULTRASOUND;  Surgeon: Ivin Poot, MD;  Location: [facility];  Service: Thoracic;  Laterality: N/A;  . Video bronchoscopy N/A 07/23/2014    Procedure: VIDEO BRONCHOSCOPY;  Surgeon: Ivin Poot, MD;  Location: [facility];  Service: Thoracic;  Laterality: N/A;  . Video assisted thoracoscopy (vats)/ lobectomy Right 07/23/2014    Procedure: VIDEO ASSISTED THORACOSCOPY (VATS)/ LOBECTOMY;  Surgeon: Ivin Poot, MD;  Location: [facility];  Service: Thoracic;  Laterality: Right;    Family History  Problem Relation Age of Onset  .  Cancer Mother     cancer of the small intestines  . Hypertension Father   . Atrial fibrillation Father   . Diverticulitis Father   . Hypertension Sister   . Diverticulitis Brother   . Hypertension Sister   . Headache Sister     Social History History  Substance Use Topics  . Smoking status: Former Smoker -- 0.50 packs/day for 40 years    Types: Cigarettes    Quit date: 03/17/2013  . Smokeless tobacco: Never Used  . Alcohol Use: 0.0 oz/week    0 Standard drinks or equivalent per week     Comment: " rare"    Current Outpatient Prescriptions  Medication Sig Dispense Refill  . metoprolol tartrate (LOPRESSOR) 25 MG  tablet Take 0.5 tablets (12.5 mg total) by mouth 2 (two) times daily. 30 tablet 1  . omeprazole (PRILOSEC) 20 MG capsule Take 1 capsule (20 mg total) by mouth daily as needed (stomach acid). 60 capsule 3  . polyethylene glycol (MIRALAX / GLYCOLAX) packet Take 17 g by mouth daily as needed for mild constipation.    . verapamil (CALAN-SR) 180 MG CR tablet Take 180 mg by mouth at bedtime.    . Vitamin D, Ergocalciferol, (DRISDOL) 50000 UNITS CAPS capsule Take 50,000 Units by mouth every 7 (seven) days. Mondays  1   No current facility-administered medications for this visit.    Allergies  Allergen Reactions  . Advil [Ibuprofen] Other (See Comments)    Causes hematuria advil syrup causes itching  . Hydrocodone-Guaifenesin Itching  . Sulfa Antibiotics Hives and Swelling  . Temazepam Other (See Comments)    dizziness  . Plasticized Base [Plastibase] Rash    tape  . Zofran [Ondansetron Hcl] Palpitations    Review of Systems   No fever Improved appetite Improving overall strength  BP 137/83 mmHg  Pulse 120  Resp 20  Ht 5' 5.75" (1.67 m)  Wt 153 lb (69.4 kg)  BMI 24.88 kg/m2  SpO2 95% Physical Exam Alert and comfortable with unlabored breathing She does not appear anemic, conjunctiva pink Lungs clear with diminished breath sounds at right base Right VATS incision well-healed Heart rate increased 112 per minute with few PVCs--rhythm strip shows probable sinus tachycardia No pedal edema Abdomen soft  Diagnostic Tests: Chest x-ray today shows similar small right pleural effusion-IM loss at right base, unchanged from her predischarge x-ray. No significant pneumothorax.  Impression: Patient did not take her beta blocker dose this morning. She completed a course of Lasix and is probably potassium depleted. Both factors probably contributed to her increased heart rate and shortness of breath. Room air saturation is 95%.  Plan:take her dose of metoprolol and continue that as  scheduled, increased potassium intake with bananas twice a day. No more Lasix at this time. Followup with x-ray in 8 days. We'll check her hemoglobin electrolytes and urine culture( to make sure her urine tract infection is cleared) today.

## 2014-08-09 ENCOUNTER — Other Ambulatory Visit: Payer: Self-pay | Admitting: *Deleted

## 2014-08-12 ENCOUNTER — Other Ambulatory Visit: Payer: Self-pay | Admitting: Cardiothoracic Surgery

## 2014-08-12 DIAGNOSIS — Z902 Acquired absence of lung [part of]: Secondary | ICD-10-CM

## 2014-08-16 ENCOUNTER — Other Ambulatory Visit: Payer: Self-pay

## 2014-08-16 ENCOUNTER — Ambulatory Visit: Payer: Managed Care, Other (non HMO) | Admitting: Cardiothoracic Surgery

## 2014-08-16 ENCOUNTER — Other Ambulatory Visit: Payer: Self-pay | Admitting: Cardiothoracic Surgery

## 2014-08-16 DIAGNOSIS — Z902 Acquired absence of lung [part of]: Secondary | ICD-10-CM

## 2014-08-21 ENCOUNTER — Ambulatory Visit (INDEPENDENT_AMBULATORY_CARE_PROVIDER_SITE_OTHER): Payer: Self-pay | Admitting: Cardiothoracic Surgery

## 2014-08-21 ENCOUNTER — Ambulatory Visit
Admission: RE | Admit: 2014-08-21 | Discharge: 2014-08-21 | Disposition: A | Discharge status: Home / Self Care | Payer: Managed Care, Other (non HMO) | Source: Ambulatory Visit | Attending: Cardiothoracic Surgery | Admitting: Cardiothoracic Surgery

## 2014-08-21 ENCOUNTER — Encounter: Payer: Self-pay | Admitting: Cardiothoracic Surgery

## 2014-08-21 ENCOUNTER — Ambulatory Visit: Payer: Managed Care, Other (non HMO) | Admitting: Cardiothoracic Surgery

## 2014-08-21 ENCOUNTER — Other Ambulatory Visit: Payer: Self-pay | Admitting: *Deleted

## 2014-08-21 VITALS — BP 138/80 | HR 76 | Resp 20 | Ht 65.75 in | Wt 153.0 lb

## 2014-08-21 DIAGNOSIS — Z9889 Other specified postprocedural states: Secondary | ICD-10-CM

## 2014-08-21 DIAGNOSIS — C3431 Malignant neoplasm of lower lobe, right bronchus or lung: Secondary | ICD-10-CM

## 2014-08-21 DIAGNOSIS — Z902 Acquired absence of lung [part of]: Secondary | ICD-10-CM

## 2014-08-21 DIAGNOSIS — R0602 Shortness of breath: Secondary | ICD-10-CM

## 2014-08-21 DIAGNOSIS — C3430 Malignant neoplasm of lower lobe, unspecified bronchus or lung: Secondary | ICD-10-CM

## 2014-08-21 NOTE — Progress Notes (Signed)
PCP is Shamleffer, Lucienne Capers, MD Referring Provider is Tyler Pita, MD  Chief Complaint  Patient presents with  . Routine Post Op    F/u from surgery with CXR, s/p RT VATS, right lower lobectomy. 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 It was initially stage IIIa a downsized following radiation and chemotherapy to a stage I. She has 2 main problems. She is muscular pain in her posterior upper right back from the incision. Tramadol was not relieve this pain. She also has dyspnea on exertion. She denies fever productive cough. Incision is well-healed. Chest x-ray today shows expected reduction in right lung volume with a elevated right hemidiaphragm and a probable small pleural effusion which filled the post resection space.  At the last visit the patient had a normal hemoglobin 12.7 g, normal white count, normal UA and potassium 3.9. The patient's heart rate was elevated at 120 and was probably SVT. She has a history of SVT and takes chronic verapamil. She had postop SVT and metoprolol was added to her verapamil. Past Medical History  Diagnosis Date  . Visual disturbance     Episodic  . History of migraine   . Thyroid nodule     benign by biopsy  . Varicose veins     left leg  . Vitamin D deficiency   . Shortness of breath dyspnea     with exertion  . Wears glasses   . COPD (chronic obstructive pulmonary disease)   . GERD (gastroesophageal reflux disease)   . Cancer     lung  . PVC (premature ventricular contraction)   . Anxiety     husband also cancer pt  . Complication of anesthesia     bronchospasms post-op (pulmonary did consult but pt was discharged later that day)  With last surg. she was given albuterol inhaler & had steroid with surg.    [name] Kitchen PONV (postoperative nausea and vomiting)   . Hypertension     h/o PVC- asymptomatic   . Congenital renal atrophy     left  kidney, one spontaneous stone passed   . 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 Degenerative arthritis     spine, hands & knees     Past Surgical History  Procedure Laterality Date  . Tonsillectomy    . Cesarean section    . Carpal tunnel release Left   . Colonoscopy w/ biopsies and polypectomy    . Dilation and curettage of uterus    . Video bronchoscopy with endobronchial ultrasound Right 04/17/2014    Procedure: VIDEO BRONCHOSCOPY WITH ENDOBRONCHIAL ULTRASOUND;  Surgeon: Collene Gobble, MD;  Location: [facility];  Service: Thoracic;  Laterality: Right;  . Varicose vein surgery      left  . Video bronchoscopy with endobronchial ultrasound N/A 07/08/2014    Procedure: VIDEO BRONCHOSCOPY WITH ENDOBRONCHIAL ULTRASOUND;  Surgeon: Ivin Poot, MD;  Location: [facility];  Service: Thoracic;  Laterality: N/A;  . Video bronchoscopy N/A 07/23/2014    Procedure: VIDEO BRONCHOSCOPY;  Surgeon: Ivin Poot, MD;  Location: [facility];  Service: Thoracic;  Laterality: N/A;  . Video assisted thoracoscopy (vats)/ lobectomy Right 07/23/2014    Procedure: VIDEO ASSISTED THORACOSCOPY (VATS)/ LOBECTOMY;  Surgeon: Ivin Poot, MD;  Location: [facility];  Service: Thoracic;  Laterality: Right;  Family History  Problem Relation Age of Onset  . Cancer Mother     cancer of the small intestines  . Hypertension Father   . Atrial fibrillation Father   . Diverticulitis Father   . Hypertension Sister   . Diverticulitis Brother   . Hypertension Sister   . Headache Sister     Social History History  Substance Use Topics  . Smoking status: Former Smoker -- 0.50 packs/day for 40 years    Types: Cigarettes    Quit date: 03/17/2013  . Smokeless tobacco: Never Used  . Alcohol Use: 0.0 oz/week    0 Standard drinks or equivalent per week     Comment: " rare"    Current Outpatient Prescriptions  Medication Sig Dispense Refill  .  docusate sodium (COLACE) 100 MG capsule Take 100 mg by mouth daily.    . metoprolol tartrate (LOPRESSOR) 25 MG tablet Take 0.5 tablets (12.5 mg total) by mouth 2 (two) times daily. 30 tablet 1  . omeprazole (PRILOSEC) 20 MG capsule Take 1 capsule (20 mg total) by mouth daily as needed (stomach acid). 60 capsule 3  . polyethylene glycol (MIRALAX / GLYCOLAX) packet Take 17 g by mouth daily as needed for mild constipation.    . verapamil (CALAN-SR) 180 MG CR tablet Take 180 mg by mouth at bedtime.    . Vitamin D, Ergocalciferol, (DRISDOL) 50000 UNITS CAPS capsule Take 50,000 Units by mouth every 7 (seven) days. Mondays  1  . traMADol (ULTRAM) 50 MG tablet Take 50 mg by mouth every 6 (six) hours as needed.      No current facility-administered medications for this visit.    Allergies  Allergen Reactions  . Advil [Ibuprofen] Other (See Comments)    Causes hematuria advil syrup causes itching  . Hydrocodone-Guaifenesin Itching  . Sulfa Antibiotics Hives and Swelling  . Temazepam Other (See Comments)    dizziness  . Plasticized Base [Plastibase] Rash    tape  . Zofran [Ondansetron Hcl] Palpitations    Review of Systems  Other than right posterior shoulder pain and dyspnea with ambulation she has no significant complaints.  BP 138/80 mmHg  Pulse 76  Resp 20  Ht 5' 5.75" (1.67 m)  Wt 153 lb (69.4 kg)  BMI 24.88 kg/m2  SpO2 97% Physical Exam Alert and comfortable Oxygen saturation 98% arrest, after walking around the office 300 feet oxygen saturation is 98% Breath sounds clear but diminished at right base right VATS incision well-healed Heart rate regular without gallop or murmur No pedal edema  Diagnostic Tests: Chest x-ray shows postoperative changes, reduction right lung volume status post right lower lobectomy, small pleural effusion  Impression: We will increase her pain medication by adding Vicodin and also when necessary Flexeril to help her rest especially at night  We  will wean her off the beta blocker-reducing her dose in half to a 12.5 mg metoprolol by mouth each bedtime for 1 week and and then off  We will check her lower extremity duplex ultrasound to rule out DVT as postop PE could be part of her shortness of breath  Plan:return in 3-4 weeks with CT scan of chest with IV contrast.   Len Childs, MD [facility] [phone]

## 2014-08-22 ENCOUNTER — Ambulatory Visit
Admission: RE | Admit: 2014-08-22 | Discharge: 2014-08-22 | Disposition: A | Discharge status: Home / Self Care | Payer: Managed Care, Other (non HMO) | Source: Ambulatory Visit | Attending: Cardiothoracic Surgery | Admitting: Cardiothoracic Surgery

## 2014-08-22 DIAGNOSIS — R0602 Shortness of breath: Secondary | ICD-10-CM

## 2014-08-22 DIAGNOSIS — C3430 Malignant neoplasm of lower lobe, unspecified bronchus or lung: Secondary | ICD-10-CM

## 2014-08-23 ENCOUNTER — Telehealth: Payer: Self-pay | Admitting: *Deleted

## 2014-08-23 NOTE — Telephone Encounter (Signed)
[name] [name] called to say she thinks that next week is too soon to be seen by Dr. Julien Nordmann.  She had a right lobectomy on 07/23/14, and has been told she should be 12 weeks post-op before starting any kind of treatment if indicated.  She had labs last by at Dr. Ewell Poe office.  Please let patient know if her appointment should be moved out a little further.

## 2014-08-25 NOTE — Telephone Encounter (Signed)
She needs to come for the appointment to discuss her options.

## 2014-08-26 NOTE — Telephone Encounter (Signed)
Pt notified. I told her she does not need labs first

## 2014-08-28 ENCOUNTER — Telehealth: Payer: Self-pay | Admitting: Internal Medicine

## 2014-08-28 ENCOUNTER — Encounter: Payer: Self-pay | Admitting: *Deleted

## 2014-08-28 ENCOUNTER — Other Ambulatory Visit: Payer: Self-pay | Admitting: *Deleted

## 2014-08-28 ENCOUNTER — Ambulatory Visit (HOSPITAL_BASED_OUTPATIENT_CLINIC_OR_DEPARTMENT_OTHER): Payer: Managed Care, Other (non HMO) | Admitting: Internal Medicine

## 2014-08-28 ENCOUNTER — Encounter: Payer: Self-pay | Admitting: Internal Medicine

## 2014-08-28 ENCOUNTER — Other Ambulatory Visit (HOSPITAL_BASED_OUTPATIENT_CLINIC_OR_DEPARTMENT_OTHER): Payer: Managed Care, Other (non HMO)

## 2014-08-28 VITALS — BP 143/62 | HR 89 | Temp 97.9°F | Wt 155.2 lb

## 2014-08-28 DIAGNOSIS — C3431 Malignant neoplasm of lower lobe, right bronchus or lung: Secondary | ICD-10-CM

## 2014-08-28 LAB — COMPREHENSIVE METABOLIC PANEL (CC13)
ALBUMIN: 3.6 g/dL (ref 3.5–5.0)
ALK PHOS: 118 U/L (ref 40–150)
ALT: 11 U/L (ref 0–55)
AST: 16 U/L (ref 5–34)
Anion Gap: 9 mEq/L (ref 3–11)
BUN: 9.9 mg/dL (ref 7.0–26.0)
CALCIUM: 9.6 mg/dL (ref 8.4–10.4)
CO2: 27 mEq/L (ref 22–29)
Chloride: 106 mEq/L (ref 98–109)
Creatinine: 0.7 mg/dL (ref 0.6–1.1)
EGFR: >90 mL/min/{1.73_m2} (ref >90)
Glucose: 89 mg/dl (ref 70–140)
Potassium: 4.3 mEq/L (ref 3.5–5.1)
SODIUM: 142 meq/L (ref 136–145)
TOTAL PROTEIN: 6.8 g/dL (ref 6.4–8.3)
Total Bilirubin: 0.41 mg/dL (ref 0.20–1.20)

## 2014-08-28 LAB — CBC WITH DIFFERENTIAL/PLATELET
BASO%: 0.8 % (ref 0.0–2.0)
Basophils Absolute: 0 10*3/uL (ref 0.0–0.1)
EOS ABS: 0.2 10*3/uL (ref 0.0–0.5)
EOS%: 4.2 % (ref 0.0–7.0)
HEMATOCRIT: 40.3 % (ref 34.8–46.6)
HGB: 12.9 g/dL (ref 11.6–15.9)
LYMPH%: 15 % (ref 14.0–49.7)
MCH: 29.5 pg (ref 25.1–34.0)
MCHC: 32 g/dL (ref 31.5–36.0)
MCV: 92.1 fL (ref 79.5–101.0)
MONO#: 0.5 10*3/uL (ref 0.1–0.9)
MONO%: 9.1 % (ref 0.0–14.0)
NEUT%: 70.9 % (ref 38.4–76.8)
NEUTROS ABS: 4.2 10*3/uL (ref 1.5–6.5)
Platelets: 230 10*3/uL (ref 145–400)
RBC: 4.37 10*6/uL (ref 3.70–5.45)
RDW: 13.2 % (ref 11.2–14.5)
WBC: 5.9 10*3/uL (ref 3.9–10.3)
lymph#: 0.9 10*3/uL (ref 0.9–3.3)

## 2014-08-28 LAB — RESEARCH LABS

## 2014-08-28 NOTE — Telephone Encounter (Signed)
gave and printed appt sched and avs for pt for April and July

## 2014-08-28 NOTE — Progress Notes (Signed)
[place] [phone]   [phone]) [phone]  OFFICE PROGRESS NOTE  Shamleffer, Lucienne Capers, MD [address]  [address] [place] [address]  DIAGNOSIS: Primary cancer of right lower lobe of lung  Staging form: Lung, AJCC 7th Edition  Clinical: Stage IIIA (T2a, N2, M0) - Unsigned  Staging comments: Adenocarcinoma  Foundation 1 molecular studies: positive for FVCBS496P, KRASG13C, [id number]*, [id number]*11, GATA6 amplification and [id number] Negative for BRAF, ALK, MET, RET and ERBB2  PRIOR THERAPY:  1) Concurrent chemoradiation with chemotherapy in the form of weekly carboplatin for an AUC of 2 and paclitaxel 45 mg/m given concurrent with radiation 2) Right VATS (video-assisted thoracoscopic surgery) with right lower lobectomy and mediastinal lymph node dissection under the care of [name] 07/23/2014. The final pathology showed residual 1.8 cm moderate to poorly differentiated invasive adenocarcinoma with no pleural or lymphovascular invasion. The dissected lymph nodes were negative for malignancy and the final pathologic stage was (pT1a, pN0, pMx).  CURRENT THERAPY: Observation.   INTERVAL HISTORY: [name] MIDKIFF59y.o. female returns to the clinic today for follow-up visit after her recent surgery. The patient is recovering well with no significant complaints. She denied having any significant weight loss or night sweats. She has no chest pain, shortness of breath, cough or hemoptysis. She recently underwent a right lower lobectomy with lymph node dissection and the final pathology showed residual 1.8 cm adenocarcinoma with no lymph node metastasis. She has no nausea or vomiting, no fever or chills. The patient is here today for evaluation and discussion of her treatment options.  MEDICAL HISTORY: Past Medical History  Diagnosis Date  . Visual disturbance     Episodic  . History of migraine   . Thyroid nodule     benign by  biopsy  . Varicose veins     left leg  . Vitamin D deficiency   . Shortness of breath dyspnea     with exertion  . Wears glasses   . COPD (chronic obstructive pulmonary disease)   . GERD (gastroesophageal reflux disease)   . Cancer     lung  . PVC (premature ventricular contraction)   . Anxiety     husband also cancer pt  . Complication of anesthesia     bronchospasms post-op (pulmonary did consult but pt was discharged later that day)  With last surg. she was given albuterol inhaler & had steroid with surg.    .[name] KitchenPONV (postoperative nausea and vomiting)   . Hypertension     h/o PVC- asymptomatic   . Congenital renal atrophy     left kidney, one spontaneous stone passed   . 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 Degenerative arthritis     spine, hands & knees     ALLERGIES:  is allergic to advil; hydrocodone-guaifenesin; sulfa antibiotics; temazepam; plasticized base; and zofran.  MEDICATIONS:  Current Outpatient Prescriptions  Medication Sig Dispense Refill  . cyclobenzaprine (FLEXERIL) 10 MG tablet Take 10 mg by mouth 2 (two) times daily.  2  . docusate sodium (COLACE) 100 MG capsule Take 100 mg by mouth daily.    . metoprolol tartrate (LOPRESSOR) 25 MG tablet Take 0.5 tablets (12.5 mg total) by mouth 2 (two) times daily. 30 tablet 1  . omeprazole (PRILOSEC) 20 MG capsule Take 1 capsule (20 mg total) by mouth daily as needed (stomach acid).  60 capsule 3  . polyethylene glycol (MIRALAX / GLYCOLAX) packet Take 17 g by mouth daily as needed.    . verapamil (CALAN-SR) 180 MG CR tablet Take 180 mg by mouth at bedtime.    . Vitamin D, Ergocalciferol, (DRISDOL) 50000 UNITS CAPS capsule Take 50,000 Units by mouth every 7 (seven) days. Mondays  1   No current facility-administered medications for this visit.    SURGICAL HISTORY:  Past Surgical History  Procedure Laterality Date  . Tonsillectomy    .  Cesarean section    . Carpal tunnel release Left   . Colonoscopy w/ biopsies and polypectomy    . Dilation and curettage of uterus    . Video bronchoscopy with endobronchial ultrasound Right 04/17/2014    Procedure: VIDEO BRONCHOSCOPY WITH ENDOBRONCHIAL ULTRASOUND;  Surgeon: Collene Gobble, MD;  Location: [facility];  Service: Thoracic;  Laterality: Right;  . Varicose vein surgery      left  . Video bronchoscopy with endobronchial ultrasound N/A 07/08/2014    Procedure: VIDEO BRONCHOSCOPY WITH ENDOBRONCHIAL ULTRASOUND;  Surgeon: Ivin Poot, MD;  Location: [facility];  Service: Thoracic;  Laterality: N/A;  . Video bronchoscopy N/A 07/23/2014    Procedure: VIDEO BRONCHOSCOPY;  Surgeon: Ivin Poot, MD;  Location: [facility];  Service: Thoracic;  Laterality: N/A;  . Video assisted thoracoscopy (vats)/ lobectomy Right 07/23/2014    Procedure: VIDEO ASSISTED THORACOSCOPY (VATS)/ LOBECTOMY;  Surgeon: Ivin Poot, MD;  Location: [facility];  Service: Thoracic;  Laterality: Right;    REVIEW OF SYSTEMS:  Constitutional: negative Eyes: negative Ears, nose, mouth, throat, and face: negative Respiratory: negative Cardiovascular: negative Gastrointestinal: negative Genitourinary:negative Integument/breast: negative Hematologic/lymphatic: negative Musculoskeletal:negative Neurological: negative Behavioral/Psych: negative Endocrine: negative Allergic/Immunologic: negative   PHYSICAL EXAMINATION: General appearance: alert, cooperative and no distress Head: Normocephalic, without obvious abnormality, atraumatic Neck: no adenopathy, no JVD, supple, symmetrical, trachea midline and thyroid not enlarged, symmetric, no tenderness/mass/nodules Lymph nodes: Cervical, supraclavicular, and axillary nodes normal. Resp: clear to auscultation bilaterally Back: symmetric, no curvature. ROM normal. No CVA tenderness. Cardio: regular rate and rhythm, S1, S2 normal, no murmur, click, rub or gallop GI: soft, non-tender;  bowel sounds normal; no masses,  no organomegaly Extremities: extremities normal, atraumatic, no cyanosis or edema Neurologic: Alert and oriented X 3, normal strength and tone. Normal symmetric reflexes. Normal coordination and gait  ECOG PERFORMANCE STATUS: 0 - Asymptomatic  Blood pressure 143/62, pulse 89, temperature 97.9 F (36.6 C), temperature source Oral, weight 155 lb 3.2 oz (70.398 kg), SpO2 96 %.  LABORATORY DATA: Lab Results  Component Value Date   WBC 8.9 08/08/2014   HGB 12.9 08/08/2014   HCT 38.8 08/08/2014   MCV 93.2 08/08/2014   PLT 316 08/08/2014      Chemistry      Component Value Date/Time   NA 142 08/08/2014 1439   NA 143 06/17/2014 1147   K 3.9 08/08/2014 1439   K 4.1 06/17/2014 1147   CL 104 08/08/2014 1439   CO2 24 08/08/2014 1439   CO2 27 06/17/2014 1147   BUN 10 08/08/2014 1439   BUN 7.8 06/17/2014 1147   CREATININE 0.70 08/08/2014 1439   CREATININE 0.64 07/27/2014 0417   CREATININE 0.8 06/17/2014 1147      Component Value Date/Time   CALCIUM 9.8 08/08/2014 1439   CALCIUM 9.4 06/17/2014 1147   ALKPHOS 82 07/25/2014 0500   ALKPHOS 125 06/17/2014 1147   AST 17 07/25/2014 0500   AST 24 06/17/2014 1147  ALT 14 07/25/2014 0500   ALT 26 06/17/2014 1147   BILITOT 0.6 07/25/2014 0500   BILITOT 0.68 06/17/2014 1147       RADIOGRAPHIC STUDIES: Dg Chest 2 View  08/21/2014   CLINICAL DATA:  Post RIGHT VATS and lobectomy on and 07/23/2014, having shortness of breath and RIGHT posterior intermediate chest pain since procedure, COPD, hypertension  EXAM: CHEST  2 VIEW  COMPARISON:  08/08/2014  FINDINGS: Normal heart size, mediastinal contours and pulmonary vascularity.  Volume loss and fluid at base of RIGHT hemi thorax with minimal atelectasis similar to prior exam.  Underlying emphysematous and bronchitic changes.  Remaining lungs clear.  No acute infiltrate or pneumothorax.  Bones unremarkable.  IMPRESSION: Emphysematous and bronchitic changes with  persistent effusion and minimal atelectasis at RIGHT base.  No acute abnormalities or significant interval change.   Electronically Signed   By: Lavonia Dana M.D.   On: 08/21/2014 12:43   Dg Chest 2 View  08/08/2014   CLINICAL DATA:  Lung cancer. RIGHT lung cancer. RIGHT lower lobectomy on Friday July 23, 2014.  EXAM: CHEST  2 VIEW  COMPARISON:  08/02/2014.  FINDINGS: Cardiopericardial silhouette is unchanged. Persistent atelectasis or scarring in the LEFT mid lung and at the LEFT base. Mediastinal contours unchanged.  Small RIGHT pleural effusion appears similar to the prior exam. There is no pneumothorax identified.  IMPRESSION: RIGHT lower lobectomy with small RIGHT pleural effusion. No interval change compared to prior.   Electronically Signed   By: Dereck Ligas M.D.   On: 08/08/2014 13:41   Dg Chest 2 View  08/02/2014   CLINICAL DATA:  Ten days status post right lobectomy now complaining of shortness of breath  .  EXAM: CHEST  2 VIEW  COMPARISON:  PA and lateral chest of July 28, 2014 and July 05, 2014  FINDINGS: The faint pleural line along the lateral margin of the left upper lobe is no longer evident. There remains a moderate-sized right pleural effusion. The left lung is adequately inflated. Stable linear density in the mid lung is laterally and inferiorly. The heart and pulmonary vascularity are normal. The mediastinum is normal in width. There is mild degenerative disc space narrowing at multiple thoracic levels.  IMPRESSION: The small right-sided pneumothorax is no longer evident. There remains a moderate right-sided pleural effusion. Minimal stable scarring on the left.   Electronically Signed   By: David  Martinique   On: 08/02/2014 11:21   US Venous Img Lower Bilateral  08/22/2014   CLINICAL DATA:  Short of breath.  Evaluate for DVT.  EXAM: BILATERAL LOWER EXTREMITY VENOUS DUPLEX ULTRASOUND  TECHNIQUE: Doppler venous assessment of the left lower extremity deep venous system was  performed, including characterization of spectral flow, compressibility, and phasicity.  COMPARISON:  None.  FINDINGS: There is complete compressibility of the bilateral common femoral, femoral, and popliteal veins. Doppler analysis demonstrates respiratory phasicity and augmentation of flow upon calf compression.  No obvious calf vein DVT.  The left greater saphenous vein could not be visualized in the thigh consistent with prior treatment. The greater saphenous vein is noncompressible in the calf compatible with occlusion. Again, this is likely from prior treatment. Right greater saphenous vein is compressible and patent.  IMPRESSION: No evidence of DVT. There is evidence of prior treatment for the left greater saphenous vein for venous insufficiency.   Electronically Signed   By: Marybelle Killings M.D.   On: 08/22/2014 14:35    ASSESSMENT AND PLAN: This is a  very pleasant 59 years old white female with initial diagnosis of stage IIIa non-small cell lung cancer status post a course of concurrent chemoradiation with weekly carboplatin and paclitaxel followed by right lower lobectomy with lymph node dissection and the final pathology revealed 1.8 cm residual invasive adenocarcinoma with no evidence of metastatic disease to the dissected lymph nodes with the final pathologic stage of pT1a, pN0. The patient is here today for evaluation and discussion of her treatment options. I had a lengthy discussion with the patient about her condition. I explained to the patient that there is no survival benefit for adjuvant chemotherapy for patient with a stage IA and the current standard of care is observation and close monitoring. She is scheduled to have repeat CT scan of the chest in one month ordered by Dr. Prescott Gum. I will see the patient back for follow-up visit in 4 months with repeat CT scan of the chest The patient was also considered for enrollment in the Foundation one clinical trial for blood testing for molecular  marker to be compared to her previous tissue biomarker testing. She is interested in the trial and she will be seen by the clinical research nurse later today. She was advised to call immediately if she has any concerning symptoms in the interval. The patient voices understanding of current disease status and treatment options and is in agreement with the current care plan.  All questions were answered. The patient knows to call the clinic with any problems, questions or concerns. We can certainly see the patient much sooner if necessary.  I spent 15 minutes counseling the patient face to face. The total time spent in the appointment was 25 minutes.  Disclaimer: This note was dictated with voice recognition software. Similar sounding words can inadvertently be transcribed and may not be corrected upon review.

## 2014-09-05 ENCOUNTER — Encounter: Payer: Self-pay | Admitting: Emergency Medicine

## 2014-09-10 ENCOUNTER — Other Ambulatory Visit: Payer: Self-pay | Admitting: Internal Medicine

## 2014-09-10 ENCOUNTER — Other Ambulatory Visit (HOSPITAL_COMMUNITY)
Admission: RE | Admit: 2014-09-10 | Discharge: 2014-09-10 | Disposition: A | Discharge status: Home / Self Care | Payer: Managed Care, Other (non HMO) | Source: Ambulatory Visit | Attending: Internal Medicine | Admitting: Internal Medicine

## 2014-09-10 DIAGNOSIS — Z01419 Encounter for gynecological examination (general) (routine) without abnormal findings: Secondary | ICD-10-CM | POA: Insufficient documentation

## 2014-09-12 LAB — CYTOLOGY - PAP

## 2014-09-18 ENCOUNTER — Ambulatory Visit (INDEPENDENT_AMBULATORY_CARE_PROVIDER_SITE_OTHER): Payer: Self-pay | Admitting: Cardiothoracic Surgery

## 2014-09-18 ENCOUNTER — Ambulatory Visit
Admission: RE | Admit: 2014-09-18 | Discharge: 2014-09-18 | Disposition: A | Discharge status: Home / Self Care | Payer: Managed Care, Other (non HMO) | Source: Ambulatory Visit | Attending: Cardiothoracic Surgery | Admitting: Cardiothoracic Surgery

## 2014-09-18 ENCOUNTER — Encounter: Payer: Self-pay | Admitting: Cardiothoracic Surgery

## 2014-09-18 VITALS — BP 145/87 | HR 85 | Resp 20 | Ht 65.75 in | Wt 155.0 lb

## 2014-09-18 DIAGNOSIS — C3431 Malignant neoplasm of lower lobe, right bronchus or lung: Secondary | ICD-10-CM

## 2014-09-18 DIAGNOSIS — C3430 Malignant neoplasm of lower lobe, unspecified bronchus or lung: Secondary | ICD-10-CM

## 2014-09-18 MED ORDER — IOPAMIDOL (ISOVUE-300) INJECTION 61%
75.0000 mL | Freq: Once | INTRAVENOUS | Status: AC | PRN
  Administered 2014-09-18: 75 mL via INTRAVENOUS

## 2014-09-18 NOTE — Progress Notes (Signed)
PCP is Shamleffer, Lucienne Capers, MD Referring Provider is Tyler Pita, MD  Chief Complaint  Patient presents with  . Routine Post Op    4 week f/u with Chest CT    HPI:the patient returns for final postop visit after right lower lobe ectomy and lymph node dissection for non-small cell carcinoma. The patient was routinely diagnosed as stage III but after chemoradiation she's down stage II stage I and underwent surgical resection. The patient's oncologist has not recommended at adjunctive postop chemotherapy. The patient's postop shortness of breath is significantly improved over the past four-weeks. She is now walking 1.5 miles daily. Her postop thoracotomy pain has resolved. She's not taking pain medication. Her cough is resolved.  CT scan of the chest shows clear lungs. No at risk mediastinal nodes. She has a small postoperative pleural effusion which I believe is occupying the intrathoracic space which remained after her lobectomy. This does not need to be drained and in fact would probably recur if it was removed with thoracentesis.   Past Medical History  Diagnosis Date  . Visual disturbance     Episodic  . History of migraine   . Thyroid nodule     benign by biopsy  . Varicose veins     left leg  . Vitamin D deficiency   . Shortness of breath dyspnea     with exertion  . Wears glasses   . COPD (chronic obstructive pulmonary disease)   . GERD (gastroesophageal reflux disease)   . Cancer     lung  . PVC (premature ventricular contraction)   . Anxiety     husband also cancer pt  . Complication of anesthesia     bronchospasms post-op (pulmonary did consult but pt was discharged later that day)  With last surg. she was given albuterol inhaler & had steroid with surg.    [name] Kitchen PONV (postoperative nausea and vomiting)   . Hypertension     h/o PVC- asymptomatic   . Congenital renal atrophy     left kidney, one spontaneous stone passed   . 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 Degenerative arthritis     spine, hands & knees     Past Surgical History  Procedure Laterality Date  . Tonsillectomy    . Cesarean section    . Carpal tunnel release Left   . Colonoscopy w/ biopsies and polypectomy    . Dilation and curettage of uterus    . Video bronchoscopy with endobronchial ultrasound Right 04/17/2014    Procedure: VIDEO BRONCHOSCOPY WITH ENDOBRONCHIAL ULTRASOUND;  Surgeon: Collene Gobble, MD;  Location: [facility];  Service: Thoracic;  Laterality: Right;  . Varicose vein surgery      left  . Video bronchoscopy with endobronchial ultrasound N/A 07/08/2014    Procedure: VIDEO BRONCHOSCOPY WITH ENDOBRONCHIAL ULTRASOUND;  Surgeon: Ivin Poot, MD;  Location: [facility];  Service: Thoracic;  Laterality: N/A;  . Video bronchoscopy N/A 07/23/2014    Procedure: VIDEO BRONCHOSCOPY;  Surgeon: Ivin Poot, MD;  Location: [facility];  Service: Thoracic;  Laterality: N/A;  . Video assisted thoracoscopy (vats)/ lobectomy Right 07/23/2014    Procedure: VIDEO ASSISTED THORACOSCOPY (VATS)/ LOBECTOMY;  Surgeon: Ivin Poot, MD;  Location: [facility];  Service: Thoracic;  Laterality: Right;    Family History  Problem Relation Age of Onset  . Cancer Mother     cancer of the  small intestines  . Hypertension Father   . Atrial fibrillation Father   . Diverticulitis Father   . Hypertension Sister   . Diverticulitis Brother   . Hypertension Sister   . Headache Sister     Social History History  Substance Use Topics  . Smoking status: Former Smoker -- 0.50 packs/day for 40 years    Types: Cigarettes    Quit date: 03/17/2013  . Smokeless tobacco: Never Used  . Alcohol Use: 0.0 oz/week    0 Standard drinks or equivalent per week     Comment: " rare"    Current Outpatient Prescriptions  Medication Sig Dispense Refill  . Biotin 5000 MCG CAPS Take by mouth daily.    . Multiple Vitamin (MULTI VITAMIN DAILY) TABS  Take by mouth daily.    . verapamil (CALAN-SR) 180 MG CR tablet Take 180 mg by mouth at bedtime.    . vitamin B-12 (CYANOCOBALAMIN) 1000 MCG tablet Take 1,000 mcg by mouth daily.    . Vitamin D, Ergocalciferol, (DRISDOL) 50000 UNITS CAPS capsule Take 50,000 Units by mouth every 7 (seven) days. Mondays  1   No current facility-administered medications for this visit.    Allergies  Allergen Reactions  . Advil [Ibuprofen] Other (See Comments)    Causes hematuria advil syrup causes itching  . Hydrocodone-Guaifenesin Itching  . Sulfa Antibiotics Hives and Swelling  . Temazepam Other (See Comments)    dizziness  . Plasticized Base [Plastibase] Rash    tape  . Zofran [Ondansetron Hcl] Palpitations    Review of Systems  Improved exercise tolerance Improved dyspnea Incision well-healed  BP 145/87 mmHg  Pulse 85  Resp 20  Ht 5' 5.75" (1.67 m)  Wt 155 lb (70.308 kg)  BMI 25.21 kg/m2  SpO2 98% Physical Exam Alert and comfortable Lungs clear Heart irregular Incision clean  Diagnostic Tests: Results of CT scan of chest reviewed with patient. No evidence recurrence. Expected small postop pleural effusion which occupies the intrathoracic space not filled by shift of the remaining lung or hemidiaphragm.  Impression: Excellent postop recovery. The patient's serial surveillance scans to check for recurrence will be directed and performed by her oncologist Dr. Julien Nordmann  Plan:return here as needed for any further future  Thoracic surgical issues.  Len Childs, MD [facility] [phone]

## 2014-12-25 ENCOUNTER — Other Ambulatory Visit: Payer: Self-pay | Admitting: Medical Oncology

## 2014-12-26 ENCOUNTER — Ambulatory Visit
Admission: RE | Admit: 2014-12-26 | Discharge: 2014-12-26 | Disposition: A | Discharge status: Home / Self Care | Payer: Managed Care, Other (non HMO) | Source: Ambulatory Visit | Attending: Internal Medicine | Admitting: Internal Medicine

## 2014-12-26 ENCOUNTER — Other Ambulatory Visit (HOSPITAL_BASED_OUTPATIENT_CLINIC_OR_DEPARTMENT_OTHER): Payer: Managed Care, Other (non HMO)

## 2014-12-26 DIAGNOSIS — C3431 Malignant neoplasm of lower lobe, right bronchus or lung: Secondary | ICD-10-CM | POA: Diagnosis not present

## 2014-12-26 LAB — COMPREHENSIVE METABOLIC PANEL (CC13)
ALT: 18 U/L (ref 0–55)
AST: 20 U/L (ref 5–34)
Albumin: 3.8 g/dL (ref 3.5–5.0)
Alkaline Phosphatase: 130 U/L (ref 40–150)
Anion Gap: 7 meq/L (ref 3–11)
BUN: 10.3 mg/dL (ref 7.0–26.0)
CO2: 26 meq/L (ref 22–29)
Calcium: 9.7 mg/dL (ref 8.4–10.4)
Chloride: 108 meq/L (ref 98–109)
Creatinine: 0.8 mg/dL (ref 0.6–1.1)
EGFR: 82 ml/min/1.73 m2 — ABNORMAL LOW
Glucose: 109 mg/dL (ref 70–140)
Potassium: 4.3 meq/L (ref 3.5–5.1)
Sodium: 141 meq/L (ref 136–145)
Total Bilirubin: 0.54 mg/dL (ref 0.20–1.20)
Total Protein: 7 g/dL (ref 6.4–8.3)

## 2014-12-26 LAB — CBC WITH DIFFERENTIAL/PLATELET
BASO%: 0.5 % (ref 0.0–2.0)
Basophils Absolute: 0 10e3/uL (ref 0.0–0.1)
EOS%: 2 % (ref 0.0–7.0)
Eosinophils Absolute: 0.1 10e3/uL (ref 0.0–0.5)
HCT: 41.4 % (ref 34.8–46.6)
HGB: 13.7 g/dL (ref 11.6–15.9)
LYMPH%: 16.3 % (ref 14.0–49.7)
MCH: 29.8 pg (ref 25.1–34.0)
MCHC: 33.1 g/dL (ref 31.5–36.0)
MCV: 90 fL (ref 79.5–101.0)
MONO#: 0.3 10e3/uL (ref 0.1–0.9)
MONO%: 5.5 % (ref 0.0–14.0)
NEUT#: 4.5 10e3/uL (ref 1.5–6.5)
NEUT%: 75.7 % (ref 38.4–76.8)
Platelets: 240 10e3/uL (ref 145–400)
RBC: 4.6 10e6/uL (ref 3.70–5.45)
RDW: 13.6 % (ref 11.2–14.5)
WBC: 6 10e3/uL (ref 3.9–10.3)
lymph#: 1 10e3/uL (ref 0.9–3.3)

## 2014-12-26 MED ORDER — IOPAMIDOL (ISOVUE-300) INJECTION 61%
75.0000 mL | Freq: Once | INTRAVENOUS | Status: AC | PRN
  Administered 2014-12-26: 75 mL via INTRAVENOUS

## 2015-01-02 ENCOUNTER — Other Ambulatory Visit: Payer: Self-pay | Admitting: Internal Medicine

## 2015-01-02 ENCOUNTER — Telehealth: Payer: Self-pay | Admitting: Internal Medicine

## 2015-01-02 ENCOUNTER — Encounter: Payer: Self-pay | Admitting: Internal Medicine

## 2015-01-02 ENCOUNTER — Ambulatory Visit (HOSPITAL_BASED_OUTPATIENT_CLINIC_OR_DEPARTMENT_OTHER): Payer: Managed Care, Other (non HMO) | Admitting: Internal Medicine

## 2015-01-02 VITALS — BP 146/74 | HR 82 | Temp 97.9°F | Resp 17 | Ht 65.75 in | Wt 162.7 lb

## 2015-01-02 DIAGNOSIS — C3431 Malignant neoplasm of lower lobe, right bronchus or lung: Secondary | ICD-10-CM

## 2015-01-02 DIAGNOSIS — R51 Headache: Secondary | ICD-10-CM | POA: Diagnosis not present

## 2015-01-02 NOTE — Telephone Encounter (Signed)
per pof to sch pt appt-gave pt copy of avs-sent Dr Julien Nordmann staff messsage to adv pt stated per insurance all scans has to be done @ [facility] pt will call once reply

## 2015-01-02 NOTE — Progress Notes (Signed)
[place] [phone]   [phone]  OFFICE PROGRESS NOTE  Shamleffer, Herschell Dimes, MD [address]  [address] [place] [address]  DIAGNOSIS: Primary cancer of right lower lobe of lung  Staging form: Lung, AJCC 7th Edition  Clinical: Stage IIIA (T2a, N2, M0) - Unsigned  Staging comments: Adenocarcinoma  Foundation 1 molecular studies: positive for [id number], KRASG13C, [id number]*, [id number]*11, GATA6 amplification and [id number] Negative for BRAF, ALK, MET, RET and ERBB2  PRIOR THERAPY:  1) Concurrent chemoradiation with chemotherapy in the form of weekly carboplatin for an AUC of 2 and paclitaxel 45 mg/m given concurrent with radiation 2) Right VATS (video-assisted thoracoscopic surgery) with right lower lobectomy and mediastinal lymph node dissection under the care of [name] 07/23/2014. The final pathology showed residual 1.8 cm moderate to poorly differentiated invasive adenocarcinoma with no pleural or lymphovascular invasion. The dissected lymph nodes were negative for malignancy and the final pathologic stage was (pT1a, pN0, pMx).  CURRENT THERAPY: Observation.   INTERVAL HISTORY: [name] SAGEN89y.o. female returns to the clinic today for follow-up visit. The patient is feeling well with no significant complaints except for recent intermittent headache. She denied having any significant weight loss or night sweats. She has no chest pain, shortness of breath, cough or hemoptysis.  She has no nausea or vomiting, no fever or chills. She had repeat CT scan of the chest performed recently and she is here for evaluation and discussion of her scan results.  MEDICAL HISTORY: Past Medical History  Diagnosis Date  . Visual disturbance     Episodic  . History of migraine   . Thyroid nodule     benign by biopsy  . Varicose veins     left leg  . Vitamin D deficiency   . Shortness of breath dyspnea     with  exertion  . Wears glasses   . COPD (chronic obstructive pulmonary disease)   . GERD (gastroesophageal reflux disease)   . Cancer     lung  . PVC (premature ventricular contraction)   . Anxiety     husband also cancer pt  . Complication of anesthesia     bronchospasms post-op (pulmonary did consult but pt was discharged later that day)  With last surg. she was given albuterol inhaler & had steroid with surg.    .[name] KitchenPONV (postoperative nausea and vomiting)   . Hypertension     h/o PVC- asymptomatic   . Congenital renal atrophy     left kidney, one spontaneous stone passed   . 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 Degenerative arthritis     spine, hands & knees     ALLERGIES:  is allergic to advil; hydrocodone-guaifenesin; sulfa antibiotics; temazepam; plasticized base; and zofran.  MEDICATIONS:  Current Outpatient Prescriptions  Medication Sig Dispense Refill  . Biotin 5000 MCG CAPS Take by mouth daily.    . Multiple Vitamin (MULTI VITAMIN DAILY) TABS Take by mouth daily.    . verapamil (CALAN-SR) 180 MG CR tablet Take 180 mg by mouth at bedtime.    . vitamin B-12 (CYANOCOBALAMIN) 1000 MCG tablet Take 1,000 mcg by mouth daily.    . Vitamin D, Ergocalciferol, (DRISDOL) 50000 UNITS CAPS capsule Take 50,000 Units by mouth every 7 (seven) days. Mondays  1   No current facility-administered medications for this visit.  SURGICAL HISTORY:  Past Surgical History  Procedure Laterality Date  . Tonsillectomy    . Cesarean section    . Carpal tunnel release Left   . Colonoscopy w/ biopsies and polypectomy    . Dilation and curettage of uterus    . Video bronchoscopy with endobronchial ultrasound Right 04/17/2014    Procedure: VIDEO BRONCHOSCOPY WITH ENDOBRONCHIAL ULTRASOUND;  Surgeon: Collene Gobble, MD;  Location: [facility];  Service: Thoracic;  Laterality: Right;  . Varicose vein surgery      left  . Video  bronchoscopy with endobronchial ultrasound N/A 07/08/2014    Procedure: VIDEO BRONCHOSCOPY WITH ENDOBRONCHIAL ULTRASOUND;  Surgeon: Ivin Poot, MD;  Location: [facility];  Service: Thoracic;  Laterality: N/A;  . Video bronchoscopy N/A 07/23/2014    Procedure: VIDEO BRONCHOSCOPY;  Surgeon: Ivin Poot, MD;  Location: [facility];  Service: Thoracic;  Laterality: N/A;  . Video assisted thoracoscopy (vats)/ lobectomy Right 07/23/2014    Procedure: VIDEO ASSISTED THORACOSCOPY (VATS)/ LOBECTOMY;  Surgeon: Ivin Poot, MD;  Location: [facility];  Service: Thoracic;  Laterality: Right;    REVIEW OF SYSTEMS:  A comprehensive review of systems was negative except for: Neurological: positive for headaches   PHYSICAL EXAMINATION: General appearance: alert, cooperative and no distress Head: Normocephalic, without obvious abnormality, atraumatic Neck: no adenopathy, no JVD, supple, symmetrical, trachea midline and thyroid not enlarged, symmetric, no tenderness/mass/nodules Lymph nodes: Cervical, supraclavicular, and axillary nodes normal. Resp: clear to auscultation bilaterally Back: symmetric, no curvature. ROM normal. No CVA tenderness. Cardio: regular rate and rhythm, S1, S2 normal, no murmur, click, rub or gallop GI: soft, non-tender; bowel sounds normal; no masses,  no organomegaly Extremities: extremities normal, atraumatic, no cyanosis or edema Neurologic: Alert and oriented X 3, normal strength and tone. Normal symmetric reflexes. Normal coordination and gait  ECOG PERFORMANCE STATUS: 1 - Symptomatic but completely ambulatory  Blood pressure 146/74, pulse 82, temperature 97.9 F (36.6 C), temperature source Oral, resp. rate 17, height 5' 5.75" (1.67 m), weight 162 lb 11.2 oz (73.8 kg), SpO2 96 %.  LABORATORY DATA: Lab Results  Component Value Date   WBC 6.0 12/26/2014   HGB 13.7 12/26/2014   HCT 41.4 12/26/2014   MCV 90.0 12/26/2014   PLT 240 12/26/2014      Chemistry      Component Value  Date/Time   NA 141 12/26/2014 1053   NA 142 08/08/2014 1439   K 4.3 12/26/2014 1053   K 3.9 08/08/2014 1439   CL 104 08/08/2014 1439   CO2 26 12/26/2014 1053   CO2 24 08/08/2014 1439   BUN 10.3 12/26/2014 1053   BUN 10 08/08/2014 1439   CREATININE 0.8 12/26/2014 1053   CREATININE 0.70 08/08/2014 1439   CREATININE 0.64 07/27/2014 0417      Component Value Date/Time   CALCIUM 9.7 12/26/2014 1053   CALCIUM 9.8 08/08/2014 1439   ALKPHOS 130 12/26/2014 1053   ALKPHOS 82 07/25/2014 0500   AST 20 12/26/2014 1053   AST 17 07/25/2014 0500   ALT 18 12/26/2014 1053   ALT 14 07/25/2014 0500   BILITOT 0.54 12/26/2014 1053   BILITOT 0.6 07/25/2014 0500       RADIOGRAPHIC STUDIES: Ct Chest W Contrast  12/26/2014   CLINICAL DATA:  Followup right lower lobe lung carcinoma. Status post right lower lobectomy, chemotherapy, and radiation therapy. Shortness of breath with exertion.  EXAM: CT CHEST WITH CONTRAST  TECHNIQUE: Multidetector CT imaging of the chest was performed during  intravenous contrast administration.  CONTRAST:  56m ISOVUE-300 IOPAMIDOL (ISOVUE-300) INJECTION 61%  COMPARISON:  09/18/2014  FINDINGS: Mediastinum/Lymph Nodes: No masses or pathologically enlarged lymph nodes identified. Stable 1 cm low-attenuation right thyroid lobe nodule again noted.  Lungs/Pleura: Postop changes again seen from previous right lower lobectomy. No evidence of pulmonary infiltrate or mass. Mild emphysema again noted. Tiny right pleural effusion is seen which is nearly completely resolved since previous study.  Musculoskeletal/Soft Tissues: No suspicious bone lesions or other significant chest wall abnormality.  Upper Abdomen: Small left hepatic lobe cyst remains stable. Visualized portions of adrenal glands are unremarkable.  IMPRESSION: Tiny right pleural effusion shows near complete resolution since previous study. No evidence of recurrent or metastatic carcinoma within the thorax.   Electronically Signed    By: JEarle GellM.D.   On: 12/26/2014 13:52    ASSESSMENT AND PLAN: This is a very pleasant 59years old white female with initial diagnosis of stage IIIa non-small cell lung cancer status post a course of concurrent chemoradiation with weekly carboplatin and paclitaxel followed by right lower lobectomy with lymph node dissection and the final pathology revealed 1.8 cm residual invasive adenocarcinoma with no evidence of metastatic disease to the dissected lymph nodes with the final pathologic stage of pT1a, pN0. She is currently on observation and feeling well except for the intermittent headache. The recent CT scan of the chest showed no evidence for disease recurrence. I discussed the scan results with the patient and recommended for her to continue on observation with repeat CT scan of the chest in 3 months. For the intermittent headache, I will order CT scan of the head to rule out any metastatic disease to the brain and if there is any concerning findings should be considered for MRI of the brain for further evaluation. She was advised to call immediately if she has any concerning symptoms in the interval. The patient voices understanding of current disease status and treatment options and is in agreement with the current care plan.  All questions were answered. The patient knows to call the clinic with any problems, questions or concerns. We can certainly see the patient much sooner if necessary.  Disclaimer: This note was dictated with voice recognition software. Similar sounding words can inadvertently be transcribed and may not be corrected upon review.

## 2015-01-03 ENCOUNTER — Telehealth: Payer: Self-pay | Admitting: Internal Medicine

## 2015-01-03 ENCOUNTER — Telehealth: Payer: Self-pay

## 2015-01-03 NOTE — Telephone Encounter (Signed)
perpof to sch pt appt-per reply from MM he chgd loation to [employer] [facility] imaging to sch they need to pt to call for screening -cld pt to adv of # to call GI for questions b4 scheduling-pt understood

## 2015-01-03 NOTE — Telephone Encounter (Signed)
Pt called requesting labs on 10/10 be drawn STAT for CT being done at 140 pm the same day. Note placed on lab visit. Called back to let her know.

## 2015-01-06 ENCOUNTER — Telehealth: Payer: Self-pay | Admitting: *Deleted

## 2015-01-06 ENCOUNTER — Encounter: Payer: Self-pay | Admitting: Internal Medicine

## 2015-01-06 ENCOUNTER — Inpatient Hospital Stay: Admission: RE | Admit: 2015-01-06 | Payer: Managed Care, Other (non HMO) | Source: Ambulatory Visit

## 2015-01-06 NOTE — Telephone Encounter (Signed)
Voicemail from patient requesting "Approval be taken care of for CT Head at [facility].  GI called me cancelling the test due to it not being approved by insurance.  Last month I have intermittent frontal headaches th at radiate to the right.  The first place re-occurrence is the brain.  Reschedule the CT and please call me at [phone] with the appointment.    as she has been having Call routed to [facility]

## 2015-01-06 NOTE — Progress Notes (Unsigned)
Spoke with [name] [name] re: cancellation of head ct scan @ GI.  The patient will  Receive the same 13 page denial letter as we did.  I informed her I would find out what Dr. Julien Nordmann wantd to fo in regards to resubmitting or peer-to-peer.  Pt. voiced understanding.

## 2015-01-06 NOTE — Telephone Encounter (Signed)
If not approved, go to ED to get it done there.

## 2015-01-07 NOTE — Telephone Encounter (Signed)
Called Ms. Welter in reference to persistent, intermittent headaches reported.  Gave instructions to go to ER for these headaches as a CT head could be performed by this means.  "I'm not going to do that.  I spoke with Vaughan Basta yesterday and she will have Dr. Julien Nordmann get this taken care of."  Advised that if headaches persist and are problematic to go to ER as CT head approval is at the discretion of her insurance company.  "I will talk with Vaughan Basta in managed care, thanks for calling, have a good day."  Call ended.

## 2015-01-14 ENCOUNTER — Encounter: Payer: Self-pay | Admitting: Internal Medicine

## 2015-01-14 NOTE — Progress Notes (Unsigned)
I left a message for the patient in regard to a denied head ct scan.  I called her to give the update that I had concerning the procedure.  Advised patient to call me back if she desired.

## 2015-01-21 ENCOUNTER — Telehealth: Payer: Self-pay | Admitting: *Deleted

## 2015-01-21 ENCOUNTER — Telehealth: Payer: Self-pay | Admitting: Medical Oncology

## 2015-01-21 ENCOUNTER — Encounter: Payer: Self-pay | Admitting: Internal Medicine

## 2015-01-21 DIAGNOSIS — R51 Headache: Secondary | ICD-10-CM

## 2015-01-21 DIAGNOSIS — R519 Headache, unspecified: Secondary | ICD-10-CM

## 2015-01-21 DIAGNOSIS — C3431 Malignant neoplasm of lower lobe, right bronchus or lung: Secondary | ICD-10-CM

## 2015-01-21 NOTE — Telephone Encounter (Signed)
Pt symptoms reviewed with [facility] and brain MRI ordered.Pt notified.onc tx request sent

## 2015-01-21 NOTE — Telephone Encounter (Signed)
Reports increased frequency intermittent headaches and near fall . Denies other symptoms.No other symptoms. Mohamed ordered stat MRI.

## 2015-01-21 NOTE — Telephone Encounter (Signed)
PT. STATES DR.MOHAMED NEEDS TO DO A PEER TO PEER REVIEW. SHE IS HAVING AN INCREASED NUMBER OF HEADACHES AND OVER THE WEEKEND PT. HAD A PROBLEM WITH HER BALANCE WHICH ALMOST MADE HER FALL. THIS IS WHY SHE NEEDS ANOTHER HEAD CT SCAN. PT. WANTS TO KNOW HOW THE PRIOR AUTHORIZATION PROCESS IS PROGRESSING.

## 2015-01-21 NOTE — Progress Notes (Unsigned)
Spoke with patient and gave her the time of her Brain Mri.  08/169/2016 @ 9:00 pm.  Patient voiced understanding.

## 2015-01-22 ENCOUNTER — Ambulatory Visit (HOSPITAL_COMMUNITY)
Admission: RE | Admit: 2015-01-22 | Discharge: 2015-01-22 | Disposition: A | Discharge status: Home / Self Care | Payer: Managed Care, Other (non HMO) | Source: Ambulatory Visit | Attending: Internal Medicine | Admitting: Internal Medicine

## 2015-01-22 DIAGNOSIS — Z85118 Personal history of other malignant neoplasm of bronchus and lung: Secondary | ICD-10-CM | POA: Insufficient documentation

## 2015-01-22 DIAGNOSIS — R51 Headache: Secondary | ICD-10-CM | POA: Insufficient documentation

## 2015-01-22 DIAGNOSIS — R262 Difficulty in walking, not elsewhere classified: Secondary | ICD-10-CM | POA: Insufficient documentation

## 2015-01-22 DIAGNOSIS — R519 Headache, unspecified: Secondary | ICD-10-CM

## 2015-01-22 DIAGNOSIS — C3431 Malignant neoplasm of lower lobe, right bronchus or lung: Secondary | ICD-10-CM

## 2015-01-22 MED ORDER — GADOBENATE DIMEGLUMINE 529 MG/ML IV SOLN
15.0000 mL | Freq: Once | INTRAVENOUS | Status: AC | PRN
  Administered 2015-01-22: 14 mL via INTRAVENOUS

## 2015-01-24 ENCOUNTER — Telehealth: Payer: Self-pay | Admitting: Medical Oncology

## 2015-01-24 NOTE — Telephone Encounter (Signed)
MRI results given to pt. I instructed her to contact PCP for headache.

## 2015-02-12 ENCOUNTER — Encounter: Payer: Self-pay | Admitting: Emergency Medicine

## 2015-02-12 ENCOUNTER — Encounter (INDEPENDENT_AMBULATORY_CARE_PROVIDER_SITE_OTHER): Payer: Self-pay

## 2015-02-12 ENCOUNTER — Ambulatory Visit (INDEPENDENT_AMBULATORY_CARE_PROVIDER_SITE_OTHER): Payer: Managed Care, Other (non HMO) | Admitting: Emergency Medicine

## 2015-02-12 VITALS — BP 124/72 | HR 90 | Ht 66.0 in | Wt 167.8 lb

## 2015-02-12 DIAGNOSIS — J449 Chronic obstructive pulmonary disease, unspecified: Secondary | ICD-10-CM

## 2015-02-12 DIAGNOSIS — C3431 Malignant neoplasm of lower lobe, right bronchus or lung: Secondary | ICD-10-CM

## 2015-02-12 NOTE — Progress Notes (Signed)
Subjective:    Patient ID: [name] [name], female    DOB: 06-Jan-1956, 59 y.o.   MRN: [id number]  HPI 59 yo woman, former smoker (20 pk-yrs), hx of HTN, migraines. She is referred by Dr Amedeo Kinsman for chronic cough, hemoptysis. She has been given a dx of COPD in 2014. Spirometry was done > per pt shows AFL. Imaging has identified some L sided linear atx (CXR 02/2014).   The cough has been non-productive for most part, often paroxysmal, can cause emesis. Has waxed and waned. She has been treated with abx, with scheduled BD's. For about a week in August she experienced some hemoptysis, blood streaks in clear sputum.    She denies any real allergy sx. She tries loratadine on her own > no difference. Was started on flonase and seemed to help some. She does not have any heartburn or noticeable GERD  ROV 05/09/14 -- follow up visit after FOB and EBUS >> showed invasive adenoCA. She has started chemo and XRT. 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 Normal volumes, decrease DLCO.  She is feeling fairly well. Breathing has been better since she quit smoking. She does have some mild DOE, paces herself. She has had bronchitis in the Fall for several years. Her cough is better. She has been on both Symbicort and Spiriva in the past - no real difference.   ROV 02/12/15 -- follow-up visit for history of COPD and also adenocarcinoma the lung. She underwent neoadjuvant chemotherapy and radiation and then was able to have a VATS right lower lobectomy and mediastinal lymph node dissection with negative nodes. This was done by Dr. Darcey Nora in February. Her most recent surveillance CT scan of the chest done 12/26/14 and I have personally reviewed today.  There is no evidence of recurrence.  Next is scheduled for October.  She tells me that her her breathing is stable, although she admits to being fairly sedentary. Her cough has resolved. She has moderately severe AFL on her original Arlyce Harman from 05/2014.     Review of Systems  Constitutional: Negative for fever and unexpected weight change.  HENT: Negative for congestion, dental problem, ear pain, nosebleeds, postnasal drip, rhinorrhea, sinus pressure, sneezing, sore throat and trouble swallowing.   Eyes: Negative for redness and itching.  Respiratory: Positive for cough and shortness of breath. Negative for chest tightness and wheezing.   Cardiovascular: Negative for palpitations and leg swelling.  Gastrointestinal: Negative for nausea and vomiting.  Genitourinary: Negative for dysuria.  Musculoskeletal: Negative for joint swelling.  Skin: Negative for rash.  Neurological: Negative for headaches.  Hematological: Does not bruise/bleed easily.  Psychiatric/Behavioral: Negative for dysphoric mood. The patient is not nervous/anxious.        Objective:   Physical Exam Filed Vitals:   02/12/15 1605  BP: 124/72  Pulse: 90  Height: '5\' 6"'$  (1.676 m)  Weight: 167 lb 12.8 oz (76.114 kg)  SpO2: 95%   Gen: Pleasant, well-nourished, in no distress,  normal affect  ENT: No lesions,  mouth clear,  oropharynx clear, no postnasal drip  Lungs: No use of accessory muscles, clear without rales or rhonchi  Cardiovascular: RRR, heart sounds normal, no murmur or gallops, no peripheral edema  Musculoskeletal: No deformities, no cyanosis or clubbing  Neuro: alert, non focal  Skin: Warm, no lesions or rashes       Assessment & Plan:  COPD (chronic obstructive pulmonary disease) She has moderate to moderately severe obstruction on pulmonary function testing pre-surgery, but  her overall clinical status is consistent with GOLD B disease. I believe we can continue using albuterol when necessary. I mentioned that she may want to consider being more liberal with that she use especially before significant exercise. If and when she begins to have daily symptoms or functional limitation that I believe we need to start long-acting bronchodilators.    Primary cancer of right lower lobe of lung Status post right lower lobectomy and node dissection for IIIa disease. She is following CT scans with Dr. Julien Nordmann.

## 2015-02-12 NOTE — Patient Instructions (Signed)
Please continue to keep your albuterol available to use if needed for shortness of breath. You may also consider using this about 10-15 minutes before significant exertion We will not start an every day scheduled inhaled medicine at this time. We may decide to do so in the future depending on your breathing and exercise tolerance We will send a referral to [facility] internal medicine Follow with Dr Lamonte Sakai in 6 months or sooner if you have any problems

## 2015-02-12 NOTE — Assessment & Plan Note (Signed)
She has moderate to moderately severe obstruction on pulmonary function testing pre-surgery, but her overall clinical status is consistent with GOLD B disease. I believe we can continue using albuterol when necessary. I mentioned that she may want to consider being more liberal with that she use especially before significant exercise. If and when she begins to have daily symptoms or functional limitation that I believe we need to start long-acting bronchodilators.

## 2015-02-12 NOTE — Assessment & Plan Note (Signed)
Status post right lower lobectomy and node dissection for IIIa disease. She is following CT scans with Dr. Julien Nordmann.

## 2015-02-14 ENCOUNTER — Telehealth: Payer: Self-pay | Admitting: Internal Medicine

## 2015-02-14 NOTE — Telephone Encounter (Signed)
returned call and advised husband to tell wife to call back to confirm appt

## 2015-03-14 ENCOUNTER — Other Ambulatory Visit: Payer: Self-pay | Admitting: Medical Oncology

## 2015-03-14 DIAGNOSIS — C3431 Malignant neoplasm of lower lobe, right bronchus or lung: Secondary | ICD-10-CM

## 2015-03-17 ENCOUNTER — Telehealth: Payer: Self-pay | Admitting: Internal Medicine

## 2015-03-17 ENCOUNTER — Other Ambulatory Visit: Payer: Managed Care, Other (non HMO)

## 2015-03-17 ENCOUNTER — Encounter: Payer: Self-pay | Admitting: Internal Medicine

## 2015-03-17 ENCOUNTER — Inpatient Hospital Stay: Admission: RE | Admit: 2015-03-17 | Payer: Managed Care, Other (non HMO) | Source: Ambulatory Visit

## 2015-03-17 NOTE — Progress Notes (Unsigned)
Spoke with Mrs. [name] [name] and Dr. Julien Nordmann concerning the denial of her ct scan.  Patient will not come in today for labs and will wait until ct scan is approved to have labs.  Patient voiced understanding.

## 2015-03-17 NOTE — Telephone Encounter (Signed)
s.w. pt and confirmed appt cx due to ct not covered by insurance .[name] Kitchen..pt ok and aware

## 2015-03-18 ENCOUNTER — Encounter: Payer: Self-pay | Admitting: Internal Medicine

## 2015-03-18 NOTE — Progress Notes (Unsigned)
Left a voice message on patient's phone concerning the Denied Chest Ct Scan. Dr.Moamed advised to schedule the nest scan 6 months from the scan done 12/2014.  I eft my name and number on patient's VM.

## 2015-03-20 ENCOUNTER — Telehealth: Payer: Self-pay | Admitting: *Deleted

## 2015-03-20 DIAGNOSIS — C3431 Malignant neoplasm of lower lobe, right bronchus or lung: Secondary | ICD-10-CM

## 2015-03-20 NOTE — Telephone Encounter (Addendum)
"  Please ask Dr. Julien Nordmann if I, [name] [name] need to be seen Monday, March 24, 2015 at 1:30.  My scans were cancelled.  [employer] won't budge on their six month protocol.  I also need to know how to make sure I get everything done in January at the six month mark.  My Insurance at this time requires use of [facility] [address]    This nurse will notify Dr. Julien Nordmann of this request and have scheduled a reminder message for F/U.  (Last CT chest was 12-26-2014 and CT brain was 01-22-2015.)

## 2015-03-21 ENCOUNTER — Other Ambulatory Visit: Payer: Self-pay | Admitting: Medical Oncology

## 2015-03-21 ENCOUNTER — Telehealth: Payer: Self-pay | Admitting: Internal Medicine

## 2015-03-21 NOTE — Telephone Encounter (Signed)
Called Mrs. Haman with Dr. Worthy Flank orders to reschedule scans and F/U six months out from last scan.  Appointments will need to fall after June 28, 2015.  P.O.F. Generated.  Message left on voicemail with instruction she does not need to come in on Monday 03-24-2015.

## 2015-03-21 NOTE — Telephone Encounter (Signed)
Needs scans before F/U.  Scans can be as early as 06-29-2014 per [employer] guidelines.   Her F/U will perhaps fall on 07-13-2014 if CT scan can be scheduled 06-29-2014.

## 2015-03-21 NOTE — Telephone Encounter (Signed)
Left a message with a new appointment per pof and patient will get a call with her scan

## 2015-03-21 NOTE — Telephone Encounter (Signed)
Reschedule scan and appointment after the scan in 6 months from the previous scan. Thank you.

## 2015-03-24 ENCOUNTER — Ambulatory Visit: Payer: Managed Care, Other (non HMO) | Admitting: Internal Medicine

## 2015-03-24 NOTE — Telephone Encounter (Signed)
Ct scan ordered by Dr Tammi Klippel for jan 2017 -not yet scheduled.

## 2015-04-10 ENCOUNTER — Telehealth: Payer: Self-pay

## 2015-04-10 ENCOUNTER — Telehealth: Payer: Self-pay | Admitting: Internal Medicine

## 2015-04-10 ENCOUNTER — Telehealth: Payer: Self-pay | Admitting: Medical Oncology

## 2015-04-10 NOTE — Telephone Encounter (Signed)
erroneous

## 2015-04-10 NOTE — Telephone Encounter (Signed)
OK with me.

## 2015-04-10 NOTE — Telephone Encounter (Signed)
Mailed pt medical records to [facility].

## 2015-04-10 NOTE — Telephone Encounter (Signed)
Pt is scheduled for cataract surgery on 11/10 and she wants to make sure this is OK per Dr Julien Nordmann.

## 2015-04-11 NOTE — Telephone Encounter (Signed)
Notified pt "ok to have cataract surgery" Pt verbalized understanding, no further concerns.

## 2015-04-21 ENCOUNTER — Ambulatory Visit: Payer: Managed Care, Other (non HMO) | Admitting: Emergency Medicine

## 2015-04-22 ENCOUNTER — Ambulatory Visit: Payer: Managed Care, Other (non HMO) | Admitting: Emergency Medicine

## 2015-04-23 ENCOUNTER — Encounter: Payer: Self-pay | Admitting: Adult Health

## 2015-04-23 ENCOUNTER — Ambulatory Visit (INDEPENDENT_AMBULATORY_CARE_PROVIDER_SITE_OTHER): Payer: Managed Care, Other (non HMO) | Admitting: Adult Health

## 2015-04-23 VITALS — BP 120/82 | HR 82 | Temp 98.1°F | Ht 65.0 in | Wt 168.0 lb

## 2015-04-23 DIAGNOSIS — J441 Chronic obstructive pulmonary disease with (acute) exacerbation: Secondary | ICD-10-CM

## 2015-04-23 MED ORDER — AZITHROMYCIN 250 MG PO TABS: ORAL_TABLET | ORAL | Status: AC

## 2015-04-23 NOTE — Patient Instructions (Addendum)
Mucinex DM Twice daily  As needed  Cough/congestion  Fluids and rest  Zpack take as directed.  Follow up Dr. Lamonte Sakai in 2 months and As needed   Please contact office for sooner follow up if symptoms do not improve or worsen or seek emergency care

## 2015-04-26 NOTE — Progress Notes (Signed)
Subjective:    Patient ID: [name] [name], female    DOB: November 28, 1955, 59 y.o.   MRN: [id number]  HPI 86 with COPD and adenocarcinoma of the lung (s/p RLL lobectomy, chemo/xrt)    04/23/15 Acute OV : COPD , lung cancer  Pt presents for an acute office visit.  Complains of 10 day of increased SOB, prod cough with yellow/white mucus, chills, nasal congestion and body aches since 04/14/15. Denies any fever, nausea or vomiting. No hemoptysis , chest pain, orthopnea, edema or fever.  CT chest 12/2014 showed no evidence recurrent dz.    Past Medical History  Diagnosis Date  . Visual disturbance     Episodic  . History of migraine   . Thyroid nodule     benign by biopsy  . Varicose veins     left leg  . Vitamin D deficiency   . Shortness of breath dyspnea     with exertion  . Wears glasses   . COPD (chronic obstructive pulmonary disease) ([place])   . GERD (gastroesophageal reflux disease)   . Cancer ([place])     lung  . PVC (premature ventricular contraction)   . Anxiety     husband also cancer pt  . Complication of anesthesia     bronchospasms post-op (pulmonary did consult but pt was discharged later that day)  With last surg. she was given albuterol inhaler & had steroid with surg.    [name] Kitchen PONV (postoperative nausea and vomiting)   . Hypertension     h/o PVC- asymptomatic   . Congenital renal atrophy     left kidney, one spontaneous stone passed   . 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 Degenerative arthritis     spine, hands & knees     Current Outpatient Prescriptions on File Prior to Visit  Medication Sig Dispense Refill  . Biotin 5000 MCG CAPS Take by mouth daily.    . Multiple Vitamin (MULTI VITAMIN DAILY) TABS Take by mouth daily.    . verapamil (CALAN-SR) 180 MG CR tablet Take 180 mg by mouth at bedtime.    . vitamin B-12 (CYANOCOBALAMIN) 1000 MCG tablet Take 1,000 mcg by mouth daily.    .  Vitamin D, Ergocalciferol, (DRISDOL) 50000 UNITS CAPS capsule Take 50,000 Units by mouth every 7 (seven) days. Mondays  1   No current facility-administered medications on file prior to visit.      Review of Systems Constitutional:   No  weight loss, night sweats,  Fevers, chills,  +fatigue, or  lassitude.  HEENT:   No headaches,  Difficulty swallowing,  Tooth/dental problems, or  Sore throat,                No sneezing, itching, ear ache,  =nasal congestion, post nasal drip,   CV:  No chest pain,  Orthopnea, PND, swelling in lower extremities, anasarca, dizziness, palpitations, syncope.   GI  No heartburn, indigestion, abdominal pain, nausea, vomiting, diarrhea, change in bowel habits, loss of appetite, bloody stools.   Resp:.  No chest wall deformity  Skin: no rash or lesions.  GU: no dysuria, change in color of urine, no urgency or frequency.  No flank pain, no hematuria   MS:  No joint pain or swelling.  No decreased range of motion.  No back pain.  Psych:  No change in mood or affect. No depression or anxiety.  No  memory loss.         Objective:   Physical Exam GEN: A/Ox3; pleasant , NAD VS reviewed   HEENT:  [place]/AT,  EACs-clear, TMs-wnl, NOSE-clear, THROAT-clear, no lesions, no postnasal drip or exudate noted.   NECK:  Supple w/ fair ROM; no JVD; normal carotid impulses w/o bruits; no thyromegaly or nodules palpated; no lymphadenopathy.  RESP  Clear  P & A; w/o, wheezes/ rales/ or rhonchi.no accessory muscle use, no dullness to percussion  CARD:  RRR, no m/r/g  , no peripheral edema, pulses intact, no cyanosis or clubbing.  GI:   Soft & nt; nml bowel sounds; no organomegaly or masses detected.  Musco: Warm bil, no deformities or joint swelling noted.   Neuro: alert, no focal deficits noted.    Skin: Warm, no lesions or rashes         Assessment & Plan:

## 2015-04-28 NOTE — Assessment & Plan Note (Signed)
Mild flare   Plan  Mucinex DM Twice daily  As needed  Cough/congestion  Fluids and rest  Zpack take as directed.  Follow up Dr. Lamonte Sakai in 2 months and As needed   Please contact office for sooner follow up if symptoms do not improve or worsen or seek emergency care

## 2015-05-16 ENCOUNTER — Telehealth: Payer: Self-pay | Admitting: *Deleted

## 2015-05-16 NOTE — Telephone Encounter (Signed)
On 05-16-15 mail medical records to [facility], it was consult note, end of tx note, follow up note, sim & planning note.

## 2015-05-27 ENCOUNTER — Telehealth: Payer: Self-pay | Admitting: Internal Medicine

## 2015-05-27 NOTE — Telephone Encounter (Signed)
Returned patient call re lab/ct at [facility] for January. Spoke with patient re lab 1/20 @ 1:30 pm and ct @ 3 pm at [facility]. Also confirmed 1/25 f/u w/MM.

## 2015-06-27 ENCOUNTER — Ambulatory Visit
Admission: RE | Admit: 2015-06-27 | Discharge: 2015-06-27 | Disposition: A | Discharge status: Home / Self Care | Payer: Managed Care, Other (non HMO) | Source: Ambulatory Visit | Attending: Internal Medicine | Admitting: Internal Medicine

## 2015-06-27 ENCOUNTER — Other Ambulatory Visit (HOSPITAL_BASED_OUTPATIENT_CLINIC_OR_DEPARTMENT_OTHER): Payer: Managed Care, Other (non HMO)

## 2015-06-27 DIAGNOSIS — C3431 Malignant neoplasm of lower lobe, right bronchus or lung: Secondary | ICD-10-CM

## 2015-06-27 LAB — CBC WITH DIFFERENTIAL/PLATELET
BASO%: 1.1 % (ref 0.0–2.0)
BASOS ABS: 0.1 10*3/uL (ref 0.0–0.1)
EOS ABS: 0.1 10*3/uL (ref 0.0–0.5)
EOS%: 2.3 % (ref 0.0–7.0)
HCT: 41.9 % (ref 34.8–46.6)
HGB: 13.8 g/dL (ref 11.6–15.9)
LYMPH%: 21.1 % (ref 14.0–49.7)
MCH: 29.5 pg (ref 25.1–34.0)
MCHC: 33 g/dL (ref 31.5–36.0)
MCV: 89.4 fL (ref 79.5–101.0)
MONO#: 0.4 10*3/uL (ref 0.1–0.9)
MONO%: 7 % (ref 0.0–14.0)
NEUT#: 3.6 10*3/uL (ref 1.5–6.5)
NEUT%: 68.5 % (ref 38.4–76.8)
Platelets: 246 10*3/uL (ref 145–400)
RBC: 4.69 10*6/uL (ref 3.70–5.45)
RDW: 13.1 % (ref 11.2–14.5)
WBC: 5.3 10*3/uL (ref 3.9–10.3)
lymph#: 1.1 10*3/uL (ref 0.9–3.3)

## 2015-06-27 LAB — COMPREHENSIVE METABOLIC PANEL
ALT: 19 U/L (ref 0–55)
AST: 19 U/L (ref 5–34)
Albumin: 3.8 g/dL (ref 3.5–5.0)
Alkaline Phosphatase: 104 U/L (ref 40–150)
Anion Gap: 8 mEq/L (ref 3–11)
BUN: 9.2 mg/dL (ref 7.0–26.0)
CO2: 24 meq/L (ref 22–29)
Calcium: 9.3 mg/dL (ref 8.4–10.4)
Chloride: 110 mEq/L — ABNORMAL HIGH (ref 98–109)
Creatinine: 0.7 mg/dL (ref 0.6–1.1)
EGFR: 87 mL/min/{1.73_m2} — AB (ref >90)
GLUCOSE: 95 mg/dL (ref 70–140)
POTASSIUM: 4.1 meq/L (ref 3.5–5.1)
SODIUM: 141 meq/L (ref 136–145)
Total Bilirubin: 0.51 mg/dL (ref 0.20–1.20)
Total Protein: 7 g/dL (ref 6.4–8.3)

## 2015-06-27 MED ORDER — IOPAMIDOL (ISOVUE-300) INJECTION 61%
70.0000 mL | Freq: Once | INTRAVENOUS | Status: AC | PRN
  Administered 2015-06-27: 70 mL via INTRAVENOUS

## 2015-07-02 ENCOUNTER — Ambulatory Visit (HOSPITAL_BASED_OUTPATIENT_CLINIC_OR_DEPARTMENT_OTHER): Payer: Managed Care, Other (non HMO) | Admitting: Internal Medicine

## 2015-07-02 ENCOUNTER — Encounter: Payer: Self-pay | Admitting: Internal Medicine

## 2015-07-02 ENCOUNTER — Telehealth: Payer: Self-pay | Admitting: Internal Medicine

## 2015-07-02 VITALS — BP 143/81 | HR 80 | Temp 98.0°F | Resp 18 | Ht 65.0 in | Wt 169.3 lb

## 2015-07-02 DIAGNOSIS — C3431 Malignant neoplasm of lower lobe, right bronchus or lung: Secondary | ICD-10-CM

## 2015-07-02 DIAGNOSIS — Z85118 Personal history of other malignant neoplasm of bronchus and lung: Secondary | ICD-10-CM

## 2015-07-02 NOTE — Progress Notes (Signed)
[facility] [phone]   [phone]  OFFICE PROGRESS NOTE  [name] [name], [name] [name] [id number]  DIAGNOSIS: Primary cancer of right lower lobe of lung  Staging form: Lung, AJCC 7th Edition  Clinical: Stage IIIA (T2a, N2, M0) - Unsigned  Staging comments: Adenocarcinoma  Foundation 1 molecular studies: positive for [id number], KRASG13C, [id number]*, [id number]*11, GATA6 amplification and [id number] Negative for BRAF, ALK, MET, RET and ERBB2  PRIOR THERAPY:  1) Concurrent chemoradiation with chemotherapy in the form of weekly carboplatin for an AUC of 2 and paclitaxel 45 mg/m given concurrent with radiation 2) Right VATS (video-assisted thoracoscopic surgery) with right lower lobectomy and mediastinal lymph node dissection under the care of [name] 07/23/2014. The final pathology showed residual 1.8 cm moderate to poorly differentiated invasive adenocarcinoma with no pleural or lymphovascular invasion. The dissected lymph nodes were negative for malignancy and the final pathologic stage was (pT1a, pN0, pMx).  CURRENT THERAPY: Observation.   INTERVAL HISTORY: [name] SCHNAPP89y.o. female returns to the clinic today for follow-up visit accompanied by her husband. The patient is feeling well with no significant complaints except for baseline shortness of breath. She denied having any significant weight loss or night sweats. She has no chest pain, cough or hemoptysis.  She has no nausea or vomiting, no fever or chills. She had repeat CT scan of the head and chest performed recently and she is here for evaluation and discussion of her scan results.  MEDICAL HISTORY: Past Medical History  Diagnosis Date  . Visual disturbance     Episodic  . History of migraine   . Thyroid nodule     benign by biopsy  . Varicose veins     left leg  . Vitamin D deficiency   . Shortness of breath dyspnea     with exertion    . Wears glasses   . [place]   . GERD (gastroesophageal reflux disease)   . [place]     [place]  . PVC (premature ventricular contraction)   . Anxiety     husband also cancer pt  . Complication of anesthesia     bronchospasms post-op (pulmonary did consult but pt was discharged later that day)  With last surg. she was given albuterol inhaler & had steroid with surg.    .[name] KitchenPONV (postoperative nausea and vomiting)   . Hypertension     h/o PVC- asymptomatic   . Congenital renal atrophy     left kidney, one spontaneous stone passed   . 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 Degenerative arthritis     spine, hands & knees     ALLERGIES:  is allergic to advil; hydrocodone-guaifenesin; sulfa antibiotics; temazepam; plasticized base; and zofran.  MEDICATIONS:  Current Outpatient Prescriptions  Medication Sig Dispense Refill  . Biotin 5000 MCG CAPS Take by mouth daily.    . Multiple Vitamin (MULTI VITAMIN DAILY) TABS Take by mouth daily.    .[name] KitchenPENNSAID 2 % SOLN     . PROAIR HFA 108 (90 BASE) MCG/ACT inhaler Inhale 2 puffs into the lungs every 4 (four) hours as needed.    . verapamil (CALAN-SR) 180 MG CR tablet Take 180 mg by mouth at bedtime.    . vitamin B-12 (CYANOCOBALAMIN) 1000 MCG tablet Take 1,000 mcg by mouth daily.    .[name] [name]  Vitamin D, Ergocalciferol, (DRISDOL) 50000 UNITS CAPS capsule Take 50,000 Units by mouth every 7 (seven) days. Mondays  1   No current facility-administered medications for this visit.    SURGICAL HISTORY:  Past Surgical History  Procedure Laterality Date  . Tonsillectomy    . Cesarean section    . Carpal tunnel release Left   . Colonoscopy w/ biopsies and polypectomy    . Dilation and curettage of uterus    . Video bronchoscopy with endobronchial ultrasound Right 04/17/2014    Procedure: VIDEO BRONCHOSCOPY WITH ENDOBRONCHIAL ULTRASOUND;   Surgeon: Collene Gobble, MD;  Location: [facility];  Service: Thoracic;  Laterality: Right;  . Varicose vein surgery      left  . Video bronchoscopy with endobronchial ultrasound N/A 07/08/2014    Procedure: VIDEO BRONCHOSCOPY WITH ENDOBRONCHIAL ULTRASOUND;  Surgeon: Ivin Poot, MD;  Location: [facility];  Service: Thoracic;  Laterality: N/A;  . Video bronchoscopy N/A 07/23/2014    Procedure: VIDEO BRONCHOSCOPY;  Surgeon: Ivin Poot, MD;  Location: [facility];  Service: Thoracic;  Laterality: N/A;  . Video assisted thoracoscopy (vats)/ lobectomy Right 07/23/2014    Procedure: VIDEO ASSISTED THORACOSCOPY (VATS)/ LOBECTOMY;  Surgeon: Ivin Poot, MD;  Location: [facility];  Service: Thoracic;  Laterality: Right;    REVIEW OF SYSTEMS:  A comprehensive review of systems was negative except for: Respiratory: positive for dyspnea on exertion   PHYSICAL EXAMINATION: General appearance: alert, cooperative and no distress Head: Normocephalic, without obvious abnormality, atraumatic Neck: no adenopathy, no JVD, supple, symmetrical, trachea midline and thyroid not enlarged, symmetric, no tenderness/mass/nodules Lymph nodes: Cervical, supraclavicular, and axillary nodes normal. Resp: clear to auscultation bilaterally Back: symmetric, no curvature. ROM normal. No CVA tenderness. Cardio: regular rate and rhythm, S1, S2 normal, no murmur, click, rub or gallop GI: soft, non-tender; bowel sounds normal; no masses,  no organomegaly Extremities: extremities normal, atraumatic, no cyanosis or edema Neurologic: Alert and oriented X 3, normal strength and tone. Normal symmetric reflexes. Normal coordination and gait  ECOG PERFORMANCE STATUS: 1 - Symptomatic but completely ambulatory  Blood pressure 143/81, pulse 80, temperature 98 F (36.7 C), temperature source Oral, resp. rate 18, height 5' 5"  (1.651 m), weight 169 lb 4.8 oz (76.794 kg), SpO2 99 %.  LABORATORY DATA: Lab Results  Component Value Date   WBC 5.3  06/27/2015   HGB 13.8 06/27/2015   HCT 41.9 06/27/2015   MCV 89.4 06/27/2015   PLT 246 06/27/2015      Chemistry      Component Value Date/Time   NA 141 06/27/2015 1315   NA 142 08/08/2014 1439   K 4.1 06/27/2015 1315   K 3.9 08/08/2014 1439   CL 104 08/08/2014 1439   CO2 24 06/27/2015 1315   CO2 24 08/08/2014 1439   BUN 9.2 06/27/2015 1315   BUN 10 08/08/2014 1439   CREATININE 0.7 06/27/2015 1315   CREATININE 0.70 08/08/2014 1439   CREATININE 0.64 07/27/2014 0417      Component Value Date/Time   CALCIUM 9.3 06/27/2015 1315   CALCIUM 9.8 08/08/2014 1439   ALKPHOS 104 06/27/2015 1315   ALKPHOS 82 07/25/2014 0500   AST 19 06/27/2015 1315   AST 17 07/25/2014 0500   ALT 19 06/27/2015 1315   ALT 14 07/25/2014 0500   BILITOT 0.51 06/27/2015 1315   BILITOT 0.6 07/25/2014 0500       RADIOGRAPHIC STUDIES: Ct Head W Wo Contrast  06/27/2015  CLINICAL DATA:  Lung cancer.  Chemo and radiation in 2015.  Staging. EXAM: CT HEAD WITHOUT AND WITH CONTRAST TECHNIQUE: Contiguous axial images were obtained from the base of the skull through the vertex without and with intravenous contrast CONTRAST:  55m ISOVUE-300 IOPAMIDOL (ISOVUE-300) INJECTION 61% COMPARISON:  MR brain 01/12/2015. FINDINGS: No evidence for acute infarction, hemorrhage, mass lesion, hydrocephalus, or extra-axial fluid. Normal cerebral volume. No white matter disease is appreciated. Post infusion, no abnormal enhancement is present. No areas concerning for vasogenic edema or meningeal disease. Calvarium and skull base appear intact. Compared to prior MR, no changes appreciated, when technique differences are considered. IMPRESSION: No acute or focal intracranial abnormality. No abnormal enhancement to suggest metastatic disease. No osseous findings of significance. Electronically Signed   By: JStaci RighterM.D.   On: 06/27/2015 17:29   Ct Chest W Contrast  06/27/2015  CLINICAL DATA:  History of right breast cancer, status  post lobectomy and neoadjuvant chemo and radiation therapy. EXAM: CT CHEST WITH CONTRAST TECHNIQUE: Multidetector CT imaging of the chest was performed during intravenous contrast administration. CONTRAST:  710mISOVUE-300 IOPAMIDOL (ISOVUE-300) INJECTION 61% COMPARISON:  12/26/2014 FINDINGS: Mediastinum/Lymph Nodes: No masses, pathologically enlarged lymph nodes, or other significant abnormality. Atherosclerotic disease of the coronary arteries is seen. There is no evidence of pericardial effusion. Lungs/Pleura: No pulmonary mass, infiltrate, or effusion. Postoperative changes from prior right lower lobectomy are stable. There is mild residual right lower pleural thickening. There are stable changes of mild emphysema. Lingular scarring is also stable. Hypoventilatory changes in the left lung base are noted. Upper abdomen: No acute findings. 17 mm left subcapsular hepatic cyst is stable in appearance. Musculoskeletal: No chest wall mass or suspicious bone lesions identified. IMPRESSION: Stable postsurgical changes from right lower lobectomy, without evidence of recurrence. Coronary artery atherosclerotic disease. Stable left hepatic cyst. Electronically Signed   By: DoFidela Salisbury.D.   On: 06/27/2015 17:14    ASSESSMENT AND PLAN: This is a very pleasant [age] old white female with initial diagnosis of stage IIIa non-small cell lung cancer status post a course of concurrent chemoradiation with weekly carboplatin and paclitaxel followed by right lower lobectomy with lymph node dissection and the final pathology revealed 1.8 cm residual invasive adenocarcinoma with no evidence of metastatic disease to the dissected lymph nodes with the final pathologic stage of pT1a, pN0. She is currently on observation and feeling well except for the intermittent headache and the baseline shortness of breath. The recent CT scan of the head and chest showed no evidence for disease recurrence. I discussed the scan results  with the patient and recommended for her to continue on observation with repeat CT scan of the chest in 6 months. She was advised to call immediately if she has any concerning symptoms in the interval. The patient voices understanding of current disease status and treatment options and is in agreement with the current care plan.  All questions were answered. The patient knows to call the clinic with any problems, questions or concerns. We can certainly see the patient much sooner if necessary.  Disclaimer: This note was dictated with voice recognition software. Similar sounding words can inadvertently be transcribed and may not be corrected upon review.

## 2015-07-02 NOTE — Telephone Encounter (Signed)
Gv pt appts for 8/3 + 8/9.

## 2015-07-28 ENCOUNTER — Ambulatory Visit (INDEPENDENT_AMBULATORY_CARE_PROVIDER_SITE_OTHER): Payer: Managed Care, Other (non HMO) | Admitting: Emergency Medicine

## 2015-07-28 ENCOUNTER — Encounter: Payer: Self-pay | Admitting: Emergency Medicine

## 2015-07-28 VITALS — BP 128/72 | HR 91 | Ht 66.0 in | Wt 170.0 lb

## 2015-07-28 DIAGNOSIS — J441 Chronic obstructive pulmonary disease with (acute) exacerbation: Secondary | ICD-10-CM | POA: Diagnosis not present

## 2015-07-28 DIAGNOSIS — C3431 Malignant neoplasm of lower lobe, right bronchus or lung: Secondary | ICD-10-CM

## 2015-07-28 NOTE — Patient Instructions (Addendum)
Please continue to keep ProAir available to use as needed for shortness of breath.  We will not start any daily inhaled medication at this time.  Follow with Dr Julien Nordmann as planned.  Follow with Dr Lamonte Sakai in 12 months or sooner if you have any problems.

## 2015-07-28 NOTE — Assessment & Plan Note (Signed)
Gold stage B with moderate to severe obstruction on spirometry but minimal symptoms. Continue albuterol as needed and we will defer schedule bronchodilator at this time

## 2015-07-28 NOTE — Progress Notes (Signed)
Subjective:    Patient ID: [name] [name], female    DOB: Dec 22, 1955, 60 y.o.   MRN: [id number]  HPI 60 yo woman, former smoker (20 pk-yrs), hx of HTN, migraines. She is referred by Dr Amedeo Kinsman for chronic cough, hemoptysis. She has been given a dx of COPD in 2014. Spirometry was done > per pt shows AFL. Imaging has identified some L sided linear atx (CXR 02/2014).   The cough has been non-productive for most part, often paroxysmal, can cause emesis. Has waxed and waned. She has been treated with abx, with scheduled BD's. For about a week in August she experienced some hemoptysis, blood streaks in clear sputum.    She denies any real allergy sx. She tries loratadine on her own > no difference. Was started on flonase and seemed to help some. She does not have any heartburn or noticeable GERD  ROV 05/09/14 -- follow up visit after FOB and EBUS >> showed invasive adenoCA. She has started chemo and XRT. 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 Normal volumes, decrease DLCO.  She is feeling fairly well. Breathing has been better since she quit smoking. She does have some mild DOE, paces herself. She has had bronchitis in the Fall for several years. Her cough is better. She has been on both Symbicort and Spiriva in the past - no real difference.   ROV 02/12/15 -- follow-up visit for history of COPD and also adenocarcinoma the lung. She underwent neoadjuvant chemotherapy and radiation and then was able to have a VATS right lower lobectomy and mediastinal lymph node dissection with negative nodes. This was done by Dr. Darcey Nora in February. Her most recent surveillance CT scan of the chest done 12/26/14 and I have personally reviewed today.  There is no evidence of recurrence.  Next is scheduled for October.  She tells me that her her breathing is stable, although she admits to being fairly sedentary. Her cough has resolved. She has moderately severe AFL on her original Arlyce Harman from 05/2014.    ROV 07/28/15 -- patient has a history of COPD and adenocarcinoma of the lung. She has undergone VATS right lower lobectomy following neoadjuvant chemotherapy. She is currently on observation. Her last CT scan of the chest was on/20/17 which I personally reviewed and which shows no evidence of recurrent disease. She has been doing well, has not been exercising very much and feels out of shape, has put on about 20 lbs. She not having wheeze, minimal cough.    Review of Systems  Constitutional: Negative for fever and unexpected weight change.  HENT: Negative for congestion, dental problem, ear pain, nosebleeds, postnasal drip, rhinorrhea, sinus pressure, sneezing, sore throat and trouble swallowing.   Eyes: Negative for redness and itching.  Respiratory: Positive for cough and shortness of breath. Negative for chest tightness and wheezing.   Cardiovascular: Negative for palpitations and leg swelling.  Gastrointestinal: Negative for nausea and vomiting.  Genitourinary: Negative for dysuria.  Musculoskeletal: Negative for joint swelling.  Skin: Negative for rash.  Neurological: Negative for headaches.  Hematological: Does not bruise/bleed easily.  Psychiatric/Behavioral: Negative for dysphoric mood. The patient is not nervous/anxious.        Objective:   Physical Exam Filed Vitals:   07/28/15 1615  BP: 128/72  Pulse: 91  Height: '5\' 6"'$  (1.676 m)  Weight: 170 lb (77.111 kg)  SpO2: 97%   Gen: Pleasant, well-nourished, in no distress,  normal affect  ENT: No lesions,  mouth clear,  oropharynx clear, no postnasal drip  Lungs: No use of accessory muscles, clear without rales or rhonchi  Cardiovascular: RRR, heart sounds normal, no murmur or gallops, no peripheral edema  Musculoskeletal: No deformities, no cyanosis or clubbing  Neuro: alert, non focal  Skin: Warm, no lesions or rashes       Assessment & Plan:  Primary cancer of right lower lobe of lung Currently on observation  with a stable CT scan on 07/02/15. She will follow up with Dr. Earlie Server  COPD (chronic obstructive pulmonary disease) Gold stage B with moderate to severe obstruction on spirometry but minimal symptoms. Continue albuterol as needed and we will defer schedule bronchodilator at this time

## 2015-07-28 NOTE — Assessment & Plan Note (Signed)
Currently on observation with a stable CT scan on 07/02/15. She will follow up with Dr. Earlie Server

## 2015-10-06 ENCOUNTER — Ambulatory Visit (INDEPENDENT_AMBULATORY_CARE_PROVIDER_SITE_OTHER): Payer: Managed Care, Other (non HMO) | Admitting: Adult Health

## 2015-10-06 ENCOUNTER — Encounter: Payer: Self-pay | Admitting: Adult Health

## 2015-10-06 VITALS — BP 134/76 | HR 94 | Ht 66.0 in | Wt 172.0 lb

## 2015-10-06 DIAGNOSIS — J449 Chronic obstructive pulmonary disease, unspecified: Secondary | ICD-10-CM | POA: Diagnosis not present

## 2015-10-06 MED ORDER — AZITHROMYCIN 250 MG PO TABS: ORAL_TABLET | ORAL | Status: DC

## 2015-10-06 NOTE — Patient Instructions (Signed)
Mucinex DM Twice daily  As needed  Cough/congestion  Fluids and rest  Zpack take as directed.  Follow up Dr. Lamonte Sakai as planned and As needed   Please contact office for sooner follow up if symptoms do not improve or worsen or seek emergency care

## 2015-10-06 NOTE — Assessment & Plan Note (Signed)
Flare with URI   Plan  Mucinex DM Twice daily  As needed  Cough/congestion  Fluids and rest  Zpack take as directed.  Follow up Dr. Lamonte Sakai as planned and As needed   Please contact office for sooner follow up if symptoms do not improve or worsen or seek emergency care

## 2015-10-06 NOTE — Progress Notes (Signed)
Subjective:    Patient ID: [name] [name], female    DOB: Sep 09, 1955, 60 y.o.   MRN: [id number]  HPI 59 with COPD and adenocarcinoma of the lung (s/p RLL lobectomy, chemo/xrt)   10/06/2015 Acute OV : COPD Pt presents for an acute office visit.  Complains of 1 week of sore throat, chills, sinus congestion, chest congestion, prod cough with occ yellow mucus, and SOB with activity. Denies any fever, nausea or vomiting, hemoptysis, or chest pain or edema.  CT chest 06/2015 stable post surgical changes , without evidence of recurrence.  Taking otc cough meds with only minimal improvement.     Past Medical History  Diagnosis Date  . Visual disturbance     Episodic  . History of migraine   . Thyroid nodule     benign by biopsy  . Varicose veins     left leg  . Vitamin D deficiency   . Shortness of breath dyspnea     with exertion  . Wears glasses   . COPD (chronic obstructive pulmonary disease) ([name])   . GERD (gastroesophageal reflux disease)   . Cancer ([name])     lung  . PVC (premature ventricular contraction)   . Anxiety     husband also cancer pt  . Complication of anesthesia     bronchospasms post-op (pulmonary did consult but pt was discharged later that day)  With last surg. she was given albuterol inhaler & had steroid with surg.    [name] Kitchen PONV (postoperative nausea and vomiting)   . Hypertension     h/o PVC- asymptomatic   . Congenital renal atrophy     left kidney, one spontaneous stone passed   . 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 Degenerative arthritis     spine, hands & knees     Current Outpatient Prescriptions on File Prior to Visit  Medication Sig Dispense Refill  . PROAIR HFA 108 (90 BASE) MCG/ACT inhaler Inhale 2 puffs into the lungs every 4 (four) hours as needed.    . verapamil (CALAN-SR) 180 MG CR tablet Take 180 mg by mouth at bedtime.    . Biotin 5000 MCG CAPS Take by mouth  daily. Reported on 10/06/2015    . calcium carbonate (OSCAL) 1500 (600 Ca) MG TABS tablet Take 600 mg of elemental calcium by mouth daily with breakfast. Reported on 10/06/2015    . Multiple Vitamin (MULTI VITAMIN DAILY) TABS Take by mouth daily. Reported on 10/06/2015    . vitamin B-12 (CYANOCOBALAMIN) 1000 MCG tablet Take 1,000 mcg by mouth daily. Reported on 10/06/2015    . Vitamin D, Ergocalciferol, (DRISDOL) 50000 UNITS CAPS capsule Take 50,000 Units by mouth every 7 (seven) days. Reported on 10/06/2015  1   No current facility-administered medications on file prior to visit.      Review of Systems  Constitutional:   No  weight loss, night sweats,  Fevers, chills,  +fatigue, or  lassitude.  HEENT:   No headaches,  Difficulty swallowing,  Tooth/dental problems, or  Sore throat,                No sneezing, itching, ear ache,  +nasal congestion, post nasal drip,   CV:  No chest pain,  Orthopnea, PND, swelling in lower extremities, anasarca, dizziness, palpitations, syncope.   GI  No heartburn, indigestion, abdominal pain, nausea, vomiting, diarrhea, change in bowel habits, loss of  appetite, bloody stools.   Resp:.  No chest wall deformity  Skin: no rash or lesions.  GU: no dysuria, change in color of urine, no urgency or frequency.  No flank pain, no hematuria   MS:  No joint pain or swelling.  No decreased range of motion.  No back pain.  Psych:  No change in mood or affect. No depression or anxiety.  No memory loss.         Objective:   Physical Exam  Filed Vitals:   10/06/15 1619  BP: 134/76  Pulse: 94  Height: '5\' 6"'$  (1.676 m)  Weight: 172 lb (78.019 kg)  SpO2: 98%    GEN: A/Ox3; pleasant , NAD+ cough  VS reviewed   HEENT:  [name]/AT,  EACs-clear, TMs-wnl, NOSE-clear drainage , THROAT-clear, no lesions, no postnasal drip or exudate noted.   NECK:  Supple w/ fair ROM; no JVD; normal carotid impulses w/o bruits; no thyromegaly or nodules palpated; no  lymphadenopathy.  RESP  Clear  P & A; w/o, wheezes/ rales/ or rhonchi.no accessory muscle use, no dullness to percussion  CARD:  RRR, no m/r/g  , no peripheral edema, pulses intact, no cyanosis or clubbing.  GI:   Soft & nt; nml bowel sounds; no organomegaly or masses detected.  Musco: Warm bil, no deformities or joint swelling noted.   Neuro: alert, no focal deficits noted.    Skin: Warm, no lesions or rashes  [name] NP-C  [facility]  10/06/2015        Assessment & Plan:

## 2015-10-10 ENCOUNTER — Telehealth: Payer: Self-pay | Admitting: Adult Health

## 2015-10-10 MED ORDER — AZITHROMYCIN 250 MG PO TABS
250.0000 mg | ORAL_TABLET | Freq: Every day | ORAL | Status: AC
Start: 2015-10-10 — End: 2015-10-15

## 2015-10-10 MED ORDER — AZITHROMYCIN 250 MG PO TABS: 250.0000 mg | ORAL_TABLET | Freq: Every day | ORAL | Status: DC

## 2015-10-10 NOTE — Telephone Encounter (Signed)
Spoke with pt.  Reports still having issues with coughing with production of yellow mucus and SOB. Denies chest tightness, wheezing or fever. Is on day 5 of Zpak and is taking Mucinex DM with minimal relief. Pt is leaving to go out of town on Monday and would like more antibiotics to clear everything up.  TP - please advise.  Thanks.

## 2015-10-10 NOTE — Telephone Encounter (Signed)
Extend zithromax '250mg'$  daily for 5 days #5  No refills  Please contact office for sooner follow up if symptoms do not improve or worsen or seek emergency care  Cont on mucinex /saline and rest and fluids  Please contact office for sooner follow up if symptoms do not improve or worsen or seek emergency care

## 2015-10-10 NOTE — Telephone Encounter (Signed)
Patient called returning our call. She can be reached on her cell [phone]

## 2015-10-10 NOTE — Telephone Encounter (Signed)
Spoke with pt and gave TP's recommendations. Pt will call if not better after abx. Rx sent. Nothing further needed.

## 2015-10-10 NOTE — Telephone Encounter (Signed)
Previous message closed by mistake.  Melvenia Needles, NP at 10/10/2015 12:37 PM     Status: Signed       Expand All Collapse All   Extend zithromax '250mg'$  daily for 5 days #5  No refills  Please contact office for sooner follow up if symptoms do not improve or worsen or seek emergency care  Cont on mucinex /saline and rest and fluids  Please contact office for sooner follow up if symptoms do not improve or worsen or seek emergency care              Randa Spike, [place] at 10/10/2015 12:26 PM     Status: Signed       Expand All Collapse All   Spoke with pt.  Reports still having issues with coughing with production of yellow mucus and SOB. Denies chest tightness, wheezing or fever. Is on day 5 of Zpak and is taking Mucinex DM with minimal relief. Pt is leaving to go out of town on Monday and would like more antibiotics to clear everything up.  TP - please advise. Thanks.

## 2015-10-10 NOTE — Telephone Encounter (Signed)
lmtcb x1 for pt. 

## 2015-10-23 ENCOUNTER — Ambulatory Visit (INDEPENDENT_AMBULATORY_CARE_PROVIDER_SITE_OTHER): Payer: Managed Care, Other (non HMO) | Admitting: Adult Health

## 2015-10-23 ENCOUNTER — Encounter: Payer: Self-pay | Admitting: Adult Health

## 2015-10-23 ENCOUNTER — Ambulatory Visit (INDEPENDENT_AMBULATORY_CARE_PROVIDER_SITE_OTHER)
Admission: RE | Admit: 2015-10-23 | Discharge: 2015-10-23 | Disposition: A | Discharge status: Home / Self Care | Payer: Managed Care, Other (non HMO) | Source: Ambulatory Visit | Attending: Adult Health | Admitting: Adult Health

## 2015-10-23 VITALS — BP 122/70 | HR 90 | Ht 66.0 in | Wt 170.0 lb

## 2015-10-23 DIAGNOSIS — J449 Chronic obstructive pulmonary disease, unspecified: Secondary | ICD-10-CM | POA: Diagnosis not present

## 2015-10-23 MED ORDER — BENZONATATE 200 MG PO CAPS: 200.0000 mg | ORAL_CAPSULE | Freq: Three times a day (TID) | ORAL | Status: DC | PRN

## 2015-10-23 MED ORDER — PREDNISONE 10 MG PO TABS: ORAL_TABLET | ORAL | Status: DC

## 2015-10-23 MED ORDER — BUDESONIDE-FORMOTEROL FUMARATE 80-4.5 MCG/ACT IN AERO: 2.0000 | INHALATION_SPRAY | Freq: Two times a day (BID) | RESPIRATORY_TRACT | Status: DC

## 2015-10-23 NOTE — Progress Notes (Signed)
Subjective:    Patient ID: [name] [name], female    DOB: 09/03/1955, 60 y.o.   MRN: [id number]  HPI 54 with COPD and adenocarcinoma of the lung (s/p RLL lobectomy, chemo/xrt)   10/23/2015 Acute OV : COPD Pt presents for an acute office visit for persistent symptoms  She complains over last 3  weeks of cough .  She was tx on 5/1 for COPD flare with ZPack . Called in on day 5 with lingering sx . Zithromax was extended for 5 days. Says dyspnea is at baseline. Main complaint is severe coughing fits.  Mainly dry but does have clear mucus. Mucus was dark yellow.   Denies any fever, nausea or vomiting, hemoptysis, or chest pain or edema.  CT chest 06/2015 stable post surgical changes , without evidence of recurrence.  Using mucinex dm .     Past Medical History  Diagnosis Date  . Visual disturbance     Episodic  . History of migraine   . Thyroid nodule     benign by biopsy  . Varicose veins     left leg  . Vitamin D deficiency   . Shortness of breath dyspnea     with exertion  . Wears glasses   . COPD (chronic obstructive pulmonary disease) ([place])   . GERD (gastroesophageal reflux disease)   . Cancer ([place])     lung  . PVC (premature ventricular contraction)   . Anxiety     husband also cancer pt  . Complication of anesthesia     bronchospasms post-op (pulmonary did consult but pt was discharged later that day)  With last surg. she was given albuterol inhaler & had steroid with surg.    [name] Kitchen PONV (postoperative nausea and vomiting)   . Hypertension     h/o PVC- asymptomatic   . Congenital renal atrophy     left kidney, one spontaneous stone passed   . 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 Degenerative arthritis     spine, hands & knees     Current Outpatient Prescriptions on File Prior to Visit  Medication Sig Dispense Refill  . PROAIR HFA 108 (90 BASE) MCG/ACT inhaler Inhale 2 puffs into the  lungs every 4 (four) hours as needed.    . verapamil (CALAN-SR) 180 MG CR tablet Take 180 mg by mouth at bedtime.    . Biotin 5000 MCG CAPS Take by mouth daily. Reported on 10/23/2015    . calcium carbonate (OSCAL) 1500 (600 Ca) MG TABS tablet Take 600 mg of elemental calcium by mouth daily with breakfast. Reported on 10/23/2015    . Multiple Vitamin (MULTI VITAMIN DAILY) TABS Take by mouth daily. Reported on 10/23/2015    . vitamin B-12 (CYANOCOBALAMIN) 1000 MCG tablet Take 1,000 mcg by mouth daily. Reported on 10/23/2015    . Vitamin D, Ergocalciferol, (DRISDOL) 50000 UNITS CAPS capsule Take 50,000 Units by mouth every 7 (seven) days. Reported on 10/23/2015  1   No current facility-administered medications on file prior to visit.      Review of Systems  Constitutional:   No  weight loss, night sweats,  Fevers, chills,  +fatigue, or  lassitude.  HEENT:   No headaches,  Difficulty swallowing,  Tooth/dental problems, or  Sore throat,                No sneezing, itching, ear ache,  +nasal congestion,  post nasal drip,   CV:  No chest pain,  Orthopnea, PND, swelling in lower extremities, anasarca, dizziness, palpitations, syncope.   GI  No heartburn, indigestion, abdominal pain, nausea, vomiting, diarrhea, change in bowel habits, loss of appetite, bloody stools.   Resp:.  No chest wall deformity  Skin: no rash or lesions.  GU: no dysuria, change in color of urine, no urgency or frequency.  No flank pain, no hematuria   MS:  No joint pain or swelling.  No decreased range of motion.  No back pain.  Psych:  No change in mood or affect. No depression or anxiety.  No memory loss.         Objective:   Physical Exam  Filed Vitals:   10/23/15 1603  BP: 122/70  Pulse: 90  Height: '5\' 6"'$  (1.676 m)  Weight: 170 lb (77.111 kg)  SpO2: 97%    GEN: A/Ox3; pleasant , NAD+ cough  VS reviewed   HEENT:  [place]/AT,  EACs-clear, TMs-wnl, NOSE-clear drainage , THROAT-clear, no lesions, no  postnasal drip or exudate noted.   NECK:  Supple w/ fair ROM; no JVD; normal carotid impulses w/o bruits; no thyromegaly or nodules palpated; no lymphadenopathy.  RESP  Clear  P & A; w/o, wheezes/ rales/ or rhonchi.no accessory muscle use, no dullness to percussion  CARD:  RRR, no m/r/g  , no peripheral edema, pulses intact, no cyanosis or clubbing.  GI:   Soft & nt; nml bowel sounds; no organomegaly or masses detected.  Musco: Warm bil, no deformities or joint swelling noted.   Neuro: alert, no focal deficits noted.    Skin: Warm, no lesions or rashes  Tammy Parrett NP-C  [facility]  10/23/2015        Assessment & Plan:

## 2015-10-23 NOTE — Patient Instructions (Signed)
Prednisone taper over next week.  Begin Symbicort 80/4.48mg 2 puffs Twice daily  , rinse after use.  Delsym 2 tsp Twice daily  , rinse after use.  Mucinex Twice daily  As needed  Cough/congestion  Tessalon Three times a day  As needed  Cough.  Chest xray today .  Follow up 6 weeks with Dr. BLamonte Sakai And As needed   Please contact office for sooner follow up if symptoms do not improve or worsen or seek emergency care

## 2015-10-24 ENCOUNTER — Telehealth: Payer: Self-pay | Admitting: Adult Health

## 2015-10-24 NOTE — Telephone Encounter (Signed)
Spoke with the pt  She is requesting cxr results from today  I advised TP still seeing pt's  Please advise thanks!

## 2015-10-24 NOTE — Telephone Encounter (Signed)
Pt was not available at the time of my call so Jeff-her husband took the results and will relay to patient. DPR on file to speak with husband.

## 2015-10-24 NOTE — Telephone Encounter (Signed)
No sign of PNA , previous right pleural effusion that was present along from RLL lobectomy is smaller.  Cont w/ ov recs  Please contact office for sooner follow up if symptoms do not improve or worsen or seek emergency care  See cxr results

## 2015-10-27 NOTE — Assessment & Plan Note (Signed)
Slow to resolve exacerbation   Plan  Prednisone taper over next week.  Begin Symbicort 80/4.5mg 2 puffs Twice daily  , rinse after use.  Delsym 2 tsp Twice daily  , rinse after use.  Mucinex Twice daily  As needed  Cough/congestion  Tessalon Three times a day  As needed  Cough.  Chest xray today .  Follow up 6 weeks with Dr. BLamonte Sakai And As needed   Please contact office for sooner follow up if symptoms do not improve or worsen or seek emergency care

## 2015-11-11 IMAGING — DX DG CHEST 2V
2 series · 2 of 2 positions shown · non-contrast
Comparison: 07/25/2014.

CLINICAL DATA: Right chest tubes.

EXAM:
CHEST  2 VIEW

[chest lat]
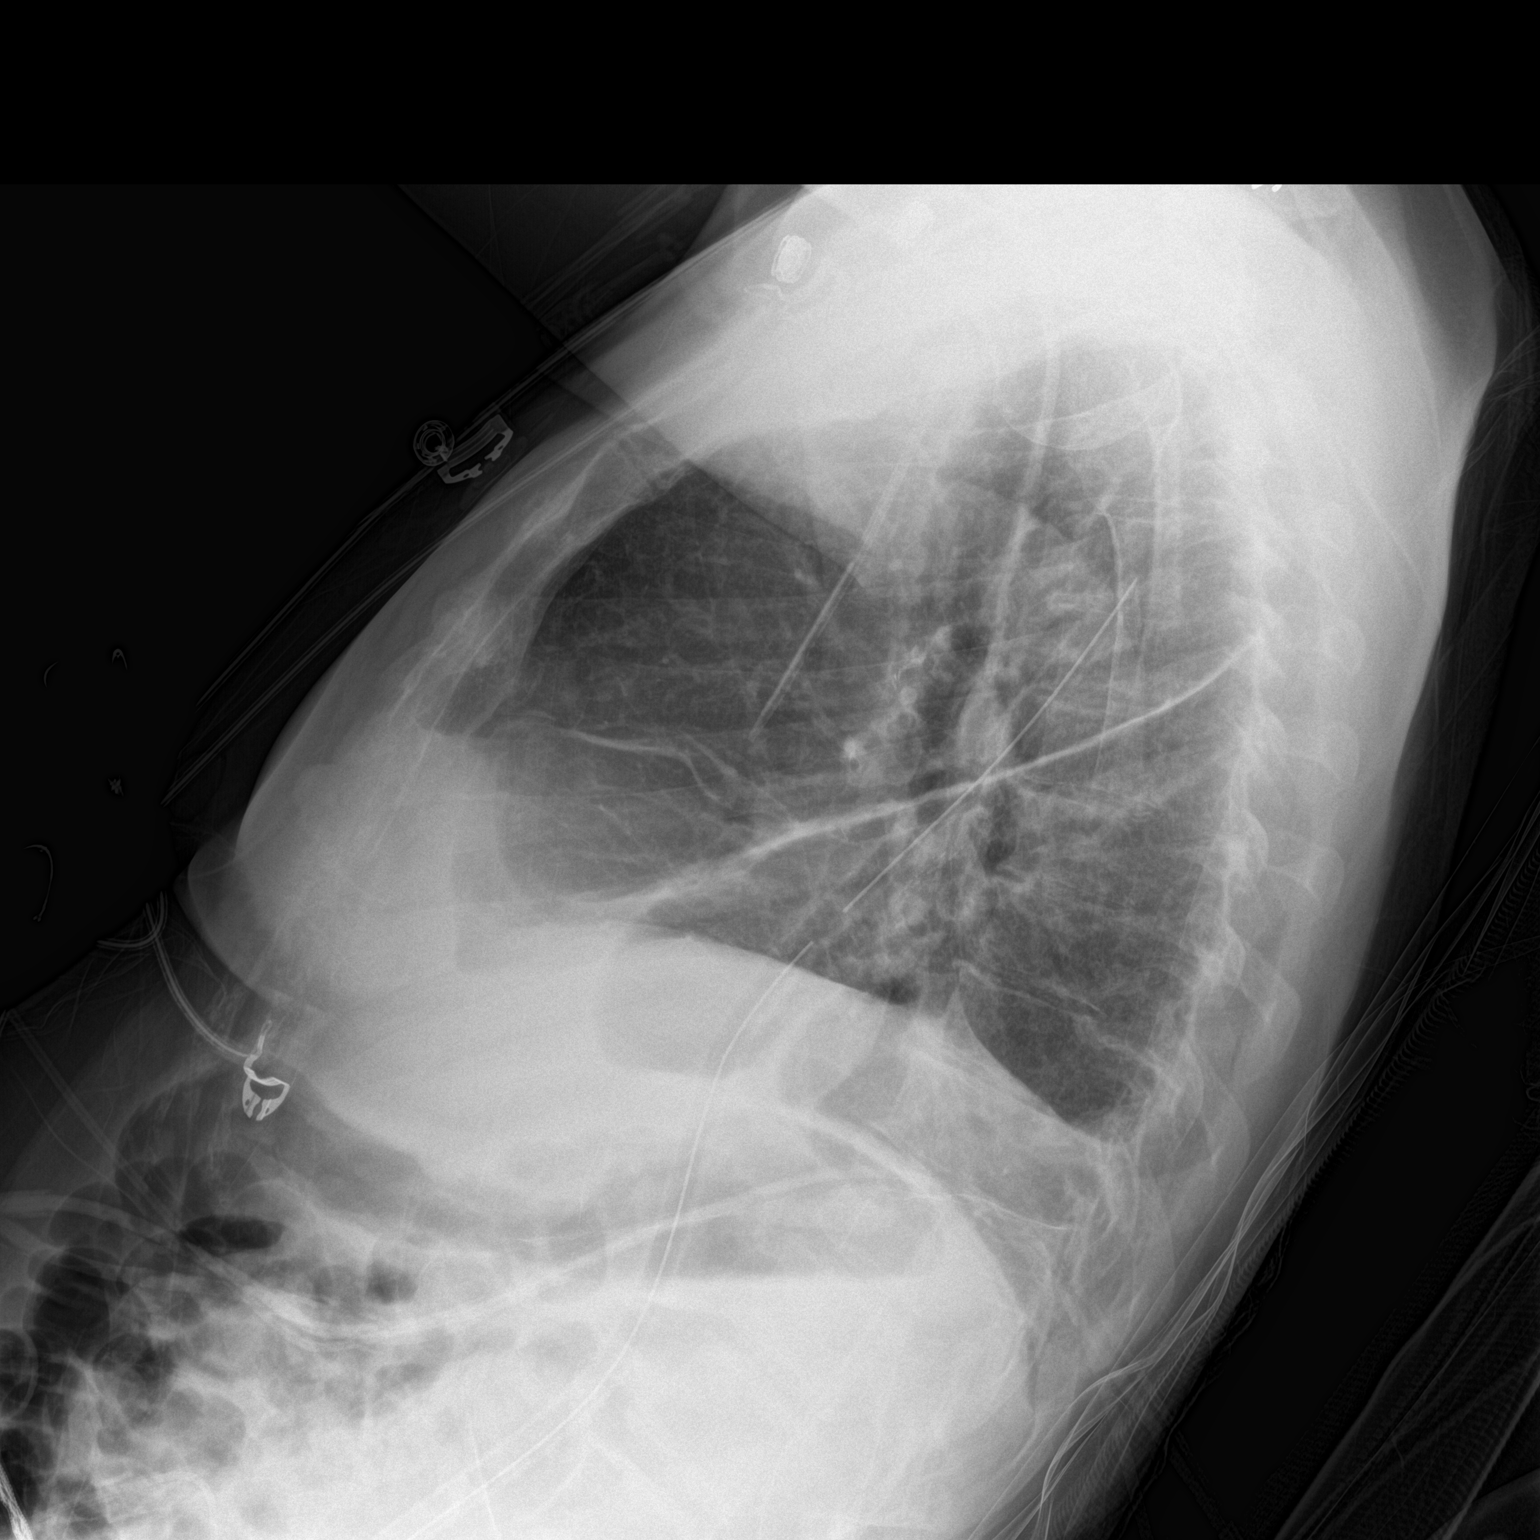

[chest ap]
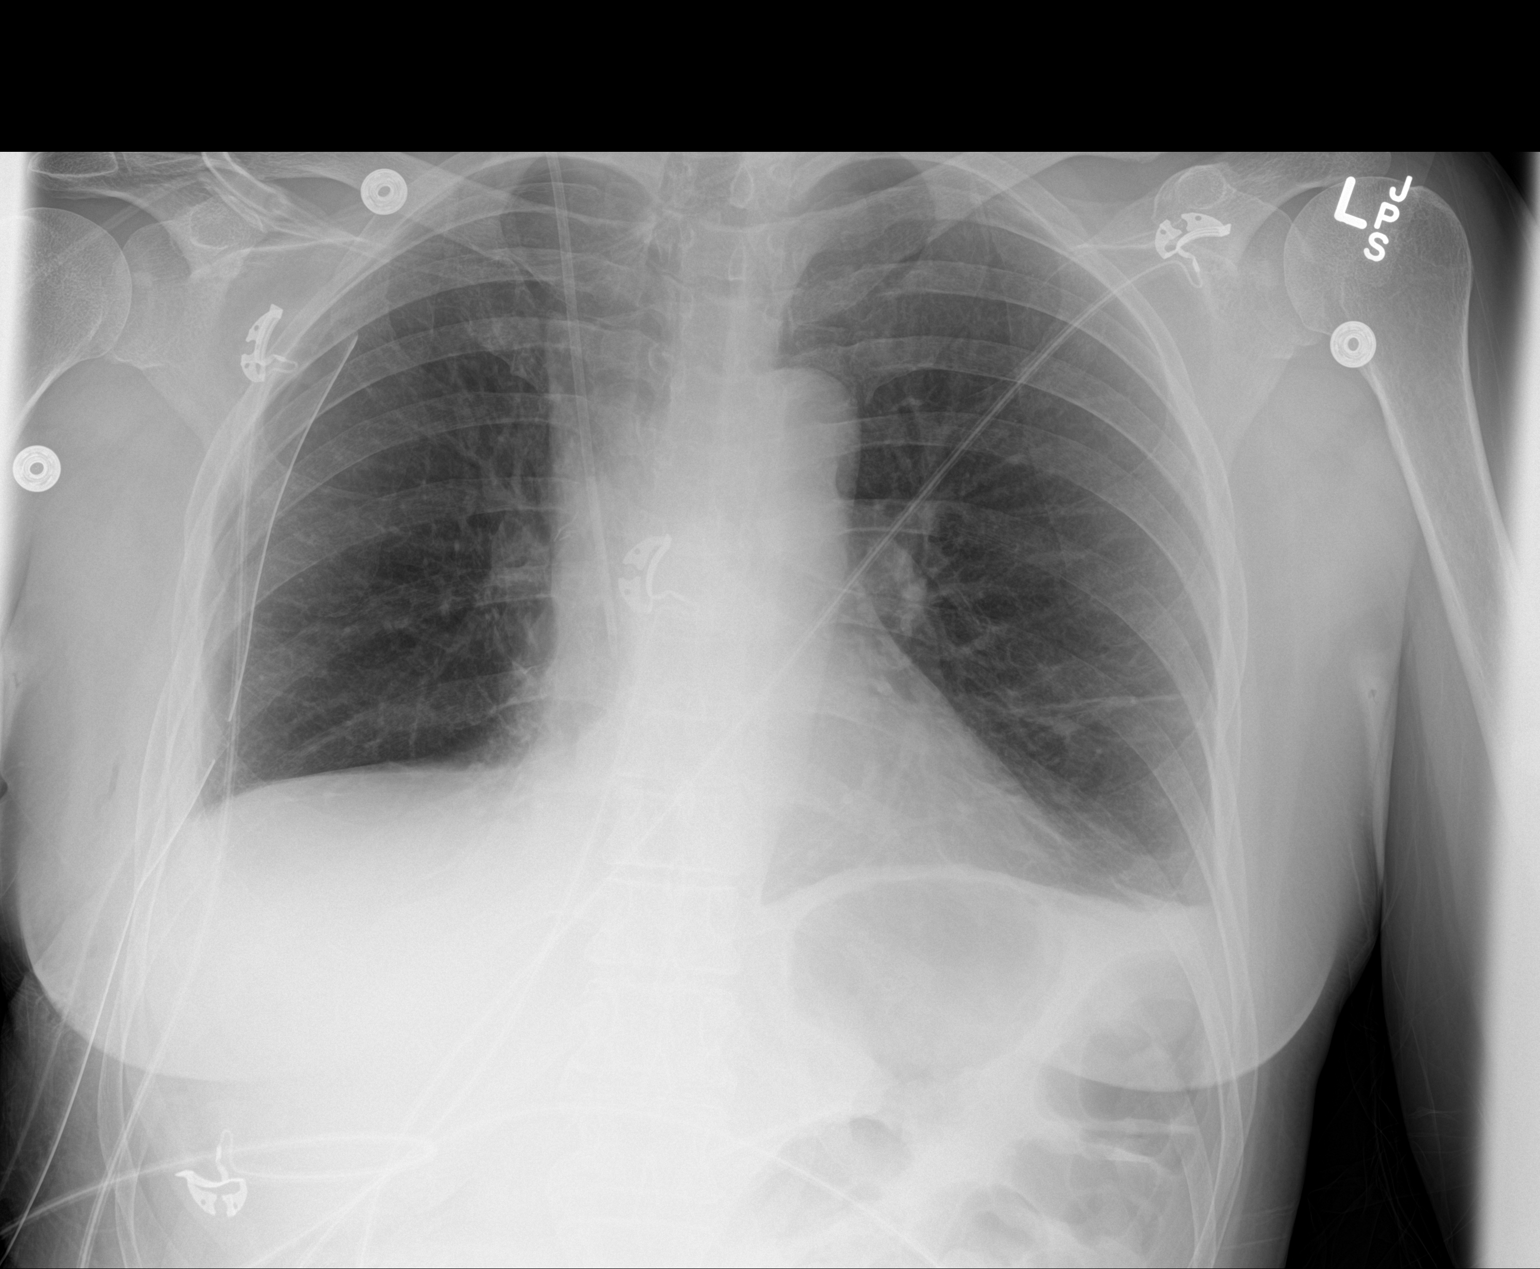

[2 of 2 positions shown; findings below may reference images not displayed]

FINDINGS: Two right chest tubes again noted. Lower most chest tube has been
repositioned into the upper chest. Right IJ line in stable position.
Mediastinum and hilar structures are stable. Heart size stable.
Improving left base subsegmental atelectasis and or infiltrate and
improving left pleural effusion. Tiny right apical pneumothorax. No
acute bony abnormality. Mild subcutaneous emphysema right chest
wall.
IMPRESSION: 1. Repositioning of the lowermost chest tube. This chest tube is now
in the upper chest cavity. Second chest tube in stable position.
Mild right subcutaneous emphysema . tiny right apical pneumothorax.
2. Right IJ line in stable position.
3. Partial clearing of left base atelectasis and/or infiltrate and
left pleural effusion.
These results were called by telephone at the time of interpretation
on 07/26/2014 at [DATE] to Nurse Yordan who verbally acknowledged
these results.

## 2015-11-12 IMAGING — CR DG CHEST 1V PORT
1 series · 1 of 1 positions shown · non-contrast
Comparison: None.

CLINICAL DATA: 58-year-old female with a history of right-sided
chest tube removal.

EXAM:
PORTABLE CHEST - 1 VIEW

[AP]
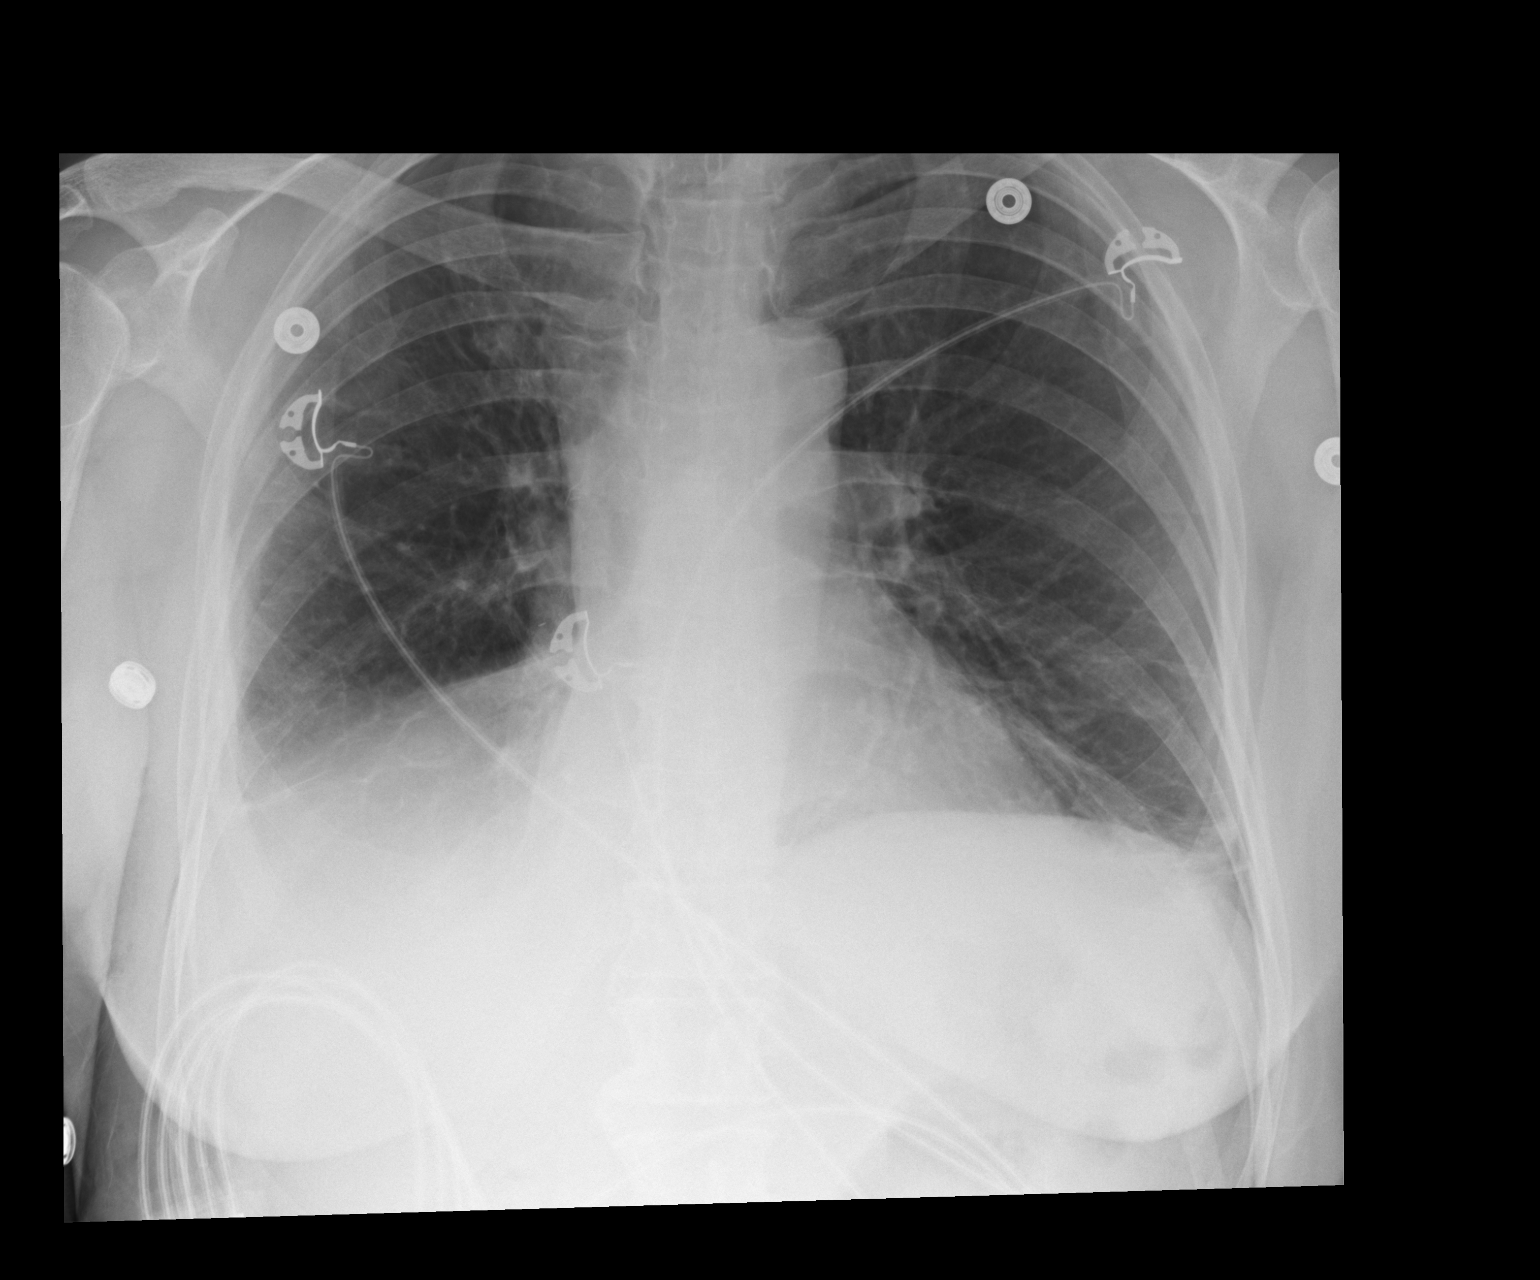

[1 of 1 positions shown; findings below may reference images not displayed]

FINDINGS: Cardiomediastinal silhouette unchanged in size and contour. Right
heart border partially obscured by overlying lung/pleural disease.

Interval removal of the lateral right thoracostomy tube. There is a
trace pneumothorax at the apex.

Persisting opacity at the right base with obscuration of the
hemidiaphragm.

Aeration on the left is maintained with linear opacities at the
base.
IMPRESSION: Interval removal of right-sided lateral thoracostomy tube, with tiny
pneumothorax at the apex.

Small persistent pleural fluid at the right base, with likely
associated atelectasis.

## 2015-11-13 IMAGING — CR DG CHEST 2V
2 series · 2 of 2 positions shown · non-contrast
Comparison: 07/27/2014

CLINICAL DATA: Lung cancer, hypertension, former smoker, post VATS
and lobectomy on 07/23/2014

EXAM:
CHEST  2 VIEW

[w chest pa]
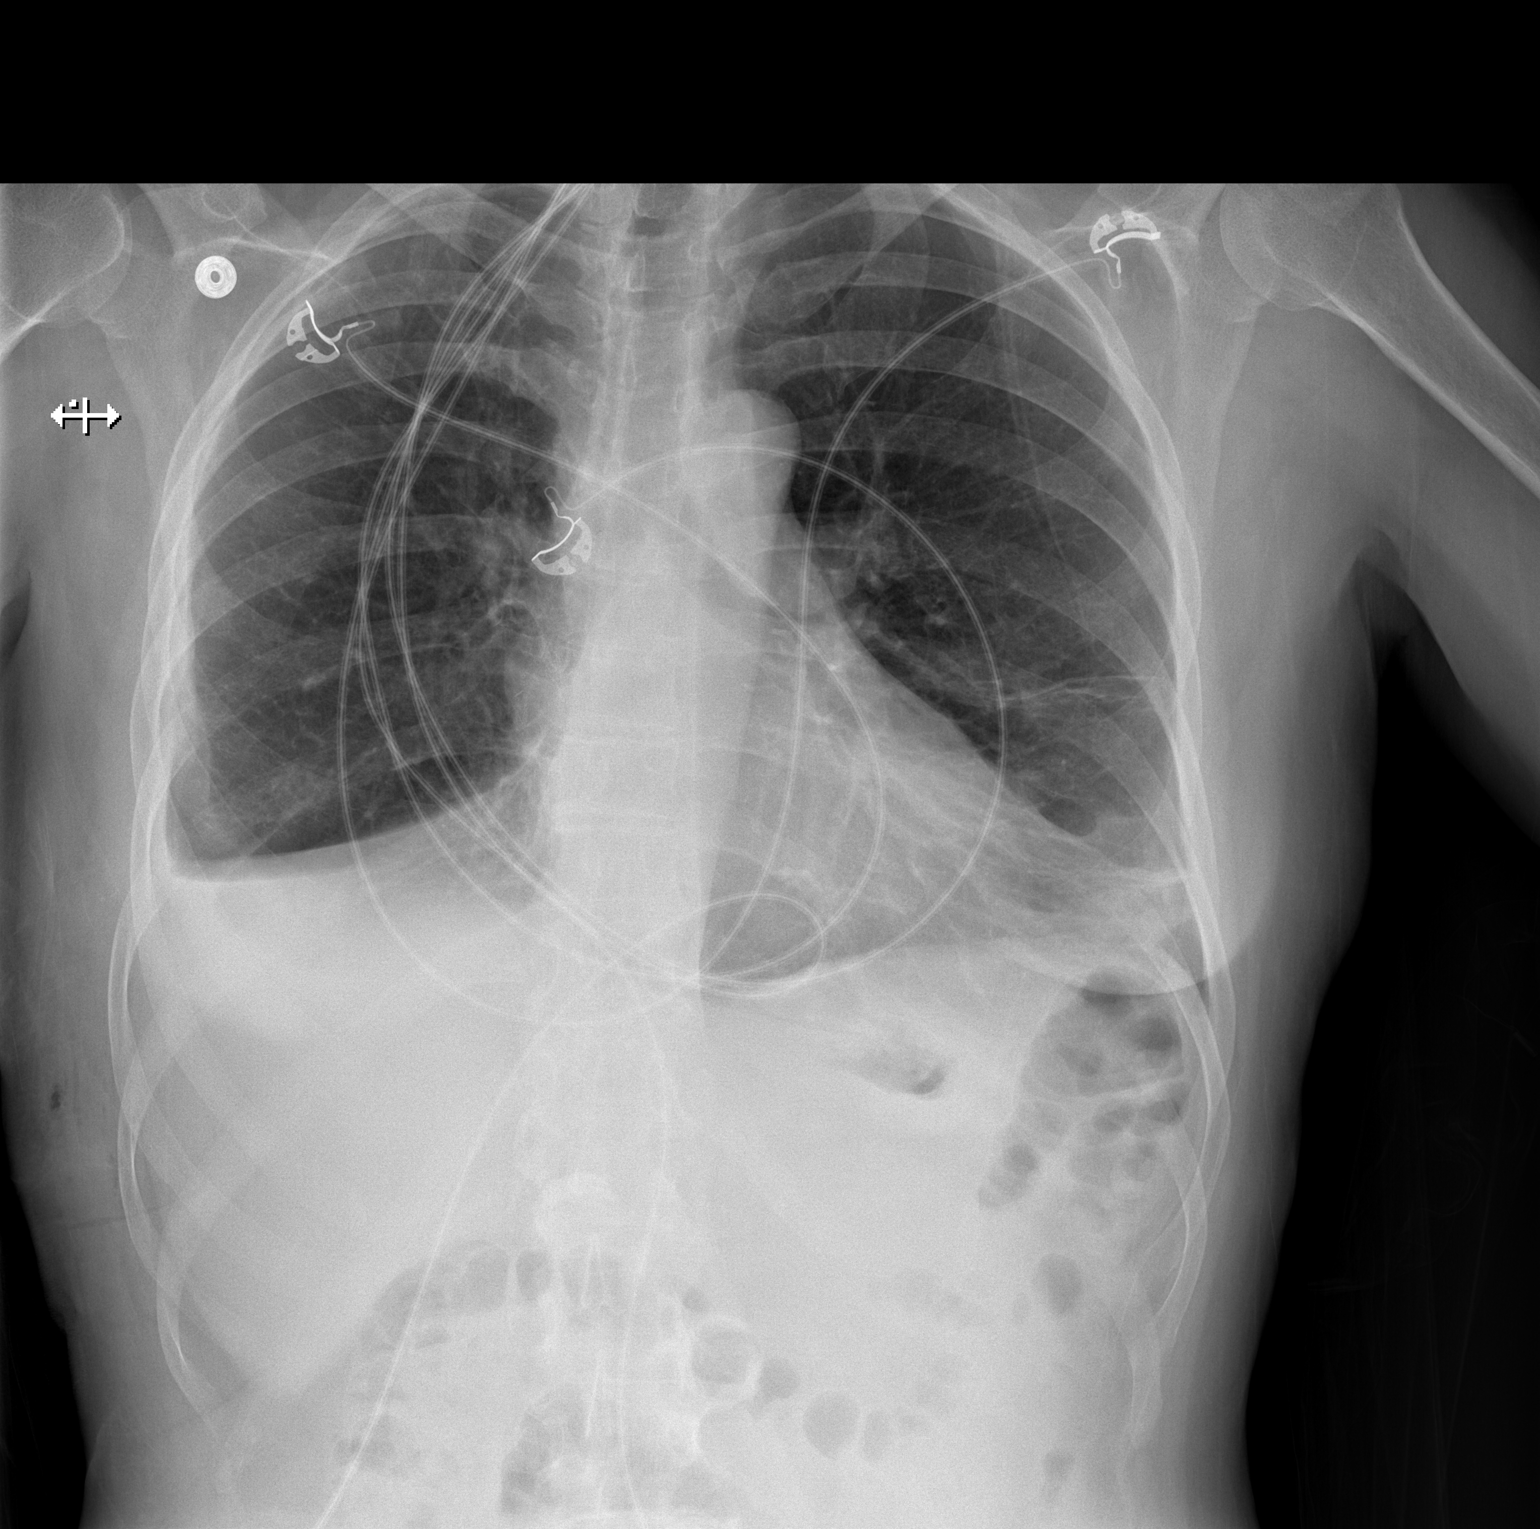

[w chest lat]
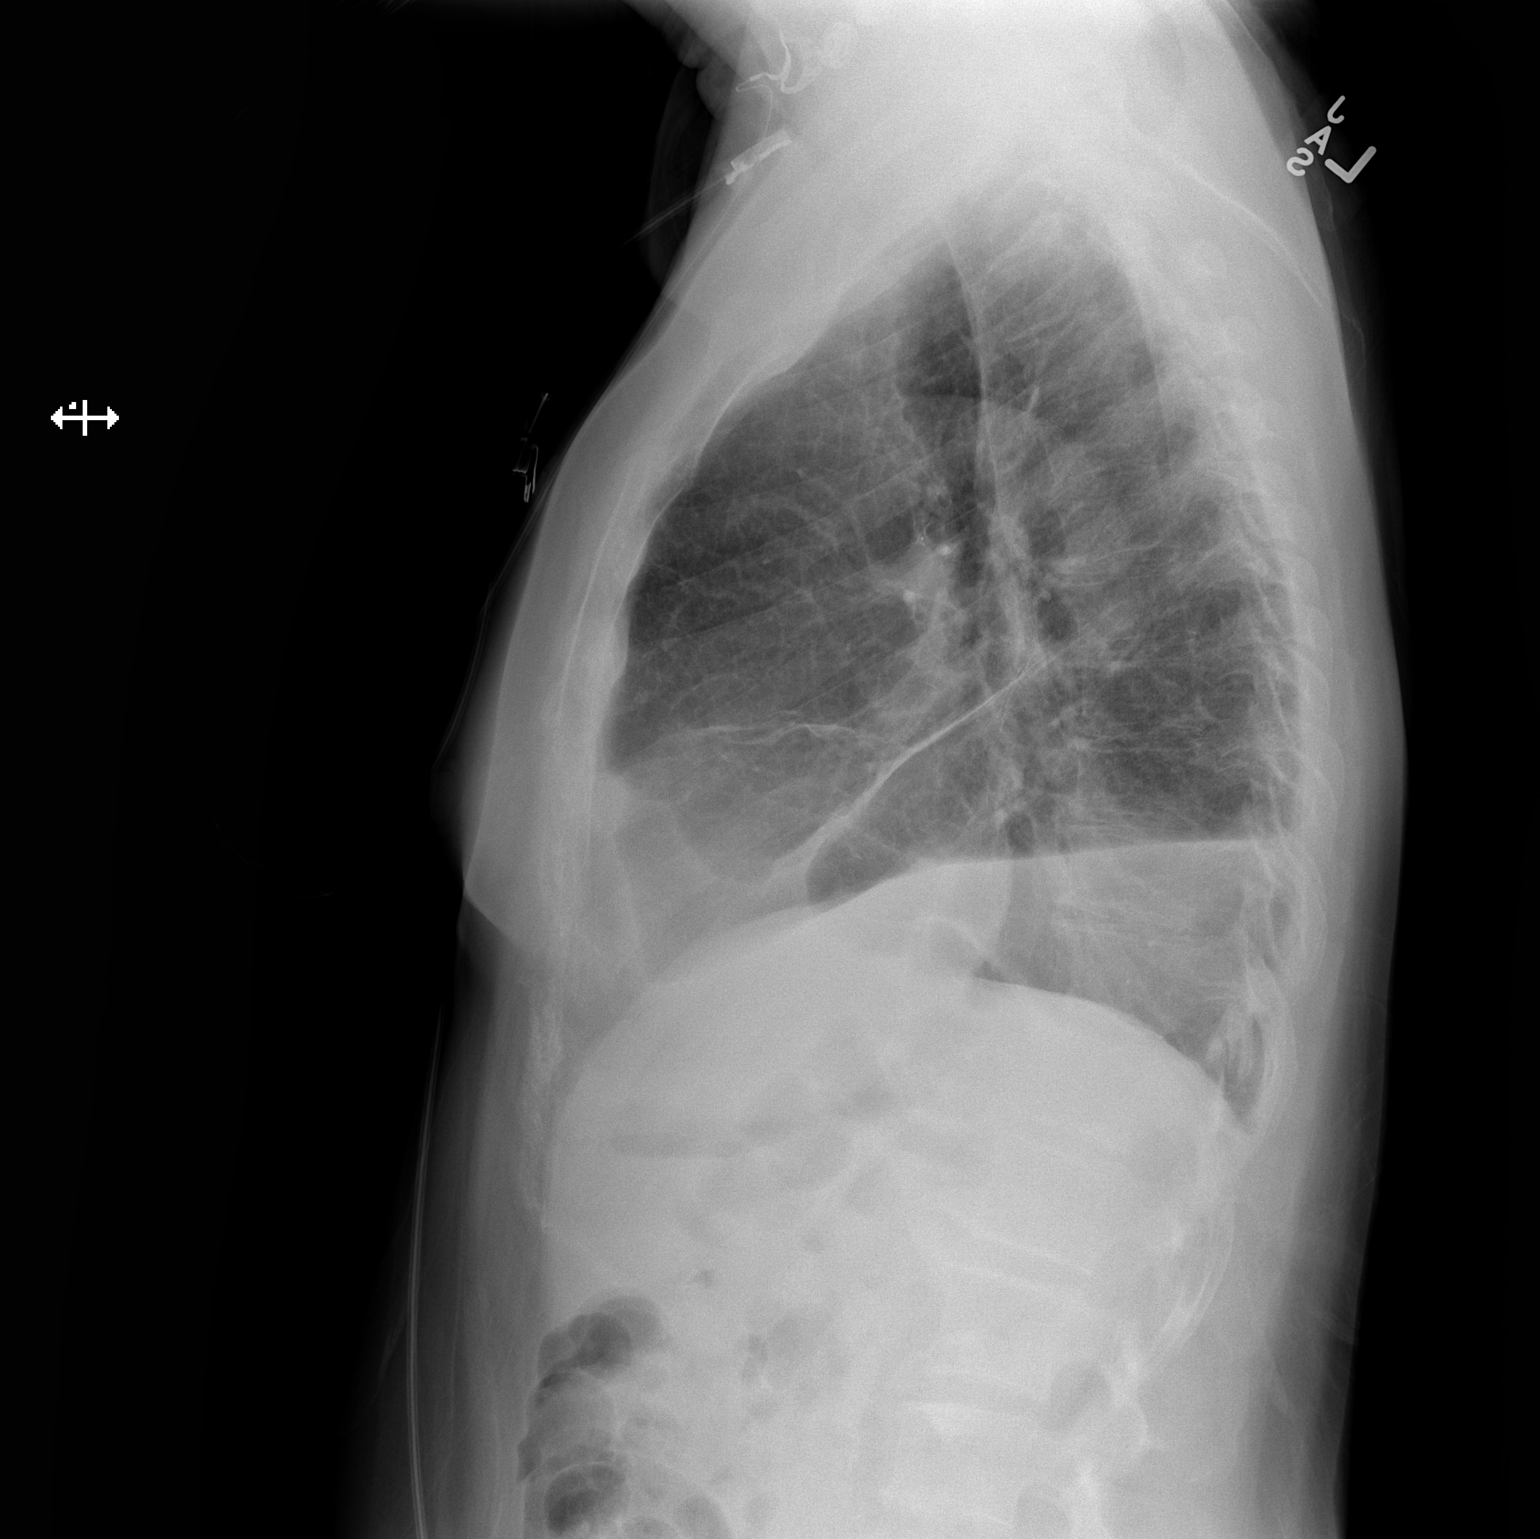

[2 of 2 positions shown; findings below may reference images not displayed]

FINDINGS: Upper normal heart size.

Normal mediastinal contours and pulmonary vascularity.

Persistent RIGHT pleural effusion and basilar atelectasis.

Subsegmental atelectasis at LEFT base.

Underlying emphysematous changes.

Tiny RIGHT apex pneumothorax little changed.

No acute osseous findings.
IMPRESSION: Persistent RIGHT hydropneumothorax with basilar atelectasis
unchanged.

Subsegmental atelectasis at LEFT base.

## 2015-12-04 ENCOUNTER — Ambulatory Visit: Payer: Managed Care, Other (non HMO) | Admitting: Emergency Medicine

## 2016-01-06 ENCOUNTER — Other Ambulatory Visit: Payer: Self-pay | Admitting: Emergency Medicine

## 2016-01-08 ENCOUNTER — Ambulatory Visit
Admission: RE | Admit: 2016-01-08 | Discharge: 2016-01-08 | Disposition: A | Discharge status: Home / Self Care | Payer: Managed Care, Other (non HMO) | Source: Ambulatory Visit | Attending: Internal Medicine | Admitting: Internal Medicine

## 2016-01-08 ENCOUNTER — Other Ambulatory Visit (HOSPITAL_BASED_OUTPATIENT_CLINIC_OR_DEPARTMENT_OTHER): Payer: Managed Care, Other (non HMO)

## 2016-01-08 ENCOUNTER — Other Ambulatory Visit: Payer: Managed Care, Other (non HMO)

## 2016-01-08 DIAGNOSIS — Z85118 Personal history of other malignant neoplasm of bronchus and lung: Secondary | ICD-10-CM

## 2016-01-08 DIAGNOSIS — C3431 Malignant neoplasm of lower lobe, right bronchus or lung: Secondary | ICD-10-CM

## 2016-01-08 LAB — CBC WITH DIFFERENTIAL/PLATELET
BASO%: 0.5 % (ref 0.0–2.0)
BASOS ABS: 0 10*3/uL (ref 0.0–0.1)
EOS%: 3.2 % (ref 0.0–7.0)
Eosinophils Absolute: 0.2 10*3/uL (ref 0.0–0.5)
HEMATOCRIT: 43.1 % (ref 34.8–46.6)
HGB: 14.1 g/dL (ref 11.6–15.9)
LYMPH#: 1.5 10*3/uL (ref 0.9–3.3)
LYMPH%: 25.8 % (ref 14.0–49.7)
MCH: 29.6 pg (ref 25.1–34.0)
MCHC: 32.7 g/dL (ref 31.5–36.0)
MCV: 90.4 fL (ref 79.5–101.0)
MONO#: 0.6 10*3/uL (ref 0.1–0.9)
MONO%: 10.1 % (ref 0.0–14.0)
NEUT#: 3.6 10*3/uL (ref 1.5–6.5)
NEUT%: 60.4 % (ref 38.4–76.8)
Platelets: 243 10*3/uL (ref 145–400)
RBC: 4.77 10*6/uL (ref 3.70–5.45)
RDW: 13.5 % (ref 11.2–14.5)
WBC: 5.9 10*3/uL (ref 3.9–10.3)

## 2016-01-08 LAB — COMPREHENSIVE METABOLIC PANEL
ALT: 17 U/L (ref 0–55)
AST: 16 U/L (ref 5–34)
Albumin: 3.7 g/dL (ref 3.5–5.0)
Alkaline Phosphatase: 136 U/L (ref 40–150)
Anion Gap: 9 mEq/L (ref 3–11)
BUN: 10.9 mg/dL (ref 7.0–26.0)
CALCIUM: 9.6 mg/dL (ref 8.4–10.4)
CHLORIDE: 108 meq/L (ref 98–109)
CO2: 24 meq/L (ref 22–29)
Creatinine: 0.8 mg/dL (ref 0.6–1.1)
EGFR: 84 mL/min/{1.73_m2} — ABNORMAL LOW (ref >90)
Glucose: 95 mg/dl (ref 70–140)
POTASSIUM: 4.1 meq/L (ref 3.5–5.1)
Sodium: 142 mEq/L (ref 136–145)
Total Bilirubin: 0.42 mg/dL (ref 0.20–1.20)
Total Protein: 7 g/dL (ref 6.4–8.3)

## 2016-01-08 MED ORDER — IOPAMIDOL (ISOVUE-300) INJECTION 61%
75.0000 mL | Freq: Once | INTRAVENOUS | Status: AC | PRN
  Administered 2016-01-08: 75 mL via INTRAVENOUS

## 2016-01-14 ENCOUNTER — Telehealth: Payer: Self-pay | Admitting: Internal Medicine

## 2016-01-14 ENCOUNTER — Ambulatory Visit (HOSPITAL_BASED_OUTPATIENT_CLINIC_OR_DEPARTMENT_OTHER): Payer: Managed Care, Other (non HMO) | Admitting: Internal Medicine

## 2016-01-14 ENCOUNTER — Encounter: Payer: Self-pay | Admitting: Internal Medicine

## 2016-01-14 VITALS — BP 128/74 | HR 88 | Temp 98.3°F | Resp 18 | Ht 66.0 in | Wt 169.6 lb

## 2016-01-14 DIAGNOSIS — R0602 Shortness of breath: Secondary | ICD-10-CM | POA: Diagnosis not present

## 2016-01-14 DIAGNOSIS — C3431 Malignant neoplasm of lower lobe, right bronchus or lung: Secondary | ICD-10-CM

## 2016-01-14 NOTE — Progress Notes (Signed)
[place] Telephone:(336) [phone]   Fax:([phone]  OFFICE PROGRESS NOTE  [name] [name], [place] [address]  DIAGNOSIS: Primary cancer of right lower lobe of lung  Staging form: Lung, AJCC 7th Edition  Clinical: Stage IIIA (T2a, N2, M0) - Unsigned  Staging comments: Adenocarcinoma  Foundation 1 molecular studies: positive for [id number], KRASG13C, [id number]*, [id number]*11, GATA6 amplification and [id number] Negative for BRAF, ALK, MET, RET and ERBB2  PRIOR THERAPY:  1) Concurrent chemoradiation with chemotherapy in the form of weekly carboplatin for an AUC of 2 and paclitaxel 45 mg/m given concurrent with radiation 2) Right VATS (video-assisted thoracoscopic surgery) with right lower lobectomy and mediastinal lymph node dissection under the care of [name] 07/23/2014. The final pathology showed residual 1.8 cm moderate to poorly differentiated invasive adenocarcinoma with no pleural or lymphovascular invasion. The dissected lymph nodes were negative for malignancy and the final pathologic stage was (pT1a, pN0, pMx).  CURRENT THERAPY: Observation.   INTERVAL HISTORY: [name] V[age]SEL89y.o. female returns to the clinic today for follow-up visit accompanied by her husband. The patient is feeling well with no significant complaints except for baseline shortness of breath. She has no significant change since her last visit 6 months ago. She denied having any significant weight loss or night sweats. She has no chest pain, cough or hemoptysis.  She has no nausea or vomiting, no fever or chills. She had repeat CT scan of the head and chest performed recently and she is here for evaluation and discussion of her scan results.  MEDICAL HISTORY: Past Medical History:  Diagnosis Date  . Anxiety    husband also cancer pt  . [place]  . Complication of anesthesia    bronchospasms post-op (pulmonary did consult but  pt was discharged later that day)  With last surg. she was given albuterol inhaler & had steroid with surg.    . Congenital renal atrophy    left kidney, one spontaneous stone passed   . COPD [place] disease[place]   . Degenerative arthritis    spine, hands & knees   . GERD (gastroesophageal reflux disease)   . History of migraine   . Hypertension    h/o PVC- asymptomatic   . 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 PONV (postoperative nausea and vomiting)   . PVC (premature ventricular contraction)   . Shortness of breath dyspnea    with exertion  . Thyroid nodule    benign by biopsy  . Varicose veins    left leg  . Visual disturbance    Episodic  . Vitamin D deficiency   . Wears glasses     ALLERGIES:  is allergic to advil [ibuprofen]; hydrocodone-guaifenesin; sulfa antibiotics; temazepam; plasticized base [plastibase]; and zofran [ondansetron hcl].  MEDICATIONS:  Current Outpatient Prescriptions  Medication Sig Dispense Refill  . benzonatate (TESSALON) 200 MG capsule Take 1 capsule (200 mg total) by mouth 3 (three) times daily as needed for cough. 30 capsule 1  . Biotin 5000 MCG CAPS Take by mouth daily. Reported on 10/23/2015    . budesonide-formoterol (SYMBICORT) 80-4.5 MCG/ACT inhaler Inhale 2 puffs into the lungs 2 (two) times daily. 1 Inhaler 6  . calcium carbonate (OSCAL) 1500 (600 Ca) MG TABS tablet Take 600 mg of elemental calcium by mouth daily with breakfast. Reported on  10/23/2015    . Multiple Vitamin (MULTI VITAMIN DAILY) TABS Take by mouth daily. Reported on 10/23/2015    . predniSONE (DELTASONE) 10 MG tablet 4 tabs for 2 days, then 3 tabs for 2 days, 2 tabs for 2 days, then 1 tab for 2 days, then stop 20 tablet 0  . PROAIR HFA 108 (90 BASE) MCG/ACT inhaler Inhale 2 puffs into the lungs every 4 (four) hours as needed.    [name] Kitchen PROAIR HFA 108 (90 Base) MCG/ACT inhaler INHALE  2 PUFFS INTO THE LUNGS EVERY 6 (SIX) HOURS AS NEEDED FOR WHEEZING OR SHORTNESS OF BREATH. 8.5 Inhaler 5  . verapamil (CALAN-SR) 180 MG CR tablet Take 180 mg by mouth at bedtime.    . vitamin B-12 (CYANOCOBALAMIN) 1000 MCG tablet Take 1,000 mcg by mouth daily. Reported on 10/23/2015    . Vitamin D, Ergocalciferol, (DRISDOL) 50000 UNITS CAPS capsule Take 50,000 Units by mouth every 7 (seven) days. Reported on 10/23/2015  1   No current facility-administered medications for this visit.     SURGICAL HISTORY:  Past Surgical History:  Procedure Laterality Date  . CARPAL TUNNEL RELEASE Left   . CESAREAN SECTION    . COLONOSCOPY W/ BIOPSIES AND POLYPECTOMY    . DILATION AND CURETTAGE OF UTERUS    . TONSILLECTOMY    . VARICOSE VEIN SURGERY     left  . VIDEO ASSISTED THORACOSCOPY (VATS)/ LOBECTOMY Right 07/23/2014   Procedure: VIDEO ASSISTED THORACOSCOPY (VATS)/ LOBECTOMY;  Surgeon: Ivin Poot, MD;  Location: [facility];  Service: Thoracic;  Laterality: Right;  [name] Kitchen VIDEO BRONCHOSCOPY N/A 07/23/2014   Procedure: VIDEO BRONCHOSCOPY;  Surgeon: Ivin Poot, MD;  Location: [facility];  Service: Thoracic;  Laterality: N/A;  . VIDEO BRONCHOSCOPY WITH ENDOBRONCHIAL ULTRASOUND Right 04/17/2014   Procedure: VIDEO BRONCHOSCOPY WITH ENDOBRONCHIAL ULTRASOUND;  Surgeon: Collene Gobble, MD;  Location: [facility];  Service: Thoracic;  Laterality: Right;  [name] Kitchen VIDEO BRONCHOSCOPY WITH ENDOBRONCHIAL ULTRASOUND N/A 07/08/2014   Procedure: VIDEO BRONCHOSCOPY WITH ENDOBRONCHIAL ULTRASOUND;  Surgeon: Ivin Poot, MD;  Location: [facility];  Service: Thoracic;  Laterality: N/A;    REVIEW OF SYSTEMS:  A comprehensive review of systems was negative except for: Respiratory: positive for dyspnea on exertion   PHYSICAL EXAMINATION: General appearance: alert, cooperative and no distress Head: Normocephalic, without obvious abnormality, atraumatic Neck: no adenopathy, no JVD, supple, symmetrical, trachea midline and thyroid not enlarged,  symmetric, no tenderness/mass/nodules Lymph nodes: Cervical, supraclavicular, and axillary nodes normal. Resp: clear to auscultation bilaterally Back: symmetric, no curvature. ROM normal. No CVA tenderness. Cardio: regular rate and rhythm, S1, S2 normal, no murmur, click, rub or gallop GI: soft, non-tender; bowel sounds normal; no masses,  no organomegaly Extremities: extremities normal, atraumatic, no cyanosis or edema Neurologic: Alert and oriented X 3, normal strength and tone. Normal symmetric reflexes. Normal coordination and gait  ECOG PERFORMANCE STATUS: 1 - Symptomatic but completely ambulatory  There were no vitals taken for this visit.  LABORATORY DATA: Lab Results  Component Value Date   WBC 5.9 01/08/2016   HGB 14.1 01/08/2016   HCT 43.1 01/08/2016   MCV 90.4 01/08/2016   PLT 243 01/08/2016      Chemistry      Component Value Date/Time   NA 142 01/08/2016 0827   K 4.1 01/08/2016 0827   CL 104 08/08/2014 1439   CO2 24 01/08/2016 0827   BUN 10.9 01/08/2016 0827   CREATININE 0.8 01/08/2016 0827      Component  Value Date/Time   CALCIUM 9.6 01/08/2016 0827   ALKPHOS 136 01/08/2016 0827   AST 16 01/08/2016 0827   ALT 17 01/08/2016 0827   BILITOT 0.42 01/08/2016 0827       RADIOGRAPHIC STUDIES: Ct Chest W Contrast  Result Date: 01/08/2016 CLINICAL DATA:  History of right-sided lung cancer. EXAM: CT CHEST WITH CONTRAST TECHNIQUE: Multidetector CT imaging of the chest was performed during intravenous contrast administration. CONTRAST:  27m ISOVUE-300 IOPAMIDOL (ISOVUE-300) INJECTION 61% COMPARISON:  06/27/2015 FINDINGS: Cardiovascular: Heart size is upper normal. No pericardial effusion. No thoracic aortic aneurysm. Mediastinum/Nodes: No mediastinal lymphadenopathy. There is no hilar lymphadenopathy. There is no axillary lymphadenopathy. Stable small right thyroid nodule comparing back to 12/26/2014. Lungs/Pleura: Changes of emphysema again noted. Patient is status  post right lower lobectomy. Stable scarring in the lingula and left lower lobe. No suspicious pulmonary nodule or mass. 3 mm right lung nodule seen on 62 series 4 is stable but better demonstrated today given the thinner slice collimation used for the current exam. No focal airspace consolidation. No pulmonary edema or pleural effusion. Upper Abdomen: Small cyst lateral segment left liver unchanged. A second tiny cyst towards the dome of the left liver is also stable. Adrenal glands unremarkable. Visualized portion of the left kidney unchanged. Musculoskeletal: Bone windows reveal no worrisome lytic or sclerotic osseous lesions. IMPRESSION: Stable exam. Postsurgical changes in the right hemi thorax with emphysema. No evidence for new or recurrent disease. Electronically Signed   By: EMisty StanleyM.D.   On: 01/08/2016 13:42    ASSESSMENT AND PLAN: This is a very pleasant 60years old white female with initial diagnosis of stage IIIa non-small cell lung cancer status post a course of concurrent chemoradiation with weekly carboplatin and paclitaxel followed by right lower lobectomy with lymph node dissection and the final pathology revealed 1.8 cm residual invasive adenocarcinoma with no evidence of metastatic disease to the dissected lymph nodes with the final pathologic stage of pT1a, pN0. The recent CT scan of the chest showed no evidence for disease recurrence. I discussed the scan results with the patient and her husband. I recommended for her to continue on observation with repeat CT scan of the chest in 6 months. She was advised to call immediately if she has any concerning symptoms in the interval. The patient voices understanding of current disease status and treatment options and is in agreement with the current care plan.  All questions were answered. The patient knows to call the clinic with any problems, questions or concerns. We can certainly see the patient much sooner if  necessary.  Disclaimer: This note was dictated with voice recognition software. Similar sounding words can inadvertently be transcribed and may not be corrected upon review.

## 2016-01-14 NOTE — Telephone Encounter (Signed)
per pof to sch pt appt-pt req to have CT @ [facility]-pt to cal & get Ct sch

## 2016-05-22 ENCOUNTER — Other Ambulatory Visit: Payer: Self-pay | Admitting: Nurse Practitioner

## 2016-07-16 ENCOUNTER — Other Ambulatory Visit (HOSPITAL_BASED_OUTPATIENT_CLINIC_OR_DEPARTMENT_OTHER): Payer: Managed Care, Other (non HMO)

## 2016-07-16 ENCOUNTER — Ambulatory Visit
Admission: RE | Admit: 2016-07-16 | Discharge: 2016-07-16 | Disposition: A | Discharge status: Home / Self Care | Payer: Managed Care, Other (non HMO) | Source: Ambulatory Visit | Attending: Internal Medicine | Admitting: Internal Medicine

## 2016-07-16 ENCOUNTER — Other Ambulatory Visit: Payer: Managed Care, Other (non HMO)

## 2016-07-16 DIAGNOSIS — C3431 Malignant neoplasm of lower lobe, right bronchus or lung: Secondary | ICD-10-CM

## 2016-07-16 LAB — CBC WITH DIFFERENTIAL/PLATELET
BASO%: 1 % (ref 0.0–2.0)
BASOS ABS: 0.1 10*3/uL (ref 0.0–0.1)
EOS ABS: 0.2 10*3/uL (ref 0.0–0.5)
EOS%: 2.4 % (ref 0.0–7.0)
HCT: 45 % (ref 34.8–46.6)
HEMOGLOBIN: 14.8 g/dL (ref 11.6–15.9)
LYMPH%: 20.5 % (ref 14.0–49.7)
MCH: 29.5 pg (ref 25.1–34.0)
MCHC: 33 g/dL (ref 31.5–36.0)
MCV: 89.6 fL (ref 79.5–101.0)
MONO#: 0.5 10*3/uL (ref 0.1–0.9)
MONO%: 8.3 % (ref 0.0–14.0)
NEUT%: 67.8 % (ref 38.4–76.8)
NEUTROS ABS: 4.4 10*3/uL (ref 1.5–6.5)
PLATELETS: 251 10*3/uL (ref 145–400)
RBC: 5.03 10*6/uL (ref 3.70–5.45)
RDW: 13.3 % (ref 11.2–14.5)
WBC: 6.5 10*3/uL (ref 3.9–10.3)
lymph#: 1.3 10*3/uL (ref 0.9–3.3)

## 2016-07-16 LAB — COMPREHENSIVE METABOLIC PANEL
ALBUMIN: 4 g/dL (ref 3.5–5.0)
ALK PHOS: 138 U/L (ref 40–150)
ALT: 14 U/L (ref 0–55)
ANION GAP: 8 meq/L (ref 3–11)
AST: 16 U/L (ref 5–34)
BILIRUBIN TOTAL: 0.7 mg/dL (ref 0.20–1.20)
BUN: 12.8 mg/dL (ref 7.0–26.0)
CO2: 25 mEq/L (ref 22–29)
Calcium: 10.1 mg/dL (ref 8.4–10.4)
Chloride: 109 mEq/L (ref 98–109)
Creatinine: 0.8 mg/dL (ref 0.6–1.1)
EGFR: 81 mL/min/{1.73_m2} — AB (ref >90)
Glucose: 98 mg/dl (ref 70–140)
POTASSIUM: 4.7 meq/L (ref 3.5–5.1)
SODIUM: 142 meq/L (ref 136–145)
TOTAL PROTEIN: 7.4 g/dL (ref 6.4–8.3)

## 2016-07-16 MED ORDER — IOPAMIDOL (ISOVUE-300) INJECTION 61%
75.0000 mL | Freq: Once | INTRAVENOUS | Status: AC | PRN
  Administered 2016-07-16: 75 mL via INTRAVENOUS

## 2016-07-22 ENCOUNTER — Encounter: Payer: Self-pay | Admitting: Internal Medicine

## 2016-07-22 ENCOUNTER — Telehealth: Payer: Self-pay | Admitting: Hematology and Oncology

## 2016-07-22 ENCOUNTER — Telehealth: Payer: Self-pay | Admitting: Internal Medicine

## 2016-07-22 ENCOUNTER — Ambulatory Visit (HOSPITAL_BASED_OUTPATIENT_CLINIC_OR_DEPARTMENT_OTHER): Payer: Managed Care, Other (non HMO) | Admitting: Internal Medicine

## 2016-07-22 VITALS — BP 145/87 | HR 79 | Temp 97.6°F | Resp 18 | Ht 66.0 in | Wt 173.4 lb

## 2016-07-22 DIAGNOSIS — Z85118 Personal history of other malignant neoplasm of bronchus and lung: Secondary | ICD-10-CM

## 2016-07-22 DIAGNOSIS — C3431 Malignant neoplasm of lower lobe, right bronchus or lung: Secondary | ICD-10-CM

## 2016-07-22 NOTE — Telephone Encounter (Signed)
Appointments scheduled per 2/15 LOS. Patient given AVS report and calendars with future scheduled appointments.

## 2016-07-22 NOTE — Telephone Encounter (Signed)
Appointments scheduled per 2/15 LOS. Patient given AVS report and calendars with future scheduled appointments. Spoke with Arena at [facility] to schedule CT scan appointment for patient. Patient scheduled for 8/15 @ 1130. Patient aware.

## 2016-07-22 NOTE — Progress Notes (Signed)
[facility] [phone]   Fax:([phone]  OFFICE PROGRESS NOTE  [name] [name], [name] [address]  DIAGNOSIS: Primary cancer of right lower lobe of lung  Staging form: Lung, AJCC 7th Edition  Clinical: Stage IIIA (T2a, N2, M0) - Unsigned  Staging comments: Adenocarcinoma  Foundation 1 molecular studies: positive for [id number], KRASG13C, [id number]*, STK11D57f*11, GATA6 amplification and [id number] Negative for BRAF, ALK, MET, RET and ERBB2  PRIOR THERAPY:  1) Concurrent chemoradiation with chemotherapy in the form of weekly carboplatin for an AUC of 2 and paclitaxel 45 mg/m given concurrent with radiation 2) Right VATS (video-assisted thoracoscopic surgery) with right lower lobectomy and mediastinal lymph node dissection under the care of [name] 07/23/2014. The final pathology showed residual 1.8 cm moderate to poorly differentiated invasive adenocarcinoma with no pleural or lymphovascular invasion. The dissected lymph nodes were negative for malignancy and the final pathologic stage was (pT1a, pN0, pMx).  CURRENT THERAPY: Observation.   INTERVAL HISTORY: [name] JABLON61y.o. female came to the clinic today for follow-up visit. The patient is currently on observation and feeling fine. She denied having any chest pain, shortness of breath, cough or hemoptysis. She has no weight loss or night sweats. She denied having any fever or chills. The patient has no nausea, vomiting, diarrhea or constipation. She has no headache or visual changes. She had repeat CT scan of the chest performed recently and she is here for evaluation and discussion of her scan results.  MEDICAL HISTORY: Past Medical History:  Diagnosis Date  . Anxiety    husband also cancer pt  . [place]  . Complication of anesthesia    bronchospasms post-op (pulmonary did consult but pt was discharged later that day)  With last  surg. she was given albuterol inhaler & had steroid with surg.    . Congenital renal atrophy    left kidney, one spontaneous stone passed   . COPD [place] obstructive [place] disease) [place]   . Degenerative arthritis    spine, hands & knees   . GERD (gastroesophageal reflux disease)   . History of migraine   . Hypertension    h/o PVC- asymptomatic   . 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 PONV (postoperative nausea and vomiting)   . PVC (premature ventricular contraction)   . Shortness of breath dyspnea    with exertion  . Thyroid nodule    benign by biopsy  . Varicose veins    left leg  . Visual disturbance    Episodic  . Vitamin D deficiency   . Wears glasses     ALLERGIES:  is allergic to advil [ibuprofen]; hydrocodone-guaifenesin; sulfa antibiotics; temazepam; plasticized base [plastibase]; and zofran [ondansetron hcl].  MEDICATIONS:  Current Outpatient Prescriptions  Medication Sig Dispense Refill  . Cholecalciferol (VITAMIN D3) 50000 units CAPS Take 1 tablet by mouth once a week.  3  . Multiple Vitamin (MULTI VITAMIN DAILY) TABS Take by mouth daily. Reported on 10/23/2015    . verapamil (CALAN-SR) 180 MG CR tablet Take 180 mg by mouth at bedtime.    . vitamin B-12 (CYANOCOBALAMIN) 1000 MCG tablet Take 1,000 mcg by mouth daily. Reported on 10/23/2015    . PROAIR HFA 108 (90 Base) MCG/ACT inhaler Inhale 1 puff into the lungs 3 (three) times daily as needed.    .[name] Kitchen  SYMBICORT 80-4.5 MCG/ACT inhaler Inhale 1 puff into the lungs 2 (two) times daily.     No current facility-administered medications for this visit.     SURGICAL HISTORY:  Past Surgical History:  Procedure Laterality Date  . CARPAL TUNNEL RELEASE Left   . CESAREAN SECTION    . COLONOSCOPY W/ BIOPSIES AND POLYPECTOMY    . DILATION AND CURETTAGE OF UTERUS    . TONSILLECTOMY    . VARICOSE VEIN SURGERY     left  . VIDEO ASSISTED  THORACOSCOPY (VATS)/ LOBECTOMY Right 07/23/2014   Procedure: VIDEO ASSISTED THORACOSCOPY (VATS)/ LOBECTOMY;  Surgeon: Ivin Poot, MD;  Location: [facility];  Service: Thoracic;  Laterality: Right;  [name] Kitchen VIDEO BRONCHOSCOPY N/A 07/23/2014   Procedure: VIDEO BRONCHOSCOPY;  Surgeon: Ivin Poot, MD;  Location: [facility];  Service: Thoracic;  Laterality: N/A;  . VIDEO BRONCHOSCOPY WITH ENDOBRONCHIAL ULTRASOUND Right 04/17/2014   Procedure: VIDEO BRONCHOSCOPY WITH ENDOBRONCHIAL ULTRASOUND;  Surgeon: Collene Gobble, MD;  Location: [facility];  Service: Thoracic;  Laterality: Right;  [name] Kitchen VIDEO BRONCHOSCOPY WITH ENDOBRONCHIAL ULTRASOUND N/A 07/08/2014   Procedure: VIDEO BRONCHOSCOPY WITH ENDOBRONCHIAL ULTRASOUND;  Surgeon: Ivin Poot, MD;  Location[facility];  Service: Thoracic;  Laterality: N/A;    REVIEW OF SYSTEMS:  A comprehensive review of systems was negative.   PHYSICAL EXAMINATION: General appearance: alert, cooperative and no distress Head: Normocephalic, without obvious abnormality, atraumatic Neck: no adenopathy, no JVD, supple, symmetrical, trachea midline and thyroid not enlarged, symmetric, no tenderness/mass/nodules Lymph nodes: Cervical, supraclavicular, and axillary nodes normal. Resp: clear to auscultation bilaterally Back: symmetric, no curvature. ROM normal. No CVA tenderness. Cardio: regular rate and rhythm, S1, S2 normal, no murmur, click, rub or gallop GI: soft, non-tender; bowel sounds normal; no masses,  no organomegaly Extremities: extremities normal, atraumatic, no cyanosis or edema  ECOG PERFORMANCE STATUS: 0 - Asymptomatic  Blood pressure (!) 145/87, pulse 79, temperature 97.6 F (36.4 C), temperature source Oral, resp. rate 18, height 5' 6"  (1.676 m), weight 173 lb 6.4 oz (78.7 kg), SpO2 97 %.  LABORATORY DATA: Lab Results  Component Value Date   WBC 6.5 07/16/2016   HGB 14.8 07/16/2016   HCT 45.0 07/16/2016   MCV 89.6 07/16/2016   PLT 251 07/16/2016      Chemistry        Component Value Date/Time   NA 142 07/16/2016 0940   K 4.7 07/16/2016 0940   CL 104 08/08/2014 1439   CO2 25 07/16/2016 0940   BUN 12.8 07/16/2016 0940   CREATININE 0.8 07/16/2016 0940      Component Value Date/Time   CALCIUM 10.1 07/16/2016 0940   ALKPHOS 138 07/16/2016 0940   AST 16 07/16/2016 0940   ALT 14 07/16/2016 0940   BILITOT 0.70 07/16/2016 0940       RADIOGRAPHIC STUDIES: Ct Chest W Contrast  Result Date: 07/16/2016 CLINICAL DATA:  Restaging lung cancer. History of right lower lobe lobectomy in 2015. Chemotherapy and radiation therapy. Creatinine was obtained on site at [facility] at [address]Results: Creatinine 0.7 mg/dL. EXAM: CT CHEST WITH CONTRAST TECHNIQUE: Multidetector CT imaging of the chest was performed during intravenous contrast administration. CONTRAST:  54m ISOVUE-300 IOPAMIDOL (ISOVUE-300) INJECTION 61% COMPARISON:  Chest CT 01/08/2016 and prior study from 06/27/2015 FINDINGS: Chest wall: No breast masses, supraclavicular or axillary lymphadenopathy. Small scattered lymph nodes appears stable. Stable right thyroid lobe nodule measuring less than 1 cm. Cardiovascular: The heart is upper limits of normal in size  but stable. No pericardial effusion. Stable mild tortuosity and ectasia of the thoracic aorta but no aneurysm or dissection. The branch vessels are patent. Three-vessel coronary artery calcifications are noted. Mediastinum/Nodes: Small scattered mediastinal and hilar lymph nodes but no mass or adenopathy. Stable fluid in the pericardial recesses. Lungs/Pleura: Stable surgical changes from a right lower lobe lobectomy. No findings for recurrent tumor. Stable advanced emphysematous changes and pulmonary scarring. No worrisome pulmonary nodules to suggest pulmonary metastatic disease. No new pulmonary lesions. No pleural effusion. Upper Abdomen: No significant upper abdominal findings. Simple appearing left hepatic lobe cysts is stable. No  adrenal gland metastasis. Musculoskeletal: No significant bony findings. IMPRESSION: 1. Stable surgical changes from a right lower lobe lobectomy. No findings for recurrent tumor, regional adenopathy or metastatic disease. 2. Stable advanced emphysematous changes and pulmonary scarring. No acute pulmonary process. 3. Three-vessel coronary artery calcifications. Electronically Signed   By: Marijo Sanes M.D.   On: 07/16/2016 15:05    ASSESSMENT AND PLAN:  This is a very pleasant 61 years old white female with a stage IIIa non-small cell lung cancer status post neoadjuvant concurrent chemoradiation with weekly carboplatin and paclitaxel followed by right lower lobectomy and lymph node dissection with the final pathology consistent with a pathological stage pT1a, pN0. The patient is currently on observation and feeling fine. Her recent CT scan of the chest showed no evidence for disease recurrence. I discussed the scan results with the patient today. I recommended for her to continue on observation with repeat CT scan of the chest in 6 months. She was advised to call immediately if she has any concerning symptoms in the interval. The patient voices understanding of current disease status and treatment options and is in agreement with the current care plan.  All questions were answered. The patient knows to call the clinic with any problems, questions or concerns. We can certainly see the patient much sooner if necessary. I spent 10 minutes counseling the patient face to face. The total time spent in the appointment was 15 minutes.  Disclaimer: This note was dictated with voice recognition software. Similar sounding words can inadvertently be transcribed and may not be corrected upon review.

## 2016-07-26 ENCOUNTER — Ambulatory Visit: Payer: Managed Care, Other (non HMO) | Admitting: Emergency Medicine

## 2016-08-13 ENCOUNTER — Encounter: Payer: Self-pay | Admitting: Emergency Medicine

## 2016-08-13 ENCOUNTER — Ambulatory Visit (INDEPENDENT_AMBULATORY_CARE_PROVIDER_SITE_OTHER): Payer: Managed Care, Other (non HMO) | Admitting: Emergency Medicine

## 2016-08-13 DIAGNOSIS — C3431 Malignant neoplasm of lower lobe, right bronchus or lung: Secondary | ICD-10-CM

## 2016-08-13 DIAGNOSIS — J449 Chronic obstructive pulmonary disease, unspecified: Secondary | ICD-10-CM

## 2016-08-13 MED ORDER — TIOTROPIUM BROMIDE MONOHYDRATE 18 MCG IN CAPS: 18.0000 ug | ORAL_CAPSULE | Freq: Every day | RESPIRATORY_TRACT | 0 refills | Status: DC

## 2016-08-13 NOTE — Progress Notes (Signed)
   Subjective:    Patient ID: [name] [name], female    DOB: Jul 15, 1955, 61 y.o.   MRN: [id number]  HPI 61 yo woman, former smoker (20 pk-yrs), hx of HTN, migraines. Hx COPD, adenocarcinoma of the lung status post right lower lobectomy.  ROV 07/28/15 -- patient has a history of COPD and adenocarcinoma of the lung. She has undergone VATS right lower lobectomy following neoadjuvant chemotherapy. She is currently on observation. Her last CT scan of the chest was on/20/17 which I personally reviewed and which shows no evidence of recurrent disease. She has been doing well, has not been exercising very much and feels out of shape, has put on about 20 lbs. She not having wheeze, minimal cough.   ROV 08/13/16 -- follow up visit for COPD and adenocarcinoma of the lung. Most recent CT scan of the chest was done on 07/16/16 I have personally reviewed. This shows no evidence of recurrence, stable emphysematous changes. She had an acute exacerbation last Spring, treated with pred and abx. We have discussed scheduled BD's but she has never been on one. She is having dyspnea with walking on an incline. Minimal cough or sputum. She has only used albuterol twice ever   Review of Systems  Constitutional: Negative for fever and unexpected weight change.  HENT: Negative for congestion, dental problem, ear pain, nosebleeds, postnasal drip, rhinorrhea, sinus pressure, sneezing, sore throat and trouble swallowing.   Eyes: Negative for redness and itching.  Respiratory: Positive for cough and shortness of breath. Negative for chest tightness and wheezing.   Cardiovascular: Negative for palpitations and leg swelling.  Gastrointestinal: Negative for nausea and vomiting.  Genitourinary: Negative for dysuria.  Musculoskeletal: Negative for joint swelling.  Skin: Negative for rash.  Neurological: Negative for headaches.  Hematological: Does not bruise/bleed easily.  Psychiatric/Behavioral: Negative for dysphoric mood. The  patient is not nervous/anxious.        Objective:   Physical Exam Vitals:   08/13/16 0935  BP: 128/74  Pulse: 87  SpO2: 97%  Weight: 173 lb (78.5 kg)  Height: '5\' 6"'$  (1.676 m)   Gen: Pleasant, well-nourished, in no distress,  normal affect  ENT: No lesions,  mouth clear,  oropharynx clear, no postnasal drip  Lungs: No use of accessory muscles, clear without rales or rhonchi  Cardiovascular: RRR, heart sounds normal, no murmur or gallops, no peripheral edema  Musculoskeletal: No deformities, no cyanosis or clubbing  Neuro: alert, non focal  Skin: Warm, no lesions or rashes       Assessment & Plan:  Primary cancer of right lower lobe of lung Following w Dr Julien Nordmann, no evidence recurrence at this time.   COPD (chronic obstructive pulmonary disease) We discussed starting an inhaled medication to help with breathing. We will start Spiriva one inhalation daily to see if you benefit. Call us to let us know if you want to continue this medication.  Keep albuterol available to use 2 puffs up to every 4 hours if needed for shortness of breath.  Follow with Dr Lamonte Sakai in 6 months or sooner if you have any problems  Baltazar Apo, MD, PhD 08/13/2016, 9:55 AM [facility] [phone] or if no answer [phone]

## 2016-08-13 NOTE — Assessment & Plan Note (Signed)
We discussed starting an inhaled medication to help with breathing. We will start Spiriva one inhalation daily to see if you benefit. Call us to let us know if you want to continue this medication.  Keep albuterol available to use 2 puffs up to every 4 hours if needed for shortness of breath.  Follow with Dr Lamonte Sakai in 6 months or sooner if you have any problems

## 2016-08-13 NOTE — Assessment & Plan Note (Signed)
Following w Dr Julien Nordmann, no evidence recurrence at this time.

## 2016-08-13 NOTE — Addendum Note (Signed)
Addended by: Maryanna Shape A on: 08/13/2016 10:36 AM   Modules accepted: Orders

## 2016-08-13 NOTE — Patient Instructions (Addendum)
We discussed starting an inhaled medication to help with breathing. We will start Spiriva one inhalation daily to see if you benefit. Call us to let us know if you want to continue this medication.  Keep albuterol available to use 2 puffs up to every 4 hours if needed for shortness of breath.  Continue to follow with Dr Julien Nordmann  Follow with Dr Lamonte Sakai in 6 months or sooner if you have any problems

## 2016-08-27 ENCOUNTER — Telehealth: Payer: Self-pay | Admitting: Medical Oncology

## 2016-08-27 NOTE — Telephone Encounter (Signed)
Returned call after dtr called asking for anti anxiety med for pt whose husband is in hospital. I called back and told her and pt to contact PCP since pt not getting any tx from [facility].

## 2016-09-28 ENCOUNTER — Other Ambulatory Visit: Payer: Self-pay | Admitting: Orthopedic Surgery

## 2016-09-28 MED ORDER — AMPICILLIN-SULBACTAM SODIUM 3 (2-1) G IJ SOLR: 3.0000 g | Freq: Once | INTRAMUSCULAR | Status: DC

## 2016-09-29 ENCOUNTER — Ambulatory Visit (HOSPITAL_COMMUNITY)
Admission: RE | Admit: 2016-09-29 | Discharge: 2016-09-29 | Disposition: A | Discharge status: Home / Self Care | Payer: Managed Care, Other (non HMO) | Source: Ambulatory Visit | Attending: Orthopedic Surgery | Admitting: Orthopedic Surgery

## 2016-09-29 DIAGNOSIS — X58XXXA Exposure to other specified factors, initial encounter: Secondary | ICD-10-CM | POA: Diagnosis not present

## 2016-09-29 DIAGNOSIS — B998 Other infectious disease: Secondary | ICD-10-CM | POA: Insufficient documentation

## 2016-09-29 DIAGNOSIS — T148XXA Other injury of unspecified body region, initial encounter: Secondary | ICD-10-CM | POA: Insufficient documentation

## 2016-09-29 MED ORDER — SODIUM CHLORIDE 0.9 % IV SOLN
3.0000 g | Freq: Once | INTRAVENOUS | Status: AC
  Administered 2016-09-29: 3 g via INTRAVENOUS
  Filled 2016-09-29: qty 3

## 2017-01-19 ENCOUNTER — Ambulatory Visit
Admission: RE | Admit: 2017-01-19 | Discharge: 2017-01-19 | Disposition: A | Discharge status: Home / Self Care | Payer: Managed Care, Other (non HMO) | Source: Ambulatory Visit | Attending: Internal Medicine | Admitting: Internal Medicine

## 2017-01-19 ENCOUNTER — Other Ambulatory Visit (HOSPITAL_BASED_OUTPATIENT_CLINIC_OR_DEPARTMENT_OTHER): Payer: Managed Care, Other (non HMO)

## 2017-01-19 DIAGNOSIS — C3431 Malignant neoplasm of lower lobe, right bronchus or lung: Secondary | ICD-10-CM | POA: Diagnosis not present

## 2017-01-19 LAB — CBC WITH DIFFERENTIAL/PLATELET
BASO%: 1.2 % (ref 0.0–2.0)
Basophils Absolute: 0.1 10*3/uL (ref 0.0–0.1)
EOS%: 2.5 % (ref 0.0–7.0)
Eosinophils Absolute: 0.1 10*3/uL (ref 0.0–0.5)
HCT: 42.8 % (ref 34.8–46.6)
HGB: 14.2 g/dL (ref 11.6–15.9)
LYMPH%: 18.6 % (ref 14.0–49.7)
MCH: 30.1 pg (ref 25.1–34.0)
MCHC: 33.2 g/dL (ref 31.5–36.0)
MCV: 90.8 fL (ref 79.5–101.0)
MONO#: 0.4 10*3/uL (ref 0.1–0.9)
MONO%: 6.4 % (ref 0.0–14.0)
NEUT%: 71.3 % (ref 38.4–76.8)
NEUTROS ABS: 4.2 10*3/uL (ref 1.5–6.5)
Platelets: 265 10*3/uL (ref 145–400)
RBC: 4.72 10*6/uL (ref 3.70–5.45)
RDW: 13.4 % (ref 11.2–14.5)
WBC: 5.9 10*3/uL (ref 3.9–10.3)
lymph#: 1.1 10*3/uL (ref 0.9–3.3)

## 2017-01-19 LAB — COMPREHENSIVE METABOLIC PANEL
ALK PHOS: 121 U/L (ref 40–150)
ALT: 20 U/L (ref 0–55)
AST: 19 U/L (ref 5–34)
Albumin: 3.7 g/dL (ref 3.5–5.0)
Anion Gap: 8 mEq/L (ref 3–11)
BUN: 15.2 mg/dL (ref 7.0–26.0)
CHLORIDE: 109 meq/L (ref 98–109)
CO2: 25 meq/L (ref 22–29)
CREATININE: 0.8 mg/dL (ref 0.6–1.1)
Calcium: 9.5 mg/dL (ref 8.4–10.4)
EGFR: 82 mL/min/{1.73_m2} — ABNORMAL LOW (ref >90)
GLUCOSE: 101 mg/dL (ref 70–140)
Potassium: 4.3 mEq/L (ref 3.5–5.1)
SODIUM: 142 meq/L (ref 136–145)
TOTAL PROTEIN: 6.9 g/dL (ref 6.4–8.3)
Total Bilirubin: 0.64 mg/dL (ref 0.20–1.20)

## 2017-01-19 MED ORDER — IOPAMIDOL (ISOVUE-300) INJECTION 61%
75.0000 mL | Freq: Once | INTRAVENOUS | Status: AC | PRN
  Administered 2017-01-19: 75 mL via INTRAVENOUS

## 2017-01-26 ENCOUNTER — Telehealth: Payer: Self-pay | Admitting: Internal Medicine

## 2017-01-26 ENCOUNTER — Ambulatory Visit (HOSPITAL_BASED_OUTPATIENT_CLINIC_OR_DEPARTMENT_OTHER): Payer: Managed Care, Other (non HMO) | Admitting: Internal Medicine

## 2017-01-26 ENCOUNTER — Encounter: Payer: Self-pay | Admitting: Internal Medicine

## 2017-01-26 VITALS — BP 148/78 | HR 83 | Temp 98.4°F | Resp 18 | Ht 66.0 in | Wt 158.8 lb

## 2017-01-26 DIAGNOSIS — Z85118 Personal history of other malignant neoplasm of bronchus and lung: Secondary | ICD-10-CM | POA: Diagnosis not present

## 2017-01-26 DIAGNOSIS — C3431 Malignant neoplasm of lower lobe, right bronchus or lung: Secondary | ICD-10-CM

## 2017-01-26 NOTE — Progress Notes (Signed)
[facility] [phone]   [phone]  OFFICE PROGRESS NOTE  [name], [place] [address]  DIAGNOSIS: Primary cancer of right lower lobe of lung  Staging form: Lung, AJCC 7th Edition  Clinical: Stage IIIA (T2a, N2, M0) - Unsigned  Staging comments: Adenocarcinoma  Foundation 1 molecular studies: positive for [id number], KRASG13C, [id number]*, [id number]*11, GATA6 amplification and [id number] Negative for BRAF, ALK, MET, RET and ERBB2  PRIOR THERAPY:  1) Concurrent chemoradiation with chemotherapy in the form of weekly carboplatin for an AUC of 2 and paclitaxel 45 mg/m given concurrent with radiation 2) Right VATS (video-assisted thoracoscopic surgery) with right lower lobectomy and mediastinal lymph node dissection under the care of [name] 07/23/2014. The final pathology showed residual 1.8 cm moderate to poorly differentiated invasive adenocarcinoma with no pleural or lymphovascular invasion. The dissected lymph nodes were negative for malignancy and the final pathologic stage was (pT1a, pN0, pMx).  CURRENT THERAPY: Observation.   INTERVAL HISTORY: [name] LAFAVE61y.o. female returns to the clinic today for six-month follow-up visit. The patient is feeling fine today with no specific complaints except for some memory problem recently. She denied having any chest pain, shortness of breath, cough or hemoptysis. She denied having any fever or chills. She has no nausea, vomiting, diarrhea or constipation. She denied having any other neurological symptoms. She had repeat CT scan of the chest performed recently and she is here for evaluation and discussion of her scan results.  MEDICAL HISTORY: Past Medical History:  Diagnosis Date  . Anxiety    husband also cancer pt  . [place]  . Complication of anesthesia    bronchospasms post-op (pulmonary did consult but pt was discharged later  that day)  With last surg. she was given albuterol inhaler & had steroid with surg.    . Congenital renal atrophy    left kidney, one spontaneous stone passed   . COPD ([place]ronic obstructive [place] disease) [place]   . Degenerative arthritis    spine, hands & knees   . GERD (gastroesophageal reflux disease)   . History of migraine   . Hypertension    h/o PVC- asymptomatic   . 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 PONV (postoperative nausea and vomiting)   . PVC (premature ventricular contraction)   . Shortness of breath dyspnea    with exertion  . Thyroid nodule    benign by biopsy  . Varicose veins    left leg  . Visual disturbance    Episodic  . Vitamin D deficiency   . Wears glasses     ALLERGIES:  is allergic to advil [ibuprofen]; hydrocodone-guaifenesin; sulfa antibiotics; temazepam; plasticized base [plastibase]; and zofran [ondansetron hcl].  MEDICATIONS:  Current Outpatient Prescriptions  Medication Sig Dispense Refill  . Cholecalciferol (VITAMIN D3) 50000 units CAPS Take 1 tablet by mouth once a week.  3  . PROAIR HFA 108 (90 Base) MCG/ACT inhaler Inhale 1 puff into the lungs 3 (three) times daily as needed.    . SYMBICORT 80-4.5 MCG/ACT inhaler Inhale 1 puff into the lungs 2 (two) times daily.    .[name] Kitchentiotropium (SPIRIVA HANDIHALER) 18 MCG inhalation capsule Place 1 capsule (18 mcg total) into inhaler and inhale daily. 30 capsule 0  . verapamil (CALAN-SR) 180 MG CR tablet Take 180 mg by mouth at  bedtime.     Current Facility-Administered Medications  Medication Dose Route Frequency Provider Last Rate Last Dose  . Ampicillin-Sulbactam (UNASYN) 3 g in sodium chloride 0.9 % 100 mL IVPB  3 g Intravenous Once Avelina Laine, PA-C        SURGICAL HISTORY:  Past Surgical History:  Procedure Laterality Date  . CARPAL TUNNEL RELEASE Left   . CESAREAN SECTION    . COLONOSCOPY W/ BIOPSIES AND  POLYPECTOMY    . DILATION AND CURETTAGE OF UTERUS    . TONSILLECTOMY    . VARICOSE VEIN SURGERY     left  . VIDEO ASSISTED THORACOSCOPY (VATS)/ LOBECTOMY Right 07/23/2014   Procedure: VIDEO ASSISTED THORACOSCOPY (VATS)/ LOBECTOMY;  Surgeon: Ivin Poot, MD;  Location: [facility];  Service: Thoracic;  Laterality: Right;  [name] Kitchen VIDEO BRONCHOSCOPY N/A 07/23/2014   Procedure: VIDEO BRONCHOSCOPY;  Surgeon: Ivin Poot, MD;  Location: [facility];  Service: Thoracic;  Laterality: N/A;  . VIDEO BRONCHOSCOPY WITH ENDOBRONCHIAL ULTRASOUND Right 04/17/2014   Procedure: VIDEO BRONCHOSCOPY WITH ENDOBRONCHIAL ULTRASOUND;  Surgeon: Collene Gobble, MD;  Location: [facility];  Service: Thoracic;  Laterality: Right;  [name] Kitchen VIDEO BRONCHOSCOPY WITH ENDOBRONCHIAL ULTRASOUND N/A 07/08/2014   Procedure: VIDEO BRONCHOSCOPY WITH ENDOBRONCHIAL ULTRASOUND;  Surgeon: Ivin Poot, MD;  [facility];  Service: Thoracic;  Laterality: N/A;    REVIEW OF SYSTEMS:  A comprehensive review of systems was negative except for: Neurological: positive for memory problems   PHYSICAL EXAMINATION: General appearance: alert, cooperative and no distress Head: Normocephalic, without obvious abnormality, atraumatic Neck: no adenopathy, no JVD, supple, symmetrical, trachea midline and thyroid not enlarged, symmetric, no tenderness/mass/nodules Lymph nodes: Cervical, supraclavicular, and axillary nodes normal. Resp: clear to auscultation bilaterally Back: symmetric, no curvature. ROM normal. No CVA tenderness. Cardio: regular rate and rhythm, S1, S2 normal, no murmur, click, rub or gallop GI: soft, non-tender; bowel sounds normal; no masses,  no organomegaly Extremities: extremities normal, atraumatic, no cyanosis or edema  ECOG PERFORMANCE STATUS: 0 - Asymptomatic  Blood pressure (!) 148/78, pulse 83, temperature 98.4 F (36.9 C), temperature source Oral, resp. rate 18, height '5\' 6"'$  (1.676 m), weight 158 lb 12.8 oz (72 kg), SpO2 98  %.  LABORATORY DATA: Lab Results  Component Value Date   WBC 5.9 01/19/2017   HGB 14.2 01/19/2017   HCT 42.8 01/19/2017   MCV 90.8 01/19/2017   PLT 265 01/19/2017      Chemistry      Component Value Date/Time   NA 142 01/19/2017 0958   K 4.3 01/19/2017 0958   CL 104 08/08/2014 1439   CO2 25 01/19/2017 0958   BUN 15.2 01/19/2017 0958   CREATININE 0.8 01/19/2017 0958      Component Value Date/Time   CALCIUM 9.5 01/19/2017 0958   ALKPHOS 121 01/19/2017 0958   AST 19 01/19/2017 0958   ALT 20 01/19/2017 0958   BILITOT 0.64 01/19/2017 0958       RADIOGRAPHIC STUDIES: Ct Chest W Contrast  Result Date: 01/19/2017 CLINICAL DATA:  History of right lower lobe lung cancer status post right lower lobectomy, chemotherapy and radiation. EXAM: CT CHEST WITH CONTRAST TECHNIQUE: Multidetector CT imaging of the chest was performed during intravenous contrast administration. CONTRAST:  2m ISOVUE-300 IOPAMIDOL (ISOVUE-300) INJECTION 61% COMPARISON:  Chest CT 07/16/2016 FINDINGS: Cardiovascular: The heart is normal in size. No pericardial effusion. Stable mild tortuosity and ectasia of the thoracic aorta but no aneurysmal dilatation. No dissection. The branch vessels are patent. Stable three-vessel  coronary artery calcifications. Mediastinum/Nodes: No mediastinal or hilar mass or lymphadenopathy. Small scattered lymph nodes are stable. Lungs/Pleura: Stable emphysematous changes and areas of pulmonary scarring. No worrisome pulmonary lesions or pulmonary nodules. Stable surgical changes from a right lower lobe lobectomy. Stable left upper lobe linear scarring changes. Upper Abdomen: No significant upper abdominal findings. Stable left hepatic lobe cyst. No findings for adrenal gland or hepatic metastatic disease. Chest wall/ Musculoskeletal: No breast masses, supraclavicular or axillary lymphadenopathy. Small scattered lymph nodes are stable. Stable 7 mm left axillary lymph node on image number 28. The  thyroid gland appears normal except for small nodules or cysts. These are stable. No significant bony findings. No lytic or sclerotic bone lesions to suggest metastatic disease. Stable rib changes on the right side from prior thoracotomy. IMPRESSION: 1. Status post right lower lobe lobectomy. No findings for recurrent tumor, regional adenopathy or metastatic disease. 2. Stable emphysematous changes and areas of pulmonary scarring. No worrisome pulmonary lesions or evidence of metastatic pulmonary nodules. 3. Stable mild pulmonary artery enlargement suggesting pulmonary hypertension. 4. Stable three-vessel coronary artery calcifications. 5. No findings for upper abdominal metastatic disease or osseous metastatic disease. Aortic Atherosclerosis (ICD10-I70.0) and Emphysema (ICD10-J43.9). Electronically Signed   By: Marijo Sanes M.D.   On: 01/19/2017 13:52    ASSESSMENT AND PLAN:  This is a very pleasant 61 years old white female with a stage IIIa non-small cell lung cancer status post neoadjuvant concurrent chemoradiation with weekly carboplatin and paclitaxel followed by right lower lobectomy and lymph node dissection with the final pathology consistent with a pathological stage pT1a, pN0. The patient is currently on observation and has been feeling very well except for some memory issues recently. She denied having any other neurological symptoms. I recommended for the patient to see a neurologist for evaluation of her dementia. She had repeat CT scan of the chest performed recently that showed no evidence for disease recurrence or metastasis. I recommended for her to continue on observation with repeat CT scan of the chest in 6 months. She was advised to call immediately if she has any concerning symptoms in the interval. The patient voices understanding of current disease status and treatment options and is in agreement with the current care plan.  All questions were answered. The patient knows to call  the clinic with any problems, questions or concerns. We can certainly see the patient much sooner if necessary. I spent 10 minutes counseling the patient face to face. The total time spent in the appointment was 15 minutes.  Disclaimer: This note was dictated with voice recognition software. Similar sounding words can inadvertently be transcribed and may not be corrected upon review.

## 2017-01-26 NOTE — Telephone Encounter (Signed)
Gave patient calendar for upcoming appts. Did not want avs

## 2017-02-15 ENCOUNTER — Encounter: Payer: Self-pay | Admitting: Emergency Medicine

## 2017-02-15 ENCOUNTER — Ambulatory Visit (INDEPENDENT_AMBULATORY_CARE_PROVIDER_SITE_OTHER): Payer: Managed Care, Other (non HMO) | Admitting: Emergency Medicine

## 2017-02-15 DIAGNOSIS — J449 Chronic obstructive pulmonary disease, unspecified: Secondary | ICD-10-CM

## 2017-02-15 DIAGNOSIS — C3431 Malignant neoplasm of lower lobe, right bronchus or lung: Secondary | ICD-10-CM | POA: Diagnosis not present

## 2017-02-15 DIAGNOSIS — Z23 Encounter for immunization: Secondary | ICD-10-CM

## 2017-02-15 NOTE — Assessment & Plan Note (Signed)
Following with Dr Julien Nordmann.

## 2017-02-15 NOTE — Patient Instructions (Addendum)
We will hold off on adding an every day inhaler at this time.  Please keep albuterol available to use 2 puffs as needed for shortness of breath Continue to work hard on your exercise and weight loss.  Flu shot today.  Follow with Dr Lamonte Sakai in 12 months or sooner if you have any problems

## 2017-02-15 NOTE — Progress Notes (Signed)
Subjective:    Patient ID: [name] [name], female    DOB: 1956-01-21, 61 y.o.   MRN: [id number]  HPI 61 yo woman, former smoker (20 pk-yrs), hx of HTN, migraines. Hx COPD, adenocarcinoma of the lung status post right lower lobectomy.  ROV 07/28/15 -- patient has a history of COPD and adenocarcinoma of the lung a history of COPD, adenocarcinoma of the lung and adenocarcinoma of the lung. She has undergone VATS right lower lobectomy following neoadjuvant chemotherapy. She is currently on observation. Her last CT scan of the chest was on/20/17 which I personally reviewed and which shows no evidence of recurrent disease. She has been doing well, has not been exercising very much and feels out of shape, has put on about 20 lbs. She not having wheeze, minimal cough.   ROV 08/13/16 -- follow up visit for COPD and adenocarcinoma of the lung. Most recent CT scan of the chest was done on 07/16/16 I have personally reviewed. This shows no evidence of recurrence, stable emphysematous changes. She had an acute exacerbation last Spring, treated with pred and abx. We have discussed scheduled BD's but she has never been on one. She is having dyspnea with walking on an incline. Minimal cough or sputum. She has only used albuterol twice ever  ROV 02/15/17 -- Patient has a history of COPD a history of COPD, lower lobectomy for adenocarcinoma. Most recent CT scan of the chest 01/19/17 was reviewed by me. This shows no evidence for recurrent malignancy. At her last visit in March we discussed Spiriva. She never started it. Unfortunately her husband died since last time. She feels that her breathing is doing well - is able to exert.    Review of Systems  Constitutional: Negative for fever and unexpected weight change.  HENT: Negative for congestion, dental problem, ear pain, nosebleeds, postnasal drip, rhinorrhea, sinus pressure, sneezing, sore throat and trouble swallowing.   Eyes: Negative for redness and itching.  Respiratory: Positive for cough and shortness of breath. Negative for chest tightness and wheezing.    Cardiovascular: Negative for palpitations and leg swelling.  Gastrointestinal: Negative for nausea and vomiting.  Genitourinary: Negative for dysuria.  Musculoskeletal: Negative for joint swelling.  Skin: Negative for rash.  Neurological: Negative for headaches.  Hematological: Does not bruise/bleed easily.  Psychiatric/Behavioral: Negative for dysphoric mood. The patient is not nervous/anxious.        Objective:   Physical Exam Vitals:   02/15/17 1443  BP: 122/78  Pulse: 84  SpO2: 98%  Weight: 160 lb 9.6 oz (72.8 kg)  Height: 5\' 6"  (1.676 m)   Gen: Pleasant, well-nourished, in no distress,  normal affect  ENT: No lesions,  mouth clear,  oropharynx clear, no postnasal drip  Lungs: No use of accessory muscles, clear without rales or rhonchi  Cardiovascular: RRR, heart sounds normal, no murmur or gallops, no peripheral edema  Musculoskeletal: No deformities, no cyanosis or clubbing  Neuro: alert, non focal  Skin: Warm, no lesions or rashes       Assessment & Plan:  COPD (chronic obstructive pulmonary disease) We will hold off on adding an every day inhaler at this time.  Please keep albuterol available to use 2 puffs as needed for shortness of breath Continue to work hard on your exercise and weight loss.  Flu shot today.  Follow with Dr Lamonte Sakai in 12 months or sooner if you have any problems  Primary cancer of right lower lobe of lung Following with Dr Julien Nordmann.   Baltazar Apo, MD, PhD 02/15/2017, 3:18 PM [facility] [phone] or if no  answer [phone]

## 2017-02-15 NOTE — Assessment & Plan Note (Signed)
We will hold off on adding an every day inhaler at this time.  Please keep albuterol available to use 2 puffs as needed for shortness of breath Continue to work hard on your exercise and weight loss.  Flu shot today.  Follow with Dr Lamonte Sakai in 12 months or sooner if you have any problems

## 2017-03-29 ENCOUNTER — Encounter: Payer: Self-pay | Admitting: Neurology

## 2017-03-29 ENCOUNTER — Ambulatory Visit (INDEPENDENT_AMBULATORY_CARE_PROVIDER_SITE_OTHER): Payer: Managed Care, Other (non HMO) | Admitting: Neurology

## 2017-03-29 VITALS — BP 149/89 | HR 76 | Ht 65.5 in | Wt 159.5 lb

## 2017-03-29 DIAGNOSIS — E559 Vitamin D deficiency, unspecified: Secondary | ICD-10-CM

## 2017-03-29 DIAGNOSIS — R413 Other amnesia: Secondary | ICD-10-CM

## 2017-03-29 HISTORY — DX: Other amnesia: R41.3

## 2017-03-29 NOTE — Progress Notes (Signed)
Reason for visit: Memory disturbance  Referring physician: Dr. Wenda [name] is a 61 y.o. female  History of present illness:  [name] [name] is a 61 year old right-handed white female with a history of lung cancer, status post resection of the right lower lobe of the lung. The patient underwent chemotherapy for this 3 years ago, so far she has done fairly well. The patient was seen through this office for migraine events in 2014, at that time she also reported some mild problems with memory. The patient has had issues over the years, but she believes that since July 2018 the problems have become more significant. The patient has had several issues leaving the stove on, not locking the house at night, and an some problems with remembering names for people. The patient is able to operate a motor vehicle safely without difficulty or with significant problems with directions. She is able to keep up with finances and pay the bills, and she keeps up with medications and appointments. She denies any headaches, she does have occasional episodes of dizziness. She denies any numbness or weakness of the face, arms, or legs. She denies issues with balance or difficulty controlling the bowels or the bladder. She denies any family history of memory problems. She is sent to this office for an evaluation.  Past Medical History:  Diagnosis Date  . Anxiety    husband also cancer pt  . Cancer ([facility])    lung  . Complication of anesthesia    bronchospasms post-op (pulmonary did consult but pt was discharged later that day)  With last surg. she was given albuterol inhaler & had steroid with surg.    . Congenital renal atrophy    left kidney, one spontaneous stone passed   . COPD (chronic obstructive pulmonary disease) ([facility])   . Degenerative arthritis    spine, hands & knees   . GERD (gastroesophageal reflux disease)   . History of migraine   . Hypertension    h/o PVC- asymptomatic   . Memory  disorder 03/29/2017  . 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 PONV (postoperative nausea and vomiting)   . PVC (premature ventricular contraction)   . Shortness of breath dyspnea    with exertion  . Thyroid nodule    benign by biopsy  . Varicose veins    left leg  . Visual disturbance    Episodic  . Vitamin D deficiency   . Wears glasses     Past Surgical History:  Procedure Laterality Date  . CARPAL TUNNEL RELEASE Left   . CESAREAN SECTION    . COLONOSCOPY W/ BIOPSIES AND POLYPECTOMY    . DILATION AND CURETTAGE OF UTERUS    . TONSILLECTOMY    . VARICOSE VEIN SURGERY     left  . VIDEO ASSISTED THORACOSCOPY (VATS)/ LOBECTOMY Right 07/23/2014   Procedure: VIDEO ASSISTED THORACOSCOPY (VATS)/ LOBECTOMY;  Surgeon: Ivin Poot, MD;  Location: [facility];  Service: Thoracic;  Laterality: Right;  [name] Kitchen VIDEO BRONCHOSCOPY N/A 07/23/2014   Procedure: VIDEO BRONCHOSCOPY;  Surgeon: Ivin Poot, MD;  Location: [facility];  Service: Thoracic;  Laterality: N/A;  . VIDEO BRONCHOSCOPY WITH ENDOBRONCHIAL ULTRASOUND Right 04/17/2014   Procedure: VIDEO BRONCHOSCOPY WITH ENDOBRONCHIAL ULTRASOUND;  Surgeon: Collene Gobble, MD;  Location: [facility];  Service: Thoracic;  Laterality: Right;  [name] Kitchen VIDEO BRONCHOSCOPY WITH ENDOBRONCHIAL ULTRASOUND N/A 07/08/2014   Procedure:  VIDEO BRONCHOSCOPY WITH ENDOBRONCHIAL ULTRASOUND;  Surgeon: Ivin Poot, MD;  Location: [facility];  Service: Thoracic;  Laterality: N/A;    Family History  Problem Relation Age of Onset  . Cancer Mother        cancer of the small intestines  . Hypertension Father   . Atrial fibrillation Father   . Diverticulitis Father   . Hypertension Sister   . Diverticulitis Brother   . Hypertension Sister   . Headache Sister     Social history:  reports that she quit smoking about 4 years ago. Her smoking use included Cigarettes. She has a 20.00 pack-year smoking history. She has  never used smokeless tobacco. She reports that she drinks alcohol. She reports that she does not use drugs.  Medications:  Prior to Admission medications   Medication Sig Start Date End Date Taking? Authorizing Provider  PROAIR HFA 108 (90 Base) MCG/ACT inhaler Inhale 1 puff into the lungs 3 (three) times daily as needed. 05/13/16  Yes [provider]  verapamil (CALAN-SR) 180 MG CR tablet Take 180 mg by mouth at bedtime.   Yes [provider]      Allergies  Allergen Reactions  . Advil [Ibuprofen] Other (See Comments)    Causes hematuria   . Hydrocodone-Guaifenesin Itching  . Sulfa Antibiotics Hives and Swelling  . Temazepam Other (See Comments)    dizziness  . Plasticized Base [Plastibase] Rash    tape  . Zofran [Ondansetron Hcl] Palpitations    ROS:  Out of a complete 14 system review of symptoms, the patient complains only of the following symptoms, and all other reviewed systems are negative.  Fatigue Blurred vision Shortness of breath Increased thirst Joint pain memory loss, headache, dizziness   Blood pressure (!) 149/89, pulse 76, height 5' 5.5" (1.664 m), weight 159 lb 8 oz (72.3 kg).  Physical Exam  General: The patient is alert and cooperative at the time of the examination.  Eyes: Pupils are equal, round, and reactive to light. Discs are flat bilaterally.Venous pulsations are seen bilaterally.  Neck: The neck is supple, no carotid bruits are noted.  Respiratory: The respiratory examination is clear.  Cardiovascular: The cardiovascular examination reveals a regular rate and rhythm, no obvious murmurs or rubs are noted.  Skin: Extremities are without significant edema.  Neurologic Exam  Mental status: The patient is alert and oriented x 3 at the time of the examination. The patient has apparent normal recent and remote memory, with an apparently normal attention span and concentration ability. Mini-Mental status examination done today  shows a total score of 30/30.  Cranial nerves: Facial symmetry is present. There is good sensation of the face to pinprick and soft touch bilaterally. The strength of the facial muscles and the muscles to head turning and shoulder shrug are normal bilaterally. Speech is well enunciated, no aphasia or dysarthria is noted. Extraocular movements are full. Visual fields are full. The tongue is midline, and the patient has symmetric elevation of the soft palate. No obvious hearing deficits are noted.  Motor: The motor testing reveals 5 over 5 strength of all 4 extremities. Good symmetric motor tone is noted throughout.  Sensory: Sensory testing is intact to pinprick, soft touch, vibration sensation, and position sense on all 4 extremities, With exception of some decreased position sense on the right foot. No evidence of extinction is noted.  Coordination: Cerebellar testing reveals good finger-nose-finger and heel-to-shin bilaterally.  Gait and station: Gait is normal. Tandem gait is  unsteady. Romberg is negative. No drift is seen.  Reflexes: Deep tendon reflexes are symmetric and normal bilaterally. Toes are downgoing bilaterally.   CT head 06/27/15:  IMPRESSION: No acute or focal intracranial abnormality. No abnormal enhancement to suggest metastatic disease. No osseous findings of significance.  * CT scan images were reviewed online. I agree with the written report.    Assessment/Plan:  1. Memory disturbance   2. History of lung cancer   The patient reported some mild memory problems a least 4 years ago, this issue has progressed over time. The memory issues predated the diagnosis of cancer and chemotherapy. The patient will be set up for blood work today, she indicates that she has a prior history of vitamin B-12 deficiency. She will undergo MRI of the brain with and without gadolinium enhancement. If the memory issues continue to progress, medication for this such as Aricept may be  started. She will follow-up in 6 months.   Jill Alexanders MD 03/29/2017 2:30 PM  [facility] [address] [place], [place] [address]  Phone [phone] Fax [phone]

## 2017-03-29 NOTE — Patient Instructions (Signed)
   We will get MRI of the brain and get blood work today.

## 2017-03-30 LAB — RPR: RPR Ser Ql: NONREACTIVE

## 2017-03-30 LAB — VITAMIN B12: VITAMIN B 12: 464 pg/mL (ref 232–1245)

## 2017-03-30 LAB — SEDIMENTATION RATE: SED RATE: 7 mm/h (ref 0–40)

## 2017-04-03 LAB — VITAMIN D 1,25 DIHYDROXY
VITAMIN D 1, 25 (OH) TOTAL: 49 pg/mL
Vitamin D3 1, 25 (OH)2: 42 pg/mL

## 2017-04-04 ENCOUNTER — Telehealth: Payer: Self-pay | Admitting: *Deleted

## 2017-04-04 NOTE — Telephone Encounter (Signed)
Called and LVM for pt about unremarkable labs per CW,MD note. Gave [facility] phone number if she has further questions/concerns.

## 2017-04-04 NOTE — Telephone Encounter (Signed)
-----   Message from Kathrynn Ducking, MD sent at 04/03/2017  4:16 PM EDT -----   The blood work results are unremarkable. Please call the patient.  ----- Message ----- From: Lavone Neri Lab Results In Sent: 03/30/2017   5:43 AM To: Kathrynn Ducking, MD

## 2017-04-05 ENCOUNTER — Encounter (HOSPITAL_COMMUNITY): Payer: Self-pay

## 2017-04-05 ENCOUNTER — Emergency Department (HOSPITAL_COMMUNITY)
Admission: EM | Admit: 2017-04-05 | Discharge: 2017-04-06 | Disposition: A | Discharge status: Home / Self Care | Payer: Managed Care, Other (non HMO) | Attending: Emergency Medicine | Admitting: Emergency Medicine

## 2017-04-05 DIAGNOSIS — S61451A Open bite of right hand, initial encounter: Secondary | ICD-10-CM | POA: Insufficient documentation

## 2017-04-05 DIAGNOSIS — Z79899 Other long term (current) drug therapy: Secondary | ICD-10-CM | POA: Diagnosis not present

## 2017-04-05 DIAGNOSIS — Z87891 Personal history of nicotine dependence: Secondary | ICD-10-CM | POA: Insufficient documentation

## 2017-04-05 DIAGNOSIS — Z23 Encounter for immunization: Secondary | ICD-10-CM | POA: Diagnosis not present

## 2017-04-05 DIAGNOSIS — Y929 Unspecified place or not applicable: Secondary | ICD-10-CM | POA: Insufficient documentation

## 2017-04-05 DIAGNOSIS — J449 Chronic obstructive pulmonary disease, unspecified: Secondary | ICD-10-CM | POA: Insufficient documentation

## 2017-04-05 DIAGNOSIS — Y999 Unspecified external cause status: Secondary | ICD-10-CM | POA: Diagnosis not present

## 2017-04-05 DIAGNOSIS — Y939 Activity, unspecified: Secondary | ICD-10-CM | POA: Diagnosis not present

## 2017-04-05 DIAGNOSIS — W5501XA Bitten by cat, initial encounter: Secondary | ICD-10-CM | POA: Diagnosis not present

## 2017-04-05 MED ORDER — TETANUS-DIPHTH-ACELL PERTUSSIS 5-2.5-18.5 LF-MCG/0.5 IM SUSP
0.5000 mL | Freq: Once | INTRAMUSCULAR | Status: AC
  Administered 2017-04-06: 0.5 mL via INTRAMUSCULAR
  Filled 2017-04-05: qty 0.5

## 2017-04-05 MED ORDER — AMOXICILLIN-POT CLAVULANATE 875-125 MG PO TABS: 1.0000 | ORAL_TABLET | Freq: Two times a day (BID) | ORAL | 0 refills | Status: DC

## 2017-04-05 NOTE — ED Notes (Signed)
Bed: [facility]3 Expected date:  Expected time:  Means of arrival:  Comments: 

## 2017-04-05 NOTE — ED Triage Notes (Signed)
Pt has a cat bite to her index finger, the cat is a foster cat and just had it's shots today This has happened before and she saw Dr Amedeo Plenty, pt wants to get an antibiotic and call Dr Amedeo Plenty in the morning

## 2017-04-05 NOTE — Discharge Instructions (Signed)
You are seen today for Bite.  You are given antibiotics.  Monitor for signs and symptoms of worsening infection.  If you have streaking or increasing redness, you need to be reevaluated immediately.

## 2017-04-06 NOTE — ED Provider Notes (Signed)
[facility] DEPT Provider Note   CSN: [id number] Arrival date & time: 04/05/17  2119     History   Chief Complaint Chief Complaint  Patient presents with  . Animal Bite    HPI [name] [name] is a 61 y.o. female.  HPI  This is a 61 year old female who presents with a Bite to the right hand.  Her daughter is fostering 2 kittens when one got spooked.  She had a similar bite in April requiring debridement by Dr. Amedeo Plenty and antibiotics.  She is concerned about possible infection.  She reports throbbing pain to the fingertip.  Most of the bite is on the right second digit.  Unknown last tetanus.  She states she is not sure that she had a tetanus shot in April.  Kittens were vaccinated this morning.  Prior to the incident.  Past Medical History:  Diagnosis Date  . Anxiety    husband also cancer pt  . Cancer ([place])    lung  . Complication of anesthesia    bronchospasms post-op (pulmonary did consult but pt was discharged later that day)  With last surg. she was given albuterol inhaler & had steroid with surg.    . Congenital renal atrophy    left kidney, one spontaneous stone passed   . COPD (chronic obstructive pulmonary disease) ([place])   . Degenerative arthritis    spine, hands & knees   . GERD (gastroesophageal reflux disease)   . History of migraine   . Hypertension    h/o PVC- asymptomatic   . Memory disorder 03/29/2017  . 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 PONV (postoperative nausea and vomiting)   . PVC (premature ventricular contraction)   . Shortness of breath dyspnea    with exertion  . Thyroid nodule    benign by biopsy  . Varicose veins    left leg  . Visual disturbance    Episodic  . Vitamin D deficiency   . Wears glasses     Patient Active Problem List   Diagnosis Date Noted  . Memory disorder 03/29/2017  . S/P lobectomy of lung 07/23/2014    . 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 Primary cancer of right lower lobe of lung ([place]) 04/17/2014  . Hx of tobacco use, presenting hazards to health 04/09/2014  . COPD (chronic obstructive pulmonary disease) ([place]) 04/09/2014  . Cough 04/09/2014  . Migraine equivalent 11/09/2012  . Transient visual loss 01/25/2012    Past Surgical History:  Procedure Laterality Date  . CARPAL TUNNEL RELEASE Left   . CESAREAN SECTION    . COLONOSCOPY W/ BIOPSIES AND POLYPECTOMY    . DILATION AND CURETTAGE OF UTERUS    . TONSILLECTOMY    . VARICOSE VEIN SURGERY     left  . VIDEO ASSISTED THORACOSCOPY (VATS)/ LOBECTOMY Right 07/23/2014   Procedure: VIDEO ASSISTED THORACOSCOPY (VATS)/ LOBECTOMY;  Surgeon: Ivin Poot, MD;  Location: [facility];  Service: Thoracic;  Laterality: Right;  [name] Kitchen VIDEO BRONCHOSCOPY N/A 07/23/2014   Procedure: VIDEO BRONCHOSCOPY;  Surgeon: Ivin Poot, MD;  Location: [facility];  Service: Thoracic;  Laterality: N/A;  . VIDEO BRONCHOSCOPY WITH ENDOBRONCHIAL ULTRASOUND Right 04/17/2014   Procedure: VIDEO BRONCHOSCOPY WITH ENDOBRONCHIAL ULTRASOUND;  Surgeon: Collene Gobble, MD;  Location: [facility];  Service: Thoracic;  Laterality: Right;  [name] Kitchen VIDEO BRONCHOSCOPY WITH ENDOBRONCHIAL ULTRASOUND N/A 07/08/2014   Procedure: VIDEO BRONCHOSCOPY WITH  ENDOBRONCHIAL ULTRASOUND;  Surgeon: Ivin Poot, MD;  Location: [facility];  Service: Thoracic;  Laterality: N/A;    OB History    No data available       Home Medications    Prior to Admission medications   Medication Sig Start Date End Date Taking? Authorizing Provider  amoxicillin-clavulanate (AUGMENTIN) 875-125 MG tablet Take 1 tablet by mouth every 12 (twelve) hours. 04/05/17   Carlee Tesfaye, Barbette Hair, MD  PROAIR HFA 108 [phone] Base) MCG/ACT inhaler Inhale 1 puff into the lungs 3 (three) times daily as needed. 05/13/16   [provider]  verapamil (CALAN-SR) 180 MG CR tablet Take 180 mg by mouth at bedtime.    [provider]    Family  History Family History  Problem Relation Age of Onset  . Cancer Mother        cancer of the small intestines  . Hypertension Father   . Atrial fibrillation Father   . Diverticulitis Father   . Hypertension Sister   . Diverticulitis Brother   . Hypertension Sister   . Headache Sister     Social History Social History  Substance Use Topics  . Smoking status: Former Smoker    Packs/day: 0.50    Years: 40.00    Types: Cigarettes    Quit date: 03/17/2013  . Smokeless tobacco: Never Used  . Alcohol use 0.0 oz/week     Comment: " rare"     Allergies   Advil [ibuprofen]; Hydrocodone-guaifenesin; Sulfa antibiotics; Temazepam; Plasticized base [plastibase]; and Zofran [ondansetron hcl]   Review of Systems Review of Systems  Musculoskeletal:       Finger pain  Skin: Positive for wound. Negative for color change.  All other systems reviewed and are negative.    Physical Exam Updated Vital Signs BP (!) 174/89 (BP Location: Left Arm)   Pulse 92   Temp 98.1 F (36.7 C)   Resp 18   SpO2 96%   Physical Exam  Constitutional: She is oriented to person, place, and time. She appears well-developed and well-nourished.  HENT:  Head: Normocephalic and atraumatic.  Cardiovascular: Normal rate and regular rhythm.   Pulmonary/Chest: Effort normal. No respiratory distress.  Musculoskeletal:  Normal range of motion of all digits of the right hand, there are several puncture wounds on the pad of the right second digit steroids, no active bleeding, neurovascularly intact, no streaking or redness noted  Neurological: She is alert and oriented to person, place, and time.  Skin: Skin is warm and dry.  Psychiatric: She has a normal mood and affect.  Nursing note and vitals reviewed.    ED Treatments / Results  Labs (all labs ordered are listed, but only abnormal results are displayed) Labs Reviewed - No data to display  EKG  EKG Interpretation None       Radiology No  results found.  Procedures Procedures (including critical care time)  Medications Ordered in ED Medications  Tdap (BOOSTRIX) injection 0.5 mL (0.5 mLs Intramuscular Given 04/06/17 0003)     Initial Impression / Assessment and Plan / ED Course  I have reviewed the triage vital signs and the nursing notes.  Pertinent labs & imaging results that were available during my care of the patient were reviewed by me and considered in my medical decision making (see chart for details).     Patient presents with a cat bite to the right hand.  This was irrigated and cleaned.  Will place on Augmentin.  Patient states  that she will call Dr. Amedeo Plenty first thing in the morning.    Tetanus was updated.  Recommend quarantine of the kitten.  Patient was given instructions regarding signs and symptoms of infection.  After history, exam, and medical workup I feel the patient has been appropriately medically screened and is safe for discharge home. Pertinent diagnoses were discussed with the patient. Patient was given return precautions.  Final Clinical Impressions(s) / ED Diagnoses   Final diagnoses:  Cat bite, initial encounter    New Prescriptions Discharge Medication List as of 04/05/2017 11:58 PM    START taking these medications   Details  amoxicillin-clavulanate (AUGMENTIN) 875-125 MG tablet Take 1 tablet by mouth every 12 (twelve) hours., Starting Tue 04/05/2017, Print         Aaralynn Shepheard, Barbette Hair, MD 04/06/17 [phone]

## 2017-04-15 ENCOUNTER — Telehealth: Payer: Self-pay | Admitting: Neurology

## 2017-04-15 NOTE — Telephone Encounter (Signed)
[name] [name] with [facility] emailed me and informed that [name] [name] did not approve the MRI. I called [employer] to check to see if clinical notes were uploaded they stated that clinical notes were upload on 04/13/17 and after reviewing the notes it was denied. If you would like to do a peer to peer the phone number is [phone] and the case number is [id number]. She is schedule to have this exam done on Tuesday 04/19/17 at [facility].

## 2017-04-15 NOTE — Telephone Encounter (Signed)
I called for a peer to peer review. MRI of the brain is approved.  Number is [id number].

## 2017-04-18 NOTE — Telephone Encounter (Signed)
Noted, thank you

## 2017-04-19 ENCOUNTER — Ambulatory Visit
Admission: RE | Admit: 2017-04-19 | Discharge: 2017-04-19 | Disposition: A | Discharge status: Home / Self Care | Payer: Managed Care, Other (non HMO) | Source: Ambulatory Visit | Attending: Neurology | Admitting: Neurology

## 2017-04-19 DIAGNOSIS — R413 Other amnesia: Secondary | ICD-10-CM

## 2017-04-19 MED ORDER — GADOBENATE DIMEGLUMINE 529 MG/ML IV SOLN
14.0000 mL | Freq: Once | INTRAVENOUS | Status: AC | PRN
  Administered 2017-04-19: 14 mL via INTRAVENOUS

## 2017-04-21 ENCOUNTER — Telehealth: Payer: Self-pay | Admitting: Neurology

## 2017-04-21 NOTE — Telephone Encounter (Signed)
I called the patient.  MRI of the brain was normal.  We will follow the memory issues over time.   MRI brain 04/21/17:  IMPRESSION:   Unremarkable MRI brain (with and without). No acute findings. No abnormal enhancing lesions.

## 2017-05-10 ENCOUNTER — Encounter: Payer: Self-pay | Admitting: Emergency Medicine

## 2017-05-10 ENCOUNTER — Ambulatory Visit (INDEPENDENT_AMBULATORY_CARE_PROVIDER_SITE_OTHER): Payer: Managed Care, Other (non HMO) | Admitting: Emergency Medicine

## 2017-05-10 ENCOUNTER — Telehealth: Payer: Self-pay | Admitting: Emergency Medicine

## 2017-05-10 VITALS — BP 120/74 | HR 93 | Ht 65.5 in | Wt 156.0 lb

## 2017-05-10 DIAGNOSIS — J441 Chronic obstructive pulmonary disease with (acute) exacerbation: Secondary | ICD-10-CM | POA: Diagnosis not present

## 2017-05-10 MED ORDER — HYDROCODONE-HOMATROPINE 5-1.5 MG/5ML PO SYRP: 5.0000 mL | ORAL_SOLUTION | Freq: Four times a day (QID) | ORAL | 0 refills | Status: DC | PRN

## 2017-05-10 MED ORDER — PREDNISONE 10 MG PO TABS: ORAL_TABLET | ORAL | 0 refills | Status: DC

## 2017-05-10 NOTE — Telephone Encounter (Signed)
Spoke with pt giving her the instructions of 30mg  x3 days, 20mg  x3 days, 10mg  x3 days, then stop.  Pt expressed understanding. Nothing further needed.

## 2017-05-10 NOTE — Telephone Encounter (Signed)
I had intended for [place] to start with 30mg  x 3 d, then 20mg  x 3d, then 10mg  x 3d, then stop

## 2017-05-10 NOTE — Progress Notes (Signed)
Subjective:    Patient ID: [name] [name], female    DOB: 04-Dec-1955, 61 y.o.   MRN: [id number]  HPI 61 yo woman, former smoker (20 pk-yrs), hx of HTN, migraines. Hx COPD, adenocarcinoma of the lung status post right lower lobectomy.  ROV 07/28/15 -- patient has a history of COPD and adenocarcinoma of the lung. She has undergone VATS right lower lobectomy following neoadjuvant chemotherapy. She is currently on observation. Her last CT scan of the chest was on/20/17 which I personally reviewed and which shows no evidence of recurrent disease. She has been doing well, has not been exercising very much and feels out of shape, has put on about 20 lbs. She not having wheeze, minimal cough.   ROV 08/13/16 -- follow up visit for COPD and adenocarcinoma of the lung. Most recent CT scan of the chest was done on 07/16/16 I have personally reviewed. This shows no evidence of recurrence, stable emphysematous changes. She had an acute exacerbation last Spring, treated with pred and abx. We have discussed scheduled BD's but she has never been on one. She is having dyspnea with walking on an incline. Minimal cough or sputum. She has only used albuterol twice ever  ROV 02/15/17 -- Patient has a history of COPD, lower lobectomy for adenocarcinoma. Most recent CT scan of the chest 01/19/17 was reviewed by me. This shows no evidence for recurrent malignancy. At her last visit in March we discussed Spiriva. She never started it. Unfortunately her husband died since last time. She feels that her breathing is doing well - is able to exert.   ROV 05/10/17 --patient has a history of COPD and adenocarcinoma status post lobectomy.  Most recent CT scan 01/19/17 as above. She had been well until just over a week ago, developed nasal congestion and then cough. She was seen in Urgent Care and was started on azithromycin. Her ProAir use has gone up. No CP. Fevers have resolved.    Review of Systems  Constitutional: Negative for fever  and unexpected weight change.  HENT: Negative for congestion, dental problem, ear pain, nosebleeds, postnasal drip, rhinorrhea, sinus pressure, sneezing, sore throat and trouble swallowing.   Eyes: Negative for redness and itching.  Respiratory: Positive for cough and shortness of breath. Negative for chest tightness and wheezing.   Cardiovascular: Negative for palpitations and leg swelling.  Gastrointestinal: Negative for nausea and vomiting.  Genitourinary: Negative for dysuria.  Musculoskeletal: Negative for joint swelling.  Skin: Negative for rash.  Neurological: Negative for headaches.  Hematological: Does not bruise/bleed easily.  Psychiatric/Behavioral: Negative for dysphoric mood. The patient is not nervous/anxious.        Objective:   Physical Exam Vitals:   05/10/17 1219  BP: 120/74  Pulse: 93  SpO2: 95%  Weight: 156 lb (70.8 kg)  Height: 5' 5.5" (1.664 m)   Gen: Pleasant, well-nourished, in no distress,  normal affect  ENT: No lesions,  mouth clear,  oropharynx clear, no postnasal drip, upper airway noise with inspiration and expiration  Lungs: No use of accessory muscles, lateral rhonchi, no active wheezing  Cardiovascular: RRR, heart sounds normal, no murmur or gallops, no peripheral edema  Musculoskeletal: No deformities, no cyanosis or clubbing  Neuro: alert, non focal  Skin: Warm, no lesions or rashes       Assessment & Plan:  COPD (chronic obstructive pulmonary disease) Consistent with a mild exacerbation in the setting of an apparent upper respiratory infection.  She has more upper airway noise than  evidence for true bronchospasm.  There are bilateral rhonchi present.  I believe that she would benefit from treatment with corticosteroids.  She is completing azithromycin.  Not clear to me that she needs to be on scheduled bronchodilators at this time.  Depending on the frequency of her exacerbations we may decide to add LABA / ICS at some point.  Try  using Tylenol Cold and Flu for symptoms relief.  Please finish your azithromycin as planned Please take prednisone as directed until completely gone.  Use hycodan 5cc up to every 6 hours if needed for cough suppression. Call our office if you have any itching, rahe or breathing problems with this medication.  Take albuterol 2 puffs up to every 4 hours if needed for shortness of breath.  Follow with Dr Lamonte Sakai in 6 months or sooner if you have any problems.   Baltazar Apo, MD, PhD 05/10/2017, 12:52 PM [facility] [phone] or if no answer [phone]

## 2017-05-10 NOTE — Patient Instructions (Addendum)
Try using Tylenol Cold and Flu for symptoms relief.  Please finish your azithromycin as planned Please take prednisone as directed until completely gone.  Use hycodan 5cc up to every 6 hours if needed for cough suppression. Call our office if you have any itching, rahe or breathing problems with this medication.  Take albuterol 2 puffs up to every 4 hours if needed for shortness of breath.  Follow with Dr Lamonte Sakai in 6 months or sooner if you have any problems.

## 2017-05-10 NOTE — Assessment & Plan Note (Signed)
Consistent with a mild exacerbation in the setting of an apparent upper respiratory infection.  She has more upper airway noise than evidence for true bronchospasm.  There are bilateral rhonchi present.  I believe that she would benefit from treatment with corticosteroids.  She is completing azithromycin.  Not clear to me that she needs to be on scheduled bronchodilators at this time.  Depending on the frequency of her exacerbations we may decide to add LABA / ICS at some point.  Try using Tylenol Cold and Flu for symptoms relief.  Please finish your azithromycin as planned Please take prednisone as directed until completely gone.  Use hycodan 5cc up to every 6 hours if needed for cough suppression. Call our office if you have any itching, rahe or breathing problems with this medication.  Take albuterol 2 puffs up to every 4 hours if needed for shortness of breath.  Follow with Dr Lamonte Sakai in 6 months or sooner if you have any problems.

## 2017-05-10 NOTE — Telephone Encounter (Signed)
Spoke with pt who stated that she did not know she was going to be having a 12-day course of the prednisone with starting with 40mg . Pt wanted to know if we could start with a lower dose or if this is really what pt needs to do.  Dr. Lamonte Sakai, please advise on this.  Thanks!

## 2017-06-24 DIAGNOSIS — M79644 Pain in right finger(s): Secondary | ICD-10-CM

## 2017-06-24 HISTORY — DX: Pain in right finger(s): M79.644

## 2017-07-11 ENCOUNTER — Other Ambulatory Visit: Payer: Managed Care, Other (non HMO)

## 2017-07-18 ENCOUNTER — Ambulatory Visit: Payer: Managed Care, Other (non HMO) | Admitting: Internal Medicine

## 2017-07-25 ENCOUNTER — Ambulatory Visit
Admission: RE | Admit: 2017-07-25 | Discharge: 2017-07-25 | Disposition: A | Discharge status: Home / Self Care | Payer: Managed Care, Other (non HMO) | Source: Ambulatory Visit | Attending: Internal Medicine | Admitting: Internal Medicine

## 2017-07-25 ENCOUNTER — Inpatient Hospital Stay: Payer: Managed Care, Other (non HMO) | Attending: Internal Medicine

## 2017-07-25 ENCOUNTER — Other Ambulatory Visit: Payer: Managed Care, Other (non HMO)

## 2017-07-25 DIAGNOSIS — C3431 Malignant neoplasm of lower lobe, right bronchus or lung: Secondary | ICD-10-CM

## 2017-07-25 DIAGNOSIS — Z85118 Personal history of other malignant neoplasm of bronchus and lung: Secondary | ICD-10-CM | POA: Insufficient documentation

## 2017-07-25 DIAGNOSIS — I700 Atherosclerosis of aorta: Secondary | ICD-10-CM | POA: Insufficient documentation

## 2017-07-25 DIAGNOSIS — I2510 Atherosclerotic heart disease of native coronary artery without angina pectoris: Secondary | ICD-10-CM | POA: Insufficient documentation

## 2017-07-25 LAB — CBC WITH DIFFERENTIAL/PLATELET
Basophils Absolute: 0 10*3/uL (ref 0.0–0.1)
Basophils Relative: 0 %
EOS PCT: 3 %
Eosinophils Absolute: 0.2 10*3/uL (ref 0.0–0.5)
HCT: 42.8 % (ref 34.8–46.6)
HEMOGLOBIN: 13.9 g/dL (ref 11.6–15.9)
LYMPHS ABS: 1.2 10*3/uL (ref 0.9–3.3)
LYMPHS PCT: 21 %
MCH: 29.8 pg (ref 25.1–34.0)
MCHC: 32.5 g/dL (ref 31.5–36.0)
MCV: 91.6 fL (ref 79.5–101.0)
Monocytes Absolute: 0.5 10*3/uL (ref 0.1–0.9)
Monocytes Relative: 8 %
NEUTROS PCT: 68 %
Neutro Abs: 4.1 10*3/uL (ref 1.5–6.5)
Platelets: 257 10*3/uL (ref 145–400)
RBC: 4.67 MIL/uL (ref 3.70–5.45)
RDW: 13.3 % (ref 11.2–14.5)
WBC: 6 10*3/uL (ref 3.9–10.3)

## 2017-07-25 LAB — COMPREHENSIVE METABOLIC PANEL
ALT: 15 U/L (ref 0–55)
AST: 17 U/L (ref 5–34)
Albumin: 3.8 g/dL (ref 3.5–5.0)
Alkaline Phosphatase: 128 U/L (ref 40–150)
Anion gap: 8 (ref 3–11)
BUN: 12 mg/dL (ref 7–26)
CHLORIDE: 107 mmol/L (ref 98–109)
CO2: 26 mmol/L (ref 22–29)
Calcium: 9.6 mg/dL (ref 8.4–10.4)
Creatinine, Ser: 0.8 mg/dL (ref 0.60–1.10)
GFR calc Af Amer: >60 mL/min (ref >60)
GFR calc non Af Amer: >60 mL/min (ref >60)
Glucose, Bld: 98 mg/dL (ref 70–140)
POTASSIUM: 3.9 mmol/L (ref 3.5–5.1)
Sodium: 141 mmol/L (ref 136–145)
Total Bilirubin: 0.4 mg/dL (ref 0.2–1.2)
Total Protein: 7 g/dL (ref 6.4–8.3)

## 2017-07-25 MED ORDER — IOPAMIDOL (ISOVUE-300) INJECTION 61%
75.0000 mL | Freq: Once | INTRAVENOUS | Status: AC | PRN
  Administered 2017-07-25: 75 mL via INTRAVENOUS

## 2017-08-01 ENCOUNTER — Inpatient Hospital Stay (HOSPITAL_BASED_OUTPATIENT_CLINIC_OR_DEPARTMENT_OTHER): Payer: Managed Care, Other (non HMO) | Admitting: Internal Medicine

## 2017-08-01 ENCOUNTER — Telehealth: Payer: Self-pay | Admitting: Internal Medicine

## 2017-08-01 ENCOUNTER — Encounter: Payer: Self-pay | Admitting: Internal Medicine

## 2017-08-01 DIAGNOSIS — I700 Atherosclerosis of aorta: Secondary | ICD-10-CM

## 2017-08-01 DIAGNOSIS — I2510 Atherosclerotic heart disease of native coronary artery without angina pectoris: Secondary | ICD-10-CM | POA: Diagnosis not present

## 2017-08-01 DIAGNOSIS — Z85118 Personal history of other malignant neoplasm of bronchus and lung: Secondary | ICD-10-CM | POA: Diagnosis not present

## 2017-08-01 DIAGNOSIS — C3490 Malignant neoplasm of unspecified part of unspecified bronchus or lung: Secondary | ICD-10-CM

## 2017-08-01 NOTE — Progress Notes (Signed)
[facility] [phone]   [phone]) [phone]  OFFICE PROGRESS NOTE  [name] [name], [place] [address]  DIAGNOSIS: Primary cancer of right lower lobe of lung  Staging form: Lung, AJCC 7th Edition  Clinical: Stage IIIA (T2a, N2, M0) - Unsigned  Staging comments: Adenocarcinoma  Foundation 1 molecular studies: positive for [id number], KRASG13C, [id number]*, [id number]*11, GATA6 amplification and [id number] Negative for BRAF, ALK, MET, RET and ERBB2  PRIOR THERAPY:  1) Concurrent chemoradiation with chemotherapy in the form of weekly carboplatin for an AUC of 2 and paclitaxel 45 mg/m given concurrent with radiation 2) Right VATS (video-assisted thoracoscopic surgery) with right lower lobectomy and mediastinal lymph node dissection under the care of [name] 07/23/2014. The final pathology showed residual 1.8 cm moderate to poorly differentiated invasive adenocarcinoma with no pleural or lymphovascular invasion. The dissected lymph nodes were negative for malignancy and the final pathologic stage was (pT1a, pN0, pMx).  CURRENT THERAPY: Observation.   INTERVAL HISTORY: [name] BUTTACAVOLI89y.o. female returns to the clinic today for 6 months follow-up visit.  The patient is feeling fine today with no specific complaints except for shortness of breath with exertion.  She denied having any recent chest pain, cough or hemoptysis.  She denied having any weight loss or night sweats.  She actually gained around 8 pounds since her last visit.  She denied having any fever or chills.  She has no nausea, vomiting, diarrhea or constipation.  The patient had repeat CT scan of the chest performed recently and she is here for evaluation and discussion of her risk her results.  MEDICAL HISTORY: Past Medical History:  Diagnosis Date  . Anxiety    husband also cancer pt  . [place]  . Complication of anesthesia    bronchospasms post-op (pulmonary did consult but pt was discharged later that day)  With last surg. she was given albuterol inhaler & had steroid with surg.    . Congenital renal atrophy    left kidney, one spontaneous stone passed   . COPD (chronic obstructive pulmonary disease) ([place]   . Degenerative arthritis    spine, hands & knees   . GERD (gastroesophageal reflux disease)   . History of migraine   . Hypertension    h/o PVC- asymptomatic   . Memory disorder 03/29/2017  . 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 PONV (postoperative nausea and vomiting)   . PVC (premature ventricular contraction)   . Shortness of breath dyspnea    with exertion  . Thyroid nodule    benign by biopsy  . Varicose veins    left leg  . Visual disturbance    Episodic  . Vitamin D deficiency   . Wears glasses     ALLERGIES:  is allergic to advil [ibuprofen]; hydrocodone-guaifenesin; sulfa antibiotics; temazepam; plasticized base [plastibase]; and zofran [ondansetron hcl].  MEDICATIONS:  Current Outpatient Medications  Medication Sig Dispense Refill  . PROAIR HFA 108 (90 Base) MCG/ACT inhaler Inhale 1 puff into the lungs 3 (three) times daily as needed.    . verapamil (CALAN-SR) 180 MG CR tablet Take 180 mg by mouth at bedtime.    .[name] KitchenHYDROcodone-homatropine (HYCODAN) 5-1.5 MG/5ML syrup Take 5 mLs by mouth every 6 (six) hours as needed for cough. (Patient not taking: Reported on 08/01/2017) 120 mL 0  Current Facility-Administered Medications  Medication Dose Route Frequency Provider Last Rate Last Dose  . Ampicillin-Sulbactam (UNASYN) 3 g in sodium chloride 0.9 % 100 mL IVPB  3 g Intravenous Once Avelina Laine, PA-C        SURGICAL HISTORY:  Past Surgical History:  Procedure Laterality Date  . CARPAL TUNNEL RELEASE Left   . CESAREAN SECTION    . COLONOSCOPY W/ BIOPSIES AND POLYPECTOMY    . DILATION AND CURETTAGE OF  UTERUS    . TONSILLECTOMY    . VARICOSE VEIN SURGERY     left  . VIDEO ASSISTED THORACOSCOPY (VATS)/ LOBECTOMY Right 07/23/2014   Procedure: VIDEO ASSISTED THORACOSCOPY (VATS)/ LOBECTOMY;  Surgeon: Ivin Poot, MD;  Location: [facility];  Service: Thoracic;  Laterality: Right;  [name] Kitchen VIDEO BRONCHOSCOPY N/A 07/23/2014   Procedure: VIDEO BRONCHOSCOPY;  Surgeon: Ivin Poot, MD;  Location: [facility];  Service: Thoracic;  Laterality: N/A;  . VIDEO BRONCHOSCOPY WITH ENDOBRONCHIAL ULTRASOUND Right 04/17/2014   Procedure: VIDEO BRONCHOSCOPY WITH ENDOBRONCHIAL ULTRASOUND;  Surgeon: Collene Gobble, MD;  Location: [facility];  Service: Thoracic;  Laterality: Right;  [name] Kitchen VIDEO BRONCHOSCOPY WITH ENDOBRONCHIAL ULTRASOUND N/A 07/08/2014   Procedure: VIDEO BRONCHOSCOPY WITH ENDOBRONCHIAL ULTRASOUND;  Surgeon: Ivin Poot, MD;  Location[facility];  Service: Thoracic;  Laterality: N/A;    REVIEW OF SYSTEMS:  A comprehensive review of systems was negative except for: Respiratory: positive for dyspnea on exertion   PHYSICAL EXAMINATION: General appearance: alert, cooperative and no distress Head: Normocephalic, without obvious abnormality, atraumatic Neck: no adenopathy, no JVD, supple, symmetrical, trachea midline and thyroid not enlarged, symmetric, no tenderness/mass/nodules Lymph nodes: Cervical, supraclavicular, and axillary nodes normal. Resp: clear to auscultation bilaterally Back: symmetric, no curvature. ROM normal. No CVA tenderness. Cardio: regular rate and rhythm, S1, S2 normal, no murmur, click, rub or gallop GI: soft, non-tender; bowel sounds normal; no masses,  no organomegaly Extremities: extremities normal, atraumatic, no cyanosis or edema  ECOG PERFORMANCE STATUS: 0 - Asymptomatic  Blood pressure (!) 150/77, pulse 79, temperature 98.2 F (36.8 C), temperature source Oral, resp. rate 18, height 5' 5.5" (1.664 m), weight 162 lb 8 oz (73.7 kg), SpO2 97 %.  LABORATORY DATA: Lab Results  Component Value  Date   WBC 6.0 07/25/2017   HGB 13.9 07/25/2017   HCT 42.8 07/25/2017   MCV 91.6 07/25/2017   PLT 257 07/25/2017      Chemistry      Component Value Date/Time   NA 141 07/25/2017 1005   NA 142 01/19/2017 0958   K 3.9 07/25/2017 1005   K 4.3 01/19/2017 0958   CL 107 07/25/2017 1005   CO2 26 07/25/2017 1005   CO2 25 01/19/2017 0958   BUN 12 07/25/2017 1005   BUN 15.2 01/19/2017 0958   CREATININE 0.80 07/25/2017 1005   CREATININE 0.8 01/19/2017 0958      Component Value Date/Time   CALCIUM 9.6 07/25/2017 1005   CALCIUM 9.5 01/19/2017 0958   ALKPHOS 128 07/25/2017 1005   ALKPHOS 121 01/19/2017 0958   AST 17 07/25/2017 1005   AST 19 01/19/2017 0958   ALT 15 07/25/2017 1005   ALT 20 01/19/2017 0958   BILITOT 0.4 07/25/2017 1005   BILITOT 0.64 01/19/2017 0958       RADIOGRAPHIC STUDIES: Ct Chest W Contrast  Result Date: 07/25/2017 CLINICAL DATA:  62 year old female with history of lung cancer. Semiannual follow-up examination. EXAM: CT CHEST WITH CONTRAST TECHNIQUE: Multidetector CT imaging of the chest was  performed during intravenous contrast administration. CONTRAST:  52m ISOVUE-300 IOPAMIDOL (ISOVUE-300) INJECTION 61% COMPARISON:  Chest CT 01/19/2017. FINDINGS: Cardiovascular: Heart size is normal. There is no significant pericardial fluid, thickening or pericardial calcification. There is aortic atherosclerosis, as well as atherosclerosis of the great vessels of the mediastinum and the coronary arteries, including calcified atherosclerotic plaque in the left anterior descending and right coronary arteries. Mitral annular calcifications. Mediastinum/Nodes: No pathologically enlarged mediastinal or hilar lymph nodes. Esophagus is unremarkable in appearance. No axillary lymphadenopathy. Lungs/Pleura: Status post right lower lobectomy. Compensatory hyperexpansion of the right middle and upper lobes. Diffuse bronchial wall thickening with moderate centrilobular and paraseptal  emphysema. No acute consolidative airspace disease. No pleural effusions. No suspicious appearing pulmonary nodules or masses. Linear scarring in the lingula, similar to the prior study. Upper Abdomen: 1.6 cm simple cyst in segment 2 of the liver. Other subcentimeter low-attenuation lesion in segment 2 of the liver is too small to definitively characterize, but similar to prior studies, statistically likely a tiny cyst. Musculoskeletal: There are no aggressive appearing lytic or blastic lesions noted in the visualized portions of the skeleton. IMPRESSION: 1. Status post right lower lobectomy. No findings to suggest local recurrence of disease or definite metastatic disease in the thorax. 2. Aortic atherosclerosis, in addition to 2 vessel coronary artery disease. Please note that although the presence of coronary artery calcium documents the presence of coronary artery disease, the severity of this disease and any potential stenosis cannot be assessed on this non-gated CT examination. Assessment for potential risk factor modification, dietary therapy or pharmacologic therapy may be warranted, if clinically indicated. 3. There are calcifications of the mitral annulus. Echocardiographic correlation for evaluation of potential valvular dysfunction may be warranted if clinically indicated. 4. Diffuse bronchial wall thickening with moderate centrilobular and paraseptal emphysema; imaging findings suggestive of underlying COPD. Aortic Atherosclerosis (ICD10-I70.0) and Emphysema (ICD10-J43.9). Electronically Signed   By: DVinnie LangtonM.D.   On: 07/25/2017 14:43    ASSESSMENT AND PLAN:  This is a very pleasant 62years old white female with a stage IIIA non-small cell lung cancer status post neoadjuvant concurrent chemoradiation with weekly carboplatin and paclitaxel followed by right lower lobectomy and lymph node dissection with the final pathology consistent with a pathological stage pT1a, pN0. The patient has  been in observation for the last 3 years and she is feeling fine. The recent CT scan of the chest showed no concerning findings for disease recurrence but the patient has coronary atherosclerosis. I discussed the scan results with the patient and recommended for her to continue in observation with repeat CT scan of the chest in 6 months. For the coronary and aortic atherosclerosis, I strongly advised the patient to schedule an appointment with her cardiologist for further evaluation of her current treatment. The patient was advised to call immediately if she has any concerning symptoms in the interval. The patient voices understanding of current disease status and treatment options and is in agreement with the current care plan.  All questions were answered. The patient knows to call the clinic with any problems, questions or concerns. We can certainly see the patient much sooner if necessary. I spent 10 minutes counseling the patient face to face. The total time spent in the appointment was 15 minutes.  Disclaimer: This note was dictated with voice recognition software. Similar sounding words can inadvertently be transcribed and may not be corrected upon review.

## 2017-08-01 NOTE — Telephone Encounter (Signed)
Scheduled appt per 2/25 los - Gave patient AVS and calender per los.

## 2017-08-10 ENCOUNTER — Encounter: Payer: Self-pay | Admitting: Interventional Cardiology

## 2017-08-15 NOTE — Progress Notes (Signed)
Cardiology Office Note    Date:  08/16/2017   ID:  [name] [name], DOB February 28, 1956, MRN [id number]  PCP:  [name] Hatchet, MD  Cardiologist: [name] Grooms, MD   No chief complaint on file.   History of Present Illness:  [name] [name] is a 62 y.o. female referred by Dr. Ardeth [name] after CT scan identified CA calcification in the patient with prior smoking and CAD family history (father).   Since December, [name] [name] has noted increased fatigue and dyspnea with her typical neighborhood walk.  She walks every day with her dogs.  She has become winded.  She also notes some vague chest tightness she lifts her CAD and carious the cat upstairs at home.  This is also new.  No discomfort occurs at rest.  Otherwise there are no complaints.  Intensity of discomfort is 3/10.  Tightness may last up to 30 minutes.  Recent CT scan demonstrated three-vessel coronary calcification.  He has been present on each CT scan performed since 2015.  History of lung cancer, prior smoker.   Past Medical History:  Diagnosis Date  . Anxiety    husband also cancer pt  . Cancer ([place])    lung  . Complication of anesthesia    bronchospasms post-op (pulmonary did consult but pt was discharged later that day)  With last surg. she was given albuterol inhaler & had steroid with surg.    . Congenital renal atrophy    left kidney, one spontaneous stone passed   . COPD (chronic obstructive pulmonary disease) ([name] [name])   . Degenerative arthritis    spine, hands & knees   . GERD (gastroesophageal reflux disease)   . History of migraine   . Hypertension    h/o PVC- asymptomatic   . Memory disorder 03/29/2017  . 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 PONV (postoperative nausea and vomiting)   . PVC (premature ventricular contraction)   . Shortness of breath dyspnea    with exertion  . Thyroid nodule    benign by biopsy  . Varicose veins     left leg  . Visual disturbance    Episodic  . Vitamin D deficiency   . Wears glasses     Past Surgical History:  Procedure Laterality Date  . CARPAL TUNNEL RELEASE Left   . CESAREAN SECTION    . COLONOSCOPY W/ BIOPSIES AND POLYPECTOMY    . DILATION AND CURETTAGE OF UTERUS    . TONSILLECTOMY    . VARICOSE VEIN SURGERY     left  . VIDEO ASSISTED THORACOSCOPY (VATS)/ LOBECTOMY Right 07/23/2014   Procedure: VIDEO ASSISTED THORACOSCOPY (VATS)/ LOBECTOMY;  Surgeon: Ivin Poot, MD;  Location: [facility];  Service: Thoracic;  Laterality: Right;  [name] Kitchen VIDEO BRONCHOSCOPY N/A 07/23/2014   Procedure: VIDEO BRONCHOSCOPY;  Surgeon: Ivin Poot, MD;  Location: [facility];  Service: Thoracic;  Laterality: N/A;  . VIDEO BRONCHOSCOPY WITH ENDOBRONCHIAL ULTRASOUND Right 04/17/2014   Procedure: VIDEO BRONCHOSCOPY WITH ENDOBRONCHIAL ULTRASOUND;  Surgeon: Collene Gobble, MD;  Location: [facility];  Service: Thoracic;  Laterality: Right;  [name] Kitchen VIDEO BRONCHOSCOPY WITH ENDOBRONCHIAL ULTRASOUND N/A 07/08/2014   Procedure: VIDEO BRONCHOSCOPY WITH ENDOBRONCHIAL ULTRASOUND;  Surgeon: Ivin Poot, MD;  Location: [facility];  Service: Thoracic;  Laterality: N/A;    Current Medications: Outpatient Medications Prior to Visit  Medication Sig Dispense Refill  . albuterol (PROAIR HFA) 108 (90 Base)  MCG/ACT inhaler Inhale 1 puff into the lungs 3 (three) times daily as needed for wheezing or shortness of breath.    . verapamil (CALAN-SR) 180 MG CR tablet Take 180 mg by mouth at bedtime.    [name] Kitchen HYDROcodone-homatropine (HYCODAN) 5-1.5 MG/5ML syrup Take 5 mLs by mouth every 6 (six) hours as needed for cough. (Patient not taking: Reported on 08/01/2017) 120 mL 0  . PROAIR HFA 108 (90 Base) MCG/ACT inhaler Inhale 1 puff into the lungs 3 (three) times daily as needed.     Facility-Administered Medications Prior to Visit  Medication Dose Route Frequency Provider Last Rate Last Dose  . Ampicillin-Sulbactam (UNASYN) 3 g in sodium chloride 0.9  % 100 mL IVPB  3 g Intravenous Once Avelina Laine, PA-C         Allergies:   Advil [ibuprofen]; Hydrocodone-guaifenesin; Sulfa antibiotics; Temazepam; Plasticized base [plastibase]; and Zofran [ondansetron hcl]   Social History   Socioeconomic History  . Marital status: Widowed    Spouse name: None  . Number of children: 1  . Years of education: 48  . Highest education level: None  Social Needs  . Financial resource strain: None  . Food insecurity - worry: None  . Food insecurity - inability: None  . Transportation needs - medical: None  . Transportation needs - non-medical: None  Occupational History  . Occupation: Nurse    Employer: [employer]  Tobacco Use  . Smoking status: Former Smoker    Packs/day: 0.50    Years: 40.00    Pack years: 20.00    Types: Cigarettes    Last attempt to quit: 03/17/2013    Years since quitting: 4.4  . Smokeless tobacco: Never Used  Substance and Sexual Activity  . Alcohol use: Yes    Alcohol/week: 0.0 oz    Comment: " rare"  . Drug use: No  . Sexual activity: None  Other Topics Concern  . None  Social History Narrative   Lives alone. Husband passed away 10-08-2016   Caffeine use: 1-2 cups coffee per day   Right handed      Family History:  The patient's family history includes Atrial fibrillation in her father; Cancer in her mother; Diverticulitis in her brother and father; Headache in her sister; Hypertension in her father, sister, and sister.   ROS:   Please see the history of present illness.    No other complaints. All other systems reviewed and are negative.   PHYSICAL EXAM:   VS:  BP (!) 146/82   Pulse 82   Ht 5' 5.5" (1.664 m)   Wt 161 lb (73 kg)   BMI 26.38 kg/m    GEN: Well nourished, well developed, in no acute distress  HEENT: normal  Neck: no JVD, carotid bruits, or masses Cardiac: RRR; no murmurs, rubs, or gallops,no edema  Respiratory:  clear to auscultation bilaterally, normal work of breathing GI:  soft, nontender, nondistended, + BS MS: no deformity or atrophy  Skin: warm and dry, no rash Neuro:  Alert and Oriented x 3, Strength and sensation are intact Psych: euthymic mood, full affect  Wt Readings from Last 3 Encounters:  08/16/17 161 lb (73 kg)  08/01/17 162 lb 8 oz (73.7 kg)  05/10/17 156 lb (70.8 kg)      Studies/Labs Reviewed:   EKG:  EKG normal sinus rhythm at 82 bpm, QS pattern V1 to V2, nonspecific T wave flattening and nonspecific ST-T abnormality.  No change compared to prior tracing.  Recent  Labs: 07/25/2017: ALT 15; BUN 12; Creatinine, Ser 0.80; Hemoglobin 13.9; Platelets 257; Potassium 3.9; Sodium 141   Lipid Panel No results found for: CHOL, TRIG, HDL, CHOLHDL, VLDL, LDLCALC, LDLDIRECT  Additional studies/ records that were reviewed today include:  CT Chest 07/25/17: IMPRESSION: 1. Status post right lower lobectomy. No findings to suggest local recurrence of disease or definite metastatic disease in the thorax. 2. Aortic atherosclerosis, in addition to 2 vessel coronary artery disease. Please note that although the presence of coronary artery calcium documents the presence of coronary artery disease, the severity of this disease and any potential stenosis cannot be assessed on this non-gated CT examination. Assessment for potential risk factor modification, dietary therapy or pharmacologic therapy may be warranted, if clinically indicated. 3. There are calcifications of the mitral annulus. Echocardiographic correlation for evaluation of potential valvular dysfunction may be warranted if clinically indicated. 4. Diffuse bronchial wall thickening with moderate centrilobular and paraseptal emphysema; imaging findings suggestive of underlying COPD.   Aortic Atherosclerosis (ICD10-I70.0) and Emphysema (ICD10-J43.9).       ASSESSMENT:    1. Coronary artery calcification seen on CT scan   2. Primary cancer of right lower lobe of lung ([place])   3.  Hyperlipidemia with target LDL less than 70   4. Hx of tobacco use, presenting hazards to health   5. Frequent PVCs   6. Chronic obstructive pulmonary disease, unspecified COPD type ([place])      PLAN:  In order of problems listed above:  1. Coronary artery disease as documented by recent chest CT and prior studies.  Symptoms are worrisome.  Stress myocardial perfusion/Myoview.  Primary prevention against cardiac events to include rosuvastatin 10 mg daily, aspirin 81 mg/day, and medication adjustment to add beta-blocker and remove calcium channel blocker may be considered depending upon findings. 2. Stable/quiescent 3. LDL target less than 70.  Most recently 104 less than a year ago. 4. No longer smoking 5. Not currently an issue 6. Documented by past history.  Stress test to rule out high risk subset.  Add statin therapy and aspirin.  Further management depending upon scan findings.    Medication Adjustments/Labs and Tests Ordered: Current medicines are reviewed at length with the patient today.  Concerns regarding medicines are outlined above.  Medication changes, Labs and Tests ordered today are listed in the Patient Instructions below. Patient Instructions  Medication Instructions:  1) START Aspirin 81mg  once daily 2) START Rosuvastatin 10mg  once daily  Labwork: Your physician recommends that you return for lab work in: 6-8 weeks (Liver and Lipid).  Please make sure you come fasting for these labs.    Testing/Procedures: Your physician has requested that you have en exercise stress myoview. For further information please visit [email or web address]. Please follow instruction sheet, as given.   Follow-Up: Your physician recommends that you schedule a follow-up appointment as needed with Dr. Tamala Julian.    Any Other Special Instructions Will Be Listed Below (If Applicable).     If you need a refill on your cardiac medications before your next appointment, please call your  pharmacy.      Signed, [name] Grooms, MD  08/16/2017 11:16 AM    [facility] [place], [place],   [address] Phone: [phone]; Fax: [phone]

## 2017-08-16 ENCOUNTER — Encounter: Payer: Self-pay | Admitting: *Deleted

## 2017-08-16 ENCOUNTER — Encounter: Payer: Self-pay | Admitting: Interventional Cardiology

## 2017-08-16 ENCOUNTER — Ambulatory Visit (INDEPENDENT_AMBULATORY_CARE_PROVIDER_SITE_OTHER): Payer: Managed Care, Other (non HMO) | Admitting: Interventional Cardiology

## 2017-08-16 VITALS — BP 146/82 | HR 82 | Ht 65.5 in | Wt 161.0 lb

## 2017-08-16 DIAGNOSIS — E785 Hyperlipidemia, unspecified: Secondary | ICD-10-CM

## 2017-08-16 DIAGNOSIS — C3431 Malignant neoplasm of lower lobe, right bronchus or lung: Secondary | ICD-10-CM

## 2017-08-16 DIAGNOSIS — I493 Ventricular premature depolarization: Secondary | ICD-10-CM | POA: Diagnosis not present

## 2017-08-16 DIAGNOSIS — Z87891 Personal history of nicotine dependence: Secondary | ICD-10-CM

## 2017-08-16 DIAGNOSIS — I2510 Atherosclerotic heart disease of native coronary artery without angina pectoris: Secondary | ICD-10-CM

## 2017-08-16 DIAGNOSIS — J449 Chronic obstructive pulmonary disease, unspecified: Secondary | ICD-10-CM

## 2017-08-16 MED ORDER — ASPIRIN EC 81 MG PO TBEC: 81.0000 mg | DELAYED_RELEASE_TABLET | Freq: Every day | ORAL | 3 refills | Status: DC

## 2017-08-16 MED ORDER — ROSUVASTATIN CALCIUM 10 MG PO TABS: 10.0000 mg | ORAL_TABLET | Freq: Every day | ORAL | 3 refills | Status: DC

## 2017-08-16 NOTE — Patient Instructions (Signed)
Medication Instructions:  1) START Aspirin 81mg  once daily 2) START Rosuvastatin 10mg  once daily  Labwork: Your physician recommends that you return for lab work in: 6-8 weeks (Liver and Lipid).  Please make sure you come fasting for these labs.    Testing/Procedures: Your physician has requested that you have en exercise stress myoview. For further information please visit [email or web address]. Please follow instruction sheet, as given.   Follow-Up: Your physician recommends that you schedule a follow-up appointment as needed with Dr. Tamala Julian.    Any Other Special Instructions Will Be Listed Below (If Applicable).     If you need a refill on your cardiac medications before your next appointment, please call your pharmacy.

## 2017-08-25 ENCOUNTER — Telehealth (HOSPITAL_COMMUNITY): Payer: Self-pay | Admitting: *Deleted

## 2017-08-25 NOTE — Telephone Encounter (Signed)

## 2017-08-30 ENCOUNTER — Ambulatory Visit (HOSPITAL_COMMUNITY): Payer: Managed Care, Other (non HMO) | Attending: Cardiovascular Disease

## 2017-08-30 DIAGNOSIS — I2510 Atherosclerotic heart disease of native coronary artery without angina pectoris: Secondary | ICD-10-CM | POA: Diagnosis present

## 2017-08-30 LAB — MYOCARDIAL PERFUSION IMAGING
CHL CUP MPHR: 157 {beats}/min
CHL CUP NUCLEAR SDS: 1
CHL CUP NUCLEAR SSS: 10
CHL CUP RESTING HR STRESS: 74 {beats}/min
CSEPED: 4 min
CSEPEW: 6.4 METS
CSEPHR: 100 %
Exercise duration (sec): 30 s
LHR: 0.31
LV sys vol: 60 mL
LVDIAVOL: 109 mL (ref 46–106)
Peak HR: 157 {beats}/min
SRS: 9
TID: 0.92

## 2017-08-30 MED ORDER — TECHNETIUM TC 99M TETROFOSMIN IV KIT
10.7000 | PACK | Freq: Once | INTRAVENOUS | Status: AC | PRN
  Administered 2017-08-30: 10.7 via INTRAVENOUS
  Filled 2017-08-30: qty 11

## 2017-08-30 MED ORDER — TECHNETIUM TC 99M TETROFOSMIN IV KIT
31.6000 | PACK | Freq: Once | INTRAVENOUS | Status: AC | PRN
  Administered 2017-08-30: 31.6 via INTRAVENOUS
  Filled 2017-08-30: qty 32

## 2017-09-02 ENCOUNTER — Telehealth: Payer: Self-pay | Admitting: Interventional Cardiology

## 2017-09-02 MED ORDER — VERAPAMIL HCL ER 240 MG PO TBCR: 240.0000 mg | EXTENDED_RELEASE_TABLET | Freq: Every day | ORAL | 3 refills | Status: DC

## 2017-09-02 NOTE — Telephone Encounter (Signed)
Spoke with pt and went over Myoview results and recommendations per Dr. Tamala Julian.  Pt verbalized understanding and was in agreement with this plan.

## 2017-09-02 NOTE — Telephone Encounter (Signed)
New Message:    Pt is returning call from the other day

## 2017-09-29 ENCOUNTER — Ambulatory Visit (INDEPENDENT_AMBULATORY_CARE_PROVIDER_SITE_OTHER): Payer: Managed Care, Other (non HMO) | Admitting: Neurology

## 2017-09-29 ENCOUNTER — Encounter: Payer: Self-pay | Admitting: Neurology

## 2017-09-29 VITALS — BP 128/79 | HR 77 | Ht 65.0 in | Wt 161.0 lb

## 2017-09-29 DIAGNOSIS — G459 Transient cerebral ischemic attack, unspecified: Secondary | ICD-10-CM

## 2017-09-29 DIAGNOSIS — R413 Other amnesia: Secondary | ICD-10-CM

## 2017-09-29 NOTE — Progress Notes (Signed)
Reason for visit: Memory disturbance  [name] [name] is an 62 y.o. female  History of present illness:  [name] [name] is a 62 year old right-handed white female with a history of a mild memory disturbance that has been present for 4 or 5 years.  The patient has not noted any significant progression over the last 6 to 12 months.  The patient reports a mild memory disturbance, she has not had any falls.  She did have a couple recent episodes of sudden alteration in balance, feeling dizzy and staggering for about a minute with full resolution.  She did not blackout.  She has recently had an increase in her verapamil dosing, she was placed on aspirin.  CT scan of the chest showed no recurrence of cancer in the chest but she had evidence of coronary artery disease.  The patient is not on any medications for memory.  She recently had a MRI of the brain that was relatively unremarkable.  She returns to this office for an evaluation.  Past Medical History:  Diagnosis Date  . Anxiety    husband also cancer pt  . Cancer ([place])    lung  . Complication of anesthesia    bronchospasms post-op (pulmonary did consult but pt was discharged later that day)  With last surg. she was given albuterol inhaler & had steroid with surg.    . Congenital renal atrophy    left kidney, one spontaneous stone passed   . COPD (chronic obstructive pulmonary disease) ([place])   . Degenerative arthritis    spine, hands & knees   . GERD (gastroesophageal reflux disease)   . History of migraine   . Hypertension    h/o PVC- asymptomatic   . Memory disorder 03/29/2017  . 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 PONV (postoperative nausea and vomiting)   . PVC (premature ventricular contraction)   . Shortness of breath dyspnea    with exertion  . Thyroid nodule    benign by biopsy  . Varicose veins    left leg  . Visual disturbance    Episodic    . Vitamin D deficiency   . Wears glasses     Past Surgical History:  Procedure Laterality Date  . CARPAL TUNNEL RELEASE Left   . CESAREAN SECTION    . COLONOSCOPY W/ BIOPSIES AND POLYPECTOMY    . DILATION AND CURETTAGE OF UTERUS    . TONSILLECTOMY    . VARICOSE VEIN SURGERY     left  . VIDEO ASSISTED THORACOSCOPY (VATS)/ LOBECTOMY Right 07/23/2014   Procedure: VIDEO ASSISTED THORACOSCOPY (VATS)/ LOBECTOMY;  Surgeon: Ivin Poot, MD;  Location: [facility];  Service: Thoracic;  Laterality: Right;  [name] Kitchen VIDEO BRONCHOSCOPY N/A 07/23/2014   Procedure: VIDEO BRONCHOSCOPY;  Surgeon: Ivin Poot, MD;  Location: [facility];  Service: Thoracic;  Laterality: N/A;  . VIDEO BRONCHOSCOPY WITH ENDOBRONCHIAL ULTRASOUND Right 04/17/2014   Procedure: VIDEO BRONCHOSCOPY WITH ENDOBRONCHIAL ULTRASOUND;  Surgeon: Collene Gobble, MD;  Location: [facility];  Service: Thoracic;  Laterality: Right;  [name] Kitchen VIDEO BRONCHOSCOPY WITH ENDOBRONCHIAL ULTRASOUND N/A 07/08/2014   Procedure: VIDEO BRONCHOSCOPY WITH ENDOBRONCHIAL ULTRASOUND;  Surgeon: Ivin Poot, MD;  Location: [facility] OR;  Service: Thoracic;  Laterality: N/A;    Family History  Problem Relation Age of Onset  . Cancer Mother        cancer of the small intestines  . Hypertension  Father   . Atrial fibrillation Father   . Diverticulitis Father   . Hypertension Sister   . Diverticulitis Brother   . Hypertension Sister   . Headache Sister     Social history:  reports that she quit smoking about 4 years ago. Her smoking use included cigarettes. She has a 20.00 pack-year smoking history. She has never used smokeless tobacco. She reports that she drinks alcohol. She reports that she does not use drugs.    Allergies  Allergen Reactions  . Advil [Ibuprofen] Other (See Comments)    Causes hematuria   . Hydrocodone-Guaifenesin Itching  . Sulfa Antibiotics Hives and Swelling  . Temazepam Other (See Comments)    dizziness  . Plasticized Base [Plastibase] Rash    tape   . Zofran [Ondansetron Hcl] Palpitations    Medications:  Prior to Admission medications   Medication Sig Start Date End Date Taking? Authorizing Provider  albuterol (PROAIR HFA) 108 (90 Base) MCG/ACT inhaler Inhale 1 puff into the lungs 3 (three) times daily as needed for wheezing or shortness of breath.   Yes [provider]  aspirin EC 81 MG tablet Take 1 tablet (81 mg total) by mouth daily. 08/16/17  Yes Belva Crome, MD  rosuvastatin (CRESTOR) 10 MG tablet Take 1 tablet (10 mg total) by mouth daily. 08/16/17 08/11/18 Yes Belva Crome, MD  verapamil (CALAN-SR) 240 MG CR tablet Take 1 tablet (240 mg total) by mouth at bedtime. 09/02/17  Yes Belva Crome, MD    ROS:  Out of a complete 14 system review of symptoms, the patient complains only of the following symptoms, and all other reviewed systems are negative.  Partial loss of vision Shortness of breath Insomnia Frequent waking Back pain, neck pain Memory loss, dizziness  Blood pressure 128/79, pulse 77, height 5\' 5"  (1.651 m), weight 161 lb (73 kg).  Physical Exam  General: The patient is alert and cooperative at the time of the examination.  Skin: No significant peripheral edema is noted.   Neurologic Exam  Mental status: The patient is alert and oriented x 3 at the time of the examination. The patient has apparent normal recent and remote memory, with an apparently normal attention span and concentration ability.  Mini-Mental status examination done today shows a total score of 29/30.   Cranial nerves: Facial symmetry is present. Speech is normal, no aphasia or dysarthria is noted. Extraocular movements are full. Visual fields are full.  Motor: The patient has good strength in all 4 extremities.  Sensory examination: Soft touch sensation is symmetric on the face, arms, and legs.  Coordination: The patient has good finger-nose-finger and heel-to-shin bilaterally.  Gait and station: The patient has a normal  gait. Tandem gait is slightly unsteady. Romberg is negative, but is slightly unsteady. No drift is seen.  Reflexes: Deep tendon reflexes are symmetric.    MRI brain 04/21/17:  IMPRESSION:   Unremarkable MRI brain (with and without). No acute findings. No abnormal enhancing lesions.  * MRI scan images were reviewed online. I agree with the written report.     Assessment/Plan:  1.  Transient episode of dizziness, gait instability  2.  Mild memory disturbance  The patient will be followed for the mild memory disturbance.  She has mild gait instability, she will get into yoga to help improve balance, [facility], she claims that she is walking on a regular basis.  She does report some nights where she does  not sleep well, this may have some impact on memory and concentration.  Given the transient episode of gait instability and dizziness, carotid Doppler study will be done.  The patient will follow-up in 8 months.  Jill Alexanders MD 09/29/2017 2:52 PM  [facility] [address] [place],  [address]  Phone [phone] Fax [phone]

## 2017-10-11 ENCOUNTER — Other Ambulatory Visit: Payer: Managed Care, Other (non HMO)

## 2017-10-11 DIAGNOSIS — I2510 Atherosclerotic heart disease of native coronary artery without angina pectoris: Secondary | ICD-10-CM

## 2017-10-11 LAB — HEPATIC FUNCTION PANEL
ALK PHOS: 117 IU/L (ref 39–117)
ALT: 21 IU/L (ref 0–32)
AST: 20 IU/L (ref 0–40)
Albumin: 4.3 g/dL (ref 3.6–4.8)
BILIRUBIN, DIRECT: 0.17 mg/dL (ref 0.00–0.40)
Bilirubin Total: 0.5 mg/dL (ref 0.0–1.2)
Total Protein: 6.6 g/dL (ref 6.0–8.5)

## 2017-10-11 LAB — LIPID PANEL
Chol/HDL Ratio: 1.9 ratio (ref 0.0–4.4)
Cholesterol, Total: 139 mg/dL (ref 100–199)
HDL: 73 mg/dL (ref >39)
LDL Calculated: 55 mg/dL (ref 0–99)
Triglycerides: 54 mg/dL (ref 0–149)
VLDL Cholesterol Cal: 11 mg/dL (ref 5–40)

## 2017-10-13 ENCOUNTER — Encounter: Payer: Self-pay | Admitting: *Deleted

## 2017-10-13 ENCOUNTER — Ambulatory Visit: Payer: Managed Care, Other (non HMO) | Admitting: Interventional Cardiology

## 2017-10-19 ENCOUNTER — Ambulatory Visit: Payer: Managed Care, Other (non HMO) | Admitting: Interventional Cardiology

## 2017-10-28 ENCOUNTER — Ambulatory Visit (HOSPITAL_COMMUNITY)
Admission: RE | Admit: 2017-10-28 | Discharge: 2017-10-28 | Disposition: A | Discharge status: Home / Self Care | Payer: Managed Care, Other (non HMO) | Source: Ambulatory Visit | Attending: Neurology | Admitting: Neurology

## 2017-10-28 ENCOUNTER — Telehealth: Payer: Self-pay | Admitting: Neurology

## 2017-10-28 DIAGNOSIS — G459 Transient cerebral ischemic attack, unspecified: Secondary | ICD-10-CM

## 2017-10-28 DIAGNOSIS — I10 Essential (primary) hypertension: Secondary | ICD-10-CM | POA: Diagnosis not present

## 2017-10-28 DIAGNOSIS — I2510 Atherosclerotic heart disease of native coronary artery without angina pectoris: Secondary | ICD-10-CM | POA: Insufficient documentation

## 2017-10-28 NOTE — Telephone Encounter (Signed)
I called patient.  The carotid Doppler study was unremarkable.   Carotid doppler 5/34/19:  Final Interpretation: Right Carotid: The extracranial vessels were near-normal with only minimal wall        thickening or plaque.  Left Carotid: The extracranial vessels were near-normal with only minimal wall       thickening or plaque.  Vertebrals: Bilateral vertebral arteries demonstrate antegrade flow. Subclavians: Normal flow hemodynamics were seen in bilateral subclavian       arteries.

## 2017-10-28 NOTE — Progress Notes (Signed)
VASCULAR LAB PRELIMINARY  PRELIMINARY  PRELIMINARY  PRELIMINARY  Carotid duplex completed.    Preliminary report:  1-39% ICA plaquing.  Vertebral artery flow is antegrade.   Mitchell Epling, RVT 10/28/2017, 11:32 AM

## 2017-11-08 NOTE — Telephone Encounter (Signed)
Pt requesting a call to go over the results, stating she has questions regarding the percentage of plaque. Please call to advise

## 2017-11-08 NOTE — Telephone Encounter (Signed)
I called the patient again, left a message, the carotid Doppler study is essentially normal, there is no evidence of significant plaque.

## 2017-11-28 ENCOUNTER — Ambulatory Visit (INDEPENDENT_AMBULATORY_CARE_PROVIDER_SITE_OTHER): Payer: Managed Care, Other (non HMO) | Admitting: Emergency Medicine

## 2017-11-28 ENCOUNTER — Encounter: Payer: Self-pay | Admitting: Emergency Medicine

## 2017-11-28 DIAGNOSIS — C3431 Malignant neoplasm of lower lobe, right bronchus or lung: Secondary | ICD-10-CM | POA: Diagnosis not present

## 2017-11-28 DIAGNOSIS — J449 Chronic obstructive pulmonary disease, unspecified: Secondary | ICD-10-CM

## 2017-11-28 NOTE — Patient Instructions (Addendum)
We discussed possibly starting a daily inhaled medication today. It may be beneficial to start one at some point in the future.  Try to work on increasing your exercise and weight loss as planned Try using your albuterol strategically to see if it helps your breathing. This may help [place] decide whether to start a scheduled inhaled medicine at some point in the future Get your flu shot in the fall Follow with Dr. Julien Nordmann as planned and get your CT scans as planned. Follow with Dr Lamonte Sakai in 6 months or sooner if you have any problems

## 2017-11-28 NOTE — Progress Notes (Signed)
Subjective:    Patient ID: [name] [name], female    DOB: [birth year], 62 y.o.   MRN: [id number]  COPD  She complains of cough and shortness of breath. There is no wheezing. Pertinent negatives include no ear pain, fever, headaches, postnasal drip, rhinorrhea, sneezing, sore throat or trouble swallowing. Her past medical history is significant for COPD.   61 yo woman, former smoker (20 pk-yrs), hx of HTN, migraines. Hx COPD, adenocarcinoma of the lung status post right lower lobectomy.  ROV 07/28/15 -- patient has a history of COPD and adenocarcinoma of the lung. She has undergone VATS right lower lobectomy following neoadjuvant chemotherapy. She is currently on observation. Her last CT scan of the chest was on/20/17 which I personally reviewed and which shows no evidence of recurrent disease. She has been doing well, has not been exercising very much and feels out of shape, has put on about 20 lbs. She not having wheeze, minimal cough.   ROV 08/13/16 -- follow up visit for COPD and adenocarcinoma of the lung. Most recent CT scan of the chest was done on 07/16/16 I have personally reviewed. This shows no evidence of recurrence, stable emphysematous changes. She had an acute exacerbation last Spring, treated with pred and abx. We have discussed scheduled BD's but she has never been on one. She is having dyspnea with walking on an incline. Minimal cough or sputum. She has only used albuterol twice ever  ROV 02/15/17 -- Patient has a history of COPD, lower lobectomy for adenocarcinoma. Most recent CT scan of the chest 01/19/17 was reviewed by me. This shows no evidence for recurrent malignancy. At her last visit in March we discussed Spiriva. She never started it. Unfortunately her husband died since last time. She feels that her breathing is doing well - is able to exert.   ROV 05/10/17 --patient has a history of COPD and adenocarcinoma status post lobectomy.  Most recent CT scan 01/19/17 as above. She had  been well until just over a week ago, developed nasal congestion and then cough. She was seen in Urgent Care and was started on azithromycin. Her ProAir use has gone up. No CP. Fevers have resolved.   ROV 11/28/17 --62 year old woman with history of former tobacco, COPD.  She also has a history of adenocarcinoma of the lung status post neoadjuvant chemotherapy and right lower lobectomy.  I last saw her November 2018 at which time she was experiencing a mild exacerbation which we treated with prednisone and antibiotics.  She returns today reporting that she is feeling well. She has a bit of fatigue, notes that her Vit D level has been low.  She is currently on albuterol which she never uses. She is having some increased exertional SOB. Has gained some wt since last visit.   CT chest 07/2017 reviewed by me, no evidence for recurrence. She has also seen cardiology, underwent stress testing and is on medical management.    Review of Systems  Constitutional: Negative for fever and unexpected weight change.  HENT: Negative for congestion, dental problem, ear pain, nosebleeds, postnasal drip, rhinorrhea, sinus pressure, sneezing, sore throat and trouble swallowing.   Eyes: Negative for redness and itching.  Respiratory: Positive for cough and shortness of breath. Negative for chest tightness and wheezing.   Cardiovascular: Negative for palpitations and leg swelling.  Gastrointestinal: Negative for nausea and vomiting.  Genitourinary: Negative for dysuria.  Musculoskeletal: Negative for joint swelling.  Skin: Negative for rash.  Neurological: Negative for headaches.  Hematological: Does not bruise/bleed easily.  Psychiatric/Behavioral: Negative for dysphoric mood. The patient is not nervous/anxious.        Objective:   Physical Exam Vitals:   11/28/17 0952  BP: 126/82  Pulse: 86  SpO2: 94%  Weight: 165 lb (74.8 kg)  Height: 5' 5.5" (1.664 m)   Gen: Pleasant, well-nourished, in no distress,   normal affect  ENT: No lesions,  mouth clear,  oropharynx clear, no postnasal drip, no stridor  Lungs: No use of accessory muscles, no crackles, no active wheezing  Cardiovascular: RRR, heart sounds normal, no murmur or gallops, no peripheral edema  Musculoskeletal: No deformities, no cyanosis or clubbing  Neuro: alert, non focal  Skin: Warm, no lesions or rashes       Assessment & Plan:  COPD (chronic obstructive pulmonary disease) Moderately severe obstruction on her prior pulmonary function testing but her functional capacity is always been good.  She is more short winded with exertion now but unclear whether obstruction is a large component of this.  She notes that she is not been exercising, has gained some weight.  She wants to try to exercise more before committing to an every day inhaled medicine.  I asked her to try using her albuterol judiciously to see if it helps her breathing as this may help [place] determine whether schedule bronchodilator would be beneficial.  We will probably undertake a trial at some point in the future.  We discussed possibly starting a daily inhaled medication today. It may be beneficial to start one at some point in the future.  Try to work on increasing your exercise and weight loss as planned Try using your albuterol strategically to see if it helps your breathing. This may help [place] decide whether to start a scheduled inhaled medicine at some point in the future Get your flu shot in the fall Follow with Dr Lamonte Sakai in 6 months or sooner if you have any problems  Primary cancer of right lower lobe of lung Follow with Dr. Julien Nordmann as planned and get your CT scans as planned.  Baltazar Apo, MD, PhD 11/28/2017, 10:24 AM [facility] [phone] or if no answer [phone]

## 2017-11-28 NOTE — Assessment & Plan Note (Signed)
Moderately severe obstruction on her prior pulmonary function testing but her functional capacity is always been good.  She is more short winded with exertion now but unclear whether obstruction is a large component of this.  She notes that she is not been exercising, has gained some weight.  She wants to try to exercise more before committing to an every day inhaled medicine.  I asked her to try using her albuterol judiciously to see if it helps her breathing as this may help [place] determine whether schedule bronchodilator would be beneficial.  We will probably undertake a trial at some point in the future.  We discussed possibly starting a daily inhaled medication today. It may be beneficial to start one at some point in the future.  Try to work on increasing your exercise and weight loss as planned Try using your albuterol strategically to see if it helps your breathing. This may help [place] decide whether to start a scheduled inhaled medicine at some point in the future Get your flu shot in the fall Follow with Dr Lamonte Sakai in 6 months or sooner if you have any problems

## 2017-11-28 NOTE — Assessment & Plan Note (Signed)
Follow with Dr. Julien Nordmann as planned and get your CT scans as planned.

## 2018-01-18 ENCOUNTER — Telehealth: Payer: Self-pay | Admitting: Emergency Medicine

## 2018-01-18 MED ORDER — ALBUTEROL SULFATE HFA 108 (90 BASE) MCG/ACT IN AERS: 1.0000 | INHALATION_SPRAY | Freq: Three times a day (TID) | RESPIRATORY_TRACT | 2 refills | Status: DC | PRN

## 2018-01-18 NOTE — Telephone Encounter (Signed)
Pt is calling back [phone]

## 2018-01-18 NOTE — Telephone Encounter (Signed)
Spoke with pt. She is aware that her prescription has been sent in. Nothing further was needed. 

## 2018-01-18 NOTE — Telephone Encounter (Signed)
Rx for [name] [name] has been sent to preferred pharmacy.  lmtcb x1 to make pt aware.

## 2018-01-24 ENCOUNTER — Inpatient Hospital Stay: Payer: Managed Care, Other (non HMO) | Attending: Internal Medicine

## 2018-01-24 ENCOUNTER — Ambulatory Visit
Admission: RE | Admit: 2018-01-24 | Discharge: 2018-01-24 | Disposition: A | Discharge status: Home / Self Care | Payer: Managed Care, Other (non HMO) | Source: Ambulatory Visit | Attending: Internal Medicine | Admitting: Internal Medicine

## 2018-01-24 DIAGNOSIS — C3490 Malignant neoplasm of unspecified part of unspecified bronchus or lung: Secondary | ICD-10-CM

## 2018-01-24 DIAGNOSIS — Z85118 Personal history of other malignant neoplasm of bronchus and lung: Secondary | ICD-10-CM | POA: Diagnosis present

## 2018-01-24 LAB — CBC WITH DIFFERENTIAL (CANCER CENTER ONLY)
Basophils Absolute: 0.1 10*3/uL (ref 0.0–0.1)
Basophils Relative: 1 %
EOS PCT: 3 %
Eosinophils Absolute: 0.1 10*3/uL (ref 0.0–0.5)
HEMATOCRIT: 43.5 % (ref 34.8–46.6)
Hemoglobin: 14.3 g/dL (ref 11.6–15.9)
LYMPHS PCT: 17 %
Lymphs Abs: 1 10*3/uL (ref 0.9–3.3)
MCH: 30.1 pg (ref 25.1–34.0)
MCHC: 32.8 g/dL (ref 31.5–36.0)
MCV: 91.6 fL (ref 79.5–101.0)
Monocytes Absolute: 0.3 10*3/uL (ref 0.1–0.9)
Monocytes Relative: 6 %
NEUTROS ABS: 4.3 10*3/uL (ref 1.5–6.5)
Neutrophils Relative %: 73 %
Platelet Count: 243 10*3/uL (ref 145–400)
RBC: 4.75 MIL/uL (ref 3.70–5.45)
RDW: 13.1 % (ref 11.2–14.5)
WBC: 5.8 10*3/uL (ref 3.9–10.3)

## 2018-01-24 LAB — CMP (CANCER CENTER ONLY)
ALBUMIN: 4 g/dL (ref 3.5–5.0)
ALT: 20 U/L (ref 0–44)
ANION GAP: 8 (ref 5–15)
AST: 18 U/L (ref 15–41)
Alkaline Phosphatase: 129 U/L — ABNORMAL HIGH (ref 38–126)
BUN: 16 mg/dL (ref 8–23)
CO2: 27 mmol/L (ref 22–32)
Calcium: 9.3 mg/dL (ref 8.9–10.3)
Chloride: 108 mmol/L (ref 98–111)
Creatinine: 0.82 mg/dL (ref 0.44–1.00)
GFR, Est AFR Am: >60 mL/min (ref >60)
Glucose, Bld: 103 mg/dL — ABNORMAL HIGH (ref 70–99)
POTASSIUM: 4.8 mmol/L (ref 3.5–5.1)
Sodium: 143 mmol/L (ref 135–145)
Total Bilirubin: 0.4 mg/dL (ref 0.3–1.2)
Total Protein: 7.3 g/dL (ref 6.5–8.1)

## 2018-01-24 MED ORDER — IOPAMIDOL (ISOVUE-300) INJECTION 61%
75.0000 mL | Freq: Once | INTRAVENOUS | Status: AC | PRN
  Administered 2018-01-24: 75 mL via INTRAVENOUS

## 2018-01-25 ENCOUNTER — Other Ambulatory Visit: Payer: Managed Care, Other (non HMO)

## 2018-01-27 ENCOUNTER — Other Ambulatory Visit: Payer: Managed Care, Other (non HMO)

## 2018-01-31 ENCOUNTER — Telehealth: Payer: Self-pay

## 2018-01-31 ENCOUNTER — Inpatient Hospital Stay (HOSPITAL_BASED_OUTPATIENT_CLINIC_OR_DEPARTMENT_OTHER): Payer: Managed Care, Other (non HMO) | Admitting: Internal Medicine

## 2018-01-31 ENCOUNTER — Encounter: Payer: Self-pay | Admitting: Internal Medicine

## 2018-01-31 ENCOUNTER — Other Ambulatory Visit: Payer: Self-pay | Admitting: Internal Medicine

## 2018-01-31 VITALS — BP 137/75 | HR 80 | Temp 98.1°F | Resp 18 | Ht 65.5 in | Wt 167.9 lb

## 2018-01-31 DIAGNOSIS — C3490 Malignant neoplasm of unspecified part of unspecified bronchus or lung: Secondary | ICD-10-CM

## 2018-01-31 DIAGNOSIS — Z85118 Personal history of other malignant neoplasm of bronchus and lung: Secondary | ICD-10-CM

## 2018-01-31 DIAGNOSIS — C3431 Malignant neoplasm of lower lobe, right bronchus or lung: Secondary | ICD-10-CM

## 2018-01-31 NOTE — Telephone Encounter (Signed)
Printed avs and calender of upcoming appointment. Per 8/27 los. Due to insurance patient was schedule with for CT at the [facility].

## 2018-01-31 NOTE — Progress Notes (Signed)
[place] [phone]   [phone]  OFFICE PROGRESS NOTE  [name], [place] [address]  DIAGNOSIS: Primary cancer of right lower lobe of lung  Staging form: Lung, AJCC 7th Edition  Clinical: Stage IIIA (T2a, N2, M0) - Unsigned  Staging comments: Adenocarcinoma  Foundation 1 molecular studies: positive for [id number], KRASG13C, [id number]*, [id number]*11, GATA6 amplification and [id number] Negative for BRAF, ALK, MET, RET and ERBB2  PRIOR THERAPY:  1) Concurrent chemoradiation with chemotherapy in the form of weekly carboplatin for an AUC of 2 and paclitaxel 45 mg/m given concurrent with radiation 2) Right VATS (video-assisted thoracoscopic surgery) with right lower lobectomy and mediastinal lymph node dissection under the care of [name] 07/23/2014. The final pathology showed residual 1.8 cm moderate to poorly differentiated invasive adenocarcinoma with no pleural or lymphovascular invasion. The dissected lymph nodes were negative for malignancy and the final pathologic stage was (pT1a, pN0, pMx).  CURRENT THERAPY: Observation.   INTERVAL HISTORY: EJEWELENE MAIRENA89y.o. female returns to the clinic today for 6 months follow-up visit.  The patient is feeling fine today with no concerning complaints.  She denied having any chest pain, cough or hemoptysis but has shortness of breath with exertion.  She has no nausea, vomiting, diarrhea or constipation.  She denied having any recent weight loss or night sweats.  She has no headache or visual changes.  She had repeat CT scan of the chest performed recently and she is here for evaluation and discussion of her risk her results.  MEDICAL HISTORY: Past Medical History:  Diagnosis Date  . Anxiety    husband also cancer pt  . [place]  . Complication of anesthesia    bronchospasms post-op (pulmonary did consult but pt was discharged later  that day)  With last surg. she was given albuterol inhaler & had steroid with surg.    . Congenital renal atrophy    left kidney, one spontaneous stone passed   . COPD [place] obstructive [place] disease) ([place]   . Degenerative arthritis    spine, hands & knees   . GERD (gastroesophageal reflux disease)   . History of migraine   . Hypertension    h/o PVC- asymptomatic   . Memory disorder 03/29/2017  . 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 PONV (postoperative nausea and vomiting)   . PVC (premature ventricular contraction)   . Shortness of breath dyspnea    with exertion  . Thyroid nodule    benign by biopsy  . Varicose veins    left leg  . Visual disturbance    Episodic  . Vitamin D deficiency   . Wears glasses     ALLERGIES:  is allergic to advil [ibuprofen]; hydrocodone-guaifenesin; sulfa antibiotics; temazepam; plasticized base [plastibase]; and zofran [ondansetron hcl].  MEDICATIONS:  Current Outpatient Medications  Medication Sig Dispense Refill  . albuterol (PROAIR HFA) 108 (90 Base) MCG/ACT inhaler Inhale 1 puff into the lungs 3 (three) times daily as needed for wheezing or shortness of breath. 1 Inhaler 2  . aspirin EC 81 MG tablet Take 1 tablet (81 mg total) by mouth daily. 90 tablet 3  . rosuvastatin (CRESTOR) 10 MG tablet Take 1 tablet (10 mg total) by mouth daily. 90 tablet 3  . verapamil (CALAN-SR) 240 MG CR tablet Take 1 tablet (240 mg  total) by mouth at bedtime. 90 tablet 3  . Vitamin D, Ergocalciferol, (DRISDOL) 50000 units CAPS capsule Take 50,000 Units by mouth every 7 (seven) days.     Current Facility-Administered Medications  Medication Dose Route Frequency Provider Last Rate Last Dose  . Ampicillin-Sulbactam (UNASYN) 3 g in sodium chloride 0.9 % 100 mL IVPB  3 g Intravenous Once Avelina Laine, PA-C        SURGICAL HISTORY:  Past Surgical History:  Procedure Laterality  Date  . CARPAL TUNNEL RELEASE Left   . CESAREAN SECTION    . COLONOSCOPY W/ BIOPSIES AND POLYPECTOMY    . DILATION AND CURETTAGE OF UTERUS    . TONSILLECTOMY    . VARICOSE VEIN SURGERY     left  . VIDEO ASSISTED THORACOSCOPY (VATS)/ LOBECTOMY Right 07/23/2014   Procedure: VIDEO ASSISTED THORACOSCOPY (VATS)/ LOBECTOMY;  Surgeon: Ivin Poot, MD;  Location: [facility];  Service: Thoracic;  Laterality: Right;  [name] Kitchen VIDEO BRONCHOSCOPY N/A 07/23/2014   Procedure: VIDEO BRONCHOSCOPY;  Surgeon: Ivin Poot, MD;  Location: [facility];  Service: Thoracic;  Laterality: N/A;  . VIDEO BRONCHOSCOPY WITH ENDOBRONCHIAL ULTRASOUND Right 04/17/2014   Procedure: VIDEO BRONCHOSCOPY WITH ENDOBRONCHIAL ULTRASOUND;  Surgeon: Collene Gobble, MD;  Location: [facility];  Service: Thoracic;  Laterality: Right;  [name] Kitchen VIDEO BRONCHOSCOPY WITH ENDOBRONCHIAL ULTRASOUND N/A 07/08/2014   Procedure: VIDEO BRONCHOSCOPY WITH ENDOBRONCHIAL ULTRASOUND;  Surgeon: Ivin Poot, MD;  [facility];  Service: Thoracic;  Laterality: N/A;    REVIEW OF SYSTEMS:  A comprehensive review of systems was negative except for: Respiratory: positive for dyspnea on exertion   PHYSICAL EXAMINATION: General appearance: alert, cooperative and no distress Head: Normocephalic, without obvious abnormality, atraumatic Neck: no adenopathy, no JVD, supple, symmetrical, trachea midline and thyroid not enlarged, symmetric, no tenderness/mass/nodules Lymph nodes: Cervical, supraclavicular, and axillary nodes normal. Resp: clear to auscultation bilaterally Back: symmetric, no curvature. ROM normal. No CVA tenderness. Cardio: regular rate and rhythm, S1, S2 normal, no murmur, click, rub or gallop GI: soft, non-tender; bowel sounds normal; no masses,  no organomegaly Extremities: extremities normal, atraumatic, no cyanosis or edema  ECOG PERFORMANCE STATUS: 0 - Asymptomatic  Blood pressure 137/75, pulse 80, temperature 98.1 F (36.7 C), temperature source Oral,  resp. rate 18, height 5' 5.5" (1.664 m), weight 167 lb 14.4 oz (76.2 kg), SpO2 96 %.  LABORATORY DATA: Lab Results  Component Value Date   WBC 5.8 01/24/2018   HGB 14.3 01/24/2018   HCT 43.5 01/24/2018   MCV 91.6 01/24/2018   PLT 243 01/24/2018      Chemistry      Component Value Date/Time   NA 143 01/24/2018 0942   NA 142 01/19/2017 0958   K 4.8 01/24/2018 0942   K 4.3 01/19/2017 0958   CL 108 01/24/2018 0942   CO2 27 01/24/2018 0942   CO2 25 01/19/2017 0958   BUN 16 01/24/2018 0942   BUN 15.2 01/19/2017 0958   CREATININE 0.82 01/24/2018 0942   CREATININE 0.8 01/19/2017 0958      Component Value Date/Time   CALCIUM 9.3 01/24/2018 0942   CALCIUM 9.5 01/19/2017 0958   ALKPHOS 129 (H) 01/24/2018 0942   ALKPHOS 121 01/19/2017 0958   AST 18 01/24/2018 0942   AST 19 01/19/2017 0958   ALT 20 01/24/2018 0942   ALT 20 01/19/2017 0958   BILITOT 0.4 01/24/2018 0942   BILITOT 0.64 01/19/2017 0958       RADIOGRAPHIC STUDIES: Ct Chest W  Contrast  Result Date: 01/24/2018 CLINICAL DATA:  Six-month follow-up lung cancer. EXAM: CT CHEST WITH CONTRAST TECHNIQUE: Multidetector CT imaging of the chest was performed during intravenous contrast administration. CONTRAST:  50m ISOVUE-300 IOPAMIDOL (ISOVUE-300) INJECTION 61% COMPARISON:  07/25/2017 FINDINGS: Cardiovascular: The heart size is normal. No substantial pericardial effusion. Coronary artery calcification is evident. No thoracic aortic aneurysm. Mediastinum/Nodes: No mediastinal lymphadenopathy. There is no hilar lymphadenopathy. The esophagus has normal imaging features. There is no axillary lymphadenopathy. Lungs/Pleura: The central tracheobronchial airways are patent. Status post right lower lobectomy. Similar appearance chronic atelectasis or scarring in the left upper lobe and posterior left lower lobe. No pleural effusion. No suspicious nodule or mass. Upper Abdomen: 16 mm left hepatic cyst is unchanged. 7 mm low-density lesion  dome of the left liver is stable. No adrenal nodule or mass. Musculoskeletal: No worrisome lytic or sclerotic osseous abnormality. IMPRESSION: 1. Stable exam. No new or progressive interval findings to suggest recurrent or metastatic disease. 2. Status post right lower lobectomy. 3.  Emphysema. (ICD10-J43.9) 4.  Aortic Atherosclerois (ICD10-170.0) Electronically Signed   By: EMisty StanleyM.D.   On: 01/24/2018 15:59    ASSESSMENT AND PLAN:  This is a very pleasant 62years old white female with a stage IIIA non-small cell lung cancer status post neoadjuvant concurrent chemoradiation with weekly carboplatin and paclitaxel followed by right lower lobectomy and lymph node dissection with the final pathology consistent with a pathological stage pT1a, pN0. He is currently on observation and she has been doing very well. Repeat CT scan of the chest performed recently showed no concerning findings for disease recurrence. I discussed the scan results with the patient and recommended for her to continue on observation with repeat CT scan of the chest in 6 months. The patient was advised to call immediately if she has any concerning symptoms in the interval. The patient voices understanding of current disease status and treatment options and is in agreement with the current care plan. All questions were answered. The patient knows to call the clinic with any problems, questions or concerns. We can certainly see the patient much sooner if necessary. I spent 10 minutes counseling the patient face to face. The total time spent in the appointment was 15 minutes.  Disclaimer: This note was dictated with voice recognition software. Similar sounding words can inadvertently be transcribed and may not be corrected upon review.

## 2018-05-18 ENCOUNTER — Ambulatory Visit: Payer: Managed Care, Other (non HMO) | Admitting: Nurse Practitioner

## 2018-05-19 NOTE — Progress Notes (Signed)
[name] [name]  PATIENT: [name] [name] DOB: [birth year]   REASON FOR VISIT:follow up for memory loss [place] ILLNESS:UPDATE 12/16/2019CM [name] [name], 62 year old female returns for follow-up with history of memory loss which is mild.  It is been present for 4 to 5 years.  Her husband died last 10/01/2022.  She went to bereavement and now she is going to a grief share organization but has not started those classes.  She is under stress with the holidays.  Her memory score is stable.  Labs done at her last visit and within normal limits except for vitamin D which she is taking supplement.  Her vitamin B-12 was normal.  Carotid Doppler was normal.  She continues to drive without getting lost.  She has not had any accidents.  She has a history of lung cancer.  MRI of the brain has been unremarkable.  She is retired.  She is independent in activities of daily living.  She returns for reevaluation.  4/25/19KWMs. [name] [name] is a 62 year old right-handed white female with a history of a mild memory disturbance that has been present for 4 or 5 years.  The patient has not noted any significant progression over the last 6 to 12 months.  The patient reports a mild memory disturbance, she has not had any falls.  She did have a couple recent episodes of sudden alteration in balance, feeling dizzy and staggering for about a minute with full resolution.  She did not blackout.  She has recently had an increase in her verapamil dosing, she was placed on aspirin.  CT scan of the chest showed no recurrence of cancer in the chest but she had evidence of coronary artery disease.  The patient is not on any medications for memory.  She recently had a MRI of the brain that was relatively unremarkable.  She returns to this office for an evaluation.  REVIEW OF SYSTEMS: Full 14 system review of systems performed and notable only for those listed, all others are neg:    Constitutional: neg  Cardiovascular: neg Ear/Nose/Throat: neg  Skin: neg Eyes: neg Respiratory: neg Gastroitestinal: neg  Hematology/Lymphatic: neg  Endocrine: neg Musculoskeletal: Osteoarthritis Allergy/Immunology: neg Neurological: Mild cognitive impairment Psychiatric: neg Sleep : neg   ALLERGIES: Allergies  Allergen Reactions  . Advil [Ibuprofen] Other (See Comments)    Causes hematuria   . Hydrocodone-Guaifenesin Itching  . Sulfa Antibiotics Hives and Swelling  . Temazepam Other (See Comments)    dizziness  . Plasticized Base [Plastibase] Rash    tape  . Zofran [Ondansetron Hcl] Palpitations    HOME MEDICATIONS: Outpatient Medications Prior to Visit  Medication Sig Dispense Refill  . albuterol (PROAIR HFA) 108 (90 Base) MCG/ACT inhaler Inhale 1 puff into the lungs 3 (three) times daily as needed for wheezing or shortness of breath. 1 Inhaler 2  . aspirin EC 81 MG tablet Take 1 tablet (81 mg total) by mouth daily. 90 tablet 3  . rosuvastatin (CRESTOR) 10 MG tablet Take 1 tablet (10 mg total) by mouth daily. 90 tablet 3  . verapamil (CALAN-SR) 240 MG CR tablet Take 1 tablet (240 mg total) by mouth at bedtime. 90 tablet 3  . Vitamin D, Ergocalciferol, (DRISDOL) 50000 units CAPS capsule Take 50,000 Units by mouth every 7 (seven) days.     Facility-Administered Medications Prior to Visit  Medication Dose Route Frequency Provider Last Rate Last Dose  . Ampicillin-Sulbactam (UNASYN) 3 g in sodium chloride 0.9 %  100 mL IVPB  3 g Intravenous Once Avelina Laine, PA-C        PAST MEDICAL HISTORY: Past Medical History:  Diagnosis Date  . Anxiety    husband also cancer pt  . Cancer ([place])    lung  . Complication of anesthesia    bronchospasms post-op (pulmonary did consult but pt was discharged later that day)  With last surg. she was given albuterol inhaler & had steroid with surg.    . Congenital renal atrophy    left kidney, one spontaneous stone passed   . COPD  (chronic obstructive pulmonary disease) ([place])   . Degenerative arthritis    spine, hands & knees   . GERD (gastroesophageal reflux disease)   . History of migraine   . Hypertension    h/o PVC- asymptomatic   . Memory disorder 03/29/2017  . 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 PONV (postoperative nausea and vomiting)   . PVC (premature ventricular contraction)   . Shortness of breath dyspnea    with exertion  . Thyroid nodule    benign by biopsy  . Varicose veins    left leg  . Visual disturbance    Episodic  . Vitamin D deficiency   . Wears glasses     PAST SURGICAL HISTORY: Past Surgical History:  Procedure Laterality Date  . CARPAL TUNNEL RELEASE Left   . CESAREAN SECTION    . COLONOSCOPY W/ BIOPSIES AND POLYPECTOMY    . DILATION AND CURETTAGE OF UTERUS    . TONSILLECTOMY    . VARICOSE VEIN SURGERY     left  . VIDEO ASSISTED THORACOSCOPY (VATS)/ LOBECTOMY Right 07/23/2014   Procedure: VIDEO ASSISTED THORACOSCOPY (VATS)/ LOBECTOMY;  Surgeon: Ivin Poot, MD;  Location: [facility];  Service: Thoracic;  Laterality: Right;  [name] Kitchen VIDEO BRONCHOSCOPY N/A 07/23/2014   Procedure: VIDEO BRONCHOSCOPY;  Surgeon: Ivin Poot, MD;  Location: [facility];  Service: Thoracic;  Laterality: N/A;  . VIDEO BRONCHOSCOPY WITH ENDOBRONCHIAL ULTRASOUND Right 04/17/2014   Procedure: VIDEO BRONCHOSCOPY WITH ENDOBRONCHIAL ULTRASOUND;  Surgeon: Collene Gobble, MD;  Location: [facility];  Service: Thoracic;  Laterality: Right;  [name] Kitchen VIDEO BRONCHOSCOPY WITH ENDOBRONCHIAL ULTRASOUND N/A 07/08/2014   Procedure: VIDEO BRONCHOSCOPY WITH ENDOBRONCHIAL ULTRASOUND;  Surgeon: Ivin Poot, MD;  Location: [facility];  Service: Thoracic;  Laterality: N/A;    FAMILY HISTORY: Family History  Problem Relation Age of Onset  . Cancer Mother        cancer of the small intestines  . Hypertension Father   . Atrial fibrillation Father   . Diverticulitis  Father   . Hypertension Sister   . Diverticulitis Brother   . Hypertension Sister   . Headache Sister     SOCIAL HISTORY: Social History   Socioeconomic History  . Marital status: Widowed    Spouse name: Not on file  . Number of children: 1  . Years of education: 53  . Highest education level: Not on file  Occupational History  . Occupation: Nurse    Employer: [employer]  Social Needs  . Financial resource strain: Not on file  . Food insecurity:    Worry: Not on file    Inability: Not on file  . Transportation needs:    Medical: Not on file    Non-medical: Not on file  Tobacco Use  . Smoking status: Former Smoker    Packs/day: 0.50  Years: 40.00    Pack years: 20.00    Types: Cigarettes    Last attempt to quit: 03/17/2013    Years since quitting: 5.1  . Smokeless tobacco: Never Used  Substance and Sexual Activity  . Alcohol use: Yes    Alcohol/week: 0.0 standard drinks    Comment: " rare"  . Drug use: No  . Sexual activity: Not on file  Lifestyle  . Physical activity:    Days per week: Not on file    Minutes per session: Not on file  . Stress: Not on file  Relationships  . Social connections:    Talks on phone: Not on file    Gets together: Not on file    Attends religious service: Not on file    Active member of club or organization: Not on file    Attends meetings of clubs or organizations: Not on file    Relationship status: Not on file  . Intimate partner violence:    Fear of current or ex partner: Not on file    Emotionally abused: Not on file    Physically abused: Not on file    Forced sexual activity: Not on file  Other Topics Concern  . Not on file  Social History Narrative   Lives alone. Husband passed away 2016-09-25   Caffeine use: 1-2 cups coffee per day   Right handed      PHYSICAL EXAM  Vitals:   05/22/18 0954  BP: 125/72  Pulse: 80  Weight: 167 lb 6.4 oz (75.9 kg)  Height: 5' 5.5" (1.664 m)   Body mass index is 27.43  kg/m.  Generalized: Well developed, in no acute distress  Head: normocephalic and atraumatic,. Oropharynx benign  Neck: Supple, no carotid bruits  Cardiac: Regular rate rhythm, no murmur  Musculoskeletal: No deformity   Neurological examination   Mentation: Alert , clock drawing 4/4 MMSE - Mini Mental State Exam 05/22/2018 09/29/2017 03/29/2017  Not completed: (No Data) - -  Orientation to time 4 5 5   Orientation to Place 5 5 5   Registration 3 3 3   Attention/ Calculation 5 5 5   Recall 3 2 3   Language- name 2 objects 2 2 2   Language- repeat 1 1 1   Language- follow 3 step command 3 3 3   Language- read & follow direction 1 1 1   Write a sentence 1 1 1   Copy design 1 1 1   Total score 29 29 30    Follows all commands speech and language fluent.   Cranial nerve II-XII: Pupils were equal round reactive to light extraocular movements were full, visual field were full on confrontational test. Facial sensation and strength were normal. hearing was intact to finger rubbing bilaterally. Uvula tongue midline. head turning and shoulder shrug were normal and symmetric.Tongue protrusion into cheek strength was normal. Motor: normal bulk and tone, full strength in the BUE, BLE,  no pronator drift. No focal weakness Sensory: normal and symmetric to light touch, Coordination: finger-nose-finger, heel-to-shin bilaterally, no dysmetria Reflexes: Symmetric upper and lower plantar responses were flexor bilaterally. Gait and Station: Rising up from seated position without assistance, normal stance,  moderate stride, good arm swing, smooth turning, able to perform tiptoe, and heel walking without difficulty. Tandem gait is steady  DIAGNOSTIC DATA (LABS, IMAGING, TESTING) - I reviewed patient records, labs, notes, testing and imaging myself where available.  Lab Results  Component Value Date   WBC 5.8 01/24/2018   HGB 14.3 01/24/2018   HCT 43.5 01/24/2018  MCV 91.6 01/24/2018   PLT 243 01/24/2018       Component Value Date/Time   NA 143 01/24/2018 0942   NA 142 01/19/2017 0958   K 4.8 01/24/2018 0942   K 4.3 01/19/2017 0958   CL 108 01/24/2018 0942   CO2 27 01/24/2018 0942   CO2 25 01/19/2017 0958   GLUCOSE 103 (H) 01/24/2018 0942   GLUCOSE 101 01/19/2017 0958   BUN 16 01/24/2018 0942   BUN 15.2 01/19/2017 0958   CREATININE 0.82 01/24/2018 0942   CREATININE 0.8 01/19/2017 0958   CALCIUM 9.3 01/24/2018 0942   CALCIUM 9.5 01/19/2017 0958   PROT 7.3 01/24/2018 0942   PROT 6.6 10/11/2017 1023   PROT 6.9 01/19/2017 0958   ALBUMIN 4.0 01/24/2018 0942   ALBUMIN 4.3 10/11/2017 1023   ALBUMIN 3.7 01/19/2017 0958   AST 18 01/24/2018 0942   AST 19 01/19/2017 0958   ALT 20 01/24/2018 0942   ALT 20 01/19/2017 0958   ALKPHOS 129 (H) 01/24/2018 0942   ALKPHOS 121 01/19/2017 0958   BILITOT 0.4 01/24/2018 0942   BILITOT 0.64 01/19/2017 0958   GFRNONAA >60 01/24/2018 0942   GFRNONAA >89 08/08/2014 1439   GFRAA >60 01/24/2018 0942   GFRAA >89 08/08/2014 1439   Lab Results  Component Value Date   CHOL 139 10/11/2017   HDL 73 10/11/2017   LDLCALC 55 10/11/2017   TRIG 54 10/11/2017   CHOLHDL 1.9 10/11/2017    Lab Results  Component Value Date   VITAMINB12 464 03/29/2017    ASSESSMENT AND PLAN  62 y.o. year old female  has a past medical history of Anxiety, Cancer ([place]), COPD (chronic obstructive pulmonary disease) ([place]), Degenerative arthritis,  Hypertension, Memory disorder (03/29/2017), Migraine equivalent (11/09/2012), , PVC (premature ventricular contraction), Shortness of breath dyspnea, Thyroid nodule, Visual disturbance, Vitamin D deficiency, and Wears glasses. here to follow-up for  1Transient episode of dizziness, gait instability 2Mild memory disturbance   Memory score is stable29/30 Carotid doppler normal Continue exercise program, continue grief share meetings Vitamin B12 was normal, vitamin D was low and patient is now on supplement F/U in 6 months for  repeat MMSE Dennie Bible, [facility], [facility], [name] [address], [place], [place] [address] [phone]

## 2018-05-22 ENCOUNTER — Ambulatory Visit (INDEPENDENT_AMBULATORY_CARE_PROVIDER_SITE_OTHER): Payer: Managed Care, Other (non HMO) | Admitting: Nurse Practitioner

## 2018-05-22 ENCOUNTER — Encounter: Payer: Self-pay | Admitting: Nurse Practitioner

## 2018-05-22 VITALS — BP 125/72 | HR 80 | Ht 65.5 in | Wt 167.4 lb

## 2018-05-22 DIAGNOSIS — R413 Other amnesia: Secondary | ICD-10-CM

## 2018-05-22 NOTE — Progress Notes (Signed)
I have read the note, and I agree with the clinical assessment and plan.  Charles K Willis   

## 2018-05-22 NOTE — Patient Instructions (Addendum)
Memory score is stable Carotid doppler normal F/U in 6 months

## 2018-06-02 ENCOUNTER — Telehealth: Payer: Self-pay | Admitting: Emergency Medicine

## 2018-06-02 MED ORDER — AZITHROMYCIN 250 MG PO TABS: ORAL_TABLET | ORAL | 0 refills | Status: AC

## 2018-06-02 NOTE — Telephone Encounter (Signed)
Pt has been made aware of below recommendations and voiced her understanding.   Pt denied wheezing. Rx for zpak has been sent to preferred pharmacy.  I  emphasized the need to use albuterol every 6 hours as needed for increased shortness of breath or wheezing. Pt voiced her understanding and had no further questions. Nothing further is needed.

## 2018-06-02 NOTE — Telephone Encounter (Signed)
States is using Proair as needed. Would like something called in for her to get started on over the weekend.

## 2018-06-02 NOTE — Telephone Encounter (Signed)
Called and spoke to pt.  Pt reports of increased sob with exertion and at rest, prod cough with thick yellow to tan mucus, nasal drainage clear to pale yellow mucus x3d Denied fever, chills, sweats or additional sx.   Pt stated that she has not used proair as of yet. Pt has pending appt with 06/05/18 with BM, but is requesting recommendations prior to appt.   Aaron Edelman please advise. Thanks

## 2018-06-02 NOTE — Telephone Encounter (Signed)
Does the patient have any wheezing?If so okay to offer a short course of prednisone and antibioticAzithromycin 250mg  tablet  >>>Take 2 tablets (500mg  total) today, and then 1 tablet (250mg ) for the next four days  >>>take with food  >>>can also take probiotic and / or yogurt while on antibiotic   Prednisone 10mg  tablet  >>>Take 2 tablets (20 mg total) daily for the next 5 days >>> Take with food in the morning   If no wheezing then just place order for azithromycin.Patient to keep follow-up with our office on 06/05/2018.  Please emphasized the need to use albuterol every 6 hours as needed for increased shortness of breath or wheezing.Wyn Quaker, FNP

## 2018-06-04 NOTE — Progress Notes (Signed)
@Patient  ID: [name] [name], female    DOB: 1955-12-12, 62 y.o.   MRN: [id number]  Chief Complaint  Patient presents with  . Acute Visit    pt states sx have improved with zpak- called in on 06/02/18. pt using albuterol and delsym 1-2 times daily. c/o non prod cough, wheezing, increased sob with exertion and at rest.      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  20-pack-year smoking history. Maintenance: None Pt of: Dr. Lamonte Sakai  06/05/2018  - Visit   62 year old female former smoker followed in our office for COPD.  Patient is currently not maintained on any maintenance inhalers at this time.  Patient did contact our office on 06/02/2018 as she is having increased shortness of breath and was prescribed azithromycin patient reports her symptoms have improved since then.  Patient reports this is the first time since last December patient was treated with antibiotics.  Patient only presents to our office for trouble breathing or potential COPD exacerbations.  Patient reports that the fall/winter is typically always the worst time for her and she usually has at least one exacerbation at that time.  Patient reports that sputum color has improved to more of a white.  Sputum amount has decreased.  Patient reports she had wheezing yesterday but seems to have improved today.  She feels that the cough is improving with the azithromycin.  Patient did not receive prednisone on this treatment.  MMRC - Breathlessness Score 2 - on level ground, I walk slower than people of the same age because of breathlessness, or have to stop for breathe when walking to my own pace     Tests:   01/24/2018-CT chest with contrast- stable exam, no new or progressive interval findings to suggest recurrent or  metastatic disease, status post right lower lobectomy, emphysema  05/09/2014-pulmonary function test-FVC 2.73 (82% predicted), postbronchodilator ratio 65, FEV1 65, no bronchodilator response, DLCO 63  05/01/2014-echocardiogram-LV ejection fraction 50 to 69%, grade 1 diastolic dysfunction  FENO:  No results found for: NITRICOXIDE  PFT: PFT Results Latest Ref Rng & Units 05/09/2014  FVC-Pre L 2.73  FVC-Predicted Pre % 82  FVC-Post L 2.59  FVC-Predicted Post % 78  Pre FEV1/FVC % % 62  Post FEV1/FCV % % 65  FEV1-Pre L 1.68  FEV1-Predicted Pre % 65  FEV1-Post L 1.68  DLCO UNC% % 63  DLCO COR %Predicted % 68  TLC L 4.98  TLC % Predicted % 97  RV % Predicted % 105    Imaging: No results found.    Specialty Problems      Pulmonary Problems   COPD GOLD II B     01/24/2018-CT chest with contrast- stable exam, no new or progressive interval findings to suggest recurrent or metastatic disease, status post right lower lobectomy, emphysema  05/09/2014-pulmonary function test-FVC 2.73 (82% predicted), postbronchodilator ratio 65, FEV1 65, no bronchodilator response, DLCO 63       Cough   Primary cancer of right lower lobe of lung (HCC)      Allergies  Allergen Reactions  . Advil [Ibuprofen] Other (See Comments)    Causes hematuria   . Hydrocodone-Guaifenesin Itching  . Sulfa Antibiotics Hives and Swelling  . Temazepam Other (See Comments)    dizziness  . Plasticized Base [Plastibase] Rash  tape  . Zofran [Ondansetron Hcl] Palpitations    Immunization History  Administered Date(s) Administered  . Influenza Split 03/20/2018  . Influenza,inj,Quad PF,6+ Mos 03/10/2015, 02/15/2017  . Influenza-Unspecified 03/07/2014  . Pneumococcal-Unspecified 09/23/2013  . Tdap 09/28/2016, 04/06/2017  . Zoster Recombinat (Shingrix) 11/11/2017    Past Medical History:  Diagnosis Date  . Anxiety    husband also cancer pt  . Cancer ([facility])    lung  . Complication of anesthesia     bronchospasms post-op (pulmonary did consult but pt was discharged later that day)  With last surg. she was given albuterol inhaler & had steroid with surg.    . Congenital renal atrophy    left kidney, one spontaneous stone passed   . COPD (chronic obstructive pulmonary disease) ([place])   . Degenerative arthritis    spine, hands & knees   . GERD (gastroesophageal reflux disease)   . History of migraine   . Hypertension    h/o PVC- asymptomatic   . Memory disorder 03/29/2017  . 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 PONV (postoperative nausea and vomiting)   . PVC (premature ventricular contraction)   . Shortness of breath dyspnea    with exertion  . Thyroid nodule    benign by biopsy  . Varicose veins    left leg  . Visual disturbance    Episodic  . Vitamin D deficiency   . Wears glasses     Tobacco History: Social History   Tobacco Use  Smoking Status Former Smoker  . Packs/day: 0.50  . Years: 40.00  . Pack years: 20.00  . Types: Cigarettes  . Last attempt to quit: 03/17/2013  . Years since quitting: 5.2  Smokeless Tobacco Never Used   Counseling given: Yes  Continue to not smoke  Outpatient Encounter Medications as of 06/05/2018  Medication Sig  . albuterol (PROAIR HFA) 108 (90 Base) MCG/ACT inhaler Inhale 1 puff into the lungs 3 (three) times daily as needed for wheezing or shortness of breath.  [name] Kitchen aspirin EC 81 MG tablet Take 1 tablet (81 mg total) by mouth daily.  [name] Kitchen azithromycin (ZITHROMAX) 250 MG tablet Take 2 tablets (500 mg) on  Day 1,  followed by 1 tablet (250 mg) once daily on Days 2 through 5.  . rosuvastatin (CRESTOR) 10 MG tablet Take 1 tablet (10 mg total) by mouth daily.  . verapamil (CALAN-SR) 240 MG CR tablet Take 1 tablet (240 mg total) by mouth at bedtime.  . Vitamin D, Ergocalciferol, (DRISDOL) 50000 units CAPS capsule Take 50,000 Units by mouth every 7 (seven) days.    Facility-Administered Encounter Medications as of 06/05/2018  Medication  . Ampicillin-Sulbactam (UNASYN) 3 g in sodium chloride 0.9 % 100 mL IVPB     Review of Systems  Review of Systems  Constitutional: Negative for chills, fatigue, fever and unexpected weight change.  HENT: Positive for congestion. Negative for ear pain, postnasal drip, sinus pressure and sinus pain.   Respiratory: Positive for cough and shortness of breath. Negative for chest tightness and wheezing.   Cardiovascular: Negative for chest pain and palpitations.  Gastrointestinal: Negative for diarrhea, nausea and vomiting.  Musculoskeletal: Negative for arthralgias.  Skin: Negative for color change.  Allergic/Immunologic: Positive for environmental allergies (symptoms tend to worsen in the fall ). Negative for food allergies.  Neurological: Negative for dizziness, light-headedness and headaches.  Psychiatric/Behavioral: Negative for dysphoric mood. The patient is  not nervous/anxious.   All other systems reviewed and are negative.    Physical Exam  BP 126/74 (BP Location: Left Arm, Cuff Size: Normal)   Pulse 86   Temp 98 F (36.7 C) (Oral)   Ht 5' 5.5" (1.664 m)   Wt 163 lb (73.9 kg)   SpO2 95%   BMI 26.71 kg/m   Wt Readings from Last 5 Encounters:  06/05/18 163 lb (73.9 kg)  05/22/18 167 lb 6.4 oz (75.9 kg)  01/31/18 167 lb 14.4 oz (76.2 kg)  11/28/17 165 lb (74.8 kg)  09/29/17 161 lb (73 kg)     Physical Exam  Constitutional: She is oriented to person, place, and time and well-developed, well-nourished, and in no distress. No distress.  HENT:  Head: Normocephalic and atraumatic.  Right Ear: Hearing, tympanic membrane, external ear and ear canal normal.  Left Ear: Hearing, tympanic membrane, external ear and ear canal normal.  Nose: Mucosal edema present.  Mouth/Throat: Uvula is midline and oropharynx is clear and moist. No oropharyngeal exudate.  Eyes: Pupils are equal, round, and reactive to  light.  Neck: Normal range of motion. Neck supple. No JVD present.  Cardiovascular: Normal rate, regular rhythm and normal heart sounds.  Pulmonary/Chest: Effort normal. No accessory muscle usage. No respiratory distress. She has decreased breath sounds in the right lower field. She has no wheezes. She has no rhonchi.  Musculoskeletal: Normal range of motion.        General: No edema.  Lymphadenopathy:    She has no cervical adenopathy.  Neurological: She is alert and oriented to person, place, and time. Gait normal.  Skin: Skin is warm and dry. She is not diaphoretic. No erythema.  Psychiatric: Mood, memory, affect and judgment normal.  Nursing note and vitals reviewed.     Lab Results:  CBC    Component Value Date/Time   WBC 5.8 01/24/2018 0942   WBC 6.0 07/25/2017 1005   RBC 4.75 01/24/2018 0942   HGB 14.3 01/24/2018 0942   HGB 14.2 01/19/2017 0958   HCT 43.5 01/24/2018 0942   HCT 42.8 01/19/2017 0958   PLT 243 01/24/2018 0942   PLT 265 01/19/2017 0958   MCV 91.6 01/24/2018 0942   MCV 90.8 01/19/2017 0958   MCH 30.1 01/24/2018 0942   MCHC 32.8 01/24/2018 0942   RDW 13.1 01/24/2018 0942   RDW 13.4 01/19/2017 0958   LYMPHSABS 1.0 01/24/2018 0942   LYMPHSABS 1.1 01/19/2017 0958   MONOABS 0.3 01/24/2018 0942   MONOABS 0.4 01/19/2017 0958   EOSABS 0.1 01/24/2018 0942   EOSABS 0.1 01/19/2017 0958   BASOSABS 0.1 01/24/2018 0942   BASOSABS 0.1 01/19/2017 0958    BMET    Component Value Date/Time   NA 143 01/24/2018 0942   NA 142 01/19/2017 0958   K 4.8 01/24/2018 0942   K 4.3 01/19/2017 0958   CL 108 01/24/2018 0942   CO2 27 01/24/2018 0942   CO2 25 01/19/2017 0958   GLUCOSE 103 (H) 01/24/2018 0942   GLUCOSE 101 01/19/2017 0958   BUN 16 01/24/2018 0942   BUN 15.2 01/19/2017 0958   CREATININE 0.82 01/24/2018 0942   CREATININE 0.8 01/19/2017 0958   CALCIUM 9.3 01/24/2018 0942   CALCIUM 9.5 01/19/2017 0958   GFRNONAA >60 01/24/2018 0942   GFRNONAA >89  08/08/2014 1439   GFRAA >60 01/24/2018 0942   GFRAA >89 08/08/2014 1439    BNP No results found for: BNP  ProBNP No results found for: PROBNP  Assessment & Plan:   Pleasant 62 year old female patient complaining follow-up with our office today.  I believe it is okay for the patient to continue to be off of maintenance inhalers at this time as she is only had 1 exacerbation this year.  Patient to continue to keep an eye on her symptoms.  Could consider pulmonary function test in the future as last was in 2015 when patient was initially diagnosed with lung cancer.  Could also consider maintenance inhalers at that time if more exacerbations occur.  Will refer patient to pulmonary rehab as she is COPD Gold 2B by staging guidelines it is interested in doing more physical activity.  Follow-up in 6 months  COPD GOLD II B  Assessment: saba use 1x daily occasionally  Benign exam Resolving exacerbation   Plan:  Could consider PFTs in the future if more exacerbations occur  Could consider maintenance inhaler if more exacerbations occure, pt would like to hold off at this time   Will we refer you to pulmonary rehab  >>>[place]  Only use your albuterol as a rescue medication to be used if you can't catch your breath by resting or doing a relaxed purse lip breathing pattern.  - The less you use it, the better it will work when you need it. - Ok to use up to 2 puffs  every 6 hours if you must but call for immediate appointment if use goes up over your usual need - Don't leave home without it !!  (think of it like the spare tire for your car)    Note your daily symptoms > remember "red flags" for COPD:   >>>Increase in cough >>>increase in sputum production >>>increase in shortness of breath or activity  intolerance.   If you notice these symptoms, please call the office to be seen.   Follow up in 30months with Dr. Mila Merry, NP 06/05/2018   This appointment was  28 minutes along with over 50% of the time in direct face-to-face patient care, assessment, plan of care, and follow-up.

## 2018-06-05 ENCOUNTER — Ambulatory Visit (INDEPENDENT_AMBULATORY_CARE_PROVIDER_SITE_OTHER): Payer: Managed Care, Other (non HMO) | Admitting: Pulmonary Disease

## 2018-06-05 ENCOUNTER — Encounter: Payer: Self-pay | Admitting: Pulmonary Disease

## 2018-06-05 VITALS — BP 126/74 | HR 86 | Temp 98.0°F | Ht 65.5 in | Wt 163.0 lb

## 2018-06-05 DIAGNOSIS — J449 Chronic obstructive pulmonary disease, unspecified: Secondary | ICD-10-CM | POA: Diagnosis not present

## 2018-06-05 NOTE — Assessment & Plan Note (Signed)
Assessment: saba use 1x daily occasionally  Benign exam Resolving exacerbation   Plan:  Could consider PFTs in the future if more exacerbations occur  Could consider maintenance inhaler if more exacerbations occure, pt would like to hold off at this time   Will we refer you to pulmonary rehab  >>>[place]  Only use your albuterol as a rescue medication to be used if you can't catch your breath by resting or doing a relaxed purse lip breathing pattern.  - The less you use it, the better it will work when you need it. - Ok to use up to 2 puffs  every 6 hours if you must but call for immediate appointment if use goes up over your usual need - Don't leave home without it !!  (think of it like the spare tire for your car)    Note your daily symptoms > remember "red flags" for COPD:   >>>Increase in cough >>>increase in sputum production >>>increase in shortness of breath or activity  intolerance.   If you notice these symptoms, please call the office to be seen.   Follow up in 11months with Dr. Lamonte Sakai

## 2018-06-05 NOTE — Patient Instructions (Addendum)
Will we refer you to pulmonary rehab  >>>[place]  Only use your albuterol as a rescue medication to be used if you can't catch your breath by resting or doing a relaxed purse lip breathing pattern.  - The less you use it, the better it will work when you need it. - Ok to use up to 2 puffs  every 6 hours if you must but call for immediate appointment if use goes up over your usual need - Don't leave home without it !!  (think of it like the spare tire for your car)    Note your daily symptoms > remember "red flags" for COPD:   >>>Increase in cough >>>increase in sputum production >>>increase in shortness of breath or activity  intolerance.    If you notice these symptoms, please call the office to be seen.    Follow up in 108months with Dr. Lamonte Sakai   It is flu season:   >>>Remember to be washing your hands regularly, using hand sanitizer, be careful to use around herself with has contact with people who are sick will increase her chances of getting sick yourself. >>> Best ways to protect herself from the flu: Receive the yearly flu vaccine, practice good hand hygiene washing with soap and also using hand sanitizer when available, eat a nutritious meals, get adequate rest, hydrate appropriately   Please contact the office if your symptoms worsen or you have concerns that you are not improving.   Thank you for choosing [facility] for your healthcare, and for allowing [place] to partner with you on your healthcare journey. I am thankful to be able to provide care to you today.   Wyn Quaker FNP-C

## 2018-06-09 ENCOUNTER — Telehealth (HOSPITAL_COMMUNITY): Payer: Self-pay

## 2018-06-09 NOTE — Telephone Encounter (Signed)
Referral received from MD [place] on for Pulmonary Rehab with diagnosis of COPD GOLD 2B. Clinical review of pt follow up appt on 06/05/18 Pulmonary office note.   Pt appropriate for scheduling for Pulmonary rehab.  Will forward to support staff for scheduling and verification of insurance eligibility/benefits with pt  consent.  Joycelyn Man, RN, BSN Cardiac and Pulmonary Rehab Nurse

## 2018-06-22 ENCOUNTER — Telehealth (HOSPITAL_COMMUNITY): Payer: Self-pay

## 2018-06-22 NOTE — Telephone Encounter (Signed)
Pt insurance is active and benefits verified through [place]. Co-pay $0.00, DED $2,800.00/$0.00 met, out of pocket $6,850.00/$0.00 met, co-insurance 20%. No pre-authorization required. [employer], 06/22/2018  _0 :58AM, REF# [id number]

## 2018-06-22 NOTE — Telephone Encounter (Signed)
Attempted to call patient in regards to Pulmonary Rehab - LM on VM °

## 2018-06-23 ENCOUNTER — Telehealth (HOSPITAL_COMMUNITY): Payer: Self-pay

## 2018-06-23 NOTE — Telephone Encounter (Signed)
Pt returned PT phone call and stated she will like to participate in Pulmonary Rehab. Patient will come in for orientation on 07/10/2018 @ 930AM and will attend the 130PM exercise class. Went over insurance, patient verbalized understanding.  Mailed homework package.

## 2018-07-06 ENCOUNTER — Other Ambulatory Visit: Payer: Self-pay | Admitting: Interventional Cardiology

## 2018-07-10 ENCOUNTER — Encounter (HOSPITAL_COMMUNITY)
Admission: RE | Admit: 2018-07-10 | Discharge: 2018-07-10 | Disposition: A | Discharge status: Home / Self Care | Payer: Managed Care, Other (non HMO) | Source: Ambulatory Visit | Attending: Emergency Medicine | Admitting: Emergency Medicine

## 2018-07-10 ENCOUNTER — Encounter (HOSPITAL_COMMUNITY): Payer: Self-pay

## 2018-07-10 VITALS — BP 143/75 | HR 76 | Temp 97.6°F | Resp 18 | Ht 65.5 in | Wt 171.3 lb

## 2018-07-10 DIAGNOSIS — J449 Chronic obstructive pulmonary disease, unspecified: Secondary | ICD-10-CM

## 2018-07-10 HISTORY — DX: Atherosclerotic heart disease of native coronary artery without angina pectoris: I25.10

## 2018-07-10 NOTE — Progress Notes (Signed)
[name] [name] 63 y.o. female Pulmonary Rehab Orientation Note [place] was referred to pulmonary rehab by Dr. Lamonte Sakai with the diagnosis of COPD. Patient arrived today in Cardiac and Pulmonary Rehab for orientation to Pulmonary Rehab. She walked in from [employer]. She does not carry portable oxygen. Per pt, she uses oxygen never. Color good, skin warm and dry. Patient is oriented to time and place. Patient's medical history, psychosocial health, and medications reviewed. Psychosocial assessment reveals pt lives alone. Pt is currently retired. Pt hobbies include reading and gardening at a community garden. Pt reports her stress level is moderate. Areas of stress/anxiety include Family and finances. Pt lost her husband a year and a half ago and has been trying to find her new "normal" since.  Pt does not exhibit signs of depression. Pt states she has insomnia which hinders her sleep, but she states it is not related to depression. PHQ2/9 score 0/1. Pt shows good  coping skills with positive outlook. Will continue to monitor and evaluate progress toward psychosocial goal(s) of maintaining a positive and healthy outlook . Physical assessment reveals heart rate is normal, breath sounds clear to auscultation, no wheezes, rales, or rhonchi, diminished. Grip strength equal, strong. Distal pulses present. Patient reports she does take medications as prescribed. Patient states she follows a Regular diet and states that her and her daughter eat a lot of junk food, which she knows she needs to work on. The patient reports no specific efforts to gain or lose weight. Patient's weight will be monitored closely. Demonstration and practice of PLB. Patient able to return demonstration satisfactorily. Safety and hand hygiene in the exercise area reviewed with patient. Patient voices understanding of the information reviewed. Department expectations discussed with patient and achievable goals were set. The patient shows enthusiasm  about attending the program and we look forward to working with this nice lady. The patient is scheduled for a 6 min walk test on 07/11/18 at 3:00 PM and to begin exercise on 07/18/18 with the 1:30 PM class.  [id number]  Joycelyn Man RN, BSN Cardiac and Pulmonary Rehab RN

## 2018-07-11 ENCOUNTER — Encounter (HOSPITAL_COMMUNITY)
Admission: RE | Admit: 2018-07-11 | Discharge: 2018-07-11 | Disposition: A | Discharge status: Home / Self Care | Payer: Managed Care, Other (non HMO) | Source: Ambulatory Visit | Attending: Emergency Medicine | Admitting: Emergency Medicine

## 2018-07-11 DIAGNOSIS — J449 Chronic obstructive pulmonary disease, unspecified: Secondary | ICD-10-CM | POA: Diagnosis present

## 2018-07-13 NOTE — Progress Notes (Signed)
Pulmonary Individual Treatment Plan  Patient Details  Name: [name] [name] MRN: [id number] Date of Birth: 1956-03-24 Referring Provider:     Pulmonary Rehab Walk Test from 07/11/2018 in [facility]  Referring Provider  Dr. Lamonte Sakai      Initial Encounter Date:    Pulmonary Rehab Walk Test from 07/11/2018 in [facility]  Date  07/13/18      Visit Diagnosis: Chronic obstructive pulmonary disease, unspecified COPD type ([place])  Patient's Home Medications on Admission:   Current Outpatient Medications:  .  albuterol (PROAIR HFA) 108 (90 Base) MCG/ACT inhaler, Inhale 1 puff into the lungs 3 (three) times daily as needed for wheezing or shortness of breath., Disp: 1 Inhaler, Rfl: 2 .  aspirin EC 81 MG tablet, Take 1 tablet (81 mg total) by mouth daily., Disp: 90 tablet, Rfl: 3 .  rosuvastatin (CRESTOR) 10 MG tablet, Take 1 tablet (10 mg total) by mouth daily. Please make annual appt for future refills. [phone] you., Disp: 90 tablet, Rfl: 0 .  verapamil (CALAN-SR) 240 MG CR tablet, Take 1 tablet (240 mg total) by mouth at bedtime., Disp: 90 tablet, Rfl: 3 .  Vitamin D, Ergocalciferol, (DRISDOL) 50000 units CAPS capsule, Take 50,000 Units by mouth every 7 (seven) days., Disp: , Rfl:   Current Facility-Administered Medications:  .  Ampicillin-Sulbactam (UNASYN) 3 g in sodium chloride 0.9 % 100 mL IVPB, 3 g, Intravenous, Once, Avelina Laine, PA-C  Past Medical History: Past Medical History:  Diagnosis Date  . Anxiety    husband also cancer pt  . Cancer ([place])    lung  . Complication of anesthesia    bronchospasms post-op (pulmonary did consult but pt was discharged later that day)  With last surg. she was given albuterol inhaler & had steroid with surg.    . Congenital renal atrophy    left kidney, one spontaneous stone passed   . COPD (chronic obstructive pulmonary disease) ([place])   . Coronary artery disease   .  Degenerative arthritis    spine, hands & knees   . GERD (gastroesophageal reflux disease)   . History of migraine   . Hypertension    h/o PVC- asymptomatic   . Memory disorder 03/29/2017  . 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 PONV (postoperative nausea and vomiting)   . PVC (premature ventricular contraction)   . Shortness of breath dyspnea    with exertion  . Thyroid nodule    benign by biopsy  . Varicose veins    left leg  . Visual disturbance    Episodic  . Vitamin D deficiency   . Wears glasses     Tobacco Use: Social History   Tobacco Use  Smoking Status Former Smoker  . Packs/day: 0.50  . Years: 40.00  . Pack years: 20.00  . Types: Cigarettes  . Last attempt to quit: 03/17/2013  . Years since quitting: 5.3  Smokeless Tobacco Never Used    Labs: Recent Chemical engineer    Labs for ITP Cardiac and Pulmonary Rehab Latest Ref Rng & Units 07/19/2014 07/24/2014 10/11/2017   Cholestrol 100 - 199 mg/dL - - 139   LDLCALC 0 - 99 mg/dL - - 55   HDL >39 mg/dL - - 73   Trlycerides 0 - 149 mg/dL - - 54   PHART 7.350 - 7.450 7.458(H) 7.424 -  PCO2ART 35.0 - 45.0 mmHg 31.2(L) 37.7 -   HCO3 20.0 - 24.0 mEq/L 21.8 24.7(H) -   TCO2 0 - 100 mmol/L 22.7 26 -   ACIDBASEDEF 0.0 - 2.0 mmol/L 1.5 - -   O2SAT % 99.7 97.0 -      Capillary Blood Glucose: Lab Results  Component Value Date   GLUCAP 119 (H) 04/23/2014     Pulmonary Assessment Scores: Pulmonary Assessment Scores    Row Name 07/10/18 1111 07/11/18 1603       ADL UCSD   ADL Phase  Entry  -    SOB Score total  30.5  -      CAT Score   CAT Score  13  -      mMRC Score   mMRC Score  -  1       Pulmonary Function Assessment: Pulmonary Function Assessment - 07/10/18 1015      Breath   Bilateral Breath Sounds  Decreased   absent RLL d/t previous lobectomy   Shortness of Breath  Yes;Limiting activity       Exercise  Target Goals: Exercise Program Goal: Individual exercise prescription set using results from initial 6 min walk test and THRR while considering  patient's activity barriers and safety.   Exercise Prescription Goal: Initial exercise prescription builds to 30-45 minutes a day of aerobic activity, 2-3 days per week.  Home exercise guidelines will be given to patient during program as part of exercise prescription that the participant will acknowledge.  Activity Barriers & Risk Stratification: Activity Barriers & Cardiac Risk Stratification - 07/10/18 1006      Activity Barriers & Cardiac Risk Stratification   Activity Barriers  Arthritis;Back Problems;Neck/Spine Problems;Shortness of Breath;Muscular Weakness;Balance Concerns       6 Minute Walk: 6 Minute Walk    Row Name 07/11/18 1603         6 Minute Walk   Distance  1800 feet     Walk Time  6 minutes     # of Rest Breaks  0     MPH  3.41     METS  3.6     RPE  12     Perceived Dyspnea   2     VO2 Peak  15.28 relative     Symptoms  No     Resting HR  83 bpm     Resting BP  124/76     Resting Oxygen Saturation   97 %     Exercise Oxygen Saturation  during 6 min walk  92 %     Max Ex. HR  125 bpm     Max Ex. BP  140/80     2 Minute Post BP  124/78       Interval HR   1 Minute HR  101     2 Minute HR  113     3 Minute HR  120     4 Minute HR  125     5 Minute HR  121     6 Minute HR  120     2 Minute Post HR  84     Interval Heart Rate?  Yes       Interval Oxygen   Interval Oxygen?  Yes     Baseline Oxygen Saturation %  97 %     1 Minute Oxygen Saturation %  95 %     1 Minute Liters of Oxygen  0 L  2 Minute Oxygen Saturation %  93 %     2 Minute Liters of Oxygen  0 L     3 Minute Oxygen Saturation %  92 %     3 Minute Liters of Oxygen  0 L     4 Minute Oxygen Saturation %  92 %     4 Minute Liters of Oxygen  0 L     5 Minute Oxygen Saturation %  93 %     5 Minute Liters of Oxygen  0 L     6 Minute Oxygen  Saturation %  94 %     6 Minute Liters of Oxygen  0 L     2 Minute Post Oxygen Saturation %  97 %     2 Minute Post Liters of Oxygen  0 L        Oxygen Initial Assessment: Oxygen Initial Assessment - 07/11/18 1603      Home Oxygen   Home Oxygen Device  None    Sleep Oxygen Prescription  None    Home Exercise Oxygen Prescription  None    Home at Rest Exercise Oxygen Prescription  None      Initial 6 min Walk   Oxygen Used  None      Program Oxygen Prescription   Program Oxygen Prescription  None      Intervention   Short Term Goals  To learn and understand importance of monitoring SPO2 with pulse oximeter and demonstrate accurate use of the pulse oximeter.;To learn and demonstrate proper pursed lip breathing techniques or other breathing techniques.;To learn and understand importance of maintaining oxygen saturations>88%;To learn and demonstrate proper use of respiratory medications    Long  Term Goals  Maintenance of O2 saturations>88%;Verbalizes importance of monitoring SPO2 with pulse oximeter and return demonstration;Exhibits proper breathing techniques, such as pursed lip breathing or other method taught during program session;Compliance with respiratory medication;Demonstrates proper use of MDI's       Oxygen Re-Evaluation:   Oxygen Discharge (Final Oxygen Re-Evaluation):   Initial Exercise Prescription: Initial Exercise Prescription - 07/13/18 0700      Date of Initial Exercise RX and Referring Provider   Date  07/13/18    Referring Provider  Dr. Lamonte Sakai      NuStep   Level  2    SPM  80    Minutes  17    METs  1.5      Arm Ergometer   Level  1    Watts  10    Minutes  17      Track   Laps  8    Minutes  17      Prescription Details   Frequency (times per week)  2    Duration  Progress to 45 minutes of aerobic exercise without signs/symptoms of physical distress      Intensity   THRR 40-80% of Max Heartrate  62-125    Ratings of Perceived Exertion   11-13    Perceived Dyspnea  0-4      Progression   Progression  Continue to progress workloads to maintain intensity without signs/symptoms of physical distress.      Resistance Training   Training Prescription  Yes    Weight  orange bands    Reps  10-15       Perform Capillary Blood Glucose checks as needed.  Exercise Prescription Changes:   Exercise Comments:   Exercise Goals and Review: Exercise Goals  [place] Name 07/10/18 1008             Exercise Goals   Increase Physical Activity  Yes       Intervention  Provide advice, education, support and counseling about physical activity/exercise needs.;Develop an individualized exercise prescription for aerobic and resistive training based on initial evaluation findings, risk stratification, comorbidities and participant's personal goals.       Expected Outcomes  Short Term: Attend rehab on a regular basis to increase amount of physical activity.       Increase Strength and Stamina  Yes       Intervention  Provide advice, education, support and counseling about physical activity/exercise needs.;Develop an individualized exercise prescription for aerobic and resistive training based on initial evaluation findings, risk stratification, comorbidities and participant's personal goals.       Expected Outcomes  Short Term: Increase workloads from initial exercise prescription for resistance, speed, and METs.       Able to understand and use rate of perceived exertion (RPE) scale  Yes       Intervention  Provide education and explanation on how to use RPE scale       Expected Outcomes  Short Term: Able to use RPE daily in rehab to express subjective intensity level;Long Term:  Able to use RPE to guide intensity level when exercising independently       Able to understand and use Dyspnea scale  Yes       Intervention  Provide education and explanation on how to use Dyspnea scale       Expected Outcomes  Short Term: Able to use Dyspnea  scale daily in rehab to express subjective sense of shortness of breath during exertion;Long Term: Able to use Dyspnea scale to guide intensity level when exercising independently       Knowledge and understanding of Target Heart Rate Range (THRR)  Yes       Intervention  Provide education and explanation of THRR including how the numbers were predicted and where they are located for reference       Expected Outcomes  Short Term: Able to state/look up THRR;Long Term: Able to use THRR to govern intensity when exercising independently;Short Term: Able to use daily as guideline for intensity in rehab       Understanding of Exercise Prescription  Yes       Intervention  Provide education, explanation, and written materials on patient's individual exercise prescription       Expected Outcomes  Short Term: Able to explain program exercise prescription;Long Term: Able to explain home exercise prescription to exercise independently          Exercise Goals Re-Evaluation :   Discharge Exercise Prescription (Final Exercise Prescription Changes):   Nutrition:  Target Goals: Understanding of nutrition guidelines, daily intake of sodium 1500mg , cholesterol 200mg , calories 30% from fat and 7% or less from saturated fats, daily to have 5 or more servings of fruits and vegetables.  Biometrics: Pre Biometrics - 07/10/18 1011      Pre Biometrics   Grip Strength  28 kg        Nutrition Therapy Plan and Nutrition Goals:   Nutrition Assessments:   Nutrition Goals Re-Evaluation:   Nutrition Goals Discharge (Final Nutrition Goals Re-Evaluation):   Psychosocial: Target Goals: Acknowledge presence or absence of significant depression and/or stress, maximize coping skills, provide positive support system. Participant is able to verbalize types and ability to use techniques and skills needed for reducing stress  and depression.  Initial Review & Psychosocial Screening: Initial Psych Review &  Screening - 07/10/18 1130      Initial Review   Source of Stress Concerns  Family;Financial    Comments  pt lost husband a year and a half ago and has since had to find the new "normal" with her daughter.      Family Dynamics   Good Support System?  Yes       Quality of Life Scores:  Scores of 19 and below usually indicate a poorer quality of life in these areas.  A difference of  2-3 points is a clinically meaningful difference.  A difference of 2-3 points in the total score of the Quality of Life Index has been associated with significant improvement in overall quality of life, self-image, physical symptoms, and general health in studies assessing change in quality of life.  PHQ-9: Recent Review Flowsheet Data    Depression screen [facility] 2/9 07/10/2018 06/05/2014 05/23/2014 04/26/2014   Decreased Interest 0 0 0 -   Down, Depressed, Hopeless 0 0 0 0   PHQ - 2 Score 0 0 0 0   Altered sleeping 0 - - -   Tired, decreased energy 1 - - -   Change in appetite 0 - - -   Feeling bad or failure about yourself  0 - - -   Trouble concentrating 0 - - -   Moving slowly or fidgety/restless 0 - - -   Suicidal thoughts 0 - - -   PHQ-9 Score 1 - - -   Difficult doing work/chores Not difficult at all - - -     Interpretation of Total Score  Total Score Depression Severity:  1-4 = Minimal depression, 5-9 = Mild depression, 10-14 = Moderate depression, 15-19 = Moderately severe depression, 20-27 = Severe depression   Psychosocial Evaluation and Intervention: Psychosocial Evaluation - 07/10/18 1132      Psychosocial Evaluation & Interventions   Comments  pt lost husband a year and a half ago and states she has had stress finding the new normal with her grown daughter. Daughter is Education officer, museum and is looking to move to [place], which will be another change for her. Pt states she does have a group of friends that support her and she is able to get out with.    Expected Outcomes  pt will maintain a  posive and healthy outlook while maintaining positive coping skills    Continue Psychosocial Services   Follow up required by staff       Psychosocial Re-Evaluation:   Psychosocial Discharge (Final Psychosocial Re-Evaluation):   Education: Education Goals: Education classes will be provided on a weekly basis, covering required topics. Participant will state understanding/return demonstration of topics presented.  Learning Barriers/Preferences: Learning Barriers/Preferences - 07/10/18 1020      Learning Barriers/Preferences   Learning Barriers  Sight    Learning Preferences  Skilled Demonstration;Group Instruction;Individual Instruction;Audio       Education Topics: Risk Factor Reduction:  -Group instruction that is supported by a PowerPoint presentation. Instructor discusses the definition of a risk factor, different risk factors for pulmonary disease, and how the heart and lungs work together.     Nutrition for Pulmonary Patient:  -Group instruction provided by PowerPoint slides, verbal discussion, and written materials to support subject matter. The instructor gives an explanation and review of healthy diet recommendations, which includes a discussion on weight management, recommendations for fruit and vegetable consumption, as well as protein, fluid,  caffeine, fiber, sodium, sugar, and alcohol. Tips for eating when patients are short of breath are discussed.   Pursed Lip Breathing:  -Group instruction that is supported by demonstration and informational handouts. Instructor discusses the benefits of pursed lip and diaphragmatic breathing and detailed demonstration on how to preform both.     Oxygen Safety:  -Group instruction provided by PowerPoint, verbal discussion, and written material to support subject matter. There is an overview of "What is Oxygen" and "Why do we need it".  Instructor also reviews how to create a safe environment for oxygen use, the importance of using  oxygen as prescribed, and the risks of noncompliance. There is a brief discussion on traveling with oxygen and resources the patient may utilize.   Oxygen Equipment:  -Group instruction provided by [facility] Staff utilizing handouts, written materials, and equipment demonstrations.   Signs and Symptoms:  -Group instruction provided by written material and verbal discussion to support subject matter. Warning signs and symptoms of infection, stroke, and heart attack are reviewed and when to call the physician/911 reinforced. Tips for preventing the spread of infection discussed.   Advanced Directives:  -Group instruction provided by verbal instruction and written material to support subject matter. Instructor reviews Advanced Directive laws and proper instruction for filling out document.   Pulmonary Video:  -Group video education that reviews the importance of medication and oxygen compliance, exercise, good nutrition, pulmonary hygiene, and pursed lip and diaphragmatic breathing for the pulmonary patient.   Exercise for the Pulmonary Patient:  -Group instruction that is supported by a PowerPoint presentation. Instructor discusses benefits of exercise, core components of exercise, frequency, duration, and intensity of an exercise routine, importance of utilizing pulse oximetry during exercise, safety while exercising, and options of places to exercise outside of rehab.     Pulmonary Medications:  -Verbally interactive group education provided by instructor with focus on inhaled medications and proper administration.   Anatomy and Physiology of the Respiratory System and Intimacy:  -Group instruction provided by PowerPoint, verbal discussion, and written material to support subject matter. Instructor reviews respiratory cycle and anatomical components of the respiratory system and their functions. Instructor also reviews differences in obstructive and restrictive respiratory diseases with  examples of each. Intimacy, Sex, and Sexuality differences are reviewed with a discussion on how relationships can change when diagnosed with pulmonary disease. Common sexual concerns are reviewed.   MD DAY -A group question and answer session with a medical doctor that allows participants to ask questions that relate to their pulmonary disease state.   OTHER EDUCATION -Group or individual verbal, written, or video instructions that support the educational goals of the pulmonary rehab program.   Holiday Eating Survival Tips:  -Group instruction provided by PowerPoint slides, verbal discussion, and written materials to support subject matter. The instructor gives patients tips, tricks, and techniques to help them not only survive but enjoy the holidays despite the onslaught of food that accompanies the holidays.   Knowledge Questionnaire Score: Knowledge Questionnaire Score - 07/10/18 1122      Knowledge Questionnaire Score   Pre Score  15/18       Core Components/Risk Factors/Patient Goals at Admission: Personal Goals and Risk Factors at Admission - 07/10/18 1020      Core Components/Risk Factors/Patient Goals on Admission    Weight Management  Weight Loss;Yes    Intervention  Weight Management: Develop a combined nutrition and exercise program designed to reach desired caloric intake, while maintaining appropriate intake of nutrient and  fiber, sodium and fats, and appropriate energy expenditure required for the weight goal.    Admit Weight  171 lb 4.8 oz (77.7 kg)    Goal Weight: Short Term  159 lb (72.1 kg)    Goal Weight: Long Term  130 lb (59 kg)    Expected Outcomes  Weight Loss: Understanding of general recommendations for a balanced deficit meal plan, which promotes 1-2 lb weight loss per week and includes a negative energy balance of [phone] kcal/d;Short Term: Continue to assess and modify interventions until short term weight is achieved;Long Term: Adherence to nutrition and  physical activity/exercise program aimed toward attainment of established weight goal;Understanding of distribution of calorie intake throughout the day with the consumption of 4-5 meals/snacks;Understanding recommendations for meals to include 15-35% energy as protein, 25-35% energy from fat, 35-60% energy from carbohydrates, less than 200mg  of dietary cholesterol, 20-35 gm of total fiber daily    Improve shortness of breath with ADL's  Yes    Intervention  Provide education, individualized exercise plan and daily activity instruction to help decrease symptoms of SOB with activities of daily living.    Expected Outcomes  Short Term: Improve cardiorespiratory fitness to achieve a reduction of symptoms when performing ADLs    Hypertension  Yes    Intervention  Provide education on lifestyle modifcations including regular physical activity/exercise, weight management, moderate sodium restriction and increased consumption of fresh fruit, vegetables, and low fat dairy, alcohol moderation, and smoking cessation.;Monitor prescription use compliance.    Expected Outcomes  Short Term: Continued assessment and intervention until BP is < 140/26mm HG in hypertensive participants. < 130/88mm HG in hypertensive participants with diabetes, heart failure or chronic kidney disease.;Long Term: Maintenance of blood pressure at goal levels.    Stress  Yes    Intervention  Offer individual and/or small group education and counseling on adjustment to heart disease, stress management and health-related lifestyle change. Teach and support self-help strategies.;Refer participants experiencing significant psychosocial distress to appropriate mental health specialists for further evaluation and treatment. When possible, include family members and significant others in education/counseling sessions.    Expected Outcomes  Short Term: Participant demonstrates changes in health-related behavior, relaxation and other stress management  skills, ability to obtain effective social support, and compliance with psychotropic medications if prescribed.;Long Term: Emotional wellbeing is indicated by absence of clinically significant psychosocial distress or social isolation.       Core Components/Risk Factors/Patient Goals Review:  Goals and Risk Factor Review    Row Name 07/10/18 1023             Core Components/Risk Factors/Patient Goals Review   Personal Goals Review  Weight Management/Obesity;Improve shortness of breath with ADL's;Increase knowledge of respiratory medications and ability to use respiratory devices properly.;Develop more efficient breathing techniques such as purse lipped breathing and diaphragmatic breathing and practicing self-pacing with activity.;Hypertension;Stress          Core Components/Risk Factors/Patient Goals at Discharge (Final Review):  Goals and Risk Factor Review - 07/10/18 1023      Core Components/Risk Factors/Patient Goals Review   Personal Goals Review  Weight Management/Obesity;Improve shortness of breath with ADL's;Increase knowledge of respiratory medications and ability to use respiratory devices properly.;Develop more efficient breathing techniques such as purse lipped breathing and diaphragmatic breathing and practicing self-pacing with activity.;Hypertension;Stress       ITP Comments: ITP Comments    Row Name 07/10/18 0951           ITP Comments  Dr. Michaelene Song  Nelda [place], Medical Director, Pulmonary Director          Comments:

## 2018-07-18 ENCOUNTER — Encounter (HOSPITAL_COMMUNITY)
Admission: RE | Admit: 2018-07-18 | Discharge: 2018-07-18 | Disposition: A | Discharge status: Home / Self Care | Payer: Managed Care, Other (non HMO) | Source: Ambulatory Visit | Attending: Emergency Medicine | Admitting: Emergency Medicine

## 2018-07-18 DIAGNOSIS — J449 Chronic obstructive pulmonary disease, unspecified: Secondary | ICD-10-CM

## 2018-07-18 NOTE — Progress Notes (Signed)
Daily Session Note  Patient Details  Name: ADREONA BRAND MRN: [id number] Date of Birth: 08-Sep-1955 Referring Provider:     Pulmonary Rehab Walk Test from 07/11/2018 in [place]  Referring Provider  Dr. Lamonte Sakai      Encounter Date: 07/18/2018  Check In: Session Check In - 07/18/18 1330      Check-In   Supervising physician immediately available to respond to emergencies  [facility] immediately available    Physician(s)  Dr. Broadus John    Location  [facility]    Staff Present  Rosebud Poles, RN, BSN;Molly DiVincenzo, MS, ACSM RCEP, Exercise Physiologist;Kelsey Durflinger Ysidro Evert, RN;Dalton Fletcher, MS, Exercise Physiologist    Medication changes reported      No    Fall or balance concerns reported     No    Tobacco Cessation  No Change    Warm-up and Cool-down  Performed as group-led instruction    Resistance Training Performed  Yes    VAD Patient?  No    PAD/SET Patient?  No      Pain Assessment   Currently in Pain?  No/denies       Capillary Blood Glucose: No results found for this or any previous visit (from the past 24 hour(s)).    Social History   Tobacco Use  Smoking Status Former Smoker  . Packs/day: 0.50  . Years: 40.00  . Pack years: 20.00  . Types: Cigarettes  . Last attempt to quit: 03/17/2013  . Years since quitting: 5.3  Smokeless Tobacco Never Used    Goals Met:  Exercise tolerated well No report of cardiac concerns or symptoms Strength training completed today  Goals Unmet:  Not Applicable  Comments: Service time is from 1330 to 1500    Dr. Rush Farmer is Medical Director for Pulmonary Rehab at [facility].

## 2018-07-20 ENCOUNTER — Encounter (HOSPITAL_COMMUNITY)
Admission: RE | Admit: 2018-07-20 | Discharge: 2018-07-20 | Disposition: A | Discharge status: Home / Self Care | Payer: Managed Care, Other (non HMO) | Source: Ambulatory Visit | Attending: Emergency Medicine | Admitting: Emergency Medicine

## 2018-07-20 DIAGNOSIS — J449 Chronic obstructive pulmonary disease, unspecified: Secondary | ICD-10-CM

## 2018-07-20 NOTE — Progress Notes (Signed)
Daily Session Note  Patient Details  Name: TANESHA ARAMBULA MRN: [id number] Date of Birth: December 01, 1955 Referring Provider:     Pulmonary Rehab Walk Test from 07/11/2018 in [place]  Referring Provider  Dr. Lamonte Sakai      Encounter Date: 07/20/2018  Check In: Session Check In - 07/20/18 1355      Check-In   Supervising physician immediately available to respond to emergencies  [facility] immediately available    Physician(s)  Dr. Berle Mull    Location  [facility]    Staff Present  Rosebud Poles, RN, BSN;Molly DiVincenzo, MS, ACSM RCEP, Exercise Physiologist;Lisa Ysidro Evert, RN;Dalton Kris Mouton, MS, Exercise Physiologist;Carlette Wilber Oliphant, RN, BSN    Medication changes reported      No    Fall or balance concerns reported     No    Tobacco Cessation  No Change    Warm-up and Cool-down  Performed as group-led instruction    Resistance Training Performed  Yes    VAD Patient?  No    PAD/SET Patient?  No      Pain Assessment   Currently in Pain?  No/denies    Multiple Pain Sites  No       Capillary Blood Glucose: No results found for this or any previous visit (from the past 24 hour(s)).    Social History   Tobacco Use  Smoking Status Former Smoker  . Packs/day: 0.50  . Years: 40.00  . Pack years: 20.00  . Types: Cigarettes  . Last attempt to quit: 03/17/2013  . Years since quitting: 5.3  Smokeless Tobacco Never Used    Goals Met:  Proper associated with RPD/PD & O2 Sat Exercise tolerated well Strength training completed today  Goals Unmet:  Not Applicable  Comments: Service time is from 1330 to 1525    Dr. Rush Farmer is Medical Director for Pulmonary Rehab at [facility].

## 2018-07-21 NOTE — Progress Notes (Signed)
[name] [name] 63 y.o. female  DOB: 01-26-1956 MRN: [id number]           Nutrition Note 1. Chronic obstructive pulmonary disease, unspecified COPD type ([place])    Past Medical History:  Diagnosis Date  . Anxiety    husband also cancer pt  . Cancer ([place])    lung  . Complication of anesthesia    bronchospasms post-op (pulmonary did consult but pt was discharged later that day)  With last surg. she was given albuterol inhaler & had steroid with surg.    . Congenital renal atrophy    left kidney, one spontaneous stone passed   . COPD (chronic obstructive pulmonary disease) ([place])   . Coronary artery disease   . Degenerative arthritis    spine, hands & knees   . GERD (gastroesophageal reflux disease)   . History of migraine   . Hypertension    h/o PVC- asymptomatic   . Memory disorder 03/29/2017  . 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 PONV (postoperative nausea and vomiting)   . PVC (premature ventricular contraction)   . Shortness of breath dyspnea    with exertion  . Thyroid nodule    benign by biopsy  . Varicose veins    left leg  . Visual disturbance    Episodic  . Vitamin D deficiency   . Wears glasses    Meds reviewed.     Current Outpatient Medications (Cardiovascular):  .  rosuvastatin (CRESTOR) 10 MG tablet, Take 1 tablet (10 mg total) by mouth daily. Please make annual appt for future refills. [phone] you. .  verapamil (CALAN-SR) 240 MG CR tablet, Take 1 tablet (240 mg total) by mouth at bedtime.   Current Outpatient Medications (Respiratory):  .  albuterol (PROAIR HFA) 108 (90 Base) MCG/ACT inhaler, Inhale 1 puff into the lungs 3 (three) times daily as needed for wheezing or shortness of breath.   Current Outpatient Medications (Analgesics):  .  aspirin EC 81 MG tablet, Take 1 tablet (81 mg total) by mouth daily.     Current Outpatient Medications (Other):  [name] Kitchen   Vitamin D, Ergocalciferol, (DRISDOL) 50000 units CAPS capsule, Take 50,000 Units by mouth every 7 (seven) days.  Current Facility-Administered Medications (Other):  [name] Kitchen  Ampicillin-Sulbactam (UNASYN) 3 g in sodium chloride 0.9 % 100 mL IVPB   Ht: Ht Readings from Last 1 Encounters:  07/10/18 5' 5.5" (1.664 m)     Wt:  Wt Readings from Last 6 Encounters:  07/10/18 171 lb 4.8 oz (77.7 kg)  06/05/18 163 lb (73.9 kg)  05/22/18 167 lb 6.4 oz (75.9 kg)  01/31/18 167 lb 14.4 oz (76.2 kg)  11/28/17 165 lb (74.8 kg)  09/29/17 161 lb (73 kg)     BMI: 28.07    Current tobacco use? No    Labs:  Lipid Panel     Component Value Date/Time   CHOL 139 10/11/2017 1023   TRIG 54 10/11/2017 1023   HDL 73 10/11/2017 1023   CHOLHDL 1.9 10/11/2017 1023   LDLCALC 55 10/11/2017 1023    No results found for: HGBA1C  Nutrition Diagnosis ? Food-and nutrition-related knowledge deficit related to lack of exposure to information as related to diagnosis of pulmonary disease ? Overweight/obesity related to excessive energy intake as evidenced by a BMI of 28.07   Goal(s) 1. Identify food quantities necessary to achieve wt loss of  -2# per week  to a goal wt loss of 2.7-10.9 kg (6-24 lb) at graduation from pulmonary rehab. 2. Pt to build a healthy plate including fruits, vegetables, whole grains, and low-fat dairy products in a healthy meal plan. 3. Weigh and measure portions for accuracy.  Plan:  Pt to attend Pulmonary Nutrition class Will provide client-centered nutrition education as part of interdisciplinary care.    Monitor and Evaluate progress toward nutrition goal with team.   Laurina Bustle, MS, RD, LDN 07/21/2018 10:35 AM

## 2018-07-25 ENCOUNTER — Encounter (HOSPITAL_COMMUNITY)
Admission: RE | Admit: 2018-07-25 | Discharge: 2018-07-25 | Disposition: A | Discharge status: Home / Self Care | Payer: Managed Care, Other (non HMO) | Source: Ambulatory Visit | Attending: Emergency Medicine | Admitting: Emergency Medicine

## 2018-07-25 VITALS — Wt 170.4 lb

## 2018-07-25 DIAGNOSIS — J449 Chronic obstructive pulmonary disease, unspecified: Secondary | ICD-10-CM | POA: Diagnosis not present

## 2018-07-25 NOTE — Progress Notes (Signed)
Daily Session Note  Patient Details  Name: [name] [name] MRN: [id number] Date of Birth: 05-21-1956 Referring Provider:     Pulmonary Rehab Walk Test from 07/11/2018 in [place]  Referring Provider  Dr. Lamonte Sakai      Encounter Date: 07/25/2018  Check In: Session Check In - 07/25/18 1409      Check-In   Supervising physician immediately available to respond to emergencies  [facility] immediately available    Physician(s)  Dr. Berle Mull    Location  [facility]    Staff Present  Maurice Small, RN, Maxcine Ham, RN, BSN;Molly DiVincenzo, MS, ACSM RCEP, Exercise Physiologist;Dalton Kris Mouton, MS, Exercise Physiologist;Geordie Nooney Ysidro Evert, RN    Medication changes reported      No    Fall or balance concerns reported     No    Tobacco Cessation  No Change    Warm-up and Cool-down  Performed as group-led instruction    Resistance Training Performed  Yes    PAD/SET Patient?  No      Pain Assessment   Currently in Pain?  No/denies    Multiple Pain Sites  No       Capillary Blood Glucose: No results found for this or any previous visit (from the past 24 hour(s)).  Exercise Prescription Changes - 07/25/18 1600      Response to Exercise   Blood Pressure (Admit)  122/78    Blood Pressure (Exercise)  130/70    Blood Pressure (Exit)  134/80    Heart Rate (Admit)  79 bpm    Heart Rate (Exercise)  119 bpm    Heart Rate (Exit)  90 bpm    Oxygen Saturation (Admit)  97 %    Oxygen Saturation (Exercise)  95 %    Oxygen Saturation (Exit)  96 %    Rating of Perceived Exertion (Exercise)  12    Perceived Dyspnea (Exercise)  1.5    Duration  Continue with 45 min of aerobic exercise without signs/symptoms of physical distress.    Intensity  Other (comment)   40-80% of HRR     Progression   Progression  Continue to progress workloads to maintain intensity without signs/symptoms of physical distress.      Resistance Training   Training Prescription  Yes    Weight  orange bands    Reps  10-15    Time  10 Minutes      NuStep   Level  3    SPM  80    Minutes  17    METs  2.4      Arm Ergometer   Level  1    Watts  10    Minutes  17      Track   Laps  21    Minutes  17       Social History   Tobacco Use  Smoking Status Former Smoker  . Packs/day: 0.50  . Years: 40.00  . Pack years: 20.00  . Types: Cigarettes  . Last attempt to quit: 03/17/2013  . Years since quitting: 5.3  Smokeless Tobacco Never Used    Goals Met:  Exercise tolerated well No report of cardiac concerns or symptoms Strength training completed today  Goals Unmet:  Not Applicable  Comments: Service time is from 1330 to 1500    Dr. Rush Farmer is Medical Director for Pulmonary Rehab at [facility].

## 2018-07-26 ENCOUNTER — Ambulatory Visit
Admission: RE | Admit: 2018-07-26 | Discharge: 2018-07-26 | Disposition: A | Discharge status: Home / Self Care | Payer: Managed Care, Other (non HMO) | Source: Ambulatory Visit | Attending: Internal Medicine | Admitting: Internal Medicine

## 2018-07-26 ENCOUNTER — Inpatient Hospital Stay: Payer: Managed Care, Other (non HMO) | Attending: Internal Medicine

## 2018-07-26 ENCOUNTER — Inpatient Hospital Stay: Payer: Managed Care, Other (non HMO)

## 2018-07-26 DIAGNOSIS — C3490 Malignant neoplasm of unspecified part of unspecified bronchus or lung: Secondary | ICD-10-CM

## 2018-07-26 DIAGNOSIS — Z85118 Personal history of other malignant neoplasm of bronchus and lung: Secondary | ICD-10-CM | POA: Insufficient documentation

## 2018-07-26 LAB — CMP (CANCER CENTER ONLY)
ALT: 19 U/L (ref 0–44)
AST: 20 U/L (ref 15–41)
Albumin: 3.8 g/dL (ref 3.5–5.0)
Alkaline Phosphatase: 114 U/L (ref 38–126)
Anion gap: 10 (ref 5–15)
BUN: 14 mg/dL (ref 8–23)
CO2: 23 mmol/L (ref 22–32)
Calcium: 9.2 mg/dL (ref 8.9–10.3)
Chloride: 108 mmol/L (ref 98–111)
Creatinine: 0.83 mg/dL (ref 0.44–1.00)
GFR, Est AFR Am: >60 mL/min (ref >60)
GFR, Estimated: >60 mL/min (ref >60)
GLUCOSE: 99 mg/dL (ref 70–99)
Potassium: 4.3 mmol/L (ref 3.5–5.1)
Sodium: 141 mmol/L (ref 135–145)
TOTAL PROTEIN: 6.9 g/dL (ref 6.5–8.1)
Total Bilirubin: 0.4 mg/dL (ref 0.3–1.2)

## 2018-07-26 LAB — CBC WITH DIFFERENTIAL (CANCER CENTER ONLY)
Abs Immature Granulocytes: 0.01 10*3/uL (ref 0.00–0.07)
Basophils Absolute: 0.1 10*3/uL (ref 0.0–0.1)
Basophils Relative: 1 %
Eosinophils Absolute: 0.3 10*3/uL (ref 0.0–0.5)
Eosinophils Relative: 4 %
HCT: 43 % (ref 36.0–46.0)
Hemoglobin: 13.6 g/dL (ref 12.0–15.0)
Immature Granulocytes: 0 %
Lymphocytes Relative: 25 %
Lymphs Abs: 1.6 10*3/uL (ref 0.7–4.0)
MCH: 29.6 pg (ref 26.0–34.0)
MCHC: 31.6 g/dL (ref 30.0–36.0)
MCV: 93.7 fL (ref 80.0–100.0)
MONO ABS: 0.5 10*3/uL (ref 0.1–1.0)
Monocytes Relative: 8 %
Neutro Abs: 4.1 10*3/uL (ref 1.7–7.7)
Neutrophils Relative %: 62 %
Platelet Count: 243 10*3/uL (ref 150–400)
RBC: 4.59 MIL/uL (ref 3.87–5.11)
RDW: 12.7 % (ref 11.5–15.5)
WBC Count: 6.5 10*3/uL (ref 4.0–10.5)
nRBC: 0 % (ref 0.0–0.2)

## 2018-07-26 MED ORDER — IOPAMIDOL (ISOVUE-300) INJECTION 61%
75.0000 mL | Freq: Once | INTRAVENOUS | Status: AC | PRN
  Administered 2018-07-26: 75 mL via INTRAVENOUS

## 2018-07-27 ENCOUNTER — Telehealth: Payer: Self-pay | Admitting: Interventional Cardiology

## 2018-07-27 ENCOUNTER — Other Ambulatory Visit: Payer: Self-pay

## 2018-07-27 ENCOUNTER — Encounter (HOSPITAL_COMMUNITY)
Admission: RE | Admit: 2018-07-27 | Discharge: 2018-07-27 | Disposition: A | Discharge status: Home / Self Care | Payer: Managed Care, Other (non HMO) | Source: Ambulatory Visit | Attending: Emergency Medicine | Admitting: Emergency Medicine

## 2018-07-27 DIAGNOSIS — J449 Chronic obstructive pulmonary disease, unspecified: Secondary | ICD-10-CM

## 2018-07-27 MED ORDER — VERAPAMIL HCL ER 240 MG PO TBCR: 240.0000 mg | EXTENDED_RELEASE_TABLET | Freq: Every day | ORAL | 0 refills | Status: DC

## 2018-07-27 MED ORDER — ROSUVASTATIN CALCIUM 10 MG PO TABS: 10.0000 mg | ORAL_TABLET | Freq: Every day | ORAL | 0 refills | Status: DC

## 2018-07-27 NOTE — Progress Notes (Signed)
Daily Session Note  Patient Details  Name: [name] [name] MRN: [id number] Date of Birth: 05-29-1956 Referring Provider:     Pulmonary Rehab Walk Test from 07/11/2018 in [place]  Referring Provider  Dr. Lamonte Sakai      Encounter Date: 07/27/2018  Check In: Session Check In - 07/27/18 1330      Check-In   Supervising physician immediately available to respond to emergencies  [facility] immediately available    Physician(s)  Dr. Herbert Moors    Location  [facility]    Staff Present  Joycelyn Man RN, BSN;Donetta Isaza Leonia Reeves, RN, BSN;Molly DiVincenzo, MS, ACSM RCEP, Exercise Physiologist;Dalton Kris Mouton, MS, Exercise Physiologist    Medication changes reported      No    Fall or balance concerns reported     No    Tobacco Cessation  No Change    Warm-up and Cool-down  Performed as group-led instruction    Resistance Training Performed  Yes    VAD Patient?  No    PAD/SET Patient?  No      Pain Assessment   Currently in Pain?  No/denies    Pain Score  0-No pain    Multiple Pain Sites  No       Capillary Blood Glucose: No results found for this or any previous visit (from the past 24 hour(s)).    Social History   Tobacco Use  Smoking Status Former Smoker  . Packs/day: 0.50  . Years: 40.00  . Pack years: 20.00  . Types: Cigarettes  . Last attempt to quit: 03/17/2013  . Years since quitting: 5.3  Smokeless Tobacco Never Used    Goals Met:  Proper associated with RPD/PD & O2 Sat Exercise tolerated well Strength training completed today  Goals Unmet:  Not Applicable  Comments: Service time is from 1330 to 1500   Dr. Rush Farmer is Medical Director for Pulmonary Rehab at [facility].

## 2018-07-27 NOTE — Telephone Encounter (Signed)
° °  Patient requesting medications go to different locations    1. Which medications need to be refilled? (please list name of each medication and dose if known) verapamil (CALAN-SR) 240 MG CR tablet  2. Which pharmacy/location (including street and city if local pharmacy) is medication to be sent to? [place]  3. Do they need a 30 day or 90 day supply? 90     1. Which medications need to be refilled? (please list name of each medication and dose if known) rosuvastatin (CRESTOR) 10 MG tablet   2. Which pharmacy/location (including street and city if local pharmacy) is medication to be sent to? [id number] - [place], [place] - [address]  3. Do they need a 30 day or 90 day supply? [place]

## 2018-08-01 ENCOUNTER — Encounter (HOSPITAL_COMMUNITY)
Admission: RE | Admit: 2018-08-01 | Discharge: 2018-08-01 | Disposition: A | Discharge status: Home / Self Care | Payer: Managed Care, Other (non HMO) | Source: Ambulatory Visit | Attending: Emergency Medicine | Admitting: Emergency Medicine

## 2018-08-01 DIAGNOSIS — J449 Chronic obstructive pulmonary disease, unspecified: Secondary | ICD-10-CM

## 2018-08-01 NOTE — Progress Notes (Signed)
I have reviewed a Home Exercise Prescription with [name] . [name] [name] is not currently exercising at home.  The patient was advised to walk 2 days a week for 30 minutes.  [name] and I discussed how to progress their exercise prescription.  The patient stated that their goals were to preserve the lung function that she has and remain indepedent.  The patient stated that they understand the exercise prescription.  We reviewed exercise guidelines, target heart rate during exercise, RPE Scale, weather conditions, NTG use, endpoints for exercise, warmup and cool down.  Patient is encouraged to come to me with any questions. I will continue to follow up with the patient to assist them with progression and safety.

## 2018-08-02 ENCOUNTER — Telehealth: Payer: Self-pay | Admitting: Internal Medicine

## 2018-08-02 ENCOUNTER — Encounter: Payer: Self-pay | Admitting: Internal Medicine

## 2018-08-02 ENCOUNTER — Inpatient Hospital Stay (HOSPITAL_BASED_OUTPATIENT_CLINIC_OR_DEPARTMENT_OTHER): Payer: Managed Care, Other (non HMO) | Admitting: Internal Medicine

## 2018-08-02 VITALS — BP 134/75 | HR 83 | Temp 97.7°F | Resp 20 | Ht 65.0 in | Wt 169.3 lb

## 2018-08-02 DIAGNOSIS — Z85118 Personal history of other malignant neoplasm of bronchus and lung: Secondary | ICD-10-CM

## 2018-08-02 DIAGNOSIS — C3490 Malignant neoplasm of unspecified part of unspecified bronchus or lung: Secondary | ICD-10-CM

## 2018-08-02 DIAGNOSIS — C3431 Malignant neoplasm of lower lobe, right bronchus or lung: Secondary | ICD-10-CM

## 2018-08-02 NOTE — Telephone Encounter (Signed)
Scheduled appt per 02/26 los.  Patient wanted her lab/Ct appt one week prior to her MD visit.

## 2018-08-02 NOTE — Progress Notes (Signed)
[place] [phone]   [phone]  OFFICE PROGRESS NOTE  [name], [place] [address]  DIAGNOSIS: Primary cancer of right lower lobe of lung  Staging form: Lung, AJCC 7th Edition  Clinical: Stage IIIA (T2a, N2, M0) - Unsigned  Staging comments: Adenocarcinoma  Foundation 1 molecular studies: positive for YMEBR830N, KRASG13C, [id number]*, [id number]*11, GATA6 amplification and [id number] Negative for BRAF, ALK, MET, RET and ERBB2  PRIOR THERAPY:  1) Concurrent chemoradiation with chemotherapy in the form of weekly carboplatin for an AUC of 2 and paclitaxel 45 mg/m given concurrent with radiation 2) Right VATS (video-assisted thoracoscopic surgery) with right lower lobectomy and mediastinal lymph node dissection under the care of [name] 07/23/2014. The final pathology showed residual 1.8 cm moderate to poorly differentiated invasive adenocarcinoma with no pleural or lymphovascular invasion. The dissected lymph nodes were negative for malignancy and the final pathologic stage was (pT1a, pN0, pMx).  CURRENT THERAPY: Observation.   INTERVAL HISTORY: [name] GAYTON89y.o. female returns to the clinic today for 6 months follow-up visit.  The patient is feeling fine today with no concerning complaints.  She is currently undergoing cardiopulmonary rehabilitation.  She denied having any chest pain, shortness breath, cough or hemoptysis.  She has no nausea, vomiting, diarrhea or constipation.  She had repeat CT scan of the chest performed recently and she is here for evaluation and discussion of her scan results.  MEDICAL HISTORY: Past Medical History:  Diagnosis Date  . Anxiety    husband also cancer pt  . Cancer ([place]    lung  . Complication of anesthesia    bronchospasms post-op (pulmonary did consult but pt was discharged later that day)  With last surg. she was given albuterol inhaler & had  steroid with surg.    . Congenital renal atrophy    left kidney, one spontaneous stone passed   . COPD (chronic obstructive pulmonary disease) ([place]   . Coronary artery disease   . Degenerative arthritis    spine, hands & knees   . GERD (gastroesophageal reflux disease)   . History of migraine   . Hypertension    h/o PVC- asymptomatic   . Memory disorder 03/29/2017  . 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 PONV (postoperative nausea and vomiting)   . PVC (premature ventricular contraction)   . Shortness of breath dyspnea    with exertion  . Thyroid nodule    benign by biopsy  . Varicose veins    left leg  . Visual disturbance    Episodic  . Vitamin D deficiency   . Wears glasses     ALLERGIES:  is allergic to advil [ibuprofen]; hydrocodone-guaifenesin; sulfa antibiotics; temazepam; plasticized base [plastibase]; and zofran [ondansetron hcl].  MEDICATIONS:  Current Outpatient Medications  Medication Sig Dispense Refill  . albuterol (PROAIR HFA) 108 (90 Base) MCG/ACT inhaler Inhale 1 puff into the lungs 3 (three) times daily as needed for wheezing or shortness of breath. 1 Inhaler 2  . aspirin EC 81 MG tablet Take 1 tablet (81 mg total) by mouth daily. 90 tablet 3  . rosuvastatin (CRESTOR) 10 MG tablet Take 1 tablet (10 mg total) by mouth daily. 90 tablet 0  . verapamil (CALAN-SR) 240 MG CR tablet Take 1 tablet (240 mg total) by mouth at bedtime. 90 tablet 0  . Vitamin D,  Ergocalciferol, (DRISDOL) 50000 units CAPS capsule Take 50,000 Units by mouth every 7 (seven) days.     Current Facility-Administered Medications  Medication Dose Route Frequency Provider Last Rate Last Dose  . Ampicillin-Sulbactam (UNASYN) 3 g in sodium chloride 0.9 % 100 mL IVPB  3 g Intravenous Once Avelina Laine, PA-C        SURGICAL HISTORY:  Past Surgical History:  Procedure Laterality Date  . CARPAL TUNNEL RELEASE Left     . CESAREAN SECTION    . COLONOSCOPY W/ BIOPSIES AND POLYPECTOMY    . DILATION AND CURETTAGE OF UTERUS    . TONSILLECTOMY    . VARICOSE VEIN SURGERY     left  . VIDEO ASSISTED THORACOSCOPY (VATS)/ LOBECTOMY Right 07/23/2014   Procedure: VIDEO ASSISTED THORACOSCOPY (VATS)/ LOBECTOMY;  Surgeon: Ivin Poot, MD;  Location: [facility];  Service: Thoracic;  Laterality: Right;  [name] Kitchen VIDEO BRONCHOSCOPY N/A 07/23/2014   Procedure: VIDEO BRONCHOSCOPY;  Surgeon: Ivin Poot, MD;  Location: [facility];  Service: Thoracic;  Laterality: N/A;  . VIDEO BRONCHOSCOPY WITH ENDOBRONCHIAL ULTRASOUND Right 04/17/2014   Procedure: VIDEO BRONCHOSCOPY WITH ENDOBRONCHIAL ULTRASOUND;  Surgeon: Collene Gobble, MD;  Location: [facility];  Service: Thoracic;  Laterality: Right;  [name] Kitchen VIDEO BRONCHOSCOPY WITH ENDOBRONCHIAL ULTRASOUND N/A 07/08/2014   Procedure: VIDEO BRONCHOSCOPY WITH ENDOBRONCHIAL ULTRASOUND;  Surgeon: Ivin Poot, MD;  Location: [facility] OR;  Service: Thoracic;  Laterality: N/A;    REVIEW OF SYSTEMS:  A comprehensive review of systems was negative.   PHYSICAL EXAMINATION: General appearance: alert, cooperative and no distress Head: Normocephalic, without obvious abnormality, atraumatic Neck: no adenopathy, no JVD, supple, symmetrical, trachea midline and thyroid not enlarged, symmetric, no tenderness/mass/nodules Lymph nodes: Cervical, supraclavicular, and axillary nodes normal. Resp: clear to auscultation bilaterally Back: symmetric, no curvature. ROM normal. No CVA tenderness. Cardio: regular rate and rhythm, S1, S2 normal, no murmur, click, rub or gallop GI: soft, non-tender; bowel sounds normal; no masses,  no organomegaly Extremities: extremities normal, atraumatic, no cyanosis or edema  ECOG PERFORMANCE STATUS: 0 - Asymptomatic  Blood pressure 134/75, pulse 83, temperature 97.7 F (36.5 C), temperature source Oral, resp. rate 20, height 5' 5"  (1.651 m), weight 169 lb 4.8 oz (76.8 kg), SpO2 97 %.  LABORATORY  DATA: Lab Results  Component Value Date   WBC 6.5 07/26/2018   HGB 13.6 07/26/2018   HCT 43.0 07/26/2018   MCV 93.7 07/26/2018   PLT 243 07/26/2018      Chemistry      Component Value Date/Time   NA 141 07/26/2018 0751   NA 142 01/19/2017 0958   K 4.3 07/26/2018 0751   K 4.3 01/19/2017 0958   CL 108 07/26/2018 0751   CO2 23 07/26/2018 0751   CO2 25 01/19/2017 0958   BUN 14 07/26/2018 0751   BUN 15.2 01/19/2017 0958   CREATININE 0.83 07/26/2018 0751   CREATININE 0.8 01/19/2017 0958      Component Value Date/Time   CALCIUM 9.2 07/26/2018 0751   CALCIUM 9.5 01/19/2017 0958   ALKPHOS 114 07/26/2018 0751   ALKPHOS 121 01/19/2017 0958   AST 20 07/26/2018 0751   AST 19 01/19/2017 0958   ALT 19 07/26/2018 0751   ALT 20 01/19/2017 0958   BILITOT 0.4 07/26/2018 0751   BILITOT 0.64 01/19/2017 0958       RADIOGRAPHIC STUDIES: Ct Chest W Contrast  Result Date: 07/27/2018 CLINICAL DATA:  Stage IIIA right lower lobe lung adenocarcinoma status post chemo radiation therapy  and right lower lobectomy. Restaging. Interval observation. EXAM: CT CHEST WITH CONTRAST TECHNIQUE: Multidetector CT imaging of the chest was performed during intravenous contrast administration. CONTRAST:  60m ISOVUE-300 IOPAMIDOL (ISOVUE-300) INJECTION 61% COMPARISON:  01/24/2018 chest CT. FINDINGS: Cardiovascular: Normal heart size. No significant pericardial effusion/thickening. Left anterior descending and right coronary atherosclerosis. Great vessels are normal in course and caliber. No central pulmonary emboli. Mediastinum/Nodes: Stable hypodense 0.9 cm right thyroid lobe nodule. Unremarkable esophagus. No pathologically enlarged axillary, mediastinal or hilar lymph nodes. Lungs/Pleura: No pneumothorax. No pleural effusion. Status post right lower lobectomy. Moderate centrilobular emphysema. No acute consolidative airspace disease or lung masses. Left lower lobe 2 mm solid pulmonary nodule (series 8/image 81) is  stable since at least 01/19/2017 and considered benign. No new significant pulmonary nodules. Upper abdomen: Simple 1.6 cm left liver lobe cyst. Musculoskeletal: No aggressive appearing focal osseous lesions. Moderate thoracic spondylosis. IMPRESSION: 1. No evidence of local tumor recurrence status post right lower lobectomy. 2. No findings of metastatic disease in the chest. Emphysema (ICD10-J43.9). Electronically Signed   By: JIlona SorrelM.D.   On: 07/27/2018 07:04    ASSESSMENT AND PLAN:  This is a very pleasant 63years old white female with a stage IIIA non-small cell lung cancer status post neoadjuvant concurrent chemoradiation with weekly carboplatin and paclitaxel followed by right lower lobectomy and lymph node dissection with the final pathology consistent with a pathological stage pT1a, pN0. The patient is currently on observation and she is feeling fine. Repeat CT scan of the chest showed no concerning findings for disease recurrence or progression. I discussed the scan results with the patient and recommended for her to continue on observation with repeat CT scan of the chest in 6 months. She was advised to call immediately if she has any other concerning symptoms in the interval. The patient voices understanding of current disease status and treatment options and is in agreement with the current care plan. All questions were answered. The patient knows to call the clinic with any problems, questions or concerns. We can certainly see the patient much sooner if necessary. I spent 10 minutes counseling the patient face to face. The total time spent in the appointment was 15 minutes.  Disclaimer: This note was dictated with voice recognition software. Similar sounding words can inadvertently be transcribed and may not be corrected upon review.

## 2018-08-03 ENCOUNTER — Encounter (HOSPITAL_COMMUNITY)
Admission: RE | Admit: 2018-08-03 | Discharge: 2018-08-03 | Disposition: A | Discharge status: Home / Self Care | Payer: Managed Care, Other (non HMO) | Source: Ambulatory Visit | Attending: Emergency Medicine | Admitting: Emergency Medicine

## 2018-08-03 DIAGNOSIS — J449 Chronic obstructive pulmonary disease, unspecified: Secondary | ICD-10-CM | POA: Diagnosis not present

## 2018-08-03 NOTE — Progress Notes (Signed)
Daily Session Note  Patient Details  Name: [name] [name] MRN: [id number] Date of Birth: 07/06/1955 Referring Provider:     Pulmonary Rehab Walk Test from 07/11/2018 in [place]  Referring Provider  Dr. Lamonte Sakai      Encounter Date: 08/03/2018  Check In: Session Check In - 08/03/18 [place]      Check-In   Supervising physician immediately available to respond to emergencies  [facility] immediately available    Physician(s)  Dr. Verlon Au    Location  [facility]    Staff Present  Rosebud Poles, RN, BSN;Molly DiVincenzo, MS, ACSM RCEP, Exercise Physiologist;Dalton Kris Mouton, MS, Exercise Physiologist;Lisa Ysidro Evert, RN    Medication changes reported      No    Fall or balance concerns reported     No    Tobacco Cessation  No Change    Warm-up and Cool-down  Performed as group-led instruction    Resistance Training Performed  Yes    VAD Patient?  No    PAD/SET Patient?  No      Pain Assessment   Currently in Pain?  No/denies    Multiple Pain Sites  No       Capillary Blood Glucose: No results found for this or any previous visit (from the past 24 hour(s)).    Social History   Tobacco Use  Smoking Status Former Smoker  . Packs/day: 0.50  . Years: 40.00  . Pack years: 20.00  . Types: Cigarettes  . Last attempt to quit: 03/17/2013  . Years since quitting: 5.3  Smokeless Tobacco Never Used    Goals Met:  Proper associated with RPD/PD & O2 Sat Exercise tolerated well Strength training completed today  Goals Unmet:  Not Applicable  Comments: Service time is from 1330 to 1510   Dr. Rush Farmer is Medical Director for Pulmonary Rehab at [facility]

## 2018-08-08 ENCOUNTER — Encounter (HOSPITAL_COMMUNITY)
Admission: RE | Admit: 2018-08-08 | Discharge: 2018-08-08 | Disposition: A | Discharge status: Home / Self Care | Payer: Managed Care, Other (non HMO) | Source: Ambulatory Visit | Attending: Emergency Medicine | Admitting: Emergency Medicine

## 2018-08-08 VITALS — Wt 170.2 lb

## 2018-08-08 DIAGNOSIS — J449 Chronic obstructive pulmonary disease, unspecified: Secondary | ICD-10-CM | POA: Diagnosis present

## 2018-08-08 NOTE — Progress Notes (Signed)
Daily Session Note  Patient Details  Name: [name] [name] MRN: [id number] Date of Birth: 1956-05-31 Referring Provider:     Pulmonary Rehab Walk Test from 07/11/2018 in [place]  Referring Provider  Dr. Lamonte Sakai      Encounter Date: 08/08/2018  Check In: Session Check In - 08/08/18 1352      Check-In   Supervising physician immediately available to respond to emergencies  [facility] immediately available    Physician(s)  Dr. Maylene Roes    Location  [facility]    Staff Present  Joycelyn Man RN, BSN;Lisa Ysidro Evert, RN;Olinty Celesta Aver, MS, ACSM CEP, Exercise Physiologist    Medication changes reported      No    Fall or balance concerns reported     No    Tobacco Cessation  No Change    Warm-up and Cool-down  Performed as group-led instruction    Resistance Training Performed  Yes    VAD Patient?  No    PAD/SET Patient?  No      Pain Assessment   Currently in Pain?  No/denies    Pain Score  0-No pain    Multiple Pain Sites  No       Capillary Blood Glucose: No results found for this or any previous visit (from the past 24 hour(s)).  Exercise Prescription Changes - 08/08/18 1500      Response to Exercise   Blood Pressure (Admit)  114/70    Blood Pressure (Exercise)  144/80    Blood Pressure (Exit)  106/70    Heart Rate (Admit)  89 bpm    Heart Rate (Exercise)  122 bpm    Heart Rate (Exit)  93 bpm    Oxygen Saturation (Admit)  96 %    Oxygen Saturation (Exercise)  97 %    Oxygen Saturation (Exit)  95 %    Rating of Perceived Exertion (Exercise)  12    Perceived Dyspnea (Exercise)  1    Duration  Continue with 45 min of aerobic exercise without signs/symptoms of physical distress.    Intensity  THRR unchanged      Progression   Progression  Continue to progress workloads to maintain intensity without signs/symptoms of physical distress.      Resistance Training   Training Prescription  Yes    Weight  orange bands    Reps  10-15    Time  10 Minutes      NuStep   Level  3    SPM  80    Minutes  17    METs  2.9      Arm Ergometer   Level  2    Watts  10    Minutes  17      Track   Laps  21    Minutes  17       Social History   Tobacco Use  Smoking Status Former Smoker  . Packs/day: 0.50  . Years: 40.00  . Pack years: 20.00  . Types: Cigarettes  . Last attempt to quit: 03/17/2013  . Years since quitting: 5.3  Smokeless Tobacco Never Used    Goals Met:  Proper associated with RPD/PD & O2 Sat Exercise tolerated well Strength training completed today  Goals Unmet:  Not Applicable  Comments: Service time is from 1330 to 1510    Dr. Rush Farmer is Medical Director for Pulmonary Rehab at [facility].

## 2018-08-10 ENCOUNTER — Encounter (HOSPITAL_COMMUNITY)
Admission: RE | Admit: 2018-08-10 | Discharge: 2018-08-10 | Disposition: A | Discharge status: Home / Self Care | Payer: Managed Care, Other (non HMO) | Source: Ambulatory Visit | Attending: Emergency Medicine | Admitting: Emergency Medicine

## 2018-08-10 DIAGNOSIS — J449 Chronic obstructive pulmonary disease, unspecified: Secondary | ICD-10-CM

## 2018-08-10 NOTE — Progress Notes (Signed)
Pulmonary Individual Treatment Plan  Patient Details  Name: [name] [name] MRN: [id number] Date of Birth: 1955-08-11 Referring Provider:     Pulmonary Rehab Walk Test from 07/11/2018 in [facility]  Referring Provider  Dr. Lamonte Sakai      Initial Encounter Date:    Pulmonary Rehab Walk Test from 07/11/2018 in [facility]  Date  07/13/18      Visit Diagnosis: Chronic obstructive pulmonary disease, unspecified COPD type ([place])  Patient's Home Medications on Admission:   Current Outpatient Medications:  .  albuterol (PROAIR HFA) 108 (90 Base) MCG/ACT inhaler, Inhale 1 puff into the lungs 3 (three) times daily as needed for wheezing or shortness of breath., Disp: 1 Inhaler, Rfl: 2 .  aspirin EC 81 MG tablet, Take 1 tablet (81 mg total) by mouth daily., Disp: 90 tablet, Rfl: 3 .  rosuvastatin (CRESTOR) 10 MG tablet, Take 1 tablet (10 mg total) by mouth daily., Disp: 90 tablet, Rfl: 0 .  verapamil (CALAN-SR) 240 MG CR tablet, Take 1 tablet (240 mg total) by mouth at bedtime., Disp: 90 tablet, Rfl: 0 .  Vitamin D, Ergocalciferol, (DRISDOL) 50000 units CAPS capsule, Take 50,000 Units by mouth every 7 (seven) days., Disp: , Rfl:   Current Facility-Administered Medications:  .  Ampicillin-Sulbactam (UNASYN) 3 g in sodium chloride 0.9 % 100 mL IVPB, 3 g, Intravenous, Once, Avelina Laine, PA-C  Past Medical History: Past Medical History:  Diagnosis Date  . Anxiety    husband also cancer pt  . Cancer ([place])    lung  . Complication of anesthesia    bronchospasms post-op (pulmonary did consult but pt was discharged later that day)  With last surg. she was given albuterol inhaler & had steroid with surg.    . Congenital renal atrophy    left kidney, one spontaneous stone passed   . COPD (chronic obstructive pulmonary disease) ([place])   . Coronary artery disease   . Degenerative arthritis    spine, hands & knees   . GERD  (gastroesophageal reflux disease)   . History of migraine   . Hypertension    h/o PVC- asymptomatic   . Memory disorder 03/29/2017  . 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 PONV (postoperative nausea and vomiting)   . PVC (premature ventricular contraction)   . Shortness of breath dyspnea    with exertion  . Thyroid nodule    benign by biopsy  . Varicose veins    left leg  . Visual disturbance    Episodic  . Vitamin D deficiency   . Wears glasses     Tobacco Use: Social History   Tobacco Use  Smoking Status Former Smoker  . Packs/day: 0.50  . Years: 40.00  . Pack years: 20.00  . Types: Cigarettes  . Last attempt to quit: 03/17/2013  . Years since quitting: 5.4  Smokeless Tobacco Never Used    Labs: Recent Chemical engineer    Labs for ITP Cardiac and Pulmonary Rehab Latest Ref Rng & Units 07/19/2014 07/24/2014 10/11/2017   Cholestrol 100 - 199 mg/dL - - 139   LDLCALC 0 - 99 mg/dL - - 55   HDL >39 mg/dL - - 73   Trlycerides 0 - 149 mg/dL - - 54   PHART 7.350 - 7.450 7.458(H) 7.424 -   PCO2ART 35.0 - 45.0 mmHg 31.2(L) 37.7 -  HCO3 20.0 - 24.0 mEq/L 21.8 24.7(H) -   TCO2 0 - 100 mmol/L 22.7 26 -   ACIDBASEDEF 0.0 - 2.0 mmol/L 1.5 - -   O2SAT % 99.7 97.0 -      Capillary Blood Glucose: Lab Results  Component Value Date   GLUCAP 119 (H) 04/23/2014     Pulmonary Assessment Scores: Pulmonary Assessment Scores    Row Name 07/10/18 1111 07/11/18 1603       ADL UCSD   ADL Phase  Entry  -    SOB Score total  30.5  -      CAT Score   CAT Score  13  -      mMRC Score   mMRC Score  -  1       Pulmonary Function Assessment: Pulmonary Function Assessment - 07/10/18 1015      Breath   Bilateral Breath Sounds  Decreased   absent RLL d/t previous lobectomy   Shortness of Breath  Yes;Limiting activity       Exercise Target Goals: Exercise Program Goal: Individual exercise  prescription set using results from initial 6 min walk test and THRR while considering  patient's activity barriers and safety.   Exercise Prescription Goal: Initial exercise prescription builds to 30-45 minutes a day of aerobic activity, 2-3 days per week.  Home exercise guidelines will be given to patient during program as part of exercise prescription that the participant will acknowledge.  Activity Barriers & Risk Stratification: Activity Barriers & Cardiac Risk Stratification - 07/10/18 1006      Activity Barriers & Cardiac Risk Stratification   Activity Barriers  Arthritis;Back Problems;Neck/Spine Problems;Shortness of Breath;Muscular Weakness;Balance Concerns       6 Minute Walk: 6 Minute Walk    Row Name 07/11/18 1603         6 Minute Walk   Distance  1800 feet     Walk Time  6 minutes     # of Rest Breaks  0     MPH  3.41     METS  3.6     RPE  12     Perceived Dyspnea   2     VO2 Peak  15.28 relative     Symptoms  No     Resting HR  83 bpm     Resting BP  124/76     Resting Oxygen Saturation   97 %     Exercise Oxygen Saturation  during 6 min walk  92 %     Max Ex. HR  125 bpm     Max Ex. BP  140/80     2 Minute Post BP  124/78       Interval HR   1 Minute HR  101     2 Minute HR  113     3 Minute HR  120     4 Minute HR  125     5 Minute HR  121     6 Minute HR  120     2 Minute Post HR  84     Interval Heart Rate?  Yes       Interval Oxygen   Interval Oxygen?  Yes     Baseline Oxygen Saturation %  97 %     1 Minute Oxygen Saturation %  95 %     1 Minute Liters of Oxygen  0 L     2 Minute Oxygen Saturation %  93 %  2 Minute Liters of Oxygen  0 L     3 Minute Oxygen Saturation %  92 %     3 Minute Liters of Oxygen  0 L     4 Minute Oxygen Saturation %  92 %     4 Minute Liters of Oxygen  0 L     5 Minute Oxygen Saturation %  93 %     5 Minute Liters of Oxygen  0 L     6 Minute Oxygen Saturation %  94 %     6 Minute Liters of Oxygen  0 L     2  Minute Post Oxygen Saturation %  97 %     2 Minute Post Liters of Oxygen  0 L        Oxygen Initial Assessment: Oxygen Initial Assessment - 07/11/18 1603      Home Oxygen   Home Oxygen Device  None    Sleep Oxygen Prescription  None    Home Exercise Oxygen Prescription  None    Home at Rest Exercise Oxygen Prescription  None      Initial 6 min Walk   Oxygen Used  None      Program Oxygen Prescription   Program Oxygen Prescription  None      Intervention   Short Term Goals  To learn and understand importance of monitoring SPO2 with pulse oximeter and demonstrate accurate use of the pulse oximeter.;To learn and demonstrate proper pursed lip breathing techniques or other breathing techniques.;To learn and understand importance of maintaining oxygen saturations>88%;To learn and demonstrate proper use of respiratory medications    Long  Term Goals  Maintenance of O2 saturations>88%;Verbalizes importance of monitoring SPO2 with pulse oximeter and return demonstration;Exhibits proper breathing techniques, such as pursed lip breathing or other method taught during program session;Compliance with respiratory medication;Demonstrates proper use of MDI's       Oxygen Re-Evaluation: Oxygen Re-Evaluation    Row Name 08/08/18 0733             Program Oxygen Prescription   Program Oxygen Prescription  None         Home Oxygen   Home Oxygen Device  None       Sleep Oxygen Prescription  None       Home Exercise Oxygen Prescription  None       Home at Rest Exercise Oxygen Prescription  None          Oxygen Discharge (Final Oxygen Re-Evaluation): Oxygen Re-Evaluation - 08/08/18 0733      Program Oxygen Prescription   Program Oxygen Prescription  None      Home Oxygen   Home Oxygen Device  None    Sleep Oxygen Prescription  None    Home Exercise Oxygen Prescription  None    Home at Rest Exercise Oxygen Prescription  None       Initial Exercise Prescription: Initial Exercise  Prescription - 07/13/18 0700      Date of Initial Exercise RX and Referring Provider   Date  07/13/18    Referring Provider  Dr. Lamonte Sakai      NuStep   Level  2    SPM  80    Minutes  17    METs  1.5      Arm Ergometer   Level  1    Watts  10    Minutes  17      Track   Laps  8  Minutes  17      Prescription Details   Frequency (times per week)  2    Duration  Progress to 45 minutes of aerobic exercise without signs/symptoms of physical distress      Intensity   THRR 40-80% of Max Heartrate  62-125    Ratings of Perceived Exertion  11-13    Perceived Dyspnea  0-4      Progression   Progression  Continue to progress workloads to maintain intensity without signs/symptoms of physical distress.      Resistance Training   Training Prescription  Yes    Weight  orange bands    Reps  10-15       Perform Capillary Blood Glucose checks as needed.  Exercise Prescription Changes: Exercise Prescription Changes    Row Name 07/25/18 1600 08/08/18 1500           Response to Exercise   Blood Pressure (Admit)  122/78  114/70      Blood Pressure (Exercise)  130/70  144/80      Blood Pressure (Exit)  134/80  106/70      Heart Rate (Admit)  79 bpm  89 bpm      Heart Rate (Exercise)  119 bpm  122 bpm      Heart Rate (Exit)  90 bpm  93 bpm      Oxygen Saturation (Admit)  97 %  96 %      Oxygen Saturation (Exercise)  95 %  97 %      Oxygen Saturation (Exit)  96 %  95 %      Rating of Perceived Exertion (Exercise)  12  12      Perceived Dyspnea (Exercise)  1.5  1      Duration  Continue with 45 min of aerobic exercise without signs/symptoms of physical distress.  Continue with 45 min of aerobic exercise without signs/symptoms of physical distress.      Intensity  Other (comment) 40-80% of HRR  THRR unchanged        Progression   Progression  Continue to progress workloads to maintain intensity without signs/symptoms of physical distress.  Continue to progress workloads to  maintain intensity without signs/symptoms of physical distress.        Resistance Training   Training Prescription  Yes  Yes      Weight  orange bands  orange bands      Reps  10-15  10-15      Time  10 Minutes  10 Minutes        NuStep   Level  3  3      SPM  80  80      Minutes  17  17      METs  2.4  2.9        Arm Ergometer   Level  1  2      Watts  10  10      Minutes  17  17        Track   Laps  21  21      Minutes  17  17         Exercise Comments:   Exercise Goals and Review: Exercise Goals    Row Name 07/10/18 1008             Exercise Goals   Increase Physical Activity  Yes       Intervention  Provide advice, education, support and  counseling about physical activity/exercise needs.;Develop an individualized exercise prescription for aerobic and resistive training based on initial evaluation findings, risk stratification, comorbidities and participant's personal goals.       Expected Outcomes  Short Term: Attend rehab on a regular basis to increase amount of physical activity.       Increase Strength and Stamina  Yes       Intervention  Provide advice, education, support and counseling about physical activity/exercise needs.;Develop an individualized exercise prescription for aerobic and resistive training based on initial evaluation findings, risk stratification, comorbidities and participant's personal goals.       Expected Outcomes  Short Term: Increase workloads from initial exercise prescription for resistance, speed, and METs.       Able to understand and use rate of perceived exertion (RPE) scale  Yes       Intervention  Provide education and explanation on how to use RPE scale       Expected Outcomes  Short Term: Able to use RPE daily in rehab to express subjective intensity level;Long Term:  Able to use RPE to guide intensity level when exercising independently       Able to understand and use Dyspnea scale  Yes       Intervention  Provide education  and explanation on how to use Dyspnea scale       Expected Outcomes  Short Term: Able to use Dyspnea scale daily in rehab to express subjective sense of shortness of breath during exertion;Long Term: Able to use Dyspnea scale to guide intensity level when exercising independently       Knowledge and understanding of Target Heart Rate Range (THRR)  Yes       Intervention  Provide education and explanation of THRR including how the numbers were predicted and where they are located for reference       Expected Outcomes  Short Term: Able to state/look up THRR;Long Term: Able to use THRR to govern intensity when exercising independently;Short Term: Able to use daily as guideline for intensity in rehab       Understanding of Exercise Prescription  Yes       Intervention  Provide education, explanation, and written materials on patient's individual exercise prescription       Expected Outcomes  Short Term: Able to explain program exercise prescription;Long Term: Able to explain home exercise prescription to exercise independently          Exercise Goals Re-Evaluation : Exercise Goals Re-Evaluation    Row Name 08/08/18 0731             Exercise Goal Re-Evaluation   Exercise Goals Review  Increase Physical Activity;Increase Strength and Stamina;Able to understand and use rate of perceived exertion (RPE) scale;Able to understand and use Dyspnea scale;Knowledge and understanding of Target Heart Rate Range (THRR);Understanding of Exercise Prescription       Comments  Patient is progressing well in rehab. She is able to walk 23 laps (242f) in 15 minutes. MET average places her in a moderate level. We have discussed home exercise. [facility] States her biggest barrier is her diet. We have discusses this at length and she will meet with the dietician. Will cont. to monitor patient and progress as able.       Expected  Outcomes  Through exercise at rehab and at home, the patient will decrease shortness of breath with daily activities and  feel confident in carrying out an exercise regime at home.           Discharge Exercise Prescription (Final Exercise Prescription Changes): Exercise Prescription Changes - 08/08/18 1500      Response to Exercise   Blood Pressure (Admit)  114/70    Blood Pressure (Exercise)  144/80    Blood Pressure (Exit)  106/70    Heart Rate (Admit)  89 bpm    Heart Rate (Exercise)  122 bpm    Heart Rate (Exit)  93 bpm    Oxygen Saturation (Admit)  96 %    Oxygen Saturation (Exercise)  97 %    Oxygen Saturation (Exit)  95 %    Rating of Perceived Exertion (Exercise)  12    Perceived Dyspnea (Exercise)  1    Duration  Continue with 45 min of aerobic exercise without signs/symptoms of physical distress.    Intensity  THRR unchanged      Progression   Progression  Continue to progress workloads to maintain intensity without signs/symptoms of physical distress.      Resistance Training   Training Prescription  Yes    Weight  orange bands    Reps  10-15    Time  10 Minutes      NuStep   Level  3    SPM  80    Minutes  17    METs  2.9      Arm Ergometer   Level  2    Watts  10    Minutes  17      Track   Laps  21    Minutes  17       Nutrition:  Target Goals: Understanding of nutrition guidelines, daily intake of sodium '1500mg'$ , cholesterol '200mg'$ , calories 30% from fat and 7% or less from saturated fats, daily to have 5 or more servings of fruits and vegetables.  Biometrics: Pre Biometrics - 07/10/18 1011      Pre Biometrics   Grip Strength  28 kg        Nutrition Therapy Plan and Nutrition Goals: Nutrition Therapy & Goals - 07/21/18 1040      Nutrition Therapy   Diet  general healthful      Personal Nutrition Goals   Nutrition Goal  Identify food quantities necessary to achieve wt loss of  -2# per week to a goal wt loss of 2.7-10.9 kg (6-24 lb) at  graduation from pulmonary rehab.    Personal Goal #2  Pt to build a healthy plate including fruits, vegetables, whole grains, and low-fat dairy products in a healthy meal plan.    Personal Goal #3  Weigh and measure portions for accuracy      Intervention Plan   Intervention  Prescribe, educate and counsel regarding individualized specific dietary modifications aiming towards targeted core components such as weight, hypertension, lipid management, diabetes, heart failure and other comorbidities.    Expected Outcomes  Short Term Goal: Understand basic principles of dietary content, such as calories, fat, sodium, cholesterol and nutrients.;Long Term Goal: Adherence to prescribed nutrition plan.       Nutrition Assessments: Nutrition Assessments - 07/21/18 1043      Rate Your Plate Scores   Pre Score  44       Nutrition Goals Re-Evaluation: Nutrition Goals Re-Evaluation    Row Name 07/21/18 1040             Goals   Current Weight  171 lb 4.8 oz (  77.7 kg)          Nutrition Goals Discharge (Final Nutrition Goals Re-Evaluation): Nutrition Goals Re-Evaluation - 07/21/18 1040      Goals   Current Weight  171 lb 4.8 oz (77.7 kg)       Psychosocial: Target Goals: Acknowledge presence or absence of significant depression and/or stress, maximize coping skills, provide positive support system. Participant is able to verbalize types and ability to use techniques and skills needed for reducing stress and depression.  Initial Review & Psychosocial Screening: Initial Psych Review & Screening - 08/07/18 1122      Initial Review   Current issues with  Current Sleep Concerns;Current Stress Concerns    Source of Stress Concerns  Family;Financial    Comments  Seems to be enjoying the pulmonary rehab program, seems upbeat and positive.      Family Dynamics   Good Support System?  Yes    Concerns  Recent loss of significant other      Barriers   Psychosocial barriers to participate in  program  The patient should benefit from training in stress management and relaxation.       Quality of Life Scores:  Scores of 19 and below usually indicate a poorer quality of life in these areas.  A difference of  2-3 points is a clinically meaningful difference.  A difference of 2-3 points in the total score of the Quality of Life Index has been associated with significant improvement in overall quality of life, self-image, physical symptoms, and general health in studies assessing change in quality of life.  PHQ-9: Recent Review Flowsheet Data    Depression screen [facility] 2/9 07/10/2018 06/05/2014 05/23/2014 04/26/2014   Decreased Interest 0 0 0 -   Down, Depressed, Hopeless 0 0 0 0   PHQ - 2 Score 0 0 0 0   Altered sleeping 0 - - -   Tired, decreased energy 1 - - -   Change in appetite 0 - - -   Feeling bad or failure about yourself  0 - - -   Trouble concentrating 0 - - -   Moving slowly or fidgety/restless 0 - - -   Suicidal thoughts 0 - - -   PHQ-9 Score 1 - - -   Difficult doing work/chores Not difficult at all - - -     Interpretation of Total Score  Total Score Depression Severity:  1-4 = Minimal depression, 5-9 = Mild depression, 10-14 = Moderate depression, 15-19 = Moderately severe depression, 20-27 = Severe depression   Psychosocial Evaluation and Intervention: Psychosocial Evaluation - 07/10/18 1132      Psychosocial Evaluation & Interventions   Comments  pt lost husband a year and a half ago and states she has had stress finding the new normal with her grown daughter. Daughter is Education officer, museum and is looking to move to [place], which will be another change for her. Pt states she does have a group of friends that support her and she is able to get out with.    Expected Outcomes  pt will maintain a posive and healthy outlook while maintaining positive coping skills    Continue Psychosocial Services   Follow up required by staff       Psychosocial  Re-Evaluation: Psychosocial Re-Evaluation    [place] Name 08/07/18 1124             Psychosocial Re-Evaluation   Current issues with  Current Stress Concerns  Comments  Recent loss of husband, using healthy coping mechanisms        Expected Outcomes  continued use of healthy coping mechanisms       Interventions  Stress management education;Encouraged to attend Pulmonary Rehabilitation for the exercise;Relaxation education       Continue Psychosocial Services   Follow up required by staff       Comments  Stress and relaxation classes to assist in stress management         Initial Review   Source of Stress Concerns  Family;Financial          Psychosocial Discharge (Final Psychosocial Re-Evaluation): Psychosocial Re-Evaluation - 08/07/18 1124      Psychosocial Re-Evaluation   Current issues with  Current Stress Concerns    Comments  Recent loss of husband, using healthy coping mechanisms     Expected Outcomes  continued use of healthy coping mechanisms    Interventions  Stress management education;Encouraged to attend Pulmonary Rehabilitation for the exercise;Relaxation education    Continue Psychosocial Services   Follow up required by staff    Comments  Stress and relaxation classes to assist in stress management      Initial Review   Source of Stress Concerns  Family;Financial       Education: Education Goals: Education classes will be provided on a weekly basis, covering required topics. Participant will state understanding/return demonstration of topics presented.  Learning Barriers/Preferences: Learning Barriers/Preferences - 07/10/18 1020      Learning Barriers/Preferences   Learning Barriers  Sight    Learning Preferences  Skilled Demonstration;Group Instruction;Individual Instruction;Audio       Education Topics: Risk Factor Reduction:  -Group instruction that is supported by a PowerPoint presentation. Instructor discusses the definition of a risk factor,  different risk factors for pulmonary disease, and how the heart and lungs work together.     Nutrition for Pulmonary Patient:  -Group instruction provided by PowerPoint slides, verbal discussion, and written materials to support subject matter. The instructor gives an explanation and review of healthy diet recommendations, which includes a discussion on weight management, recommendations for fruit and vegetable consumption, as well as protein, fluid, caffeine, fiber, sodium, sugar, and alcohol. Tips for eating when patients are short of breath are discussed.   Pursed Lip Breathing:  -Group instruction that is supported by demonstration and informational handouts. Instructor discusses the benefits of pursed lip and diaphragmatic breathing and detailed demonstration on how to preform both.     Oxygen Safety:  -Group instruction provided by PowerPoint, verbal discussion, and written material to support subject matter. There is an overview of "What is Oxygen" and "Why do we need it".  Instructor also reviews how to create a safe environment for oxygen use, the importance of using oxygen as prescribed, and the risks of noncompliance. There is a brief discussion on traveling with oxygen and resources the patient may utilize.   Oxygen Equipment:  -Group instruction provided by [facility] Staff utilizing handouts, written materials, and equipment demonstrations.   PULMONARY REHAB OTHER RESPIRATORY from 08/03/2018 in [place]  Date  07/20/18  Educator  Ace Gins Rep  Instruction Review Code  1- Verbalizes Understanding      Signs and Symptoms:  -Group instruction provided by written material and verbal discussion to support subject matter. Warning signs and symptoms of infection, stroke, and heart attack are reviewed and when to call the physician/911 reinforced. Tips for preventing the spread of infection discussed.  PULMONARY REHAB OTHER RESPIRATORY from 08/03/2018  in [place]  Date  08/03/18  Educator  Remo Lipps  Instruction Review Code  1- Verbalizes Understanding      Advanced Directives:  -Group instruction provided by verbal instruction and written material to support subject matter. Instructor reviews Advanced Directive laws and proper instruction for filling out document.   Pulmonary Video:  -Group video education that reviews the importance of medication and oxygen compliance, exercise, good nutrition, pulmonary hygiene, and pursed lip and diaphragmatic breathing for the pulmonary patient.   Exercise for the Pulmonary Patient:  -Group instruction that is supported by a PowerPoint presentation. Instructor discusses benefits of exercise, core components of exercise, frequency, duration, and intensity of an exercise routine, importance of utilizing pulse oximetry during exercise, safety while exercising, and options of places to exercise outside of rehab.     PULMONARY REHAB OTHER RESPIRATORY from 08/03/2018 in [place]  Date  07/27/18  Educator  Cloyde Reams  Instruction Review Code  1- Verbalizes Understanding      Pulmonary Medications:  -Verbally interactive group education provided by instructor with focus on inhaled medications and proper administration.   Anatomy and Physiology of the Respiratory System and Intimacy:  -Group instruction provided by PowerPoint, verbal discussion, and written material to support subject matter. Instructor reviews respiratory cycle and anatomical components of the respiratory system and their functions. Instructor also reviews differences in obstructive and restrictive respiratory diseases with examples of each. Intimacy, Sex, and Sexuality differences are reviewed with a discussion on how relationships can change when diagnosed with pulmonary disease. Common sexual concerns are reviewed.   MD DAY -A group question and answer session with a medical  doctor that allows participants to ask questions that relate to their pulmonary disease state.   OTHER EDUCATION -Group or individual verbal, written, or video instructions that support the educational goals of the pulmonary rehab program.   Holiday Eating Survival Tips:  -Group instruction provided by PowerPoint slides, verbal discussion, and written materials to support subject matter. The instructor gives patients tips, tricks, and techniques to help them not only survive but enjoy the holidays despite the onslaught of food that accompanies the holidays.   Knowledge Questionnaire Score: Knowledge Questionnaire Score - 07/10/18 1122      Knowledge Questionnaire Score   Pre Score  15/18       Core Components/Risk Factors/Patient Goals at Admission: Personal Goals and Risk Factors at Admission - 07/10/18 1020      Core Components/Risk Factors/Patient Goals on Admission    Weight Management  Weight Loss;Yes    Intervention  Weight Management: Develop a combined nutrition and exercise program designed to reach desired caloric intake, while maintaining appropriate intake of nutrient and fiber, sodium and fats, and appropriate energy expenditure required for the weight goal.    Admit Weight  171 lb 4.8 oz (77.7 kg)    Goal Weight: Short Term  159 lb (72.1 kg)    Goal Weight: Long Term  130 lb (59 kg)    Expected Outcomes  Weight Loss: Understanding of general recommendations for a balanced deficit meal plan, which promotes 1-2 lb weight loss per week and includes a negative energy balance of [phone] kcal/d;Short Term: Continue to assess and modify interventions until short term weight is achieved;Long Term: Adherence to nutrition and physical activity/exercise program aimed toward attainment of established weight goal;Understanding of distribution of calorie intake throughout the day with the consumption of 4-5  meals/snacks;Understanding recommendations for meals to include 15-35% energy as  protein, 25-35% energy from fat, 35-60% energy from carbohydrates, less than '200mg'$  of dietary cholesterol, 20-35 gm of total fiber daily    Improve shortness of breath with ADL's  Yes    Intervention  Provide education, individualized exercise plan and daily activity instruction to help decrease symptoms of SOB with activities of daily living.    Expected Outcomes  Short Term: Improve cardiorespiratory fitness to achieve a reduction of symptoms when performing ADLs    Hypertension  Yes    Intervention  Provide education on lifestyle modifcations including regular physical activity/exercise, weight management, moderate sodium restriction and increased consumption of fresh fruit, vegetables, and low fat dairy, alcohol moderation, and smoking cessation.;Monitor prescription use compliance.    Expected Outcomes  Short Term: Continued assessment and intervention until BP is < 140/53m HG in hypertensive participants. < 130/841mHG in hypertensive participants with diabetes, heart failure or chronic kidney disease.;Long Term: Maintenance of blood pressure at goal levels.    Stress  Yes    Intervention  Offer individual and/or small group education and counseling on adjustment to heart disease, stress management and health-related lifestyle change. Teach and support self-help strategies.;Refer participants experiencing significant psychosocial distress to appropriate mental health specialists for further evaluation and treatment. When possible, include family members and significant others in education/counseling sessions.    Expected Outcomes  Short Term: Participant demonstrates changes in health-related behavior, relaxation and other stress management skills, ability to obtain effective social support, and compliance with psychotropic medications if prescribed.;Long Term: Emotional wellbeing is indicated by absence of clinically significant psychosocial distress or social isolation.       Core  Components/Risk Factors/Patient Goals Review:  Goals and Risk Factor Review    Row Name 07/10/18 1023 08/07/18 1128           Core Components/Risk Factors/Patient Goals Review   Personal Goals Review  Weight Management/Obesity;Improve shortness of breath with ADL's;Increase knowledge of respiratory medications and ability to use respiratory devices properly.;Develop more efficient breathing techniques such as purse lipped breathing and diaphragmatic breathing and practicing self-pacing with activity.;Hypertension;Stress  Weight Management/Obesity;Improve shortness of breath with ADL's;Increase knowledge of respiratory medications and ability to use respiratory devices properly.;Stress;Develop more efficient breathing techniques such as purse lipped breathing and diaphragmatic breathing and practicing self-pacing with activity.      Review  -  has attended 6 exercise sessions, no weight loss as of yet, workloads are increasing on the nustep, arm ergomenter, and walking track      Expected Outcomes  -  See admission goals         Core Components/Risk Factors/Patient Goals at Discharge (Final Review):  Goals and Risk Factor Review - 08/07/18 1128      Core Components/Risk Factors/Patient Goals Review   Personal Goals Review  Weight Management/Obesity;Improve shortness of breath with ADL's;Increase knowledge of respiratory medications and ability to use respiratory devices properly.;Stress;Develop more efficient breathing techniques such as purse lipped breathing and diaphragmatic breathing and practicing self-pacing with activity.    Review  has attended 6 exercise sessions, no weight loss as of yet, workloads are increasing on the nustep, arm ergomenter, and walking track    Expected Outcomes  See admission goals       ITP Comments: ITP Comments    Row Name 07/10/18 0951           ITP Comments  Dr. WeJennet MaduroMedical Director, Pulmonary Director  Comments: ITP REVIEW Pt  is making expected progress toward pulmonary rehab goals after completing 7 sessions. Recommend continued exercise, life style modification, education, and utilization of breathing techniques to increase stamina and strength and decrease shortness of breath with exertion.

## 2018-08-10 NOTE — Progress Notes (Signed)
Daily Session Note  Patient Details  Name: [name] [name] MRN: [id number] Date of Birth: 1956/01/09 Referring Provider:     Pulmonary Rehab Walk Test from 07/11/2018 in [place]  Referring Provider  Dr. Lamonte Sakai      Encounter Date: 08/10/2018  Check In: Session Check In - 08/10/18 1330      Check-In   Supervising physician immediately available to respond to emergencies  [facility] immediately available    Physician(s)  Dr. Wynelle Cleveland    Location  [facility]    Staff Present  Joycelyn Man RN, BSN;Ambriella Kitt, MS, ACSM RCEP, Exercise Physiologist;Dalton Kris Mouton, MS, Exercise Physiologist;Joan Leonia Reeves, RN, Roque Cash, RN    Medication changes reported      No    Fall or balance concerns reported     No    Tobacco Cessation  No Change    Warm-up and Cool-down  Performed as group-led instruction    Resistance Training Performed  Yes    VAD Patient?  No    PAD/SET Patient?  No      Pain Assessment   Currently in Pain?  No/denies    Multiple Pain Sites  No       Capillary Blood Glucose: No results found for this or any previous visit (from the past 24 hour(s)).    Social History   Tobacco Use  Smoking Status Former Smoker  . Packs/day: 0.50  . Years: 40.00  . Pack years: 20.00  . Types: Cigarettes  . Last attempt to quit: 03/17/2013  . Years since quitting: 5.4  Smokeless Tobacco Never Used    Goals Met:  Exercise tolerated well  Goals Unmet:  Not Applicable  Comments: Service time is from 1:30p to 12:00p    Dr. Rush Farmer is Medical Director for Pulmonary Rehab at [facility].

## 2018-08-15 ENCOUNTER — Encounter (HOSPITAL_COMMUNITY)
Admission: RE | Admit: 2018-08-15 | Discharge: 2018-08-15 | Disposition: A | Discharge status: Home / Self Care | Payer: Managed Care, Other (non HMO) | Source: Ambulatory Visit | Attending: Emergency Medicine | Admitting: Emergency Medicine

## 2018-08-15 DIAGNOSIS — J449 Chronic obstructive pulmonary disease, unspecified: Secondary | ICD-10-CM | POA: Diagnosis not present

## 2018-08-15 NOTE — Progress Notes (Signed)
Daily Session Note  Patient Details  Name: [name] [name] MRN: [id number] Date of Birth: 1955/12/30 Referring Provider:     Pulmonary Rehab Walk Test from 07/11/2018 in [place]  Referring Provider  Dr. Lamonte Sakai      Encounter Date: 08/15/2018  Check In: Session Check In - 08/15/18 1346      Check-In   Supervising physician immediately available to respond to emergencies  [facility] immediately available    Physician(s)  Dr. Jonnie Finner    Staff Present  Hoy Register, MS, Exercise Physiologist;Lisa Ysidro Evert, RN;Azaryah Oleksy Leonia Reeves, RN, BSN;Molly DiVincenzo, MS, ACSM RCEP, Exercise Physiologist    Medication changes reported      No    Fall or balance concerns reported     No    Tobacco Cessation  No Change    Warm-up and Cool-down  Performed as group-led instruction    Resistance Training Performed  Yes    VAD Patient?  No    PAD/SET Patient?  No      Pain Assessment   Currently in Pain?  No/denies    Pain Score  0-No pain    Multiple Pain Sites  No       Capillary Blood Glucose: No results found for this or any previous visit (from the past 24 hour(s)).    Social History   Tobacco Use  Smoking Status Former Smoker  . Packs/day: 0.50  . Years: 40.00  . Pack years: 20.00  . Types: Cigarettes  . Last attempt to quit: 03/17/2013  . Years since quitting: 5.4  Smokeless Tobacco Never Used    Goals Met:  Proper associated with RPD/PD & O2 Sat Exercise tolerated well Strength training completed today  Goals Unmet:  Not Applicable  Comments: Service time is from 1330 to 1515.    Dr. Rush Farmer is Medical Director for Pulmonary Rehab at [facility].

## 2018-08-17 ENCOUNTER — Encounter (HOSPITAL_COMMUNITY)
Admission: RE | Admit: 2018-08-17 | Discharge: 2018-08-17 | Disposition: A | Discharge status: Home / Self Care | Payer: Managed Care, Other (non HMO) | Source: Ambulatory Visit | Attending: Emergency Medicine | Admitting: Emergency Medicine

## 2018-08-17 ENCOUNTER — Other Ambulatory Visit: Payer: Self-pay

## 2018-08-17 VITALS — Wt 169.5 lb

## 2018-08-17 DIAGNOSIS — J449 Chronic obstructive pulmonary disease, unspecified: Secondary | ICD-10-CM | POA: Diagnosis not present

## 2018-08-17 NOTE — Progress Notes (Signed)
Daily Session Note  Patient Details  Name: [name] [name] MRN: [id number] Date of Birth: [birth year] Referring Provider:     Pulmonary Rehab Walk Test from 07/11/2018 in [place]  Referring Provider  Dr. Lamonte Sakai      Encounter Date: 08/17/2018  Check In: Session Check In - 08/17/18 [place]      Check-In   Supervising physician immediately available to respond to emergencies  [facility] immediately available    Physician(s)  Dr. Cathlean Sauer    Location  [facility]    Staff Present  Joycelyn Man RN, BSN;Lisa Ysidro Evert, RN;Dalton Kris Mouton, MS, Exercise Physiologist;Zaela Graley, MS, ACSM RCEP, Exercise Physiologist    Medication changes reported      No    Fall or balance concerns reported     No    Tobacco Cessation  No Change    Warm-up and Cool-down  Performed as group-led instruction    Resistance Training Performed  Yes    VAD Patient?  No    PAD/SET Patient?  No      Pain Assessment   Currently in Pain?  No/denies    Pain Score  0-No pain    Multiple Pain Sites  No       Capillary Blood Glucose: No results found for this or any previous visit (from the past 24 hour(s)).    Social History   Tobacco Use  Smoking Status Former Smoker  . Packs/day: 0.50  . Years: 40.00  . Pack years: 20.00  . Types: Cigarettes  . Last attempt to quit: 03/17/2013  . Years since quitting: 5.4  Smokeless Tobacco Never Used    Goals Met:  Exercise tolerated well  Goals Unmet:  Not Applicable  Comments: Service time is from 1:30p to 3:15p    Dr. Rush Farmer is Medical Director for Pulmonary Rehab at [facility].

## 2018-08-22 ENCOUNTER — Encounter (HOSPITAL_COMMUNITY): Payer: Managed Care, Other (non HMO)

## 2018-08-24 ENCOUNTER — Encounter (HOSPITAL_COMMUNITY): Payer: Managed Care, Other (non HMO)

## 2018-08-28 ENCOUNTER — Telehealth (HOSPITAL_COMMUNITY): Payer: Self-pay

## 2018-08-29 ENCOUNTER — Encounter (HOSPITAL_COMMUNITY): Payer: Managed Care, Other (non HMO)

## 2018-08-29 NOTE — Progress Notes (Signed)
Pulmonary Individual Treatment Plan  Patient Details  Name: [name] [name] MRN: [id number] Date of Birth: 1955-08-11 Referring Provider:     Pulmonary Rehab Walk Test from 07/11/2018 in [place]  Referring Provider  Dr. Lamonte Sakai      Initial Encounter Date:    Pulmonary Rehab Walk Test from 07/11/2018 in [place]  Date  07/13/18      Visit Diagnosis: Chronic obstructive pulmonary disease, unspecified COPD type ([place])  Patient's Home Medications on Admission:   Current Outpatient Medications:  .  albuterol (PROAIR HFA) 108 (90 Base) MCG/ACT inhaler, Inhale 1 puff into the lungs 3 (three) times daily as needed for wheezing or shortness of breath., Disp: 1 Inhaler, Rfl: 2 .  aspirin EC 81 MG tablet, Take 1 tablet (81 mg total) by mouth daily., Disp: 90 tablet, Rfl: 3 .  rosuvastatin (CRESTOR) 10 MG tablet, Take 1 tablet (10 mg total) by mouth daily., Disp: 90 tablet, Rfl: 0 .  verapamil (CALAN-SR) 240 MG CR tablet, Take 1 tablet (240 mg total) by mouth at bedtime., Disp: 90 tablet, Rfl: 0 .  Vitamin D, Ergocalciferol, (DRISDOL) 50000 units CAPS capsule, Take 50,000 Units by mouth every 7 (seven) days., Disp: , Rfl:   Current Facility-Administered Medications:  .  Ampicillin-Sulbactam (UNASYN) 3 g in sodium chloride 0.9 % 100 mL IVPB, 3 g, Intravenous, Once, Avelina Laine, PA-C  Past Medical History: Past Medical History:  Diagnosis Date  . Anxiety    husband also cancer pt  . Cancer ([place])    lung  . Complication of anesthesia    bronchospasms post-op (pulmonary did consult but pt was discharged later that day)  With last surg. she was given albuterol inhaler & had steroid with surg.    . Congenital renal atrophy    left kidney, one spontaneous stone passed   . COPD (chronic obstructive pulmonary disease) ([place])   . Coronary artery disease   . Degenerative arthritis    spine, hands & knees   . GERD  (gastroesophageal reflux disease)   . History of migraine   . Hypertension    h/o PVC- asymptomatic   . Memory disorder 03/29/2017  . 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 PONV (postoperative nausea and vomiting)   . PVC (premature ventricular contraction)   . Shortness of breath dyspnea    with exertion  . Thyroid nodule    benign by biopsy  . Varicose veins    left leg  . Visual disturbance    Episodic  . Vitamin D deficiency   . Wears glasses     Tobacco Use: Social History   Tobacco Use  Smoking Status Former Smoker  . Packs/day: 0.50  . Years: 40.00  . Pack years: 20.00  . Types: Cigarettes  . Last attempt to quit: 03/17/2013  . Years since quitting: 5.4  Smokeless Tobacco Never Used    Labs: Recent Chemical engineer    Labs for ITP Cardiac and Pulmonary Rehab Latest Ref Rng & Units 07/19/2014 07/24/2014 10/11/2017   Cholestrol 100 - 199 mg/dL - - 139   LDLCALC 0 - 99 mg/dL - - 55   HDL >39 mg/dL - - 73   Trlycerides 0 - 149 mg/dL - - 54   PHART 7.350 - 7.450 7.458(H) 7.424 -   PCO2ART 35.0 - 45.0 mmHg 31.2(L) 37.7 -  HCO3 20.0 - 24.0 mEq/L 21.8 24.7(H) -   TCO2 0 - 100 mmol/L 22.7 26 -   ACIDBASEDEF 0.0 - 2.0 mmol/L 1.5 - -   O2SAT % 99.7 97.0 -      Capillary Blood Glucose: Lab Results  Component Value Date   GLUCAP 119 (H) 04/23/2014     Pulmonary Assessment Scores: Pulmonary Assessment Scores    Row Name 07/10/18 1111 07/11/18 1603       ADL UCSD   ADL Phase  Entry  -    SOB Score total  30.5  -      CAT Score   CAT Score  13  -      mMRC Score   mMRC Score  -  1       Pulmonary Function Assessment: Pulmonary Function Assessment - 07/10/18 1015      Breath   Bilateral Breath Sounds  Decreased   absent RLL d/t previous lobectomy   Shortness of Breath  Yes;Limiting activity       Exercise Target Goals: Exercise Program Goal: Individual exercise  prescription set using results from initial 6 min walk test and THRR while considering  patient's activity barriers and safety.   Exercise Prescription Goal: Initial exercise prescription builds to 30-45 minutes a day of aerobic activity, 2-3 days per week.  Home exercise guidelines will be given to patient during program as part of exercise prescription that the participant will acknowledge.  Activity Barriers & Risk Stratification: Activity Barriers & Cardiac Risk Stratification - 07/10/18 1006      Activity Barriers & Cardiac Risk Stratification   Activity Barriers  Arthritis;Back Problems;Neck/Spine Problems;Shortness of Breath;Muscular Weakness;Balance Concerns       6 Minute Walk: 6 Minute Walk    Row Name 07/11/18 1603         6 Minute Walk   Distance  1800 feet     Walk Time  6 minutes     # of Rest Breaks  0     MPH  3.41     METS  3.6     RPE  12     Perceived Dyspnea   2     VO2 Peak  15.28 relative     Symptoms  No     Resting HR  83 bpm     Resting BP  124/76     Resting Oxygen Saturation   97 %     Exercise Oxygen Saturation  during 6 min walk  92 %     Max Ex. HR  125 bpm     Max Ex. BP  140/80     2 Minute Post BP  124/78       Interval HR   1 Minute HR  101     2 Minute HR  113     3 Minute HR  120     4 Minute HR  125     5 Minute HR  121     6 Minute HR  120     2 Minute Post HR  84     Interval Heart Rate?  Yes       Interval Oxygen   Interval Oxygen?  Yes     Baseline Oxygen Saturation %  97 %     1 Minute Oxygen Saturation %  95 %     1 Minute Liters of Oxygen  0 L     2 Minute Oxygen Saturation %  93 %  2 Minute Liters of Oxygen  0 L     3 Minute Oxygen Saturation %  92 %     3 Minute Liters of Oxygen  0 L     4 Minute Oxygen Saturation %  92 %     4 Minute Liters of Oxygen  0 L     5 Minute Oxygen Saturation %  93 %     5 Minute Liters of Oxygen  0 L     6 Minute Oxygen Saturation %  94 %     6 Minute Liters of Oxygen  0 L     2  Minute Post Oxygen Saturation %  97 %     2 Minute Post Liters of Oxygen  0 L        Oxygen Initial Assessment: Oxygen Initial Assessment - 07/11/18 1603      Home Oxygen   Home Oxygen Device  None    Sleep Oxygen Prescription  None    Home Exercise Oxygen Prescription  None    Home at Rest Exercise Oxygen Prescription  None      Initial 6 min Walk   Oxygen Used  None      Program Oxygen Prescription   Program Oxygen Prescription  None      Intervention   Short Term Goals  To learn and understand importance of monitoring SPO2 with pulse oximeter and demonstrate accurate use of the pulse oximeter.;To learn and demonstrate proper pursed lip breathing techniques or other breathing techniques.;To learn and understand importance of maintaining oxygen saturations>88%;To learn and demonstrate proper use of respiratory medications    Long  Term Goals  Maintenance of O2 saturations>88%;Verbalizes importance of monitoring SPO2 with pulse oximeter and return demonstration;Exhibits proper breathing techniques, such as pursed lip breathing or other method taught during program session;Compliance with respiratory medication;Demonstrates proper use of MDI's       Oxygen Re-Evaluation: Oxygen Re-Evaluation    Row Name 08/08/18 0733 08/28/18 0833           Program Oxygen Prescription   Program Oxygen Prescription  None  None        Home Oxygen   Home Oxygen Device  None  None      Sleep Oxygen Prescription  None  None      Home Exercise Oxygen Prescription  None  None      Home at Rest Exercise Oxygen Prescription  None  None         Oxygen Discharge (Final Oxygen Re-Evaluation): Oxygen Re-Evaluation - 08/28/18 0833      Program Oxygen Prescription   Program Oxygen Prescription  None      Home Oxygen   Home Oxygen Device  None    Sleep Oxygen Prescription  None    Home Exercise Oxygen Prescription  None    Home at Rest Exercise Oxygen Prescription  None       Initial  Exercise Prescription: Initial Exercise Prescription - 07/13/18 0700      Date of Initial Exercise RX and Referring Provider   Date  07/13/18    Referring Provider  Dr. Lamonte Sakai      NuStep   Level  2    SPM  80    Minutes  17    METs  1.5      Arm Ergometer   Level  1    Watts  10    Minutes  17      Track   Laps  8    Minutes  17      Prescription Details   Frequency (times per week)  2    Duration  Progress to 45 minutes of aerobic exercise without signs/symptoms of physical distress      Intensity   THRR 40-80% of Max Heartrate  62-125    Ratings of Perceived Exertion  11-13    Perceived Dyspnea  0-4      Progression   Progression  Continue to progress workloads to maintain intensity without signs/symptoms of physical distress.      Resistance Training   Training Prescription  Yes    Weight  orange bands    Reps  10-15       Perform Capillary Blood Glucose checks as needed.  Exercise Prescription Changes: Exercise Prescription Changes    Row Name 07/25/18 1600 08/08/18 1500 08/17/18 1317         Response to Exercise   Blood Pressure (Admit)  122/78  114/70  122/60     Blood Pressure (Exercise)  130/70  144/80  146/74     Blood Pressure (Exit)  134/80  106/70  123/64     Heart Rate (Admit)  79 bpm  89 bpm  79 bpm     Heart Rate (Exercise)  119 bpm  122 bpm  120 bpm     Heart Rate (Exit)  90 bpm  93 bpm  95 bpm     Oxygen Saturation (Admit)  97 %  96 %  95 %     Oxygen Saturation (Exercise)  95 %  97 %  93 %     Oxygen Saturation (Exit)  96 %  95 %  97 %     Rating of Perceived Exertion (Exercise)  _0 Perceived Dyspnea (Exercise)  1._1 Duration  Continue with 45 min of aerobic exercise without signs/symptoms of physical distress.  Continue with 45 min of aerobic exercise without signs/symptoms of physical distress.  Continue with 45 min of aerobic exercise without signs/symptoms of physical distress.     Intensity  Other (comment)  40-80% of HRR  THRR unchanged  THRR unchanged       Progression   Progression  Continue to progress workloads to maintain intensity without signs/symptoms of physical distress.  Continue to progress workloads to maintain intensity without signs/symptoms of physical distress.  Continue to progress workloads to maintain intensity without signs/symptoms of physical distress.       Resistance Training   Training Prescription  Yes  Yes  Yes     Weight  orange bands  orange bands  orange bands     Reps  10-15  10-15  10-15     Time  10 Minutes  10 Minutes  10 Minutes       Treadmill   MPH  -  -  2.5     Grade  -  -  0     Minutes  -  -  17       NuStep   Level  3  3  -     SPM  80  80  -     Minutes  17  17  -     METs  2.4  2.9  -       Arm Ergometer   Level  1  2  2.5     Watts  10  10  10     Minutes  _0 Track   Laps  21  21  -     Minutes  17  17  -        Exercise Comments:   Exercise Goals and Review: Exercise Goals    Row Name 07/10/18 1008             Exercise Goals   Increase Physical Activity  Yes       Intervention  Provide advice, education, support and counseling about physical activity/exercise needs.;Develop an individualized exercise prescription for aerobic and resistive training based on initial evaluation findings, risk stratification, comorbidities and participant's personal goals.       Expected Outcomes  Short Term: Attend rehab on a regular basis to increase amount of physical activity.       Increase Strength and Stamina  Yes       Intervention  Provide advice, education, support and counseling about physical activity/exercise needs.;Develop an individualized exercise prescription for aerobic and resistive training based on initial evaluation findings, risk stratification, comorbidities and participant's personal goals.       Expected Outcomes  Short Term: Increase workloads from initial exercise prescription for resistance, speed, and  METs.       Able to understand and use rate of perceived exertion (RPE) scale  Yes       Intervention  Provide education and explanation on how to use RPE scale       Expected Outcomes  Short Term: Able to use RPE daily in rehab to express subjective intensity level;Long Term:  Able to use RPE to guide intensity level when exercising independently       Able to understand and use Dyspnea scale  Yes       Intervention  Provide education and explanation on how to use Dyspnea scale       Expected Outcomes  Short Term: Able to use Dyspnea scale daily in rehab to express subjective sense of shortness of breath during exertion;Long Term: Able to use Dyspnea scale to guide intensity level when exercising independently       Knowledge and understanding of Target Heart Rate Range (THRR)  Yes       Intervention  Provide education and explanation of THRR including how the numbers were predicted and where they are located for reference       Expected Outcomes  Short Term: Able to state/look up THRR;Long Term: Able to use THRR to govern intensity when exercising independently;Short Term: Able to use daily as guideline for intensity in rehab       Understanding of Exercise Prescription  Yes       Intervention  Provide education, explanation, and written materials on patient's individual exercise prescription       Expected Outcomes  Short Term: Able to explain program exercise prescription;Long Term: Able to explain home exercise prescription to exercise independently          Exercise Goals Re-Evaluation : Exercise Goals Re-Evaluation    Row Name 08/08/18 0731 08/28/18 0833           Exercise Goal Re-Evaluation   Exercise Goals Review  Increase Physical Activity;Increase Strength and Stamina;Able to understand and use rate of perceived exertion (RPE) scale;Able to understand and use Dyspnea scale;Knowledge and understanding of Target Heart Rate Range (THRR);Understanding of Exercise Prescription  -       Comments  Patient is progressing  well in rehab. She is able to walk 23 laps (231f) in 15 minutes. MET average places her in a moderate level. We have discussed home exercise. [facility] States her biggest barrier is her diet. We have discusses this at length and she will meet with the dietician. Will cont. to monitor patient and progress as able.  Pulmonary Rehab is closed (starting 08/21/18) until [facility]       Expected Outcomes  Through exercise at rehab and at home, the patient will decrease shortness of breath with daily activities and feel confident in carrying out an exercise regime at home.   -         Discharge Exercise Prescription (Final Exercise Prescription Changes): Exercise Prescription Changes - 08/17/18 1317      Response to Exercise   Blood Pressure (Admit)  122/60    Blood Pressure (Exercise)  146/74    Blood Pressure (Exit)  123/64    Heart Rate (Admit)  79 bpm    Heart Rate (Exercise)  120 bpm    Heart Rate (Exit)  95 bpm    Oxygen Saturation (Admit)  95 %    Oxygen Saturation (Exercise)  93 %    Oxygen Saturation (Exit)  97 %    Rating of Perceived Exertion (Exercise)  12    Perceived Dyspnea (Exercise)  1    Duration  Continue with 45 min of aerobic exercise without signs/symptoms of physical distress.    Intensity  THRR unchanged      Progression   Progression  Continue to progress workloads to maintain intensity without signs/symptoms of physical distress.      Resistance Training   Training Prescription  Yes    Weight  orange bands    Reps  10-15    Time  10 Minutes      Treadmill   MPH  2.5    Grade  0    Minutes  17      Arm Ergometer   Level  2.5    Watts  10    Minutes  17       Nutrition:  Target Goals: Understanding of nutrition guidelines, daily intake of sodium <15054m cholesterol <20047mcalories 30% from fat and 7% or  less from saturated fats, daily to have 5 or more servings of fruits and vegetables.  Biometrics: Pre Biometrics - 07/10/18 1011      Pre Biometrics   Grip Strength  28 kg        Nutrition Therapy Plan and Nutrition Goals: Nutrition Therapy & Goals - 07/21/18 1040      Nutrition Therapy   Diet  general healthful      Personal Nutrition Goals   Nutrition Goal  Identify food quantities necessary to achieve wt loss of  -2# per week to a goal wt loss of 2.7-10.9 kg (6-24 lb) at graduation from pulmonary rehab.    Personal Goal #2  Pt to build a healthy plate including fruits, vegetables, whole grains, and low-fat dairy products in a healthy meal plan.    Personal Goal #3  Weigh and measure portions for accuracy      Intervention Plan   Intervention  Prescribe, educate and counsel regarding individualized specific dietary modifications aiming towards targeted core components such as weight, hypertension, lipid management, diabetes, heart failure and other comorbidities.    Expected Outcomes  Short Term Goal: Understand basic principles of dietary content, such as calories, fat, sodium, cholesterol and nutrients.;Long Term Goal: Adherence to prescribed nutrition plan.       Nutrition Assessments: Nutrition Assessments - 07/21/18 1043      Rate Your Plate Scores   Pre Score  44       Nutrition Goals Re-Evaluation: Nutrition Goals Re-Evaluation    Row Name 07/21/18 1040             Goals   Current Weight  171 lb 4.8 oz (77.7 kg)          Nutrition Goals Discharge (Final Nutrition Goals Re-Evaluation): Nutrition Goals Re-Evaluation - 07/21/18 1040      Goals   Current Weight  171 lb 4.8 oz (77.7 kg)       Psychosocial: Target Goals: Acknowledge presence or absence of significant depression and/or stress, maximize coping skills, provide positive support system. Participant is able to verbalize types and ability to use techniques and skills needed for reducing stress  and depression.  Initial Review & Psychosocial Screening: Initial Psych Review & Screening - 08/07/18 1122      Initial Review   Current issues with  Current Sleep Concerns;Current Stress Concerns    Source of Stress Concerns  Family;Financial    Comments  Seems to be enjoying the pulmonary rehab program, seems upbeat and positive.      Family Dynamics   Good Support System?  Yes    Concerns  Recent loss of significant other      Barriers   Psychosocial barriers to participate in program  The patient should benefit from training in stress management and relaxation.       Quality of Life Scores:  Scores of 19 and below usually indicate a poorer quality of life in these areas.  A difference of  2-3 points is a clinically meaningful difference.  A difference of 2-3 points in the total score of the Quality of Life Index has been associated with significant improvement in overall quality of life, self-image, physical symptoms, and general health in studies assessing change in quality of life.  PHQ-9: Recent Review Flowsheet Data    Depression screen [facility] 2/9 07/10/2018 06/05/2014 05/23/2014 04/26/2014   Decreased Interest 0 0 0 -   Down, Depressed, Hopeless 0 0 0 0   PHQ - 2 Score 0 0 0 0   Altered sleeping 0 - - -   Tired, decreased energy 1 - - -   Change in appetite 0 - - -   Feeling bad or failure about yourself  0 - - -   Trouble concentrating 0 - - -   Moving slowly or fidgety/restless 0 - - -   Suicidal thoughts 0 - - -   PHQ-9 Score 1 - - -   Difficult doing work/chores Not difficult at all - - -     Interpretation of Total Score  Total Score Depression Severity:  1-4 = Minimal depression, 5-9 = Mild depression, 10-14 = Moderate depression, 15-19 = Moderately severe depression, 20-27 = Severe depression   Psychosocial Evaluation and Intervention: Psychosocial Evaluation - 07/10/18 1132      Psychosocial Evaluation & Interventions   Comments  pt lost husband a year and  a half ago and states she has had stress finding the new normal with her grown daughter. Daughter is Education officer, museum and is looking to move to [place], which will be another change for her. Pt states she does  have a group of friends that support her and she is able to get out with.    Expected Outcomes  pt will maintain a posive and healthy outlook while maintaining positive coping skills    Continue Psychosocial Services   Follow up required by staff       Psychosocial Re-Evaluation: Psychosocial Re-Evaluation    [place] Name 08/07/18 1124 08/29/18 1544           Psychosocial Re-Evaluation   Current issues with  Current Stress Concerns  Current Stress Concerns      Comments  Recent loss of husband, using healthy coping mechanisms   Recent loss of husband, using healthy coping mechanisms       Expected Outcomes  continued use of healthy coping mechanisms  continued use of healthy coping mechanisms      Interventions  Stress management education;Encouraged to attend Pulmonary Rehabilitation for the exercise;Relaxation education  Stress management education;Encouraged to attend Pulmonary Rehabilitation for the exercise;Relaxation education      Continue Psychosocial Services   Follow up required by staff  -      Comments  Stress and relaxation classes to assist in stress management  Stress and relaxation classes to assist in stress management        Initial Review   Source of Stress Concerns  Family;Financial  Family;Financial         Psychosocial Discharge (Final Psychosocial Re-Evaluation): Psychosocial Re-Evaluation - 08/29/18 1544      Psychosocial Re-Evaluation   Current issues with  Current Stress Concerns    Comments  Recent loss of husband, using healthy coping mechanisms     Expected Outcomes  continued use of healthy coping mechanisms    Interventions  Stress management education;Encouraged to attend Pulmonary Rehabilitation for the exercise;Relaxation education    Comments   Stress and relaxation classes to assist in stress management      Initial Review   Source of Stress Concerns  Family;Financial       Education: Education Goals: Education classes will be provided on a weekly basis, covering required topics. Participant will state understanding/return demonstration of topics presented.  Learning Barriers/Preferences: Learning Barriers/Preferences - 07/10/18 1020      Learning Barriers/Preferences   Learning Barriers  Sight    Learning Preferences  Skilled Demonstration;Group Instruction;Individual Instruction;Audio       Education Topics: Risk Factor Reduction:  -Group instruction that is supported by a PowerPoint presentation. Instructor discusses the definition of a risk factor, different risk factors for pulmonary disease, and how the heart and lungs work together.     Nutrition for Pulmonary Patient:  -Group instruction provided by PowerPoint slides, verbal discussion, and written materials to support subject matter. The instructor gives an explanation and review of healthy diet recommendations, which includes a discussion on weight management, recommendations for fruit and vegetable consumption, as well as protein, fluid, caffeine, fiber, sodium, sugar, and alcohol. Tips for eating when patients are short of breath are discussed.   Pursed Lip Breathing:  -Group instruction that is supported by demonstration and informational handouts. Instructor discusses the benefits of pursed lip and diaphragmatic breathing and detailed demonstration on how to preform both.     Oxygen Safety:  -Group instruction provided by PowerPoint, verbal discussion, and written material to support subject matter. There is an overview of "What is Oxygen" and "Why do we need it".  Instructor also reviews how to create a safe environment for oxygen use, the importance of using oxygen as prescribed, and  the risks of noncompliance. There is a brief discussion on traveling with  oxygen and resources the patient may utilize.   Oxygen Equipment:  -Group instruction provided by [facility] Staff utilizing handouts, written materials, and equipment demonstrations.   PULMONARY REHAB OTHER RESPIRATORY from 08/17/2018 in [place]  Date  07/20/18  Educator  Ace Gins Rep  Instruction Review Code  1- Verbalizes Understanding      Signs and Symptoms:  -Group instruction provided by written material and verbal discussion to support subject matter. Warning signs and symptoms of infection, stroke, and heart attack are reviewed and when to call the physician/911 reinforced. Tips for preventing the spread of infection discussed.   PULMONARY REHAB OTHER RESPIRATORY from 08/17/2018 in [place]  Date  08/03/18  Educator  Remo Lipps  Instruction Review Code  1- Verbalizes Understanding      Advanced Directives:  -Group instruction provided by verbal instruction and written material to support subject matter. Instructor reviews Advanced Directive laws and proper instruction for filling out document.   Pulmonary Video:  -Group video education that reviews the importance of medication and oxygen compliance, exercise, good nutrition, pulmonary hygiene, and pursed lip and diaphragmatic breathing for the pulmonary patient.   Exercise for the Pulmonary Patient:  -Group instruction that is supported by a PowerPoint presentation. Instructor discusses benefits of exercise, core components of exercise, frequency, duration, and intensity of an exercise routine, importance of utilizing pulse oximetry during exercise, safety while exercising, and options of places to exercise outside of rehab.     PULMONARY REHAB OTHER RESPIRATORY from 08/17/2018 in [place]  Date  07/27/18  Educator  Cloyde Reams  Instruction Review Code  1- Verbalizes Understanding      Pulmonary Medications:  -Verbally interactive  group education provided by instructor with focus on inhaled medications and proper administration.   Anatomy and Physiology of the Respiratory System and Intimacy:  -Group instruction provided by PowerPoint, verbal discussion, and written material to support subject matter. Instructor reviews respiratory cycle and anatomical components of the respiratory system and their functions. Instructor also reviews differences in obstructive and restrictive respiratory diseases with examples of each. Intimacy, Sex, and Sexuality differences are reviewed with a discussion on how relationships can change when diagnosed with pulmonary disease. Common sexual concerns are reviewed.   PULMONARY REHAB OTHER RESPIRATORY from 08/17/2018 in [place]  Date  08/17/18  Educator   RN  Instruction Review Code  1- Verbalizes Understanding      MD DAY -A group question and answer session with a medical doctor that allows participants to ask questions that relate to their pulmonary disease state.   OTHER EDUCATION -Group or individual verbal, written, or video instructions that support the educational goals of the pulmonary rehab program.   Holiday Eating Survival Tips:  -Group instruction provided by PowerPoint slides, verbal discussion, and written materials to support subject matter. The instructor gives patients tips, tricks, and techniques to help them not only survive but enjoy the holidays despite the onslaught of food that accompanies the holidays.   Knowledge Questionnaire Score: Knowledge Questionnaire Score - 07/10/18 1122      Knowledge Questionnaire Score   Pre Score  15/18       Core Components/Risk Factors/Patient Goals at Admission: Personal Goals and Risk Factors at Admission - 07/10/18 1020      Core Components/Risk Factors/Patient Goals on Admission    Weight  Management  Weight Loss;Yes    Intervention  Weight Management: Develop a combined nutrition and  exercise program designed to reach desired caloric intake, while maintaining appropriate intake of nutrient and fiber, sodium and fats, and appropriate energy expenditure required for the weight goal.    Admit Weight  171 lb 4.8 oz (77.7 kg)    Goal Weight: Short Term  159 lb (72.1 kg)    Goal Weight: Long Term  130 lb (59 kg)    Expected Outcomes  Weight Loss: Understanding of general recommendations for a balanced deficit meal plan, which promotes 1-2 lb weight loss per week and includes a negative energy balance of [id number] kcal/d;Short Term: Continue to assess and modify interventions until short term weight is achieved;Long Term: Adherence to nutrition and physical activity/exercise program aimed toward attainment of established weight goal;Understanding of distribution of calorie intake throughout the day with the consumption of 4-5 meals/snacks;Understanding recommendations for meals to include 15-35% energy as protein, 25-35% energy from fat, 35-60% energy from carbohydrates, less than 244m of dietary cholesterol, 20-35 gm of total fiber daily    Improve shortness of breath with ADL's  Yes    Intervention  Provide education, individualized exercise plan and daily activity instruction to help decrease symptoms of SOB with activities of daily living.    Expected Outcomes  Short Term: Improve cardiorespiratory fitness to achieve a reduction of symptoms when performing ADLs    Hypertension  Yes    Intervention  Provide education on lifestyle modifcations including regular physical activity/exercise, weight management, moderate sodium restriction and increased consumption of fresh fruit, vegetables, and low fat dairy, alcohol moderation, and smoking cessation.;Monitor prescription use compliance.    Expected Outcomes  Short Term: Continued assessment and intervention until BP is < 140/986mHG in hypertensive participants. < 130/8039mG in hypertensive participants with diabetes, heart failure or  chronic kidney disease.;Long Term: Maintenance of blood pressure at goal levels.    Stress  Yes    Intervention  Offer individual and/or small group education and counseling on adjustment to heart disease, stress management and health-related lifestyle change. Teach and support self-help strategies.;Refer participants experiencing significant psychosocial distress to appropriate mental health specialists for further evaluation and treatment. When possible, include family members and significant others in education/counseling sessions.    Expected Outcomes  Short Term: Participant demonstrates changes in health-related behavior, relaxation and other stress management skills, ability to obtain effective social support, and compliance with psychotropic medications if prescribed.;Long Term: Emotional wellbeing is indicated by absence of clinically significant psychosocial distress or social isolation.       Core Components/Risk Factors/Patient Goals Review:  Goals and Risk Factor Review    Row Name 07/10/18 1023 08/07/18 1128 08/29/18 1545         Core Components/Risk Factors/Patient Goals Review   Personal Goals Review  Weight Management/Obesity;Improve shortness of breath with ADL's;Increase knowledge of respiratory medications and ability to use respiratory devices properly.;Develop more efficient breathing techniques such as purse lipped breathing and diaphragmatic breathing and practicing self-pacing with activity.;Hypertension;Stress  Weight Management/Obesity;Improve shortness of breath with ADL's;Increase knowledge of respiratory medications and ability to use respiratory devices properly.;Stress;Develop more efficient breathing techniques such as purse lipped breathing and diaphragmatic breathing and practicing self-pacing with activity.  Weight Management/Obesity;Improve shortness of breath with ADL's;Increase knowledge of respiratory medications and ability to use respiratory devices  properly.;Stress;Develop more efficient breathing techniques such as purse lipped breathing and diaphragmatic breathing and practicing self-pacing with activity.     Review  -  has attended 6 exercise sessions, no weight loss as of yet, workloads are increasing on the nustep, arm ergomenter, and walking track  Maintaining weight, works hard while exercicing, seems to enjoy the program, unfortunately the program is closed for COVID-19 precautions, hopefully to reopen 09/26/2018.     Expected Outcomes  -  See admission goals  See admission goals        Core Components/Risk Factors/Patient Goals at Discharge (Final Review):  Goals and Risk Factor Review - 08/29/18 1545      Core Components/Risk Factors/Patient Goals Review   Personal Goals Review  Weight Management/Obesity;Improve shortness of breath with ADL's;Increase knowledge of respiratory medications and ability to use respiratory devices properly.;Stress;Develop more efficient breathing techniques such as purse lipped breathing and diaphragmatic breathing and practicing self-pacing with activity.    Review  Maintaining weight, works hard while exercicing, seems to enjoy the program, unfortunately the program is closed for COVID-19 precautions, hopefully to reopen 09/26/2018.    Expected Outcomes  See admission goals       ITP Comments: ITP Comments    Row Name 07/10/18 0951           ITP Comments  Dr. Jennet Maduro, Medical Director, Pulmonary Director          Comments: ITP REVIEW Pt is making expected progress toward pulmonary rehab goals after completing 10 sessions. Recommend continued exercise, life style modification, education, and utilization of breathing techniques to increase stamina and strength and decrease shortness of breath with exertion.

## 2018-08-31 ENCOUNTER — Encounter (HOSPITAL_COMMUNITY): Payer: Managed Care, Other (non HMO)

## 2018-09-01 ENCOUNTER — Telehealth: Payer: Self-pay | Admitting: Emergency Medicine

## 2018-09-01 NOTE — Telephone Encounter (Signed)
Spoke with pt.  She had concerns about being around her daughter that still works in [employer].  Went over COVID exposure guidelines with pt.  PT verbalized understanding.  Nothing further needed at this time.

## 2018-09-05 ENCOUNTER — Encounter (HOSPITAL_COMMUNITY): Payer: Managed Care, Other (non HMO)

## 2018-09-07 ENCOUNTER — Encounter (HOSPITAL_COMMUNITY): Payer: Managed Care, Other (non HMO)

## 2018-09-12 ENCOUNTER — Encounter (HOSPITAL_COMMUNITY): Payer: Managed Care, Other (non HMO)

## 2018-09-14 ENCOUNTER — Encounter (HOSPITAL_COMMUNITY): Payer: Managed Care, Other (non HMO)

## 2018-09-19 ENCOUNTER — Encounter (HOSPITAL_COMMUNITY): Payer: Managed Care, Other (non HMO)

## 2018-09-19 ENCOUNTER — Telehealth (HOSPITAL_COMMUNITY): Payer: Self-pay

## 2018-09-19 ENCOUNTER — Telehealth: Payer: Self-pay | Admitting: Interventional Cardiology

## 2018-09-19 NOTE — Telephone Encounter (Signed)
Left message to call back Attempted to contact pt in regards to appt with Dr. Tamala Julian on 4/22.  Needs to be changed to a virtual video visit.  Was going to offer 4/21 at 2pm or any available slot on 4/23.

## 2018-09-20 NOTE — Telephone Encounter (Signed)
Spoke with pt and she does not wish to do a virtual visit.  She prefers to wait until August and see Dr. Tamala Julian in person.  Rescheduled pt for 8/17.  Advised to call sooner if any issues.

## 2018-09-21 ENCOUNTER — Encounter (HOSPITAL_COMMUNITY): Payer: Managed Care, Other (non HMO)

## 2018-09-26 ENCOUNTER — Encounter (HOSPITAL_COMMUNITY): Payer: Managed Care, Other (non HMO)

## 2018-09-27 ENCOUNTER — Ambulatory Visit: Payer: Managed Care, Other (non HMO) | Admitting: Interventional Cardiology

## 2018-09-28 ENCOUNTER — Encounter (HOSPITAL_COMMUNITY): Payer: Managed Care, Other (non HMO)

## 2018-10-03 ENCOUNTER — Encounter (HOSPITAL_COMMUNITY): Payer: Managed Care, Other (non HMO)

## 2018-10-05 ENCOUNTER — Encounter (HOSPITAL_COMMUNITY): Payer: Managed Care, Other (non HMO)

## 2018-10-10 ENCOUNTER — Encounter (HOSPITAL_COMMUNITY): Payer: Managed Care, Other (non HMO)

## 2018-10-12 ENCOUNTER — Encounter (HOSPITAL_COMMUNITY): Payer: Managed Care, Other (non HMO)

## 2018-10-17 ENCOUNTER — Telehealth (HOSPITAL_COMMUNITY): Payer: Self-pay | Admitting: *Deleted

## 2018-10-17 ENCOUNTER — Encounter (HOSPITAL_COMMUNITY): Payer: Managed Care, Other (non HMO)

## 2018-10-19 ENCOUNTER — Encounter (HOSPITAL_COMMUNITY): Payer: Managed Care, Other (non HMO)

## 2018-10-26 DIAGNOSIS — M859 Disorder of bone density and structure, unspecified: Secondary | ICD-10-CM | POA: Diagnosis not present

## 2018-10-26 DIAGNOSIS — I2510 Atherosclerotic heart disease of native coronary artery without angina pectoris: Secondary | ICD-10-CM | POA: Diagnosis not present

## 2018-10-26 DIAGNOSIS — I10 Essential (primary) hypertension: Secondary | ICD-10-CM | POA: Diagnosis not present

## 2018-10-26 DIAGNOSIS — E538 Deficiency of other specified B group vitamins: Secondary | ICD-10-CM | POA: Diagnosis not present

## 2018-11-01 ENCOUNTER — Telehealth (HOSPITAL_COMMUNITY): Payer: Self-pay | Admitting: *Deleted

## 2018-11-03 DIAGNOSIS — I2510 Atherosclerotic heart disease of native coronary artery without angina pectoris: Secondary | ICD-10-CM | POA: Diagnosis not present

## 2018-11-03 DIAGNOSIS — Z0000 Encounter for general adult medical examination without abnormal findings: Secondary | ICD-10-CM | POA: Diagnosis not present

## 2018-11-03 DIAGNOSIS — F419 Anxiety disorder, unspecified: Secondary | ICD-10-CM | POA: Diagnosis not present

## 2018-11-03 DIAGNOSIS — E559 Vitamin D deficiency, unspecified: Secondary | ICD-10-CM | POA: Diagnosis not present

## 2018-11-03 DIAGNOSIS — E538 Deficiency of other specified B group vitamins: Secondary | ICD-10-CM | POA: Diagnosis not present

## 2018-11-03 DIAGNOSIS — D692 Other nonthrombocytopenic purpura: Secondary | ICD-10-CM | POA: Diagnosis not present

## 2018-11-03 DIAGNOSIS — F330 Major depressive disorder, recurrent, mild: Secondary | ICD-10-CM | POA: Diagnosis not present

## 2018-11-03 DIAGNOSIS — M8580 Other specified disorders of bone density and structure, unspecified site: Secondary | ICD-10-CM | POA: Diagnosis not present

## 2018-11-03 DIAGNOSIS — J449 Chronic obstructive pulmonary disease, unspecified: Secondary | ICD-10-CM | POA: Diagnosis not present

## 2018-11-03 DIAGNOSIS — I10 Essential (primary) hypertension: Secondary | ICD-10-CM | POA: Diagnosis not present

## 2018-11-03 DIAGNOSIS — R413 Other amnesia: Secondary | ICD-10-CM | POA: Diagnosis not present

## 2018-11-03 DIAGNOSIS — G459 Transient cerebral ischemic attack, unspecified: Secondary | ICD-10-CM | POA: Diagnosis not present

## 2018-11-20 ENCOUNTER — Telehealth: Payer: Self-pay | Admitting: *Deleted

## 2018-11-20 NOTE — Telephone Encounter (Signed)
Due to current COVID 19 pandemic, our office is severely reducing in office visits until further notice, in order to minimize the risk to our patients and healthcare providers. Unable to get in contact with the patient to convert their office visit with Judson Roch on 11/21/2018 into a doxy.me visit. I left a voicemail asking the patient to return my call. Office number was provided.     If patient calls back please convert their office visit into a doxy.me visit.

## 2018-11-21 ENCOUNTER — Ambulatory Visit: Payer: Managed Care, Other (non HMO) | Admitting: Neurology

## 2018-11-21 DIAGNOSIS — Z1231 Encounter for screening mammogram for malignant neoplasm of breast: Secondary | ICD-10-CM | POA: Diagnosis not present

## 2018-12-04 DIAGNOSIS — L738 Other specified follicular disorders: Secondary | ICD-10-CM | POA: Diagnosis not present

## 2018-12-14 ENCOUNTER — Other Ambulatory Visit: Payer: Self-pay | Admitting: Medical Oncology

## 2018-12-14 ENCOUNTER — Telehealth: Payer: Self-pay | Admitting: Medical Oncology

## 2018-12-14 NOTE — Telephone Encounter (Signed)
CT scan Supposed to be at [facility] imaging-need orders changed from [facility] to GI

## 2018-12-18 ENCOUNTER — Other Ambulatory Visit: Payer: Self-pay | Admitting: *Deleted

## 2018-12-18 DIAGNOSIS — C3490 Malignant neoplasm of unspecified part of unspecified bronchus or lung: Secondary | ICD-10-CM

## 2018-12-22 ENCOUNTER — Telehealth (HOSPITAL_COMMUNITY): Payer: Self-pay

## 2018-12-27 ENCOUNTER — Telehealth (HOSPITAL_COMMUNITY): Payer: Self-pay | Admitting: Internal Medicine

## 2019-01-08 ENCOUNTER — Ambulatory Visit: Payer: Managed Care, Other (non HMO) | Admitting: Emergency Medicine

## 2019-01-09 DIAGNOSIS — D485 Neoplasm of uncertain behavior of skin: Secondary | ICD-10-CM | POA: Diagnosis not present

## 2019-01-09 DIAGNOSIS — L720 Epidermal cyst: Secondary | ICD-10-CM | POA: Diagnosis not present

## 2019-01-09 DIAGNOSIS — L738 Other specified follicular disorders: Secondary | ICD-10-CM | POA: Diagnosis not present

## 2019-01-09 DIAGNOSIS — L821 Other seborrheic keratosis: Secondary | ICD-10-CM | POA: Diagnosis not present

## 2019-01-11 DIAGNOSIS — D485 Neoplasm of uncertain behavior of skin: Secondary | ICD-10-CM | POA: Diagnosis not present

## 2019-01-21 NOTE — Progress Notes (Signed)
Cardiology Office Note:    Date:  01/22/2019   ID:  DECLAN [name], DOB 1955/10/04, MRN [id number]  PCP:  [name], MD  Cardiologist:  No primary care provider on file.   Referring MD: [name], MD   Chief Complaint  Patient presents with  . Coronary Artery Disease  . Shortness of Breath  . Chest Pain    History of Present Illness:    [name] [name] is a 63 y.o. female with a hx of asymptomatic CAD denoted by coronary calcification on non-cardiac CT, hyperlipidemia, hypertension, and prior smoking history. S/p surgical and radiation therapy of lung cancer.  Tori is here alone.  She seems to be doing okay.  She has an upcoming chest CT to follow-up her lung cancer.  The last scan done in January did not reveal any evidence of recurrence.  She has coronary disease based on the presence of calcification noted in the coronaries on 9 coronary/cardiac CT imaging related to lung cancer diagnosis.  We did a nuclear perfusion study in March 2019 that was low risk for ischemia.  She states that it is not infrequent that she awakens from sleep many mornings with a feeling of tightness in her chest.  This can last up to 2 hours.  It gradually subsides.  She has exertional related shortness of breath.  She has had a partial pneumonectomy and has COPD because of heavy prior cigarette smoking.  She does not get exertional related episodes of chest discomfort.  Past Medical History:  Diagnosis Date  . Anxiety    husband also cancer pt  . Cancer ([place])    lung  . Complication of anesthesia    bronchospasms post-op (pulmonary did consult but pt was discharged later that day)  With last surg. she was given albuterol inhaler & had steroid with surg.    . Congenital renal atrophy    left kidney, one spontaneous stone passed   . COPD (chronic obstructive pulmonary disease) ([place])   . Coronary artery disease   . Degenerative arthritis    spine, hands & knees   . GERD  (gastroesophageal reflux disease)   . History of migraine   . Hypertension    h/o PVC- asymptomatic   . Memory disorder 03/29/2017  . 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 PONV (postoperative nausea and vomiting)   . PVC (premature ventricular contraction)   . Shortness of breath dyspnea    with exertion  . Thyroid nodule    benign by biopsy  . Varicose veins    left leg  . Visual disturbance    Episodic  . Vitamin D deficiency   . Wears glasses     Past Surgical History:  Procedure Laterality Date  . CARPAL TUNNEL RELEASE Left   . CESAREAN SECTION    . COLONOSCOPY W/ BIOPSIES AND POLYPECTOMY    . DILATION AND CURETTAGE OF UTERUS    . TONSILLECTOMY    . VARICOSE VEIN SURGERY     left  . VIDEO ASSISTED THORACOSCOPY (VATS)/ LOBECTOMY Right 07/23/2014   Procedure: VIDEO ASSISTED THORACOSCOPY (VATS)/ LOBECTOMY;  Surgeon: Ivin Poot, MD;  Location: [place];  Service: Thoracic;  Laterality: Right;  [name] Kitchen VIDEO BRONCHOSCOPY N/A 07/23/2014   Procedure: VIDEO BRONCHOSCOPY;  Surgeon: Ivin Poot, MD;  Location: [place];  Service: Thoracic;  Laterality: N/A;  . VIDEO BRONCHOSCOPY WITH ENDOBRONCHIAL ULTRASOUND Right 04/17/2014  Procedure: VIDEO BRONCHOSCOPY WITH ENDOBRONCHIAL ULTRASOUND;  Surgeon: Collene Gobble, MD;  Location: [facility];  Service: Thoracic;  Laterality: Right;  [name] Kitchen VIDEO BRONCHOSCOPY WITH ENDOBRONCHIAL ULTRASOUND N/A 07/08/2014   Procedure: VIDEO BRONCHOSCOPY WITH ENDOBRONCHIAL ULTRASOUND;  Surgeon: Ivin Poot, MD;  Location: [facility];  Service: Thoracic;  Laterality: N/A;    Current Medications: Current Meds  Medication Sig  . albuterol (PROAIR HFA) 108 (90 Base) MCG/ACT inhaler Inhale 1 puff into the lungs 3 (three) times daily as needed for wheezing or shortness of breath.  [name] Kitchen aspirin EC 81 MG tablet Take 1 tablet (81 mg total) by mouth daily.  . rosuvastatin (CRESTOR) 10 MG tablet Take 1  tablet (10 mg total) by mouth daily.  . verapamil (CALAN-SR) 240 MG CR tablet Take 1 tablet (240 mg total) by mouth at bedtime.  . Vitamin D, Ergocalciferol, (DRISDOL) 50000 units CAPS capsule Take 50,000 Units by mouth every 7 (seven) days.   Current Facility-Administered Medications for the 01/22/19 encounter (Office Visit) with Belva Crome, MD  Medication  . Ampicillin-Sulbactam (UNASYN) 3 g in sodium chloride 0.9 % 100 mL IVPB     Allergies:   Advil [ibuprofen], Hydrocodone-guaifenesin, Sulfa antibiotics, Temazepam, Plasticized base [plastibase], and Zofran [ondansetron hcl]   Social History   Socioeconomic History  . Marital status: Widowed    Spouse name: Not on file  . Number of children: 1  . Years of education: 19  . Highest education level: Not on file  Occupational History  . Occupation: Nurse    Employer: [employer]  Social Needs  . Financial resource strain: Not on file  . Food insecurity    Worry: Not on file    Inability: Not on file  . Transportation needs    Medical: Not on file    Non-medical: Not on file  Tobacco Use  . Smoking status: Former Smoker    Packs/day: 0.50    Years: 40.00    Pack years: 20.00    Types: Cigarettes    Quit date: 03/17/2013    Years since quitting: 5.8  . Smokeless tobacco: Never Used  Substance and Sexual Activity  . Alcohol use: Yes    Alcohol/week: 0.0 standard drinks    Comment: " rare"  . Drug use: No  . Sexual activity: Not on file  Lifestyle  . Physical activity    Days per week: Not on file    Minutes per session: Not on file  . Stress: Not on file  Relationships  . Social Herbalist on phone: Not on file    Gets together: Not on file    Attends religious service: Not on file    Active member of club or organization: Not on file    Attends meetings of clubs or organizations: Not on file    Relationship status: Not on file  Other Topics Concern  . Not on file  Social History Narrative    Lives alone. Husband passed away 18-Sep-2016   Caffeine use: 1-2 cups coffee per day   Right handed      Family History: The patient's family history includes Atrial fibrillation in her father; Cancer in her mother; Diverticulitis in her brother and father; Headache in her sister; Hypertension in her father, sister, and sister.  ROS:   Please see the history of present illness.    Appetite is good.  She was in pulmonary rehab but this was canceled because of COVID-19.  Her daughter  Randall Hiss is moving back in with her.  Her kids have a difficult time finding her place.  She is now considering moving to [place], [place], or [place].  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  All other systems reviewed and are negative.  EKGs/Labs/Other Studies Reviewed:    The following studies were reviewed today:   Stress Nuclear Perfusion 08/30/2017: Study Highlights    Nuclear stress EF: 45%.  There was no ST segment deviation noted during stress.  The study is normal.  This is a low risk study.  The left ventricular ejection fraction is mildly decreased (45-54%).  Blood pressure demonstrated a hypertensive response to exercise.   Normal exercise nuclear stress test with no evidence for prior infarct or ischemia.  Mildly decreased exercise capacity. Hypertensive response to exercise (197/97 mmHg).     EKG:  EKG normal sinus rhythm with nonspecific T wave flattening.  Overall no acute ST-T wave change and when compared to prior tracing from March 2019, the non-specific ST flattening is new.  Recent Labs: 07/26/2018: ALT 19; BUN 14; Creatinine 0.83; Hemoglobin 13.6; Platelet Count 243; Potassium 4.3; Sodium 141  Recent Lipid Panel    Component Value Date/Time   CHOL 139 10/11/2017 1023   TRIG 54 10/11/2017 1023   HDL 73 10/11/2017 1023   CHOLHDL 1.9 10/11/2017 1023   LDLCALC 55 10/11/2017 1023    Physical Exam:    VS:  BP (!) 142/80   Pulse 69   Ht 5\' 5"  (1.651 m)   Wt 170 lb  12.8 oz (77.5 kg)   SpO2 95%   BMI 28.42 kg/m     Wt Readings from Last 3 Encounters:  01/22/19 170 lb 12.8 oz (77.5 kg)  08/17/18 169 lb 8.5 oz (76.9 kg)  08/08/18 170 lb 3.1 oz (77.2 kg)     GEN: Mildly overweight. No acute distress HEENT: Normal NECK: No JVD. LYMPHATICS: No lymphadenopathy CARDIAC:  RRR without murmur, gallop, or edema. VASCULAR:  Normal Pulses. No bruits. RESPIRATORY:  Clear to auscultation without rales, wheezing or rhonchi  ABDOMEN: Soft, non-tender, non-distended, No pulsatile mass, MUSCULOSKELETAL: No deformity  SKIN: Warm and dry NEUROLOGIC:  Alert and oriented x 3 PSYCHIATRIC:  Normal affect   ASSESSMENT:    1. Coronary artery calcification seen on CT scan   2. Primary cancer of right lower lobe of lung ([place])   3. Hyperlipidemia with target LDL less than 70   4. Hx of tobacco use, presenting hazards to health   5. Frequent PVCs   6. Chronic obstructive pulmonary disease, unspecified COPD type ([place])   7. Educated About Covid-19 Virus Infection    PLAN:    In order of problems listed above:  1. Presumed asymptomatic coronary disease denoted by the presence of coronary calcification.  We will perform coronary CTA with morphology and FFR if indicated.  She has vague chest discomfort with features suggestive of ischemia.  Atypical and that the chest discomfort is most prevalent when she awakens each morning and is noted to be a vague background tightness in the chest.  She does have dyspnea on exertion but has underlying pulmonary disease and partial pneumonectomy because of lung cancer. 2. Has an upcoming follow-up CT scan to look for evidence of recurrent disease. 3. The LDL cholesterol on low-dose statin therapy is quite good at 61 in March 2000 20. 4. She does not currently smoke 5. Not addressed 6. In pulmonary rehab she was making improvement in overall functional status.  This needs to be resumed whenever possible. 7. Masking, waiting, and  handwashing discussed and recommended to prevent COVID 19 infection.  Overall education and awareness concerning primary/secondary risk prevention was discussed in detail: LDL less than 70, hemoglobin A1c less than 7, blood pressure target less than 130/80 mmHg, >150 minutes of moderate aerobic activity per week, avoidance of smoking, weight control (via diet and exercise), and continued surveillance/management of/for obstructive sleep apnea.    Medication Adjustments/Labs and Tests Ordered: Current medicines are reviewed at length with the patient today.  Concerns regarding medicines are outlined above.  Orders Placed This Encounter  Procedures  . CT CORONARY MORPH W/CTA COR W/SCORE W/CA W/CM &/OR WO/CM  . CT CORONARY FRACTIONAL FLOW RESERVE DATA PREP  . CT CORONARY FRACTIONAL FLOW RESERVE FLUID ANALYSIS  . Basic metabolic panel  . EKG 12-Lead   Meds ordered this encounter  Medications  . metoprolol tartrate (LOPRESSOR) 100 MG tablet    Sig: Take one tablet by mouth 2 hours prior to your CT    Dispense:  1 tablet    Refill:  0    Patient Instructions  Medication Instructions:  Your physician recommends that you continue on your current medications as directed. Please refer to the Current Medication list given to you today.  If you need a refill on your cardiac medications before your next appointment, please call your pharmacy.   Lab work: BMET prior to your CT  If you have labs (blood work) drawn today and your tests are completely normal, you will receive your results only by: [name] Kitchen MyChart Message (if you have MyChart) OR . A paper copy in the mail If you have any lab test that is abnormal or we need to change your treatment, we will call you to review the results.  Testing/Procedures: Your physician recommends that you have a Coronary CT performed.  Follow-Up: At [facility], you and your health needs are our priority.  As part of our continuing mission to provide you  with exceptional heart care, we have created designated Provider Care Teams.  These Care Teams include your primary Cardiologist (physician) and Advanced Practice Providers (APPs -  Physician Assistants and Nurse Practitioners) who all work together to provide you with the care you need, when you need it. You will need a follow up appointment in 12 months.  Please call our office 2 months in advance to schedule this appointment.  You may see Dr. Tamala Julian or one of the following Advanced Practice Providers on your designated Care Team:   Truitt Merle, NP Cecilie Kicks, NP . Kathyrn Drown, NP  Any Other Special Instructions Will Be Listed Below (If Applicable).  Your cardiac CT will be scheduled at one of the below locations:   [facility] [address] [place], [place] [address] [phone] [address] [place], [place] [address] [phone]  Please arrive at the [facility] main entrance of [facility] 30-45 minutes prior to test start time. Proceed to the [facility] (first floor) to check-in and test prep.  Please follow these instructions carefully (unless otherwise directed):  Hold all erectile dysfunction medications at least 48 hours prior to test.  On the Night Before the Test: . Be sure to Drink plenty of water. . Do not consume any caffeinated/decaffeinated beverages or chocolate 12 hours prior to your test. . Do not take any antihistamines 12 hours prior to your  test. . If you take Metformin do not take 24 hours prior to test.  On the Day of the Test: . Drink plenty of water. Do not drink any water within one hour of the test. . Do not eat any food 4 hours prior to the test. . You may take your regular medications prior to the test.  . Take metoprolol (Lopressor) two hours prior to test. . HOLD Furosemide/Hydrochlorothiazide morning of the test. . FEMALES-  please wear underwire-free bra if available       After the Test: . Drink plenty of water. . After receiving IV contrast, you may experience a mild flushed feeling. This is normal. . On occasion, you may experience a mild rash up to 24 hours after the test. This is not dangerous. If this occurs, you can take Benadryl 25 mg and increase your fluid intake. . If you experience trouble breathing, this can be serious. If it is severe call 911 IMMEDIATELY. If it is mild, please call our office. . If you take any of these medications: Glipizide/Metformin, Avandament, Glucavance, please do not take 48 hours after completing test.    Please contact the cardiac imaging nurse navigator should you have any questions/concerns Marchia Bond, RN Navigator [facility] [place] [facility] [phone] Office  [phone] Cell        Signed, Sinclair Grooms, MD  01/22/2019 5:18 PM    [place]

## 2019-01-22 ENCOUNTER — Ambulatory Visit (INDEPENDENT_AMBULATORY_CARE_PROVIDER_SITE_OTHER): Payer: Medicare Other | Admitting: Interventional Cardiology

## 2019-01-22 ENCOUNTER — Encounter: Payer: Self-pay | Admitting: Interventional Cardiology

## 2019-01-22 ENCOUNTER — Other Ambulatory Visit: Payer: Self-pay

## 2019-01-22 VITALS — BP 142/80 | HR 69 | Ht 65.0 in | Wt 170.8 lb

## 2019-01-22 DIAGNOSIS — E785 Hyperlipidemia, unspecified: Secondary | ICD-10-CM | POA: Diagnosis not present

## 2019-01-22 DIAGNOSIS — I2510 Atherosclerotic heart disease of native coronary artery without angina pectoris: Secondary | ICD-10-CM | POA: Diagnosis not present

## 2019-01-22 DIAGNOSIS — Z7189 Other specified counseling: Secondary | ICD-10-CM

## 2019-01-22 DIAGNOSIS — I493 Ventricular premature depolarization: Secondary | ICD-10-CM | POA: Diagnosis not present

## 2019-01-22 DIAGNOSIS — Z87891 Personal history of nicotine dependence: Secondary | ICD-10-CM | POA: Diagnosis not present

## 2019-01-22 DIAGNOSIS — J449 Chronic obstructive pulmonary disease, unspecified: Secondary | ICD-10-CM | POA: Diagnosis not present

## 2019-01-22 DIAGNOSIS — C3431 Malignant neoplasm of lower lobe, right bronchus or lung: Secondary | ICD-10-CM

## 2019-01-22 MED ORDER — METOPROLOL TARTRATE 100 MG PO TABS: ORAL_TABLET | ORAL | 0 refills | Status: DC

## 2019-01-22 NOTE — Patient Instructions (Addendum)
Medication Instructions:  Your physician recommends that you continue on your current medications as directed. Please refer to the Current Medication list given to you today.  If you need a refill on your cardiac medications before your next appointment, please call your pharmacy.   Lab work: BMET prior to your CT  If you have labs (blood work) drawn today and your tests are completely normal, you will receive your results only by: [name] Kitchen MyChart Message (if you have MyChart) OR . A paper copy in the mail If you have any lab test that is abnormal or we need to change your treatment, we will call you to review the results.  Testing/Procedures: Your physician recommends that you have a Coronary CT performed.  Follow-Up: At [facility], you and your health needs are our priority.  As part of our continuing mission to provide you with exceptional heart care, we have created designated Provider Care Teams.  These Care Teams include your primary Cardiologist (physician) and Advanced Practice Providers (APPs -  Physician Assistants and Nurse Practitioners) who all work together to provide you with the care you need, when you need it. You will need a follow up appointment in 12 months.  Please call our office 2 months in advance to schedule this appointment.  You may see Dr. Tamala Julian or one of the following Advanced Practice Providers on your designated Care Team:   Truitt Merle, NP Cecilie Kicks, NP . Kathyrn Drown, NP  Any Other Special Instructions Will Be Listed Below (If Applicable).  Your cardiac CT will be scheduled at one of the below locations:   [facility] [address] [place], [place] [address] (336) [place] [address] [place], [place] [address] [phone]  Please arrive at the [facility] main entrance of [facility] 30-45 minutes prior to test start time. Proceed to the [facility]  Radiology Department (first floor) to check-in and test prep.  Please follow these instructions carefully (unless otherwise directed):  Hold all erectile dysfunction medications at least 48 hours prior to test.  On the Night Before the Test: . Be sure to Drink plenty of water. . Do not consume any caffeinated/decaffeinated beverages or chocolate 12 hours prior to your test. . Do not take any antihistamines 12 hours prior to your test. . If you take Metformin do not take 24 hours prior to test.  On the Day of the Test: . Drink plenty of water. Do not drink any water within one hour of the test. . Do not eat any food 4 hours prior to the test. . You may take your regular medications prior to the test.  . Take metoprolol (Lopressor) two hours prior to test. . HOLD Furosemide/Hydrochlorothiazide morning of the test. . FEMALES- please wear underwire-free bra if available       After the Test: . Drink plenty of water. . After receiving IV contrast, you may experience a mild flushed feeling. This is normal. . On occasion, you may experience a mild rash up to 24 hours after the test. This is not dangerous. If this occurs, you can take Benadryl 25 mg and increase your fluid intake. . If you experience trouble breathing, this can be serious. If it is severe call 911 IMMEDIATELY. If it is mild, please call our office. . If you take any of these medications: Glipizide/Metformin, Avandament, Glucavance, please do not take 48 hours after completing test.  Please contact the cardiac imaging nurse navigator should you have any questions/concerns Marchia Bond, RN Navigator Cardiac Imaging [place] [facility] [phone] Office  [phone] Cell

## 2019-01-23 DIAGNOSIS — D225 Melanocytic nevi of trunk: Secondary | ICD-10-CM | POA: Diagnosis not present

## 2019-01-23 DIAGNOSIS — D1801 Hemangioma of skin and subcutaneous tissue: Secondary | ICD-10-CM | POA: Diagnosis not present

## 2019-01-23 DIAGNOSIS — L821 Other seborrheic keratosis: Secondary | ICD-10-CM | POA: Diagnosis not present

## 2019-01-23 DIAGNOSIS — L812 Freckles: Secondary | ICD-10-CM | POA: Diagnosis not present

## 2019-01-24 ENCOUNTER — Ambulatory Visit (HOSPITAL_COMMUNITY): Payer: Medicare Other

## 2019-01-24 ENCOUNTER — Inpatient Hospital Stay: Payer: Medicare Other | Attending: Internal Medicine

## 2019-01-24 ENCOUNTER — Ambulatory Visit (HOSPITAL_COMMUNITY): Payer: Managed Care, Other (non HMO)

## 2019-01-24 ENCOUNTER — Ambulatory Visit
Admission: RE | Admit: 2019-01-24 | Discharge: 2019-01-24 | Disposition: A | Discharge status: Home / Self Care | Payer: Medicare Other | Source: Ambulatory Visit | Attending: Internal Medicine | Admitting: Internal Medicine

## 2019-01-24 ENCOUNTER — Other Ambulatory Visit: Payer: Self-pay

## 2019-01-24 DIAGNOSIS — J432 Centrilobular emphysema: Secondary | ICD-10-CM | POA: Diagnosis not present

## 2019-01-24 DIAGNOSIS — C3490 Malignant neoplasm of unspecified part of unspecified bronchus or lung: Secondary | ICD-10-CM

## 2019-01-24 DIAGNOSIS — Z85118 Personal history of other malignant neoplasm of bronchus and lung: Secondary | ICD-10-CM | POA: Insufficient documentation

## 2019-01-24 LAB — CBC WITH DIFFERENTIAL (CANCER CENTER ONLY)
Abs Immature Granulocytes: 0.01 10*3/uL (ref 0.00–0.07)
Basophils Absolute: 0.1 10*3/uL (ref 0.0–0.1)
Basophils Relative: 1 %
Eosinophils Absolute: 0.2 10*3/uL (ref 0.0–0.5)
Eosinophils Relative: 3 %
HCT: 42.1 % (ref 36.0–46.0)
Hemoglobin: 13.8 g/dL (ref 12.0–15.0)
Immature Granulocytes: 0 %
Lymphocytes Relative: 23 %
Lymphs Abs: 1.3 10*3/uL (ref 0.7–4.0)
MCH: 29.6 pg (ref 26.0–34.0)
MCHC: 32.8 g/dL (ref 30.0–36.0)
MCV: 90.3 fL (ref 80.0–100.0)
Monocytes Absolute: 0.5 10*3/uL (ref 0.1–1.0)
Monocytes Relative: 8 %
Neutro Abs: 3.9 10*3/uL (ref 1.7–7.7)
Neutrophils Relative %: 65 %
Platelet Count: 235 10*3/uL (ref 150–400)
RBC: 4.66 MIL/uL (ref 3.87–5.11)
RDW: 12.6 % (ref 11.5–15.5)
WBC Count: 5.9 10*3/uL (ref 4.0–10.5)
nRBC: 0 % (ref 0.0–0.2)

## 2019-01-24 LAB — CMP (CANCER CENTER ONLY)
ALT: 16 U/L (ref 0–44)
AST: 17 U/L (ref 15–41)
Albumin: 4 g/dL (ref 3.5–5.0)
Alkaline Phosphatase: 114 U/L (ref 38–126)
Anion gap: 9 (ref 5–15)
BUN: 15 mg/dL (ref 8–23)
CO2: 22 mmol/L (ref 22–32)
Calcium: 9.3 mg/dL (ref 8.9–10.3)
Chloride: 108 mmol/L (ref 98–111)
Creatinine: 0.8 mg/dL (ref 0.44–1.00)
GFR, Est AFR Am: >60 mL/min (ref >60)
GFR, Estimated: >60 mL/min (ref >60)
Glucose, Bld: 103 mg/dL — ABNORMAL HIGH (ref 70–99)
Potassium: 4.2 mmol/L (ref 3.5–5.1)
Sodium: 139 mmol/L (ref 135–145)
Total Bilirubin: 0.8 mg/dL (ref 0.3–1.2)
Total Protein: 7 g/dL (ref 6.5–8.1)

## 2019-01-24 MED ORDER — IOPAMIDOL (ISOVUE-300) INJECTION 61%
75.0000 mL | Freq: Once | INTRAVENOUS | Status: AC | PRN
  Administered 2019-01-24: 75 mL via INTRAVENOUS

## 2019-01-31 ENCOUNTER — Telehealth: Payer: Self-pay | Admitting: Internal Medicine

## 2019-01-31 ENCOUNTER — Other Ambulatory Visit: Payer: Self-pay

## 2019-01-31 ENCOUNTER — Encounter: Payer: Self-pay | Admitting: Internal Medicine

## 2019-01-31 ENCOUNTER — Inpatient Hospital Stay (HOSPITAL_BASED_OUTPATIENT_CLINIC_OR_DEPARTMENT_OTHER): Payer: Medicare Other | Admitting: Internal Medicine

## 2019-01-31 VITALS — BP 142/88 | HR 80 | Temp 98.0°F | Resp 17 | Ht 65.0 in | Wt 167.8 lb

## 2019-01-31 DIAGNOSIS — C3490 Malignant neoplasm of unspecified part of unspecified bronchus or lung: Secondary | ICD-10-CM | POA: Diagnosis not present

## 2019-01-31 DIAGNOSIS — Z85118 Personal history of other malignant neoplasm of bronchus and lung: Secondary | ICD-10-CM | POA: Diagnosis not present

## 2019-01-31 DIAGNOSIS — I2510 Atherosclerotic heart disease of native coronary artery without angina pectoris: Secondary | ICD-10-CM

## 2019-01-31 DIAGNOSIS — C3431 Malignant neoplasm of lower lobe, right bronchus or lung: Secondary | ICD-10-CM | POA: Diagnosis not present

## 2019-01-31 NOTE — Telephone Encounter (Signed)
Scheduled appt per 8/26 los.  Printed calendar and avs.

## 2019-01-31 NOTE — Progress Notes (Signed)
[place] [phone]) [phone]   Fax:(336) [phone]  OFFICE PROGRESS NOTE  [name] [name], [place] [address]  DIAGNOSIS: Primary cancer of right lower lobe of lung  Staging form: Lung, AJCC 7th Edition  Clinical: Stage IIIA (T2a, N2, M0) - Unsigned  Staging comments: Adenocarcinoma  Foundation 1 molecular studies: positive for [id number], KRASG13C, [id number]*, [id number]*11, GATA6 amplification and [id number] Negative for BRAF, ALK, MET, RET and ERBB2  PRIOR THERAPY:  1) Concurrent chemoradiation with chemotherapy in the form of weekly carboplatin for an AUC of 2 and paclitaxel 45 mg/m given concurrent with radiation 2) Right VATS (video-assisted thoracoscopic surgery) with right lower lobectomy and mediastinal lymph node dissection under the care of [name] 07/23/2014. The final pathology showed residual 1.8 cm moderate to poorly differentiated invasive adenocarcinoma with no pleural or lymphovascular invasion. The dissected lymph nodes were negative for malignancy and the final pathologic stage was (pT1a, pN0, pMx).  CURRENT THERAPY: Observation.   INTERVAL HISTORY: [name] [place]89y.o. female returns to the clinic today for follow-up visit.  The patient is feeling fine today with no concerning complaints except for occasional shortness of breath especially in the morning or when she goes upstairs.  She is followed by cardiology Dr. STamala Julianand scheduled for CT of the coronaries in a few weeks.  She denied having any current chest pain, cough or hemoptysis.  She denied having any weight loss or night sweats.  She has no nausea, vomiting, diarrhea or constipation.  She had repeat CT scan of the chest performed recently and she is here for evaluation and discussion of her scan results.  MEDICAL HISTORY: Past Medical History:  Diagnosis Date   Anxiety    [name] [name]   Cancer ([place]    lung    Complication of anesthesia    bronchospasms post-op (pulmonary did consult but [name] was discharged later that day)  With last surg. she was given albuterol inhaler & had steroid with surg.     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  MEDICATIONS:  Current Outpatient Medications  Medication Sig Dispense Refill   albuterol (PROAIR HFA) 108 (90 Base) MCG/ACT inhaler Inhale 1 puff into the lungs 3 (three) times daily as needed for wheezing or shortness of breath. 1 Inhaler 2   aspirin EC 81 MG tablet Take 1 tablet (81 mg total) by mouth daily. 90 tablet 3   metoprolol tartrate (LOPRESSOR) 100 MG tablet Take one  tablet by mouth 2 hours prior to your CT 1 tablet 0   rosuvastatin (CRESTOR) 10 MG tablet Take 1 tablet (10 mg total) by mouth daily. 90 tablet 0   verapamil (CALAN-SR) 240 MG CR tablet Take 1 tablet (240 mg total) by mouth at bedtime. 90 tablet 0   Vitamin D, Ergocalciferol, (DRISDOL) 50000 units CAPS capsule Take 50,000 Units by mouth every 7 (seven) days.     Current Facility-Administered Medications  Medication  Dose Route Frequency Provider Last Rate Last Dose   Ampicillin-Sulbactam (UNASYN) 3 g in sodium chloride 0.9 % 100 mL IVPB  3 g Intravenous Once Avelina Laine, PA-C        SURGICAL HISTORY:  Past Surgical History:  Procedure Laterality Date   CARPAL TUNNEL RELEASE Left    CESAREAN SECTION     COLONOSCOPY W/ BIOPSIES AND POLYPECTOMY     DILATION AND CURETTAGE OF UTERUS     TONSILLECTOMY     VARICOSE VEIN SURGERY     left   VIDEO ASSISTED THORACOSCOPY (VATS)/ LOBECTOMY Right 07/23/2014   Procedure: VIDEO ASSISTED THORACOSCOPY (VATS)/ LOBECTOMY;  Surgeon: Ivin Poot, MD;  Location: [place];  Service: Thoracic;  Laterality: Right;   VIDEO BRONCHOSCOPY N/A 07/23/2014   Procedure: VIDEO BRONCHOSCOPY;  Surgeon: Ivin Poot, MD;  Location: [facility] OR;  Service: Thoracic;  Laterality: N/A;   VIDEO BRONCHOSCOPY WITH ENDOBRONCHIAL ULTRASOUND Right 04/17/2014   Procedure: VIDEO BRONCHOSCOPY WITH ENDOBRONCHIAL ULTRASOUND;  Surgeon: Collene Gobble, MD;  Location: [facility];  Service: Thoracic;  Laterality: Right;   VIDEO BRONCHOSCOPY WITH ENDOBRONCHIAL ULTRASOUND N/A 07/08/2014   Procedure: VIDEO BRONCHOSCOPY WITH ENDOBRONCHIAL ULTRASOUND;  Surgeon: Ivin Poot, MD;  Location: [facility] OR;  Service: Thoracic;  Laterality: N/A;    REVIEW OF SYSTEMS:  A comprehensive review of systems was negative.   PHYSICAL EXAMINATION: General appearance: alert, cooperative and no distress Head: Normocephalic, without obvious abnormality, atraumatic Neck: no adenopathy, no JVD, supple, symmetrical, trachea midline and thyroid not enlarged, symmetric, no tenderness/mass/nodules Lymph nodes: Cervical, supraclavicular, and axillary nodes normal. Resp: clear to auscultation bilaterally Back: symmetric, no curvature. ROM normal. No CVA tenderness. Cardio: regular rate and rhythm, S1, S2 normal, no murmur, click, rub or gallop GI: soft, non-tender; bowel sounds normal; no masses,  no organomegaly Extremities:  extremities normal, atraumatic, no cyanosis or edema  ECOG PERFORMANCE STATUS: 0 - Asymptomatic  Blood pressure (!) 142/88, pulse 80, temperature 98 F (36.7 C), temperature source Temporal, resp. rate 17, height 5' 5"  (1.651 m), weight 167 lb 12.8 oz (76.1 kg), SpO2 98 %.  LABORATORY DATA: Lab Results  Component Value Date   WBC 5.9 01/24/2019   HGB 13.8 01/24/2019   HCT 42.1 01/24/2019   MCV 90.3 01/24/2019   PLT 235 01/24/2019      Chemistry      Component Value Date/Time   NA 139 01/24/2019 1050   NA 142 01/19/2017 0958   K 4.2 01/24/2019 1050   K 4.3 01/19/2017 0958   CL 108 01/24/2019 1050   CO2 22 01/24/2019 1050   CO2 25 01/19/2017 0958   BUN 15 01/24/2019 1050   BUN 15.2 01/19/2017 0958   CREATININE 0.80 01/24/2019 1050   CREATININE 0.8 01/19/2017 0958      Component Value Date/Time   CALCIUM 9.3 01/24/2019 1050   CALCIUM 9.5 01/19/2017 0958   ALKPHOS 114 01/24/2019 1050   ALKPHOS 121 01/19/2017 0958   AST 17 01/24/2019 1050   AST 19 01/19/2017  0958   ALT 16 01/24/2019 1050   ALT 20 01/19/2017 0958   BILITOT 0.8 01/24/2019 1050   BILITOT 0.64 01/19/2017 0958       RADIOGRAPHIC STUDIES: Ct Chest W Contrast  Result Date: 01/24/2019 CLINICAL DATA:  Non-small-cell lung cancer. EXAM: CT CHEST WITH CONTRAST TECHNIQUE: Multidetector CT imaging of the chest was performed during intravenous contrast administration. CONTRAST:  82m ISOVUE-300 IOPAMIDOL (ISOVUE-300) INJECTION 61% COMPARISON:  07/26/2018 FINDINGS: Cardiovascular: The heart size is normal. No substantial pericardial effusion. Coronary artery calcification is evident. No thoracic aortic aneurysm. Mediastinum/Nodes: 12 mm right paratracheal node (43/2) is stable. No other mediastinal or hilar lymphadenopathy evident. The esophagus has normal imaging features. There is no axillary lymphadenopathy. Lungs/Pleura: Centrilobular emphsyema noted. Status post right lower lobectomy. No pleural effusion.  Subsegmental atelectasis or scarring noted in the lingula, stable. Upper Abdomen: Small cyst lateral segment left liver is stable. No adrenal nodule or mass. Musculoskeletal: No worrisome lytic or sclerotic osseous abnormality. IMPRESSION: 1. Stable exam. No new or progressive findings to suggest recurrent or metastatic disease in the thorax. 2.  Emphysema. (([id number]) Electronically Signed   By: EMisty StanleyM.D.   On: 01/24/2019 14:20    ASSESSMENT AND PLAN:  This is a very pleasant 63years old white female with a stage IIIA non-small cell lung cancer status post neoadjuvant concurrent chemoradiation with weekly carboplatin and paclitaxel followed by right lower lobectomy and lymph node dissection with the final pathology consistent with a pathological stage pT1a, pN0. She is currently on observation and has no concerning complaints except for occasional shortness of breath. The patient had repeat CT scan of the chest performed recently.  I personally and independently reviewed the scans and discussed the result with the patient today. Her scan showed no concerning findings for disease recurrence. I recommended for her to continue on observation with repeat CT scan of the chest in 6 months. She was advised to call immediately if she has any concerning symptoms in the interval. The patient voices understanding of current disease status and treatment options and is in agreement with the current care plan. All questions were answered. The patient knows to call the clinic with any problems, questions or concerns. We can certainly see the patient much sooner if necessary. I spent 10 minutes counseling the patient face to face. The total time spent in the appointment was 15 minutes.  Disclaimer: This note was dictated with voice recognition software. Similar sounding words can inadvertently be transcribed and may not be corrected upon review.

## 2019-02-06 ENCOUNTER — Telehealth (HOSPITAL_COMMUNITY): Payer: Self-pay | Admitting: Emergency Medicine

## 2019-02-06 NOTE — Telephone Encounter (Signed)
Left message on voicemail with name and callback number Aunika Kirsten RN Navigator Cardiac Imaging [facility] [phone] Office [phone] Cell  

## 2019-02-07 ENCOUNTER — Ambulatory Visit (HOSPITAL_COMMUNITY)
Admission: RE | Admit: 2019-02-07 | Discharge: 2019-02-07 | Disposition: A | Discharge status: Home / Self Care | Payer: Medicare Other | Source: Ambulatory Visit | Attending: Interventional Cardiology | Admitting: Interventional Cardiology

## 2019-02-07 ENCOUNTER — Other Ambulatory Visit: Payer: Self-pay

## 2019-02-07 DIAGNOSIS — I2510 Atherosclerotic heart disease of native coronary artery without angina pectoris: Secondary | ICD-10-CM

## 2019-02-07 DIAGNOSIS — I493 Ventricular premature depolarization: Secondary | ICD-10-CM | POA: Diagnosis not present

## 2019-02-07 DIAGNOSIS — E785 Hyperlipidemia, unspecified: Secondary | ICD-10-CM | POA: Diagnosis not present

## 2019-02-07 MED ORDER — IOHEXOL 350 MG/ML SOLN
100.0000 mL | Freq: Once | INTRAVENOUS | Status: AC | PRN
  Administered 2019-02-07: 100 mL via INTRAVENOUS

## 2019-02-07 MED ORDER — NITROGLYCERIN 0.4 MG SL SUBL
0.8000 mg | SUBLINGUAL_TABLET | SUBLINGUAL | Status: DC | PRN
  Administered 2019-02-07: 0.8 mg via SUBLINGUAL
  Filled 2019-02-07 (×2): qty 25

## 2019-02-07 MED ORDER — NITROGLYCERIN 0.4 MG SL SUBL
SUBLINGUAL_TABLET | SUBLINGUAL | Status: AC
  Filled 2019-02-07: qty 2

## 2019-02-08 DIAGNOSIS — I2510 Atherosclerotic heart disease of native coronary artery without angina pectoris: Secondary | ICD-10-CM | POA: Diagnosis not present

## 2019-02-09 ENCOUNTER — Other Ambulatory Visit: Payer: Self-pay

## 2019-02-09 ENCOUNTER — Telehealth: Payer: Self-pay

## 2019-02-09 MED ORDER — ROSUVASTATIN CALCIUM 10 MG PO TABS: 20.0000 mg | ORAL_TABLET | Freq: Every day | ORAL | 3 refills | Status: DC

## 2019-02-09 NOTE — Telephone Encounter (Signed)
Pt called back returning Michael's call

## 2019-02-09 NOTE — Telephone Encounter (Signed)
Notes recorded by Frederik Schmidt, RN on 02/09/2019 at 3:38 PM EDT  The patient has been notified of the result and verbalized understanding. All questions (if any) were answered.  Frederik Schmidt, RN 02/09/2019 3:38 PM   The patient would like a call from Anderson Malta to possibly arrange a visit with Dr Tamala Julian to further discuss cath/options.  Please reach out to patient.  Thank you

## 2019-02-09 NOTE — Telephone Encounter (Signed)
Notes recorded by Frederik Schmidt, RN on 02/09/2019 at 9:43 AM EDT  lpmtcb 9/4  ------

## 2019-02-10 ENCOUNTER — Telehealth: Payer: Self-pay | Admitting: Interventional Cardiology

## 2019-02-10 NOTE — Telephone Encounter (Signed)
I spoke to [place] and feel she needs to see me in 2-4 weeks. She needs SL NTG sent to her Pharmacy, ? [facility] on [address]. Start Metoprolol succinate 12.5 mg daily.

## 2019-02-13 MED ORDER — ROSUVASTATIN CALCIUM 20 MG PO TABS: 20.0000 mg | ORAL_TABLET | Freq: Every day | ORAL | 3 refills | Status: DC

## 2019-02-13 MED ORDER — NITROGLYCERIN 0.4 MG SL SUBL: 0.4000 mg | SUBLINGUAL_TABLET | SUBLINGUAL | 3 refills | Status: DC | PRN

## 2019-02-13 NOTE — Telephone Encounter (Signed)
Spoke with pt and went over recommendations.  Pt wanted to know why she was being started on Metoprolol?  States she has never been on it before other than a one time dose in preparation for her CT.  Advised I will send to Dr. Tamala Julian for review.

## 2019-02-15 NOTE — Telephone Encounter (Signed)
Spoke with pt and went over recommendations per Dr. Tamala Julian.  Pt states she would like to hold off on starting Metoprolol for now and discuss when she comes to see Dr. Tamala Julian on 10/6.

## 2019-02-15 NOTE — Telephone Encounter (Signed)
Left message to call back  

## 2019-02-15 NOTE — Telephone Encounter (Signed)
Risk reduction in setting of CAD. Beta blocker add independent protection against events.

## 2019-03-12 NOTE — Progress Notes (Signed)
Cardiology Office Note:    Date:  03/13/2019   ID:  [name] [name], DOB 1956-05-25, MRN [id number]  PCP:  [name], MD  Cardiologist:  No primary care provider on file.   Referring MD: [name], MD   Chief Complaint  Patient presents with  . Coronary Artery Disease  . Shortness of Breath    History of Present Illness:    [name] [name] is a 63 y.o. female with a hx of asymptomatic CAD denoted by coronary calcification with obstructive RCA disease by Cor CTA, hyperlipidemia, hypertension, and prior smoking history. S/p surgical and radiation therapy of lung cancer.  [name] [name] complains of a 63-month history of exertional fatigue.  She fatigues easily and needs to rest.  There is no associated pain.  The reason for resting is not only related to shortness of breath although there is some especially walking up an incline.  Because of these symptoms she underwent a coronary CT angiogram with morphology which demonstrated borderline significant proximal right coronary.  The right coronary is a small [place] of her 3 coronaries.  LAD and circumflex appeared widely patent.  The lesion in the right coronary would be treatable with percutaneous therapy.  I gave nitroglycerin and recommended metoprolol.  She purchased the nitroglycerin.  She has not used nitroglycerin.  She did not pick up the metoprolol because she is concerned it will cause her fatigue to be worse and she is concerned about her breathing since she has moderate COPD.   Past Medical History:  Diagnosis Date  . Anxiety    husband also cancer pt  . Cancer ([place])    lung  . Complication of anesthesia    bronchospasms post-op (pulmonary did consult but pt was discharged later that day)  With last surg. she was given albuterol inhaler & had steroid with surg.    . Congenital renal atrophy    left kidney, one spontaneous stone passed   . COPD (chronic obstructive pulmonary disease) ([place])   . Coronary artery disease   .  Degenerative arthritis    spine, hands & knees   . GERD (gastroesophageal reflux disease)   . History of migraine   . Hypertension    h/o PVC- asymptomatic   . Memory disorder 03/29/2017  . 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 PONV (postoperative nausea and vomiting)   . PVC (premature ventricular contraction)   . Shortness of breath dyspnea    with exertion  . Thyroid nodule    benign by biopsy  . Varicose veins    left leg  . Visual disturbance    Episodic  . Vitamin D deficiency   . Wears glasses     Past Surgical History:  Procedure Laterality Date  . CARPAL TUNNEL RELEASE Left   . CESAREAN SECTION    . COLONOSCOPY W/ BIOPSIES AND POLYPECTOMY    . DILATION AND CURETTAGE OF UTERUS    . TONSILLECTOMY    . VARICOSE VEIN SURGERY     left  . VIDEO ASSISTED THORACOSCOPY (VATS)/ LOBECTOMY Right 07/23/2014   Procedure: VIDEO ASSISTED THORACOSCOPY (VATS)/ LOBECTOMY;  Surgeon: Ivin Poot, MD;  Location: [facility];  Service: Thoracic;  Laterality: Right;  [name] Kitchen VIDEO BRONCHOSCOPY N/A 07/23/2014   Procedure: VIDEO BRONCHOSCOPY;  Surgeon: Ivin Poot, MD;  Location: [facility];  Service: Thoracic;  Laterality: N/A;  . VIDEO BRONCHOSCOPY WITH ENDOBRONCHIAL ULTRASOUND Right 04/17/2014  Procedure: VIDEO BRONCHOSCOPY WITH ENDOBRONCHIAL ULTRASOUND;  Surgeon: Collene Gobble, MD;  Location: [facility];  Service: Thoracic;  Laterality: Right;  [name] Kitchen VIDEO BRONCHOSCOPY WITH ENDOBRONCHIAL ULTRASOUND N/A 07/08/2014   Procedure: VIDEO BRONCHOSCOPY WITH ENDOBRONCHIAL ULTRASOUND;  Surgeon: Ivin Poot, MD;  Location[facility];  Service: Thoracic;  Laterality: N/A;    Current Medications: Current Meds  Medication Sig  . albuterol (PROAIR HFA) 108 (90 Base) MCG/ACT inhaler Inhale 1 puff into the lungs 3 (three) times daily as needed for wheezing or shortness of breath.  [name] Kitchen aspirin EC 81 MG tablet Take 1 tablet (81 mg total) by mouth  daily.  . nitroGLYCERIN (NITROSTAT) 0.4 MG SL tablet Place 1 tablet (0.4 mg total) under the tongue every 5 (five) minutes as needed for chest pain.  . rosuvastatin (CRESTOR) 20 MG tablet Take 1 tablet (20 mg total) by mouth daily.  . verapamil (CALAN-SR) 240 MG CR tablet Take 1 tablet (240 mg total) by mouth at bedtime.  . Vitamin D, Ergocalciferol, (DRISDOL) 50000 units CAPS capsule Take 50,000 Units by mouth every 7 (seven) days.   Current Facility-Administered Medications for the 03/13/19 encounter (Office Visit) with Belva Crome, MD  Medication  . Ampicillin-Sulbactam (UNASYN) 3 g in sodium chloride 0.9 % 100 mL IVPB     Allergies:   Advil [ibuprofen], Hydrocodone-guaifenesin, Sulfa antibiotics, Temazepam, Plasticized base [plastibase], and Zofran [ondansetron hcl]   Social History   Socioeconomic History  . Marital status: Widowed    Spouse name: Not on file  . Number of children: 1  . Years of education: 16  . Highest education level: Not on file  Occupational History  . Occupation: Nurse    Employer: [employer]  Social Needs  . Financial resource strain: Not on file  . Food insecurity    Worry: Not on file    Inability: Not on file  . Transportation needs    Medical: Not on file    Non-medical: Not on file  Tobacco Use  . Smoking status: Former Smoker    Packs/day: 0.50    Years: 40.00    Pack years: 20.00    Types: Cigarettes    Quit date: 03/17/2013    Years since quitting: 5.9  . Smokeless tobacco: Never Used  Substance and Sexual Activity  . Alcohol use: Yes    Alcohol/week: 0.0 standard drinks    Comment: " rare"  . Drug use: No  . Sexual activity: Not on file  Lifestyle  . Physical activity    Days per week: Not on file    Minutes per session: Not on file  . Stress: Not on file  Relationships  . Social Herbalist on phone: Not on file    Gets together: Not on file    Attends religious service: Not on file    Active member of  club or organization: Not on file    Attends meetings of clubs or organizations: Not on file    Relationship status: Not on file  Other Topics Concern  . Not on file  Social History Narrative   Lives alone. Husband passed away 23-Sep-2016   Caffeine use: 1-2 cups coffee per day   Right handed      Family History: The patient's family history includes Atrial fibrillation in her father; Cancer in her mother; Diverticulitis in her brother and father; Headache in her sister; Hypertension in her father, sister, and sister.  ROS:   Please see the  history of present illness.    Fatigue.  She states that she gets at least 6 hours of sleep per night.  Fatigue interferes with her ability to do chores around her house.  She occasionally has discomfort in the chest.  She is still not tried nitroglycerin to see if there is an impact on the length of the discomfort.  All other systems reviewed and are negative.  EKGs/Labs/Other Studies Reviewed:    The following studies were reviewed today:  Coronary CT FFR September 2020:  1. Left Main:  No significant stenosis. FFR =.97  2. LAD: No significant stenosis. Proximal FFR =.95, Mid FFR =.90, Distal FFR =.90 3. LCX: No significant stenosis. Proximal FFR =.96, Distal FFR =.84; OM2 =.90 4. RCA: Significant stenosis. Proximal FFR =.99, Mid FFR =.76, Distal FFR =.74  IMPRESSION: 1. CT FFR analysis shows mid and distal RCA =.74 to .76. suggesting moderate stenosis in RCA noted on CTA is significant and consistent with ischemia.  EKG:  EKG not repeated  Recent Labs: 01/24/2019: ALT 16; BUN 15; Creatinine 0.80; Hemoglobin 13.8; Platelet Count 235; Potassium 4.2; Sodium 139  Recent Lipid Panel    Component Value Date/Time   CHOL 139 10/11/2017 1023   TRIG 54 10/11/2017 1023   HDL 73 10/11/2017 1023   CHOLHDL 1.9 10/11/2017 1023   LDLCALC 55 10/11/2017 1023    Physical Exam:    VS:  BP 138/84   Pulse 77   Ht 5\' 5"  (1.651 m)   Wt 169 lb 3.2  oz (76.7 kg)   SpO2 97%   BMI 28.16 kg/m     Wt Readings from Last 3 Encounters:  03/13/19 169 lb 3.2 oz (76.7 kg)  01/31/19 167 lb 12.8 oz (76.1 kg)  01/22/19 170 lb 12.8 oz (77.5 kg)     GEN: Mildly overweight. No acute distress HEENT: Normal NECK: No JVD. EXTREMITIES: No edema NEUROLOGICAL EXAM: Normal  ASSESSMENT:    1. Coronary artery disease of native artery of native heart with stable angina pectoris ([place])   2. Hyperlipidemia with target LDL less than 70   3. Chronic obstructive pulmonary disease, unspecified COPD type ([place])   4. Frequent PVCs   5. Hx of tobacco use, presenting hazards to health   6. Educated about COVID-19 virus infection    PLAN:    In order of problems listed above:  1. Moderately severe right coronary disease.  RCA is the smallest of the 3 coronaries.  Up titration of statin therapy to high intensity has been achieved.  She refuses to take beta-blocker therapy and has not tried nitroglycerin.  She really has had no symptom for which she feels nitroglycerin would have been helpful. 2. LDL target less than 55.  Lipid panel will be obtained in November.  Rosuvastatin increased from 10 to 20 mg/day. 3. It is conceivable that the exertional fatigue and dyspnea are related to COPD.  I have asked her to use the rescue inhaler to see if this increases endurance.  Also believes she needs to get back with Dr. Lamonte Sakai and get his thoughts on the exertional fatigue and whether or not the lung could be helping to produce this complaint. 4. Not discussed 5. Abstinence from cigarettes now for greater than 6 years. 6. The 3W's were discussed and are endorsed as daily practices.  Overall education and awareness concerning primary/secondary risk prevention was discussed in detail: LDL less than 70, hemoglobin A1c less than 7, blood pressure target less than 130/80  mmHg, >150 minutes of moderate aerobic activity per week, avoidance of smoking, weight control (via diet and  exercise), and continued surveillance/management of/for obstructive sleep apnea.  We may end up performing coronary angiography.  The right coronary disease is moderate to moderately severe and was seem to be an unlikely explanation for exertional fatigue without associated angina.  We will see how she does over the next several weeks.  She will also get a pulmonary follow-up.  I have asked her to use the rescue inhaler before physical activity of any significant duration to see if this will improve her stamina.    Medication Adjustments/Labs and Tests Ordered: Current medicines are reviewed at length with the patient today.  Concerns regarding medicines are outlined above.  Orders Placed This Encounter  Procedures  . Hepatic function panel  . Lipid panel   No orders of the defined types were placed in this encounter.   Patient Instructions  Medication Instructions:  Your physician recommends that you continue on your current medications as directed. Please refer to the Current Medication list given to you today.  If you need a refill on your cardiac medications before your next appointment, please call your pharmacy.   Lab work: Lipid and Liver in mid November.  You will need to be fasting for these labs.    If you have labs (blood work) drawn today and your tests are completely normal, you will receive your results only by: [name] Kitchen MyChart Message (if you have MyChart) OR . A paper copy in the mail If you have any lab test that is abnormal or we need to change your treatment, we will call you to review the results.  Testing/Procedures: None  Follow-Up: At [facility], you and your health needs are our priority.  As part of our continuing mission to provide you with exceptional heart care, we have created designated Provider Care Teams.  These Care Teams include your primary Cardiologist (physician) and Advanced Practice Providers (APPs -  Physician Assistants and Nurse  Practitioners) who all work together to provide you with the care you need, when you need it. You will need a follow up appointment in 6 months.  Please call our office 2 months in advance to schedule this appointment.  You may see Dr. Daneen Schick or one of the following Advanced Practice Providers on your designated Care Team:   Truitt Merle, NP Cecilie Kicks, NP . Kathyrn Drown, NP  Any Other Special Instructions Will Be Listed Below (If Applicable).       Signed, Sinclair Grooms, MD  03/13/2019 12:45 PM    [place]

## 2019-03-13 ENCOUNTER — Encounter: Payer: Self-pay | Admitting: Interventional Cardiology

## 2019-03-13 ENCOUNTER — Ambulatory Visit (INDEPENDENT_AMBULATORY_CARE_PROVIDER_SITE_OTHER): Payer: Medicare Other | Admitting: Interventional Cardiology

## 2019-03-13 ENCOUNTER — Other Ambulatory Visit: Payer: Self-pay

## 2019-03-13 VITALS — BP 138/84 | HR 77 | Ht 65.0 in | Wt 169.2 lb

## 2019-03-13 DIAGNOSIS — Z87891 Personal history of nicotine dependence: Secondary | ICD-10-CM | POA: Diagnosis not present

## 2019-03-13 DIAGNOSIS — I2510 Atherosclerotic heart disease of native coronary artery without angina pectoris: Secondary | ICD-10-CM | POA: Diagnosis not present

## 2019-03-13 DIAGNOSIS — J449 Chronic obstructive pulmonary disease, unspecified: Secondary | ICD-10-CM | POA: Diagnosis not present

## 2019-03-13 DIAGNOSIS — Z7189 Other specified counseling: Secondary | ICD-10-CM | POA: Diagnosis not present

## 2019-03-13 DIAGNOSIS — I25118 Atherosclerotic heart disease of native coronary artery with other forms of angina pectoris: Secondary | ICD-10-CM | POA: Diagnosis not present

## 2019-03-13 DIAGNOSIS — E785 Hyperlipidemia, unspecified: Secondary | ICD-10-CM | POA: Diagnosis not present

## 2019-03-13 DIAGNOSIS — I493 Ventricular premature depolarization: Secondary | ICD-10-CM

## 2019-03-13 NOTE — Patient Instructions (Signed)
Medication Instructions:  Your physician recommends that you continue on your current medications as directed. Please refer to the Current Medication list given to you today.  If you need a refill on your cardiac medications before your next appointment, please call your pharmacy.   Lab work: Lipid and Liver in mid November.  You will need to be fasting for these labs.    If you have labs (blood work) drawn today and your tests are completely normal, you will receive your results only by: [name] Kitchen MyChart Message (if you have MyChart) OR . A paper copy in the mail If you have any lab test that is abnormal or we need to change your treatment, we will call you to review the results.  Testing/Procedures: None  Follow-Up: At [facility], you and your health needs are our priority.  As part of our continuing mission to provide you with exceptional heart care, we have created designated Provider Care Teams.  These Care Teams include your primary Cardiologist (physician) and Advanced Practice Providers (APPs -  Physician Assistants and Nurse Practitioners) who all work together to provide you with the care you need, when you need it. You will need a follow up appointment in 6 months.  Please call our office 2 months in advance to schedule this appointment.  You may see Dr. Daneen Schick or one of the following Advanced Practice Providers on your designated Care Team:   Truitt Merle, NP Cecilie Kicks, NP . Kathyrn Drown, NP  Any Other Special Instructions Will Be Listed Below (If Applicable).

## 2019-03-14 ENCOUNTER — Ambulatory Visit: Payer: Medicare Other | Admitting: Interventional Cardiology

## 2019-03-15 NOTE — Progress Notes (Signed)
Discharge Progress Report  Patient Details  Name: [name] [name] MRN: [id number] Date of Birth: 1956-04-27 Referring Provider:     Pulmonary Rehab Walk Test from 07/11/2018 in [facility]  Referring Provider  Dr. Lamonte Sakai       Number of Visits: 10  Reason for Discharge:  Early Exit:  due to department closure with COVID-19 precautions  Smoking History:  Social History   Tobacco Use  Smoking Status Former Smoker  . Packs/day: 0.50  . Years: 40.00  . Pack years: 20.00  . Types: Cigarettes  . Quit date: 03/17/2013  . Years since quitting: 5.9  Smokeless Tobacco Never Used    Diagnosis:  Chronic obstructive pulmonary disease, unspecified COPD type ([place])  ADL [facility]SD:   Initial Exercise Prescription:   Discharge Exercise Prescription (Final Exercise Prescription Changes):   Functional Capacity:   Psychological, QOL, Others - Outcomes: PHQ 2/9: Depression screen [facility] 2/9 07/10/2018 06/05/2014 05/23/2014 04/26/2014  Decreased Interest 0 0 0 -  Down, Depressed, Hopeless 0 0 0 0  PHQ - 2 Score 0 0 0 0  Altered sleeping 0 - - -  Tired, decreased energy 1 - - -  Change in appetite 0 - - -  Feeling bad or failure about yourself  0 - - -  Trouble concentrating 0 - - -  Moving slowly or fidgety/restless 0 - - -  Suicidal thoughts 0 - - -  PHQ-9 Score 1 - - -  Difficult doing work/chores Not difficult at all - - -  Some recent data might be hidden    Quality of Life:   Personal Goals: Goals established at orientation with interventions provided to work toward goal.    Personal Goals Discharge:   Exercise Goals and Review:   Exercise Goals Re-Evaluation:   Nutrition & Weight - Outcomes:    Nutrition:   Nutrition Discharge:   Education Questionnaire Score:   Goals reviewed with patient; copy given to patient.

## 2019-03-15 NOTE — Addendum Note (Signed)
Encounter addended by: Lance Morin, RN on: 03/15/2019 4:38 PM  Actions taken: Clinical Note Signed, Episode resolved

## 2019-04-06 ENCOUNTER — Ambulatory Visit (INDEPENDENT_AMBULATORY_CARE_PROVIDER_SITE_OTHER): Payer: Medicare Other | Admitting: Podiatry

## 2019-04-06 ENCOUNTER — Other Ambulatory Visit: Payer: Self-pay

## 2019-04-06 ENCOUNTER — Encounter: Payer: Self-pay | Admitting: Podiatry

## 2019-04-06 VITALS — BP 153/78 | HR 72 | Resp 16

## 2019-04-06 DIAGNOSIS — M79675 Pain in left toe(s): Secondary | ICD-10-CM | POA: Diagnosis not present

## 2019-04-06 DIAGNOSIS — L84 Corns and callosities: Secondary | ICD-10-CM

## 2019-04-06 DIAGNOSIS — M79674 Pain in right toe(s): Secondary | ICD-10-CM | POA: Diagnosis not present

## 2019-04-06 NOTE — Patient Instructions (Signed)

## 2019-04-07 ENCOUNTER — Encounter: Payer: Self-pay | Admitting: Podiatry

## 2019-04-07 NOTE — Progress Notes (Signed)
Subjective: [name] [name] presents today with cc of painful calluses b/l great toes for the past couple of months.  which interfere with daily activities.  Pain is aggravated when wearing certain shoe gear and getting caught in bedsheets.   She has h/o lung cancer treated with chemotherapy, radiation and surgery. She has been in remission for 5 years.  Past Medical History:  Diagnosis Date  . Anxiety    husband also cancer pt  . Cancer ([place])    lung  . Complication of anesthesia    bronchospasms post-op (pulmonary did consult but pt was discharged later that day)  With last surg. she was given albuterol inhaler & had steroid with surg.    . Congenital renal atrophy    left kidney, one spontaneous stone passed   . COPD (chronic obstructive pulmonary disease) ([place])   . Coronary artery disease   . Degenerative arthritis    spine, hands & knees   . GERD (gastroesophageal reflux disease)   . History of migraine   . Hypertension    h/o PVC- asymptomatic   . Memory disorder 03/29/2017  . 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 PONV (postoperative nausea and vomiting)   . PVC (premature ventricular contraction)   . Shortness of breath dyspnea    with exertion  . Thyroid nodule    benign by biopsy  . Varicose veins    left leg  . Visual disturbance    Episodic  . Vitamin D deficiency   . Wears glasses      Patient Active Problem List   Diagnosis Date Noted  . Memory disorder 03/29/2017  . 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 Primary cancer of right lower lobe of lung ([place]) 04/17/2014  . Hx of tobacco use, presenting hazards to health 04/09/2014  . COPD GOLD II B  04/09/2014  . Cough 04/09/2014  . Migraine equivalent 11/09/2012  . Transient visual loss 01/25/2012     Past Surgical History:  Procedure Laterality Date  . CARPAL TUNNEL RELEASE Left   . CESAREAN SECTION    . COLONOSCOPY W/ BIOPSIES AND  POLYPECTOMY    . DILATION AND CURETTAGE OF UTERUS    . TONSILLECTOMY    . VARICOSE VEIN SURGERY     left  . VIDEO ASSISTED THORACOSCOPY (VATS)/ LOBECTOMY Right 07/23/2014   Procedure: VIDEO ASSISTED THORACOSCOPY (VATS)/ LOBECTOMY;  Surgeon: Ivin Poot, MD;  Location: [facility];  Service: Thoracic;  Laterality: Right;  [name] Kitchen VIDEO BRONCHOSCOPY N/A 07/23/2014   Procedure: VIDEO BRONCHOSCOPY;  Surgeon: Ivin Poot, MD;  Location: [facility];  Service: Thoracic;  Laterality: N/A;  . VIDEO BRONCHOSCOPY WITH ENDOBRONCHIAL ULTRASOUND Right 04/17/2014   Procedure: VIDEO BRONCHOSCOPY WITH ENDOBRONCHIAL ULTRASOUND;  Surgeon: Collene Gobble, MD;  Location: [facility];  Service: Thoracic;  Laterality: Right;  [name] Kitchen VIDEO BRONCHOSCOPY WITH ENDOBRONCHIAL ULTRASOUND N/A 07/08/2014   Procedure: VIDEO BRONCHOSCOPY WITH ENDOBRONCHIAL ULTRASOUND;  Surgeon: Ivin Poot, MD;  Location: [facility] OR;  Service: Thoracic;  Laterality: N/A;     Current Outpatient Medications on File Prior to Visit  Medication Sig Dispense Refill  . albuterol (PROAIR HFA) 108 (90 Base) MCG/ACT inhaler Inhale 1 puff into the lungs 3 (three) times daily as needed for wheezing or shortness of breath. 1 Inhaler 2  . aspirin EC 81 MG tablet Take 1 tablet (81 mg total) by mouth daily. 90 tablet 3  . nitroGLYCERIN (  NITROSTAT) 0.4 MG SL tablet Place 1 tablet (0.4 mg total) under the tongue every 5 (five) minutes as needed for chest pain. 25 tablet 3  . rosuvastatin (CRESTOR) 20 MG tablet Take 1 tablet (20 mg total) by mouth daily. 90 tablet 3  . verapamil (CALAN-SR) 240 MG CR tablet Take 1 tablet (240 mg total) by mouth at bedtime. 90 tablet 0  . Vitamin D, Ergocalciferol, (DRISDOL) 50000 units CAPS capsule Take 50,000 Units by mouth every 7 (seven) days.     Current Facility-Administered Medications on File Prior to Visit  Medication Dose Route Frequency Provider Last Rate Last Dose  . Ampicillin-Sulbactam (UNASYN) 3 g in sodium chloride 0.9 % 100 mL IVPB  3  g Intravenous Once Avelina Laine, PA-C         Allergies  Allergen Reactions  . Advil [Ibuprofen] Other (See Comments)    Causes hematuria   . Hydrocodone-Guaifenesin Itching  . Sulfa Antibiotics Hives and Swelling  . Temazepam Other (See Comments)    dizziness  . Plasticized Base [Plastibase] Rash    tape  . Zofran [Ondansetron Hcl] Palpitations     Social History   Occupational History  . Occupation: Nurse    Employer: [employer]  Tobacco Use  . Smoking status: Former Smoker    Packs/day: 0.50    Years: 40.00    Pack years: 20.00    Types: Cigarettes    Quit date: 03/17/2013    Years since quitting: 6.0  . Smokeless tobacco: Never Used  Substance and Sexual Activity  . Alcohol use: Yes    Alcohol/week: 0.0 standard drinks    Comment: " rare"  . Drug use: No  . Sexual activity: Not on file     Family History  Problem Relation Age of Onset  . Cancer Mother        cancer of the small intestines  . Hypertension Father   . Atrial fibrillation Father   . Diverticulitis Father   . Hypertension Sister   . Diverticulitis Brother   . Hypertension Sister   . Headache Sister      Immunization History  Administered Date(s) Administered  . Influenza Split 03/20/2018  . Influenza,inj,Quad PF,6+ Mos 03/10/2015, 02/15/2017  . Influenza-Unspecified 03/07/2014  . Pneumococcal-Unspecified 09/23/2013  . Tdap 09/28/2016, 04/06/2017  . Zoster Recombinat (Shingrix) 11/11/2017     Review of systems: Positive Findings in bold print.  Constitutional:  chills, fatigue, fever, sweats, weight change Communication: Optometrist, sign Ecologist, hand writing, iPad/Android device Head: headaches, head injury Eyes: changes in vision, eye pain, glaucoma, cataracts, macular degeneration, diplopia, glare,  light sensitivity, eyeglasses or contacts, blindness Ears nose mouth throat: hearing impaired, hearing aids,  ringing in ears, deaf, sign language,  vertigo,    nosebleeds,  rhinitis,  cold sores, snoring, swollen glands Cardiovascular: HTN, edema, arrhythmia, pacemaker in place, defibrillator in place, chest pain/tightness, chronic anticoagulation, blood clot, heart failure, MI Peripheral Vascular: leg cramps, varicose veins, blood clots, lymphedema, varicosities Respiratory:  difficulty breathing, denies congestion, SOB, wheezing, cough, emphysema Gastrointestinal: change in appetite or weight, abdominal pain, constipation, diarrhea, nausea, vomiting, vomiting blood, change in bowel habits, abdominal pain, jaundice, rectal bleeding, hemorrhoids, GERD Genitourinary:  nocturia,  pain on urination, polyuria,  blood in urine, Foley catheter, urinary urgency, ESRD on hemodialysis Musculoskeletal: amputation, cramping, stiff joints, painful joints, decreased joint motion, fractures, OA, gout, hemiplegia, paraplegia, uses cane, wheelchair bound, uses walker, uses rollator Skin: +changes in toenails, color change, dryness, itching, mole changes,  rash, wound(s) Neurological: migraine headaches, numbness in feet, paresthesias in feet, burning in feet, fainting,  seizures, change in speech. denies headaches, memory problems/poor historian, cerebral palsy, weakness, paralysis, CVA, TIA Endocrine: diabetes, hypothyroidism, hyperthyroidism,  goiter, dry mouth, flushing, heat intolerance,  cold intolerance,  excessive thirst, denies polyuria,  nocturia Hematological:  easy bleeding, excessive bleeding, easy bruising, enlarged lymph nodes, on long term blood thinner, history of past transusions Allergy/immunological:  hives, eczema, frequent infections, multiple drug allergies, seasonal allergies, transplant recipient, multiple food allergies Psychiatric:  anxiety, depression, mood disorder, suicidal ideations, hallucinations, insomnia  Objective: Vitals:   04/06/19 1105  BP: (!) 153/78  Pulse: 72  Resp: 16    Vascular Examination: Capillary refill time immediate  b/l.   Dorsalis pedis pulses and Posterior tibial pulses palpable b/l.   Digital hair present b/l.  Skin temperature gradient WNL b/l.  Dermatological Examination: Skin with normal turgor, texture and tone b/l.  Toenails 1-5 b/l well manicured.  Hyperkeratotic lesion b/l hallux with tenderness to palpation. No edema, no erythema, no drainage, no flocculence.  Musculoskeletal: Muscle strength 5/5 to all LE muscle groups b/l.  Neurological: Sensation intact 5/5 b/l with 10 gram monofilament.  Vibratory sensation intact.  Assessment: 1. Painful calluses b/l hallux 2. Pain in toes b/l  Plan: 1. Discussed calluses and treatment options.  Literature dispensed on today. Calluses pared b/l great toes utilizing sterile scalpel blade without incident. 2. Patient to continue soft, supportive shoe gear daily. 3. Patient to report any pedal injuries to medical professional immediately. 4. Follow up 3 months.  5. Patient/POA to call should there be a concern in the interim.

## 2019-04-13 DIAGNOSIS — Z23 Encounter for immunization: Secondary | ICD-10-CM | POA: Diagnosis not present

## 2019-04-24 ENCOUNTER — Other Ambulatory Visit: Payer: Medicare Other

## 2019-04-30 ENCOUNTER — Other Ambulatory Visit: Payer: Self-pay

## 2019-04-30 ENCOUNTER — Other Ambulatory Visit: Payer: Medicare Other | Admitting: *Deleted

## 2019-04-30 DIAGNOSIS — J449 Chronic obstructive pulmonary disease, unspecified: Secondary | ICD-10-CM | POA: Diagnosis not present

## 2019-04-30 DIAGNOSIS — F419 Anxiety disorder, unspecified: Secondary | ICD-10-CM | POA: Diagnosis not present

## 2019-04-30 DIAGNOSIS — Z23 Encounter for immunization: Secondary | ICD-10-CM | POA: Diagnosis not present

## 2019-04-30 DIAGNOSIS — F330 Major depressive disorder, recurrent, mild: Secondary | ICD-10-CM | POA: Diagnosis not present

## 2019-04-30 DIAGNOSIS — I25118 Atherosclerotic heart disease of native coronary artery with other forms of angina pectoris: Secondary | ICD-10-CM

## 2019-04-30 DIAGNOSIS — I10 Essential (primary) hypertension: Secondary | ICD-10-CM | POA: Diagnosis not present

## 2019-04-30 DIAGNOSIS — D692 Other nonthrombocytopenic purpura: Secondary | ICD-10-CM | POA: Diagnosis not present

## 2019-04-30 DIAGNOSIS — M8589 Other specified disorders of bone density and structure, multiple sites: Secondary | ICD-10-CM | POA: Diagnosis not present

## 2019-04-30 DIAGNOSIS — G4700 Insomnia, unspecified: Secondary | ICD-10-CM | POA: Diagnosis not present

## 2019-04-30 DIAGNOSIS — E785 Hyperlipidemia, unspecified: Secondary | ICD-10-CM | POA: Diagnosis not present

## 2019-04-30 DIAGNOSIS — R0609 Other forms of dyspnea: Secondary | ICD-10-CM | POA: Diagnosis not present

## 2019-04-30 DIAGNOSIS — E7849 Other hyperlipidemia: Secondary | ICD-10-CM | POA: Diagnosis not present

## 2019-05-01 ENCOUNTER — Telehealth: Payer: Self-pay | Admitting: *Deleted

## 2019-05-01 DIAGNOSIS — E785 Hyperlipidemia, unspecified: Secondary | ICD-10-CM

## 2019-05-01 LAB — HEPATIC FUNCTION PANEL
ALT: 20 IU/L (ref 0–32)
AST: 17 IU/L (ref 0–40)
Albumin: 4.4 g/dL (ref 3.8–4.8)
Alkaline Phosphatase: 124 IU/L — ABNORMAL HIGH (ref 39–117)
Bilirubin Total: 0.6 mg/dL (ref 0.0–1.2)
Bilirubin, Direct: 0.18 mg/dL (ref 0.00–0.40)
Total Protein: 6.7 g/dL (ref 6.0–8.5)

## 2019-05-01 LAB — LIPID PANEL
Chol/HDL Ratio: 1.8 ratio (ref 0.0–4.4)
Cholesterol, Total: 148 mg/dL (ref 100–199)
HDL: 82 mg/dL (ref >39)
LDL Chol Calc (NIH): 53 mg/dL (ref 0–99)
Triglycerides: 64 mg/dL (ref 0–149)
VLDL Cholesterol Cal: 13 mg/dL (ref 5–40)

## 2019-05-01 NOTE — Telephone Encounter (Signed)
Notes recorded by Michae Kava, CMA on 05/01/2019 at 3:08 PM EST  Pt has been notified of lab results by phone with verbal understanding. Pt has scheduled repeat labs for 04/04/20. Pt thanked me for the call. Patient notified of result. Please refer to phone note from today for complete details.   Julaine Hua, [facility] 05/01/2019 3:08 PM

## 2019-05-15 ENCOUNTER — Ambulatory Visit (INDEPENDENT_AMBULATORY_CARE_PROVIDER_SITE_OTHER): Payer: Medicare Other | Admitting: Emergency Medicine

## 2019-05-15 ENCOUNTER — Encounter: Payer: Self-pay | Admitting: Emergency Medicine

## 2019-05-15 ENCOUNTER — Other Ambulatory Visit: Payer: Self-pay

## 2019-05-15 DIAGNOSIS — I2510 Atherosclerotic heart disease of native coronary artery without angina pectoris: Secondary | ICD-10-CM

## 2019-05-15 DIAGNOSIS — R05 Cough: Secondary | ICD-10-CM | POA: Diagnosis not present

## 2019-05-15 DIAGNOSIS — J449 Chronic obstructive pulmonary disease, unspecified: Secondary | ICD-10-CM

## 2019-05-15 DIAGNOSIS — R059 Cough, unspecified: Secondary | ICD-10-CM

## 2019-05-15 MED ORDER — ALBUTEROL SULFATE HFA 108 (90 BASE) MCG/ACT IN AERS: 1.0000 | INHALATION_SPRAY | Freq: Three times a day (TID) | RESPIRATORY_TRACT | 5 refills | Status: DC | PRN

## 2019-05-15 MED ORDER — SPIRIVA RESPIMAT 2.5 MCG/ACT IN AERS: 2.0000 | INHALATION_SPRAY | Freq: Every day | RESPIRATORY_TRACT | 0 refills | Status: DC

## 2019-05-15 NOTE — Progress Notes (Signed)
Subjective:    Patient ID: [name] [name], female    DOB: 20-Oct-1955, 63 y.o.   MRN: [id number]  COPD She complains of cough and shortness of breath. There is no wheezing. Pertinent negatives include no ear pain, fever, headaches, postnasal drip, rhinorrhea, sneezing, sore throat or trouble swallowing. Her past medical history is significant for COPD.    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  She also has coronary artery disease, underwent CT angiogram that identified proximal right coronary obstruction, patent LAD and circumflex.  Currently managed on albuterol which she uses approximately.  Most recent PFT were in 2015, showed moderately severe obstruction. She has been having more exertional dyspnea with house work since February, not associated with CP, wheeze or cough.  Last flare, abx given in 05/2018.  Most recent dedicated chest imaging 01/24/2019, without evidence of any recurrent disease.  Review of Systems  Constitutional: Negative for fever and unexpected weight change.  HENT: Negative for congestion, dental problem, ear pain, nosebleeds, postnasal drip, rhinorrhea, sinus pressure, sneezing, sore throat and trouble swallowing.   Eyes: Negative for redness and itching.  Respiratory: Positive for cough and shortness of breath. Negative for chest tightness and wheezing.   Cardiovascular: Negative for palpitations and leg swelling.  Gastrointestinal: Negative for nausea and vomiting.  Genitourinary: Negative for dysuria.  Musculoskeletal: Negative for joint swelling.  Skin: Negative for rash.  Neurological: Negative for headaches.  Hematological: Does not bruise/bleed easily.  Psychiatric/Behavioral: Negative for dysphoric mood. The patient is not nervous/anxious.        Objective:   Physical Exam Vitals:   05/15/19 1120  BP: 126/72  Pulse: 78  Temp:  98 F (36.7 C)  TempSrc: Temporal  SpO2: 98%  Weight: 166 lb (75.3 kg)  Height: 5\' 5"  (1.651 m)   Gen: Pleasant, well-nourished, in no distress,  normal affect  ENT: No lesions,  mouth clear,  oropharynx clear, no postnasal drip, no stridor  Lungs: No use of accessory muscles, no crackles, no active wheezing  Cardiovascular: RRR, heart sounds normal, no murmur or gallops, no peripheral edema  Musculoskeletal: No deformities, no cyanosis or clubbing  Neuro: alert, non focal  Skin: Warm, no lesions or rashes       Assessment & Plan:  COPD GOLD II B  Moderately severe obstruction based on her PFT from 2015, she has never needed/wanted to be on schedule bronchodilator therapy. Now with progressive dyspnea and some question as to whether this is due to her COPD versus her known coronary disease which is being treated medically. I think we should try to treat her obstructive lung disease, see if she gets benefit. If so then likely no further cardiology intervention is needed. If she does not notice any improvement on scheduled bronchodilator therapy then we may need to revisit with cardiology.   Please start Spiriva Respimat 2 puffs once daily. Keep your albuterol available use 2 puffs if needed for shortness of breath, chest tightness, wheezing. Depending on how your breathing responds to the Spiriva we will make a decision about whether to continue. We will refer you back to pulmonary rehab at some point in the future. We will coordinate your care with Dr. Tamala Julian in cardiology, especially if starting the inhaled medication does not change your shortness of breath. Follow with Dr Lamonte Sakai by phone in 4 to 6 weeks to review any improvement on the new medication.  Cough She is experiencing a globus sensation, not so much cough. No GERD, possibly some mild allergic symptoms. I asked her to start nasal saline rinses to see if this is beneficial.  Baltazar Apo, MD, PhD 05/15/2019, 12:51 PM  [facility] [phone] or if no answer [phone]

## 2019-05-15 NOTE — Patient Instructions (Addendum)
Please start Spiriva Respimat 2 puffs once daily. Keep your albuterol available use 2 puffs if needed for shortness of breath, chest tightness, wheezing. Depending on how your breathing responds to the Spiriva we will make a decision about whether to continue. We will refer you back to pulmonary rehab at some point in the future. We will coordinate your care with Dr. Tamala Julian in cardiology, especially if starting the inhaled medication does not change your shortness of breath. Follow with Dr Lamonte Sakai by phone in 4 to 6 weeks to review any improvement on the new medication.

## 2019-05-15 NOTE — Progress Notes (Signed)
Patient seen in the office today and instructed on use of Spiriva Respimat 2.5.  Patient expressed understanding and demonstrated technique.

## 2019-05-15 NOTE — Assessment & Plan Note (Signed)
Moderately severe obstruction based on her PFT from 2015, she has never needed/wanted to be on schedule bronchodilator therapy. Now with progressive dyspnea and some question as to whether this is due to her COPD versus her known coronary disease which is being treated medically. I think we should try to treat her obstructive lung disease, see if she gets benefit. If so then likely no further cardiology intervention is needed. If she does not notice any improvement on scheduled bronchodilator therapy then we may need to revisit with cardiology.   Please start Spiriva Respimat 2 puffs once daily. Keep your albuterol available use 2 puffs if needed for shortness of breath, chest tightness, wheezing. Depending on how your breathing responds to the Spiriva we will make a decision about whether to continue. We will refer you back to pulmonary rehab at some point in the future. We will coordinate your care with Dr. Tamala Julian in cardiology, especially if starting the inhaled medication does not change your shortness of breath. Follow with Dr Lamonte Sakai by phone in 4 to 6 weeks to review any improvement on the new medication.

## 2019-05-15 NOTE — Assessment & Plan Note (Signed)
She is experiencing a globus sensation, not so much cough. No GERD, possibly some mild allergic symptoms. I asked her to start nasal saline rinses to see if this is beneficial.

## 2019-05-22 DIAGNOSIS — H52203 Unspecified astigmatism, bilateral: Secondary | ICD-10-CM | POA: Diagnosis not present

## 2019-05-22 DIAGNOSIS — H18603 Keratoconus, unspecified, bilateral: Secondary | ICD-10-CM | POA: Diagnosis not present

## 2019-05-22 DIAGNOSIS — Z961 Presence of intraocular lens: Secondary | ICD-10-CM | POA: Diagnosis not present

## 2019-07-13 ENCOUNTER — Ambulatory Visit: Payer: Medicare Other | Admitting: Podiatry

## 2019-07-20 ENCOUNTER — Telehealth: Payer: Self-pay | Admitting: Emergency Medicine

## 2019-07-20 MED ORDER — ALBUTEROL SULFATE HFA 108 (90 BASE) MCG/ACT IN AERS: 1.0000 | INHALATION_SPRAY | Freq: Three times a day (TID) | RESPIRATORY_TRACT | 5 refills | Status: DC | PRN

## 2019-07-20 NOTE — Telephone Encounter (Signed)
Pt requesting refill on albuterol.  This has been sent to preferred pharmacy.  Nothing further needed at this time- will close encounter.

## 2019-07-24 DIAGNOSIS — D485 Neoplasm of uncertain behavior of skin: Secondary | ICD-10-CM | POA: Diagnosis not present

## 2019-07-24 DIAGNOSIS — H6692 Otitis media, unspecified, left ear: Secondary | ICD-10-CM | POA: Diagnosis not present

## 2019-07-24 DIAGNOSIS — B078 Other viral warts: Secondary | ICD-10-CM | POA: Diagnosis not present

## 2019-07-24 DIAGNOSIS — H9202 Otalgia, left ear: Secondary | ICD-10-CM | POA: Diagnosis not present

## 2019-08-03 ENCOUNTER — Other Ambulatory Visit: Payer: Self-pay

## 2019-08-03 ENCOUNTER — Ambulatory Visit
Admission: RE | Admit: 2019-08-03 | Discharge: 2019-08-03 | Disposition: A | Discharge status: Home / Self Care | Payer: Medicare Other | Source: Ambulatory Visit | Attending: Internal Medicine | Admitting: Internal Medicine

## 2019-08-03 ENCOUNTER — Inpatient Hospital Stay: Payer: Medicare Other | Attending: Internal Medicine

## 2019-08-03 DIAGNOSIS — C3490 Malignant neoplasm of unspecified part of unspecified bronchus or lung: Secondary | ICD-10-CM

## 2019-08-03 DIAGNOSIS — Z85118 Personal history of other malignant neoplasm of bronchus and lung: Secondary | ICD-10-CM | POA: Diagnosis not present

## 2019-08-03 LAB — CMP (CANCER CENTER ONLY)
ALT: 17 U/L (ref 0–44)
AST: 19 U/L (ref 15–41)
Albumin: 4 g/dL (ref 3.5–5.0)
Alkaline Phosphatase: 115 U/L (ref 38–126)
Anion gap: 8 (ref 5–15)
BUN: 15 mg/dL (ref 8–23)
CO2: 25 mmol/L (ref 22–32)
Calcium: 9.2 mg/dL (ref 8.9–10.3)
Chloride: 107 mmol/L (ref 98–111)
Creatinine: 0.78 mg/dL (ref 0.44–1.00)
GFR, Est AFR Am: >60 mL/min (ref >60)
GFR, Estimated: >60 mL/min (ref >60)
Glucose, Bld: 98 mg/dL (ref 70–99)
Potassium: 4.1 mmol/L (ref 3.5–5.1)
Sodium: 140 mmol/L (ref 135–145)
Total Bilirubin: 0.5 mg/dL (ref 0.3–1.2)
Total Protein: 7.2 g/dL (ref 6.5–8.1)

## 2019-08-03 LAB — CBC WITH DIFFERENTIAL (CANCER CENTER ONLY)
Abs Immature Granulocytes: 0.02 10*3/uL (ref 0.00–0.07)
Basophils Absolute: 0.1 10*3/uL (ref 0.0–0.1)
Basophils Relative: 1 %
Eosinophils Absolute: 0.2 10*3/uL (ref 0.0–0.5)
Eosinophils Relative: 3 %
HCT: 42.8 % (ref 36.0–46.0)
Hemoglobin: 14 g/dL (ref 12.0–15.0)
Immature Granulocytes: 0 %
Lymphocytes Relative: 22 %
Lymphs Abs: 1.6 10*3/uL (ref 0.7–4.0)
MCH: 30.1 pg (ref 26.0–34.0)
MCHC: 32.7 g/dL (ref 30.0–36.0)
MCV: 92 fL (ref 80.0–100.0)
Monocytes Absolute: 0.6 10*3/uL (ref 0.1–1.0)
Monocytes Relative: 8 %
Neutro Abs: 4.7 10*3/uL (ref 1.7–7.7)
Neutrophils Relative %: 66 %
Platelet Count: 258 10*3/uL (ref 150–400)
RBC: 4.65 MIL/uL (ref 3.87–5.11)
RDW: 12.9 % (ref 11.5–15.5)
WBC Count: 7.2 10*3/uL (ref 4.0–10.5)
nRBC: 0 % (ref 0.0–0.2)

## 2019-08-03 MED ORDER — IOPAMIDOL (ISOVUE-300) INJECTION 61%
75.0000 mL | Freq: Once | INTRAVENOUS | Status: AC | PRN
  Administered 2019-08-03: 75 mL via INTRAVENOUS

## 2019-08-07 ENCOUNTER — Telehealth: Payer: Self-pay | Admitting: Internal Medicine

## 2019-08-07 ENCOUNTER — Inpatient Hospital Stay: Payer: Medicare Other | Attending: Internal Medicine | Admitting: Internal Medicine

## 2019-08-07 ENCOUNTER — Other Ambulatory Visit: Payer: Self-pay

## 2019-08-07 ENCOUNTER — Encounter: Payer: Self-pay | Admitting: Internal Medicine

## 2019-08-07 DIAGNOSIS — C3490 Malignant neoplasm of unspecified part of unspecified bronchus or lung: Secondary | ICD-10-CM | POA: Diagnosis not present

## 2019-08-07 DIAGNOSIS — Z85118 Personal history of other malignant neoplasm of bronchus and lung: Secondary | ICD-10-CM | POA: Insufficient documentation

## 2019-08-07 NOTE — Telephone Encounter (Signed)
Scheduled per 3/2 los. Gave avs and calendar

## 2019-08-07 NOTE — Progress Notes (Signed)
[place] [phone]   [phone]  OFFICE PROGRESS NOTE  [name] [name], [place] [address]  DIAGNOSIS: Primary cancer of right lower lobe of lung  Staging form: Lung, AJCC 7th Edition  Clinical: Stage IIIA (T2a, N2, M0) - Unsigned  Staging comments: Adenocarcinoma  Foundation 1 molecular studies: positive for [id number], KRASG13C, [id number]*, [id number]*11, GATA6 amplification and [id number] Negative for BRAF, ALK, MET, RET and ERBB2  PRIOR THERAPY:  1) Concurrent chemoradiation with chemotherapy in the form of weekly carboplatin for an AUC of 2 and paclitaxel 45 mg/m given concurrent with radiation 2) Right VATS (video-assisted thoracoscopic surgery) with right lower lobectomy and mediastinal lymph node dissection under the care of [name] 07/23/2014. The final pathology showed residual 1.8 cm moderate to poorly differentiated invasive adenocarcinoma with no pleural or lymphovascular invasion. The dissected lymph nodes were negative for malignancy and the final pathologic stage was (pT1a, pN0, pMx).  CURRENT THERAPY: Observation.   INTERVAL HISTORY: [name] [age]OBST89y.o. female returns to the clinic today for follow-up visit.  The patient is feeling fine today with no concerning complaints.  She was found recently to have coronary artery disease with 75% stenosis of the RCA.  She is followed by Dr. STamala [name]  The patient denied having any current chest pain but has shortness of breath with exertion and she is followed by [name] COPD.  She denied having any cough or hemoptysis.  She denied having any fever or chills.  She has no nausea, vomiting, diarrhea or constipation.  She has no headache or visual changes.  She had repeat CT scan of the chest performed recently and she is here for evaluation and discussion of her risk her results.  MEDICAL HISTORY: Past Medical History:  Diagnosis Date  .  Anxiety    husband also cancer pt  . [place]  . Complication of anesthesia    bronchospasms post-op (pulmonary did consult but pt was discharged later that day)  With last surg. she was given albuterol inhaler & had steroid with surg.    . Congenital renal atrophy    left kidney, one spontaneous stone passed   . COPD (chronic obstructive pulmonary disease) ([place]   . Coronary artery disease   . Degenerative arthritis    spine, hands & knees   . GERD (gastroesophageal reflux disease)   . History of migraine   . Hypertension    h/o PVC- asymptomatic   . Memory disorder 03/29/2017  . 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 PONV (postoperative nausea and vomiting)   . PVC (premature ventricular contraction)   . Shortness of breath dyspnea    with exertion  . Thyroid nodule    benign by biopsy  . Varicose veins    left leg  . Visual disturbance    Episodic  . Vitamin D deficiency   . Wears glasses     ALLERGIES:  is allergic to advil [ibuprofen]; hydrocodone-guaifenesin; sulfa antibiotics; temazepam; plasticized base [plastibase]; and zofran [ondansetron hcl].  MEDICATIONS:  Current Outpatient Medications  Medication Sig Dispense Refill  . albuterol (PROAIR HFA) 108 (90 Base) MCG/ACT inhaler Inhale 1 puff into the lungs 3 (three) times daily as needed for wheezing or shortness of breath. 6.7 g 5  . aspirin EC 81 MG tablet Take 1 tablet (81  mg total) by mouth daily. 90 tablet 3  . nitroGLYCERIN (NITROSTAT) 0.4 MG SL tablet Place 1 tablet (0.4 mg total) under the tongue every 5 (five) minutes as needed for chest pain. 25 tablet 3  . rosuvastatin (CRESTOR) 20 MG tablet Take 1 tablet (20 mg total) by mouth daily. 90 tablet 3  . Tiotropium Bromide Monohydrate (SPIRIVA RESPIMAT) 2.5 MCG/ACT AERS Inhale 2 puffs into the lungs daily. 8 g 0  . verapamil (CALAN-SR) 240 MG CR tablet Take 1 tablet (240 mg  total) by mouth at bedtime. 90 tablet 0  . Vitamin D, Ergocalciferol, (DRISDOL) 50000 units CAPS capsule Take 50,000 Units by mouth every 7 (seven) days.     Current Facility-Administered Medications  Medication Dose Route Frequency Provider Last Rate Last Admin  . Ampicillin-Sulbactam (UNASYN) 3 g in sodium chloride 0.9 % 100 mL IVPB  3 g Intravenous Once Avelina Laine, PA-C        SURGICAL HISTORY:  Past Surgical History:  Procedure Laterality Date  . CARPAL TUNNEL RELEASE Left   . CESAREAN SECTION    . COLONOSCOPY W/ BIOPSIES AND POLYPECTOMY    . DILATION AND CURETTAGE OF UTERUS    . TONSILLECTOMY    . VARICOSE VEIN SURGERY     left  . VIDEO ASSISTED THORACOSCOPY (VATS)/ LOBECTOMY Right 07/23/2014   Procedure: VIDEO ASSISTED THORACOSCOPY (VATS)/ LOBECTOMY;  Surgeon: Ivin Poot, MD;  Location: [facility];  Service: Thoracic;  Laterality: Right;  [name] Kitchen VIDEO BRONCHOSCOPY N/A 07/23/2014   Procedure: VIDEO BRONCHOSCOPY;  Surgeon: Ivin Poot, MD;  Location: [facility];  Service: Thoracic;  Laterality: N/A;  . VIDEO BRONCHOSCOPY WITH ENDOBRONCHIAL ULTRASOUND Right 04/17/2014   Procedure: VIDEO BRONCHOSCOPY WITH ENDOBRONCHIAL ULTRASOUND;  Surgeon: Collene Gobble, MD;  Location: [facility];  Service: Thoracic;  Laterality: Right;  [name] Kitchen VIDEO BRONCHOSCOPY WITH ENDOBRONCHIAL ULTRASOUND N/A 07/08/2014   Procedure: VIDEO BRONCHOSCOPY WITH ENDOBRONCHIAL ULTRASOUND;  Surgeon: Ivin Poot, MD;  Location: [facility];  Service: Thoracic;  Laterality: N/A;    REVIEW OF SYSTEMS:  A comprehensive review of systems was negative except for: Respiratory: positive for dyspnea on exertion   PHYSICAL EXAMINATION: General appearance: alert, cooperative and no distress Head: Normocephalic, without obvious abnormality, atraumatic Neck: no adenopathy, no JVD, supple, symmetrical, trachea midline and thyroid not enlarged, symmetric, no tenderness/mass/nodules Lymph nodes: Cervical, supraclavicular, and axillary nodes  normal. Resp: clear to auscultation bilaterally Back: symmetric, no curvature. ROM normal. No CVA tenderness. Cardio: regular rate and rhythm, S1, S2 normal, no murmur, click, rub or gallop GI: soft, non-tender; bowel sounds normal; no masses,  no organomegaly Extremities: extremities normal, atraumatic, no cyanosis or edema  ECOG PERFORMANCE STATUS: 0 - Asymptomatic  Blood pressure 134/73, pulse 80, temperature 97.8 F (36.6 C), temperature source Temporal, resp. rate 20, height '5\' 5"'$  (1.651 m), weight 172 lb 6.4 oz (78.2 kg), SpO2 99 %.  LABORATORY DATA: Lab Results  Component Value Date   WBC 7.2 08/03/2019   HGB 14.0 08/03/2019   HCT 42.8 08/03/2019   MCV 92.0 08/03/2019   PLT 258 08/03/2019      Chemistry      Component Value Date/Time   NA 140 08/03/2019 1048   NA 142 01/19/2017 0958   K 4.1 08/03/2019 1048   K 4.3 01/19/2017 0958   CL 107 08/03/2019 1048   CO2 25 08/03/2019 1048   CO2 25 01/19/2017 0958   BUN 15 08/03/2019 1048   BUN 15.2 01/19/2017 0958   CREATININE  0.78 08/03/2019 1048   CREATININE 0.8 01/19/2017 0958      Component Value Date/Time   CALCIUM 9.2 08/03/2019 1048   CALCIUM 9.5 01/19/2017 0958   ALKPHOS 115 08/03/2019 1048   ALKPHOS 121 01/19/2017 0958   AST 19 08/03/2019 1048   AST 19 01/19/2017 0958   ALT 17 08/03/2019 1048   ALT 20 01/19/2017 0958   BILITOT 0.5 08/03/2019 1048   BILITOT 0.64 01/19/2017 0958       RADIOGRAPHIC STUDIES: CT Chest W Contrast  Result Date: 08/03/2019 CLINICAL DATA:  Non-small cell lung cancer staging EXAM: CT CHEST WITH CONTRAST TECHNIQUE: Multidetector CT imaging of the chest was performed during intravenous contrast administration. CONTRAST:  61m ISOVUE-300 IOPAMIDOL (ISOVUE-300) INJECTION 61% COMPARISON:  01/24/2019 FINDINGS: Cardiovascular: Mild dilation of the ascending thoracic aorta is unchanged. Heart size is normal without pericardial effusion. Central pulmonary vasculature is unremarkable with  some distortion of right hilar structures following right lower lobectomy. Mediastinum/Nodes: Rounded, unusual appearing extension of the superior pericardial recess along the right paratracheal chain shows minimal change since the study of November of 2015 where it showed no increased FDG uptake. Slight interval enlargement of a rounded lymph node without fatty hilum in the left axilla measuring 12 mm. (Image 36, series 2) Lungs/Pleura: Signs of right lower lobectomy. Pulmonary emphysema with centrilobular predominance is similar to the prior study. Airways are patent. Signs of basilar atelectasis. Bandlike scarring in the anterior left chest is similar to prior studies. No signs of pleural effusion. No signs of pleural nodularity. Upper Abdomen: Stable hepatic cyst arising from the anterior liver. Imaged portions of upper abdominal viscera are unremarkable. Musculoskeletal: No signs of acute bone finding or destructive bone process. Spinal degenerative changes. IMPRESSION: 1. Signs of right lower lobectomy as before. 2. Mild interval enlargement of left axillary lymph node with mild hyperenhancement. Focused ultrasound of this area may be helpful to determine whether biopsy is warranted. 3. Stable hepatic cyst. 4. Stable mild dilation of the ascending thoracic aorta. Electronically Signed   By: GZetta BillsM.D.   On: 08/03/2019 14:58    ASSESSMENT AND PLAN:  This is a very pleasant 64years old white female with a stage IIIA non-small cell lung cancer status post neoadjuvant concurrent chemoradiation with weekly carboplatin and paclitaxel followed by right lower lobectomy and lymph node dissection with the final pathology consistent with a pathological stage pT1a, pN0. The patient has been in observation since that time and she is feeling fine today except for shortness of breath with exertion questionable to be cardiac in origin versus COPD. She had repeat CT scan of the chest performed recently.  I  personally and independently reviewed the scans and discussed the results with the patient today. Her scan showed no concerning findings for disease recurrence or metastasis but there was a slightly enlarged left axillary lymph node. I recommended for the patient to have ultrasound of the left axilla to evaluate the suspicious left axillary lymph node. If no concerning findings, I will see her back for follow-up visit in 6 months with repeat CT scan of the chest for restaging of her disease. She was advised to call immediately if she has any concerning symptoms in the interval. The patient voices understanding of current disease status and treatment options and is in agreement with the current care plan. All questions were answered. The patient knows to call the clinic with any problems, questions or concerns. We can certainly see the patient much sooner if  necessary.  Disclaimer: This note was dictated with voice recognition software. Similar sounding words can inadvertently be transcribed and may not be corrected upon review.

## 2019-08-08 DIAGNOSIS — M13849 Other specified arthritis, unspecified hand: Secondary | ICD-10-CM | POA: Diagnosis not present

## 2019-08-08 DIAGNOSIS — M79641 Pain in right hand: Secondary | ICD-10-CM | POA: Diagnosis not present

## 2019-08-08 DIAGNOSIS — M1811 Unilateral primary osteoarthritis of first carpometacarpal joint, right hand: Secondary | ICD-10-CM | POA: Diagnosis not present

## 2019-08-09 ENCOUNTER — Other Ambulatory Visit: Payer: Self-pay | Admitting: Internal Medicine

## 2019-08-09 DIAGNOSIS — C3490 Malignant neoplasm of unspecified part of unspecified bronchus or lung: Secondary | ICD-10-CM

## 2019-08-15 ENCOUNTER — Ambulatory Visit
Admission: RE | Admit: 2019-08-15 | Discharge: 2019-08-15 | Disposition: A | Discharge status: Home / Self Care | Payer: Medicare Other | Source: Ambulatory Visit | Attending: Internal Medicine | Admitting: Internal Medicine

## 2019-08-15 ENCOUNTER — Other Ambulatory Visit: Payer: Self-pay | Admitting: Internal Medicine

## 2019-08-15 ENCOUNTER — Other Ambulatory Visit: Payer: Self-pay

## 2019-08-15 DIAGNOSIS — R599 Enlarged lymph nodes, unspecified: Secondary | ICD-10-CM

## 2019-08-15 DIAGNOSIS — C3490 Malignant neoplasm of unspecified part of unspecified bronchus or lung: Secondary | ICD-10-CM

## 2019-08-15 DIAGNOSIS — N6489 Other specified disorders of breast: Secondary | ICD-10-CM | POA: Diagnosis not present

## 2019-08-15 DIAGNOSIS — R922 Inconclusive mammogram: Secondary | ICD-10-CM | POA: Diagnosis not present

## 2019-08-24 ENCOUNTER — Telehealth: Payer: Self-pay | Admitting: Emergency Medicine

## 2019-08-24 NOTE — Telephone Encounter (Signed)
Instructions from last OV with RB 05/15/19  Please start Spiriva Respimat 2 puffs once daily. Keep your albuterol available use 2 puffs if needed for shortness of breath, chest tightness, wheezing. Depending on how your breathing responds to the Spiriva we will make a decision about whether to continue. We will refer you back to pulmonary rehab at some point in the future. We will coordinate your care with Dr. Tamala Julian in cardiology, especially if starting the inhaled medication does not change your shortness of breath. Follow with Dr Lamonte Sakai by phone in 4 to 6 weeks to review any improvement on the new medication.     Called and spoke in regards to last OV. Stated to her that Dr. Lamonte Sakai had said to follow up by phone 4-6 weeks from last appt to review any improvement on spiriva.  Asked pt if I could schedule her a televisit to regroup to see how things have been doing and when stated that to pt, pt stated that she wanted to just let Dr. Lamonte Sakai know that she has had little improvement after beginning the spiriva.  Dr. Lamonte Sakai, please advise.

## 2019-08-28 DIAGNOSIS — Z01419 Encounter for gynecological examination (general) (routine) without abnormal findings: Secondary | ICD-10-CM | POA: Diagnosis not present

## 2019-08-28 DIAGNOSIS — Z124 Encounter for screening for malignant neoplasm of cervix: Secondary | ICD-10-CM | POA: Diagnosis not present

## 2019-08-29 NOTE — Telephone Encounter (Signed)
I would offer to send Spiriva Respimat to her pharmacy if we haven't already done so

## 2019-08-29 NOTE — Telephone Encounter (Signed)
Dr Lamonte Sakai, she is already on the spiriva respimat 2.5 mcg

## 2019-08-29 NOTE — Telephone Encounter (Signed)
Thanks

## 2019-08-30 ENCOUNTER — Telehealth: Payer: Self-pay | Admitting: Emergency Medicine

## 2019-08-30 NOTE — Telephone Encounter (Signed)
I called and spoke with the patient and she wants to know if she needs to stay on the Spiriva longer since it only helped her about 10% and she was unsure if it was due to her heart or lungs. She states that she is willing to have a visit with a NP if she needs to. Please advise.

## 2019-09-04 NOTE — Telephone Encounter (Signed)
I'm Ok if she stops it since she is unsure whether she has had significant benefit. Then set her up with either me or APP to discuss

## 2019-09-05 NOTE — Telephone Encounter (Signed)
Spoke with the pt and notified of response per RB and she verbalized understanding. Appt with Aaron Edelman was scheduled for 09/17/19.

## 2019-09-14 ENCOUNTER — Ambulatory Visit: Payer: Medicare Other | Admitting: Interventional Cardiology

## 2019-09-17 ENCOUNTER — Other Ambulatory Visit: Payer: Self-pay

## 2019-09-17 ENCOUNTER — Encounter: Payer: Self-pay | Admitting: Pulmonary Disease

## 2019-09-17 ENCOUNTER — Ambulatory Visit (INDEPENDENT_AMBULATORY_CARE_PROVIDER_SITE_OTHER): Payer: Medicare Other | Admitting: Pulmonary Disease

## 2019-09-17 VITALS — BP 138/74 | HR 73 | Temp 97.9°F | Ht 65.0 in | Wt 172.0 lb

## 2019-09-17 DIAGNOSIS — J449 Chronic obstructive pulmonary disease, unspecified: Secondary | ICD-10-CM | POA: Diagnosis not present

## 2019-09-17 DIAGNOSIS — R0602 Shortness of breath: Secondary | ICD-10-CM

## 2019-09-17 DIAGNOSIS — Z79899 Other long term (current) drug therapy: Secondary | ICD-10-CM

## 2019-09-17 DIAGNOSIS — R0600 Dyspnea, unspecified: Secondary | ICD-10-CM | POA: Insufficient documentation

## 2019-09-17 HISTORY — DX: Shortness of breath: R06.02

## 2019-09-17 MED ORDER — STIOLTO RESPIMAT 2.5-2.5 MCG/ACT IN AERS: 2.0000 | INHALATION_SPRAY | Freq: Every day | RESPIRATORY_TRACT | 0 refills | Status: DC

## 2019-09-17 NOTE — Progress Notes (Signed)
@Patient  ID: [name] [name], female    DOB: 06/10/1955, 64 y.o.   MRN: [id number]  Chief Complaint  Patient presents with  . Follow-up    Patient was on Spiriva and stopped taking it due to trying to decided if her problem were lungs or heart. Patient didn't notice significant improvment from Spiriva. Patient is having shortness of breath with exertion and fatigue.     Referring provider: [name], MD  HPI:  64 year old female former smoker followed in our office for COPD  PMH: Migraine, frequent PVCs Smoker/ Smoking History: Former smoker.  Quit 2014.  20-pack-year smoking history Maintenance: Spiriva Respimat 2.5 Pt of: Dr. Lamonte Sakai  09/17/2019  - Visit   64 year old female former smoker followed in our office for COPD.  Patient was last seen in our office in December/2020 by Dr. Lamonte Sakai.  At that time it was recommended that she start Spiriva Respimat 2.5.  She was referred to pulmonary rehab.  Patient contacted our office in March/2021 letting us know that she was not sure if the Spiriva Respimat was helping.  She was going to trial coming off of it.  This prompted scheduled follow-up today.  Patient reports she was last seen by [facility] cardiology Dr. Tamala Julian in October/2020.  At that time she was found to have 75% blockage.  Dr. Tamala Julian told the patient that when she became more than 80% stenosed she would require stent.  She is planning on having follow-up with Dr. Thompson Caul office in May/2021.  She had follow-up scheduled in April/2021 but this was pushed back a month due to her wanting to see pulmonary first.  After last seen Dr. Lamonte Sakai in summer/2020 she did feel that there was clinical improvement with pulmonary rehab.  Unfortunately she was only able to complete 4 weeks of pulmonary rehab before it was shut down again due to COVID-19.  Patient reports that when they called to have her come back now the pulmonary rehab is open she declined it due to the masking requirement.  She  reports that she cannot exercise with a mask on.  Patient is interested in returning back to pulmonary rehab when the masking requirement is removed.  Patient suspects that after having an abnormal COPD exacerbation December/2019 that she may have had Covid.  She has never had a formal Covid test or an antibody test to further evaluate this.   Patient walked a brisk pace in our office completed 3 laps without any oxygen desaturations.   Questionaires / Pulmonary Flowsheets:   MMRC: mMRC Dyspnea Scale mMRC Score  09/17/2019 0    Tests:   01/24/2019-CBC with differential-eosinophils relative 3, eosinophils absolute 0.2  08/03/2019-CT chest with contrast-signs of right lower lobectomy, pulmonary emphysema with centrilobular predominance is similar, mild interval enlargement of left axillary lymph node with mild hyperenhancement  05/09/2014-pulmonary function test-FVC 2.73 (82% predicted), postbronchodilator ratio 65, postbronchodilator FEV1 1.68 (65% predicted), no bronchodilator response, DLCO 15.53 (63% predicted)  FENO:  No results found for: NITRICOXIDE  PFT: PFT Results Latest Ref Rng & Units 05/09/2014  FVC-Pre L 2.73  FVC-Predicted Pre % 82  FVC-Post L 2.59  FVC-Predicted Post % 78  Pre FEV1/FVC % % 62  Post FEV1/FCV % % 65  FEV1-Pre L 1.68  FEV1-Predicted Pre % 65  FEV1-Post L 1.68  DLCO UNC% % 63  DLCO COR %Predicted % 68  TLC L 4.98  TLC % Predicted % 97  RV % Predicted % 105    WALK:  SIX  MIN WALK 07/11/2018  2 Minute Oxygen Saturation % 93  2 Minute HR 113  4 Minute Oxygen Saturation % 92  4 Minute HR 125  6 Minute Oxygen Saturation % 94  6 Minute HR 120    Imaging: No results found.  Lab Results:  CBC    Component Value Date/Time   WBC 7.2 08/03/2019 1048   WBC 6.0 07/25/2017 1005   RBC 4.65 08/03/2019 1048   HGB 14.0 08/03/2019 1048   HGB 14.2 01/19/2017 0958   HCT 42.8 08/03/2019 1048   HCT 42.8 01/19/2017 0958   PLT 258 08/03/2019 1048   PLT  265 01/19/2017 0958   MCV 92.0 08/03/2019 1048   MCV 90.8 01/19/2017 0958   MCH 30.1 08/03/2019 1048   MCHC 32.7 08/03/2019 1048   RDW 12.9 08/03/2019 1048   RDW 13.4 01/19/2017 0958   LYMPHSABS 1.6 08/03/2019 1048   LYMPHSABS 1.1 01/19/2017 0958   MONOABS 0.6 08/03/2019 1048   MONOABS 0.4 01/19/2017 0958   EOSABS 0.2 08/03/2019 1048   EOSABS 0.1 01/19/2017 0958   BASOSABS 0.1 08/03/2019 1048   BASOSABS 0.1 01/19/2017 0958    BMET    Component Value Date/Time   NA 140 08/03/2019 1048   NA 142 01/19/2017 0958   K 4.1 08/03/2019 1048   K 4.3 01/19/2017 0958   CL 107 08/03/2019 1048   CO2 25 08/03/2019 1048   CO2 25 01/19/2017 0958   GLUCOSE 98 08/03/2019 1048   GLUCOSE 101 01/19/2017 0958   BUN 15 08/03/2019 1048   BUN 15.2 01/19/2017 0958   CREATININE 0.78 08/03/2019 1048   CREATININE 0.8 01/19/2017 0958   CALCIUM 9.2 08/03/2019 1048   CALCIUM 9.5 01/19/2017 0958   GFRNONAA >60 08/03/2019 1048   GFRNONAA >89 08/08/2014 1439   GFRAA >60 08/03/2019 1048   GFRAA >89 08/08/2014 1439    BNP No results found for: BNP  ProBNP No results found for: PROBNP  Specialty Problems      Pulmonary Problems   COPD GOLD II B     01/24/2018-CT chest with contrast- stable exam, no new or progressive interval findings to suggest recurrent or metastatic disease, status post right lower lobectomy, emphysema  05/09/2014-pulmonary function test-FVC 2.73 (82% predicted), postbronchodilator ratio 65, FEV1 65, no bronchodilator response, DLCO 63       Cough   Primary cancer of right lower lobe of lung (HCC)   Shortness of breath      Allergies  Allergen Reactions  . Advil [Ibuprofen] Other (See Comments)    Causes hematuria   . Hydrocodone-Guaifenesin Itching  . Sulfa Antibiotics Hives and Swelling  . Temazepam Other (See Comments)    dizziness  . Plasticized Base [Plastibase] Rash    tape  . Zofran [Ondansetron Hcl] Palpitations    Immunization History  Administered  Date(s) Administered  . Fluad Quad(high Dose 65+) 04/11/2019  . Influenza Inj Mdck Quad With Preservative 04/12/2018  . Influenza Split 03/20/2018  . Influenza,inj,Quad PF,6+ Mos 03/10/2015, 02/15/2017  . Influenza-Unspecified 03/07/2014  . PFIZER SARS-COV-2 Vaccination 06/29/2019, 07/20/2019  . Pneumococcal-Unspecified 09/23/2013  . Tdap 09/28/2016, 04/06/2017  . Zoster Recombinat (Shingrix) 11/11/2017, 02/16/2018    Past Medical History:  Diagnosis Date  . Anxiety    husband also cancer pt  . Cancer ([place])    lung  . Complication of anesthesia    bronchospasms post-op (pulmonary did consult but pt was discharged later that day)  With last surg. she was given albuterol inhaler &  had steroid with surg.    . Congenital renal atrophy    left kidney, one spontaneous stone passed   . COPD (chronic obstructive pulmonary disease) ([place])   . Coronary artery disease   . Degenerative arthritis    spine, hands & knees   . GERD (gastroesophageal reflux disease)   . History of migraine   . Hypertension    h/o PVC- asymptomatic   . Memory disorder 03/29/2017  . 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 PONV (postoperative nausea and vomiting)   . PVC (premature ventricular contraction)   . Shortness of breath dyspnea    with exertion  . Thyroid nodule    benign by biopsy  . Varicose veins    left leg  . Visual disturbance    Episodic  . Vitamin D deficiency   . Wears glasses     Tobacco History: Social History   Tobacco Use  Smoking Status Former Smoker  . Packs/day: 0.50  . Years: 40.00  . Pack years: 20.00  . Types: Cigarettes  . Quit date: 03/17/2013  . Years since quitting: 6.5  Smokeless Tobacco Never Used   Counseling given: Yes   Continue to not smoke  Outpatient Encounter Medications as of 09/17/2019  Medication Sig  . albuterol (PROAIR HFA) 108 (90 Base) MCG/ACT inhaler Inhale 1 puff  into the lungs 3 (three) times daily as needed for wheezing or shortness of breath.  . nitroGLYCERIN (NITROSTAT) 0.4 MG SL tablet Place 1 tablet (0.4 mg total) under the tongue every 5 (five) minutes as needed for chest pain.  . rosuvastatin (CRESTOR) 20 MG tablet Take 1 tablet (20 mg total) by mouth daily.  . verapamil (CALAN-SR) 240 MG CR tablet Take 1 tablet (240 mg total) by mouth at bedtime.  . Vitamin D, Ergocalciferol, (DRISDOL) 50000 units CAPS capsule Take 50,000 Units by mouth every 7 (seven) days.  [name] Kitchen aspirin EC 81 MG tablet Take 1 tablet (81 mg total) by mouth daily. (Patient not taking: Reported on 09/17/2019)  . Tiotropium Bromide Monohydrate (SPIRIVA RESPIMAT) 2.5 MCG/ACT AERS Inhale 2 puffs into the lungs daily. (Patient not taking: Reported on 09/17/2019)  . Tiotropium Bromide-Olodaterol (STIOLTO RESPIMAT) 2.5-2.5 MCG/ACT AERS Inhale 2 puffs into the lungs daily.  . [DISCONTINUED] loratadine (CLARITIN) 10 MG tablet Take 10 mg by mouth daily. For limited time.   Facility-Administered Encounter Medications as of 09/17/2019  Medication  . Ampicillin-Sulbactam (UNASYN) 3 g in sodium chloride 0.9 % 100 mL IVPB     Review of Systems  Review of Systems  Constitutional: Negative for activity change, fatigue and fever.  HENT: Negative for sinus pressure, sinus pain and sore throat.   Respiratory: Positive for shortness of breath. Negative for cough and wheezing.   Cardiovascular: Negative for chest pain and palpitations.  Gastrointestinal: Negative for diarrhea, nausea and vomiting.  Musculoskeletal: Negative for arthralgias.  Neurological: Negative for dizziness.  Psychiatric/Behavioral: Negative for sleep disturbance. The patient is not nervous/anxious.      Physical Exam  BP 138/74 (BP Location: Left Arm, Patient Position: Sitting, Cuff Size: Normal)   Pulse 73   Temp 97.9 F (36.6 C) (Temporal)   Ht 5\' 5"  (1.651 m)   Wt 172 lb (78 kg)   SpO2 96%   BMI 28.62 kg/m   Wt  Readings from Last 5 Encounters:  09/17/19 172 lb (78 kg)  08/07/19 172 lb 6.4 oz (78.2  kg)  05/15/19 166 lb (75.3 kg)  03/13/19 169 lb 3.2 oz (76.7 kg)  01/31/19 167 lb 12.8 oz (76.1 kg)    BMI Readings from Last 5 Encounters:  09/17/19 28.62 kg/m  08/07/19 28.69 kg/m  05/15/19 27.62 kg/m  03/13/19 28.16 kg/m  01/31/19 27.92 kg/m     Physical Exam Vitals and nursing note reviewed.  Constitutional:      General: She is not in acute distress.    Appearance: Normal appearance.  HENT:     Head: Normocephalic and atraumatic.     Right Ear: External ear normal.     Left Ear: External ear normal.     Nose:     Comments: Deferred due to masking requirement    Mouth/Throat:     Comments: Deferred due to masking requirement Eyes:     Pupils: Pupils are equal, round, and reactive to light.  Cardiovascular:     Rate and Rhythm: Normal rate and regular rhythm.     Pulses: Normal pulses.     Heart sounds: Normal heart sounds. No murmur.  Pulmonary:     Effort: Pulmonary effort is normal. No respiratory distress.     Breath sounds: Normal breath sounds. No decreased air movement. No decreased breath sounds, wheezing or rales.  Abdominal:     General: Abdomen is flat. Bowel sounds are normal.     Palpations: Abdomen is soft.  Musculoskeletal:     Cervical back: Normal range of motion.  Skin:    General: Skin is warm and dry.     Capillary Refill: Capillary refill takes less than 2 seconds.  Neurological:     General: No focal deficit present.     Mental Status: She is alert and oriented to person, place, and time. Mental status is at baseline.     Gait: Gait (Tolerated walk in office, completed 3 laps, no abnormal gait) normal.  Psychiatric:        Mood and Affect: Mood normal.        Behavior: Behavior normal.        Thought Content: Thought content normal.        Judgment: Judgment normal.       Assessment & Plan:   COPD GOLD II B  2015 pulmonary function test  showing COPD stage II Patient felt about 10% improvement with Spiriva Respimat 2.5 trial Patient continues to have dyspnea on exertion  Plan: Trial of Stiolto Respimat Walk today in office Patient needs to go back to cardiology as well for further evaluation dyspnea on exertion -I have concerns that her dyspnea on exertion may be multifactorial and she may need to have a cardiac stent placed Pulmonary function testing ordered today Refer back to pulmonary rehab when they drop mask requirement for patients  Medication management Plan: Trial of Stiolto Respimat  Shortness of breath Patient reporting she has been evaluated by Dr. Tamala Julian in October/2020 and found to have 75% occlusion 1 her coronary arteries.  She reported the 80% she would require stent.  She has planned follow-up with him in May/2021.  She wanted to present to our office first.  She feels that her shortness of breath continues to persist despite the Spiriva Respimat use.  Only noticed about a 10% improvement with that.  2015 pulmonary function test does show COPD stage II.  We reviewed that PFT today in office.  Plan: Trial of Stiolto Respimat Go back to cardiology, I believe there is a likelihood that you will  require stent We will order repeat pulmonary function testing to compare to 2015 PFTs Refer back to pulmonary rehab since she found clinical improvement when they dropped the mask requirement     Return in about 2 months (around 11/17/2019), or if symptoms worsen or fail to improve, for Follow up with Dr. Lamonte Sakai, Follow up for PFT.   Lauraine Rinne, NP 09/17/2019   This appointment required 32 minutes of patient care (this includes precharting, chart review, review of results, face-to-face care, etc.).

## 2019-09-17 NOTE — Assessment & Plan Note (Addendum)
2015 pulmonary function test showing COPD stage II Patient felt about 10% improvement with Spiriva Respimat 2.5 trial Patient continues to have dyspnea on exertion  Plan: Trial of Stiolto Respimat Walk today in office Patient needs to go back to cardiology as well for further evaluation dyspnea on exertion -I have concerns that her dyspnea on exertion may be multifactorial and she may need to have a cardiac stent placed Pulmonary function testing ordered today Refer back to pulmonary rehab when they drop mask requirement for patients

## 2019-09-17 NOTE — Assessment & Plan Note (Signed)
Patient reporting she has been evaluated by Dr. Tamala Julian in October/2020 and found to have 75% occlusion 1 her coronary arteries.  She reported the 80% she would require stent.  She has planned follow-up with him in May/2021.  She wanted to present to our office first.  She feels that her shortness of breath continues to persist despite the Spiriva Respimat use.  Only noticed about a 10% improvement with that.  2015 pulmonary function test does show COPD stage II.  We reviewed that PFT today in office.  Plan: Trial of Stiolto Respimat Go back to cardiology, I believe there is a likelihood that you will require stent We will order repeat pulmonary function testing to compare to 2015 PFTs Refer back to pulmonary rehab since she found clinical improvement when they dropped the mask requirement

## 2019-09-17 NOTE — Assessment & Plan Note (Signed)
Plan: Trial of Stiolto Respimat

## 2019-09-17 NOTE — Patient Instructions (Addendum)
You were seen today by [name] Rinne, NP  for:   1. Chronic obstructive pulmonary disease, unspecified COPD type ([place])  - Pulmonary function test; Future  Trial of Stiolto Respimat inhaler >>>2 puffs daily >>>Take this no matter what >>>This is not a rescue inhaler  Call [name] [name] when you finish the first sample and let [name] [name] know how you are doing.  If you feel like this is providing clinical improvement please let [name] [name] know so we can send in a prescription for you  Note your daily symptoms > remember "red flags" for COPD:   >>>Increase in cough >>>increase in sputum production >>>increase in shortness of breath or activity  intolerance.   If you notice these symptoms, please call the office to be seen.    2. Medication management  Trial of Stiolto Respimat today  3. Shortness of breath  Keep follow-up with cardiology  Walk in office today   We recommend today:  Orders Placed This Encounter  Procedures  . Pulmonary function test    Standing Status:   Future    Standing Expiration Date:   09/16/2020    Order Specific Question:   Where should this test be performed?    Answer:   [facility]    Order Specific Question:   Full PFT: includes the following: basic spirometry, spirometry pre & post bronchodilator, diffusion capacity (DLCO), lung volumes    Answer:   Full PFT    Order Specific Question:   Release to patient    Answer:   Immediate   Orders Placed This Encounter  Procedures  . Pulmonary function test   Meds ordered this encounter  Medications  . Tiotropium Bromide-Olodaterol (STIOLTO RESPIMAT) 2.5-2.5 MCG/ACT AERS    Sig: Inhale 2 puffs into the lungs daily.    Dispense:  4 g    Refill:  0    Follow Up:    Return in about 2 months (around 11/17/2019), or if symptoms worsen or fail to improve, for Follow up with [name] [name], Follow up for PFT.  OV with RB (ok to place recall if no schedule available yet) with PFT   Please do your part to reduce the spread of  COVID-19:      Reduce your risk of any infection  and COVID19 by using the similar precautions used for avoiding the common cold or flu:  [name] Kitchen Wash your hands often with soap and warm water for at least 20 seconds.  If soap and water are not readily available, use an alcohol-based hand sanitizer with at least 60% alcohol.  . If coughing or sneezing, cover your mouth and nose by coughing or sneezing into the elbow areas of your shirt or coat, into a tissue or into your sleeve (not your hands). Langley Gauss A MASK when in public  . Avoid shaking hands with others and consider head nods or verbal greetings only. . Avoid touching your eyes, nose, or mouth with unwashed hands.  . Avoid close contact with people who are sick. . Avoid places or events with large numbers of people in one location, like concerts or sporting events. . If you have some symptoms but not all symptoms, continue to monitor at home and seek medical attention if your symptoms worsen. . If you are having a medical emergency, call 911.   Real / e-Visit: [email or web address]         [facility]: [place]  [facility]: [facility]: [phone]     It is flu season:   >>> Best ways to protect herself from the flu: Receive the yearly flu vaccine, practice good hand hygiene washing with soap and also using hand sanitizer when available, eat a nutritious meals, get adequate rest, hydrate appropriately   Please contact the office if your symptoms worsen or you have concerns that you are not improving.   Thank you for choosing [facility] for your healthcare, and for allowing [name] [name] to partner with you on your healthcare journey. I am thankful to be able to provide care to you today.   [name] Quaker FNP-C

## 2019-10-15 NOTE — Progress Notes (Signed)
Cardiology Office Note:    Date:  10/16/2019   ID:  [name] [name], DOB 02-Nov-1955, MRN [id number]  PCP:  [name], MD  Cardiologist:  Sinclair Grooms, MD   Referring MD: [name], MD   Chief Complaint  Patient presents with  . Coronary Artery Disease    History of Present Illness:    [name] [name] is a 64 y.o. female with a hx of asymptomatic CAD denoted by coronary calcification with obstructive RCA disease by Cor CTA, hyperlipidemia, hypertension, and prior smoking history. S/p surgical and radiation therapy of lung cancer.  [name] [name] use sublingual nitroglycerin twice in November 2020 because of early a.m. nausea and a vague sensation of chest discomfort.  These episodes were a relatively common morning occurrence and she decided to use nitroglycerin to see if it would help.  The nitroglycerin led to significant improvement/resolution of the discomfort within 5 minutes.  After the 2 occasions all of which she uses as she has not had any recurrence.  She has never had that type discomfort to occur with physical activity or at other times.  Her breathing has slightly improved with adjustments and bronchodilator therapy by Dr. Exie Parody.  Dyspnea on exertion is never been associated with chest discomfort.  She has never tried nitroglycerin for shortness of breath.  Past Medical History:  Diagnosis Date  . Anxiety    husband also cancer pt  . Cancer ([place])    lung  . Complication of anesthesia    bronchospasms post-op (pulmonary did consult but pt was discharged later that day)  With last surg. she was given albuterol inhaler & had steroid with surg.    . Congenital renal atrophy    left kidney, one spontaneous stone passed   . COPD (chronic obstructive pulmonary disease) ([place])   . Coronary artery disease   . Degenerative arthritis    spine, hands & knees   . GERD (gastroesophageal reflux disease)   . History of migraine   . Hypertension    h/o PVC-  asymptomatic   . Memory disorder 03/29/2017  . 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 PONV (postoperative nausea and vomiting)   . PVC (premature ventricular contraction)   . Shortness of breath dyspnea    with exertion  . Thyroid nodule    benign by biopsy  . Varicose veins    left leg  . Visual disturbance    Episodic  . Vitamin D deficiency   . Wears glasses     Past Surgical History:  Procedure Laterality Date  . CARPAL TUNNEL RELEASE Left   . CESAREAN SECTION    . COLONOSCOPY W/ BIOPSIES AND POLYPECTOMY    . DILATION AND CURETTAGE OF UTERUS    . TONSILLECTOMY    . VARICOSE VEIN SURGERY     left  . VIDEO ASSISTED THORACOSCOPY (VATS)/ LOBECTOMY Right 07/23/2014   Procedure: VIDEO ASSISTED THORACOSCOPY (VATS)/ LOBECTOMY;  Surgeon: Ivin Poot, MD;  Location: [facility] [place];  [name]: Thoracic;  Laterality: Right;  [name] Kitchen VIDEO BRONCHOSCOPY N/A 07/23/2014   Procedure: VIDEO BRONCHOSCOPY;  Surgeon: Ivin Poot, MD;  Location: [facility];  [name]: Thoracic;  Laterality: N/A;  . VIDEO BRONCHOSCOPY WITH ENDOBRONCHIAL ULTRASOUND Right 04/17/2014   Procedure: VIDEO BRONCHOSCOPY WITH ENDOBRONCHIAL ULTRASOUND;  Surgeon: Collene Gobble, MD;  Location: [facility];  [name]: Thoracic;  Laterality: Right;  [name] Kitchen VIDEO BRONCHOSCOPY WITH ENDOBRONCHIAL ULTRASOUND  N/A 07/08/2014   Procedure: VIDEO BRONCHOSCOPY WITH ENDOBRONCHIAL ULTRASOUND;  Surgeon: Ivin Poot, MD;  Location: [facility] OR;  [name]: Thoracic;  Laterality: N/A;    Current Medications: Current Meds  Medication Sig  . albuterol (PROAIR HFA) 108 (90 Base) MCG/ACT inhaler Inhale 1 puff into the lungs 3 (three) times daily as needed for wheezing or shortness of breath.  [name] Kitchen aspirin EC 81 MG tablet Take 1 tablet (81 mg total) by mouth daily.  . nitroGLYCERIN (NITROSTAT) 0.4 MG SL tablet Place 1 tablet (0.4 mg total) under the tongue every 5 (five) minutes as needed for chest pain.   . rosuvastatin (CRESTOR) 20 MG tablet Take 1 tablet (20 mg total) by mouth daily.  . Tiotropium Bromide-Olodaterol (STIOLTO RESPIMAT) 2.5-2.5 MCG/ACT AERS Inhale 2 puffs into the lungs daily.  . verapamil (CALAN-SR) 240 MG CR tablet Take 1 tablet (240 mg total) by mouth at bedtime.  . Vitamin D, Ergocalciferol, (DRISDOL) 50000 units CAPS capsule Take 50,000 Units by mouth every 7 (seven) days.   Current Facility-Administered Medications for the 10/16/19 encounter (Office Visit) with Belva Crome, MD  Medication  . Ampicillin-Sulbactam (UNASYN) 3 g in sodium chloride 0.9 % 100 mL IVPB     Allergies:   Advil [ibuprofen], Hydrocodone-guaifenesin, Sulfa antibiotics, Temazepam, Plasticized base [plastibase], and Zofran [ondansetron hcl]   Social History   Socioeconomic History  . Marital status: Widowed    Spouse name: Not on file  . Number of children: 1  . Years of education: 104  . Highest education level: Not on file  Occupational History  . Occupation: Nurse    Employer: [employer]  Tobacco Use  . Smoking status: Former Smoker    Packs/day: 0.50    Years: 40.00    Pack years: 20.00    Types: Cigarettes    Quit date: 03/17/2013    Years since quitting: 6.5  . Smokeless tobacco: Never Used  Substance and Sexual Activity  . Alcohol use: Yes    Alcohol/week: 0.0 standard drinks    Comment: " rare"  . Drug use: No  . Sexual activity: Not on file  Other Topics Concern  . Not on file  Social History Narrative   Lives alone. Husband passed away 10/07/16   Caffeine use: 1-2 cups coffee per day   Right handed    Social Determinants of Health   Financial Resource Strain:   . Difficulty of Paying Living Expenses:   Food Insecurity:   . Worried About Charity fundraiser in the Last Year:   . Arboriculturist in the Last Year:   Transportation Needs:   . Film/video editor (Medical):   [name] Kitchen Lack of Transportation (Non-Medical):   Physical Activity:   . Days of Exercise  per Week:   . Minutes of Exercise per Session:   Stress:   . Feeling of Stress :   Social Connections:   . Frequency of Communication with Friends and Family:   . Frequency of Social Gatherings with Friends and Family:   . Attends Religious Services:   . Active Member of Clubs or Organizations:   . Attends Archivist Meetings:   [name] Kitchen Marital Status:      Family History: The patient's family history includes Atrial fibrillation in her father; Cancer in her mother; Diverticulitis in her brother and father; Headache in her sister; Hypertension in her father, sister, and sister.  ROS:   Please see the history of present illness.  She does not have cough, weight loss, or other complaints.  She is concerned about her daughter Danae Chen who has hypothyroidism but has an appointment set for 3 months in the future with Dr. Buddy Duty.  She would like for her to be able to see Dr. Buddy Duty earlier.  Also had an episode of severe left shoulder discomfort 10 days ago that lasted approximately an hour and then resolved.  Moving the shoulder made the discomfort worse.  All other systems reviewed and are negative.  EKGs/Labs/Other Studies Reviewed:    The following studies were reviewed today: No new imaging data  EKG:  EKG not performed  Recent Labs: 08/03/2019: ALT 17; BUN 15; Creatinine 0.78; Hemoglobin 14.0; Platelet Count 258; Potassium 4.1; Sodium 140  Recent Lipid Panel    Component Value Date/Time   CHOL 148 04/30/2019 1305   TRIG 64 04/30/2019 1305   HDL 82 04/30/2019 1305   CHOLHDL 1.8 04/30/2019 1305   LDLCALC 53 04/30/2019 1305    Physical Exam:    VS:  BP 122/76   Pulse 93   Ht 5\' 5"  (1.651 m)   Wt 173 lb 6.4 oz (78.7 kg)   SpO2 99%   BMI 28.86 kg/m     Wt Readings from Last 3 Encounters:  10/16/19 173 lb 6.4 oz (78.7 kg)  09/17/19 172 lb (78 kg)  08/07/19 172 lb 6.4 oz (78.2 kg)     GEN: Slightly overweight. No acute distress HEENT: Normal NECK: No JVD. LYMPHATICS:  No lymphadenopathy CARDIAC:  RRR without murmur, gallop, or edema. VASCULAR:  Normal Pulses. No bruits. RESPIRATORY:  Clear to auscultation without rales, wheezing or rhonchi  ABDOMEN: Soft, non-tender, non-distended, No pulsatile mass, MUSCULOSKELETAL: No deformity  SKIN: Warm and dry NEUROLOGIC:  Alert and oriented x 3 PSYCHIATRIC:  Normal affect   ASSESSMENT:    1. Coronary artery disease of native artery of native heart with stable angina pectoris ([place])   2. Hyperlipidemia with target LDL less than 70   3. Chronic obstructive pulmonary disease, unspecified COPD type ([place])   4. Frequent PVCs   5. Hx of tobacco use, presenting hazards to health   6. Educated about COVID-19 virus infection    PLAN:    In order of problems listed above:  1. Prevention is discussed.  30 minutes of moderate activity 5 out of 7 days of each week to achieve a total of 150 minutes of moderate activity per week.  Lipid-lowering to LDL less than 70. 2. Target LDL less than 70.  Continue Crestor 20 mg/day. 3. Continue bronchodilator therapy.  I believe dyspnea is predominantly related to COPD and not cardiac. 4. No clinical issues. 5. Not smoking. 6. Received COVID-19 vaccine and social distancing.   Medication Adjustments/Labs and Tests Ordered: Current medicines are reviewed at length with the patient today.  Concerns regarding medicines are outlined above.  No orders of the defined types were placed in this encounter.  No orders of the defined types were placed in this encounter.   Patient Instructions  Medication Instructions:  Your physician recommends that you continue on your current medications as directed. Please refer to the Current Medication list given to you today.  *If you need a refill on your cardiac medications before your next appointment, please call your pharmacy*   Lab Work: None If you have labs (blood work) drawn today and your tests are completely normal, you will  receive your results only by: [name] Kitchen MyChart Message (if you have MyChart)  OR . A paper copy in the mail If you have any lab test that is abnormal or we need to change your treatment, we will call you to review the results.   Testing/Procedures: None   Follow-Up: At [facility], you and your health needs are our priority.  As part of our continuing mission to provide you with exceptional heart care, we have created designated Provider Care Teams.  These Care Teams include your primary Cardiologist (physician) and Advanced Practice Providers (APPs -  Physician Assistants and Nurse Practitioners) who all work together to provide you with the care you need, when you need it.  We recommend signing up for the patient portal called "MyChart".  Sign up information is provided on this After Visit Summary.  MyChart is used to connect with patients for Virtual Visits (Telemedicine).  Patients are able to view lab/test results, encounter notes, upcoming appointments, etc.  Non-urgent messages can be sent to your provider as well.   To learn more about what you can do with MyChart, go to [email or web address].    Your next appointment:   6 month(s)  The format for your next appointment:   In Person  Provider:   You may see Sinclair Grooms, MD or one of the following Advanced Practice Providers on your designated Care Team:    Truitt Merle, NP  Cecilie Kicks, NP  Kathyrn Drown, NP    Other Instructions      Signed, Sinclair Grooms, MD  10/16/2019 5:35 PM    [place]

## 2019-10-16 ENCOUNTER — Encounter: Payer: Self-pay | Admitting: Interventional Cardiology

## 2019-10-16 ENCOUNTER — Ambulatory Visit (INDEPENDENT_AMBULATORY_CARE_PROVIDER_SITE_OTHER): Payer: Medicare Other | Admitting: Interventional Cardiology

## 2019-10-16 ENCOUNTER — Telehealth: Payer: Self-pay | Admitting: Emergency Medicine

## 2019-10-16 ENCOUNTER — Other Ambulatory Visit: Payer: Self-pay

## 2019-10-16 VITALS — BP 122/76 | HR 93 | Ht 65.0 in | Wt 173.4 lb

## 2019-10-16 DIAGNOSIS — J449 Chronic obstructive pulmonary disease, unspecified: Secondary | ICD-10-CM

## 2019-10-16 DIAGNOSIS — I493 Ventricular premature depolarization: Secondary | ICD-10-CM

## 2019-10-16 DIAGNOSIS — Z7189 Other specified counseling: Secondary | ICD-10-CM | POA: Diagnosis not present

## 2019-10-16 DIAGNOSIS — I25118 Atherosclerotic heart disease of native coronary artery with other forms of angina pectoris: Secondary | ICD-10-CM | POA: Diagnosis not present

## 2019-10-16 DIAGNOSIS — Z87891 Personal history of nicotine dependence: Secondary | ICD-10-CM

## 2019-10-16 DIAGNOSIS — E785 Hyperlipidemia, unspecified: Secondary | ICD-10-CM | POA: Diagnosis not present

## 2019-10-16 MED ORDER — STIOLTO RESPIMAT 2.5-2.5 MCG/ACT IN AERS: 2.0000 | INHALATION_SPRAY | Freq: Every day | RESPIRATORY_TRACT | 2 refills | Status: DC

## 2019-10-16 NOTE — Patient Instructions (Signed)

## 2019-10-16 NOTE — Telephone Encounter (Signed)
Spoke with pt and advised rx sent to pharmacy. Nothing further is needed.      Assessment & Plan Note by Lauraine Rinne, NP at 09/17/2019 3:01 PM Author: Lauraine Rinne, NP Author Type: Nurse Practitioner Filed: 09/17/2019 3:02 PM  Note Status: [place] List: Cosign Not Required Encounter Date: 09/17/2019  Problem: COPD GOLD II B   Editor: Lauraine Rinne, NP (Nurse Practitioner)  Prior Versions: 1. Lauraine Rinne, NP (Nurse Practitioner) at 09/17/2019 3:02 PM - Written    [phone] pulmonary function test showing COPD stage II Patient felt about 10% improvement with Spiriva Respimat 2.5 trial Patient continues to have dyspnea on exertion  Plan: Trial of Stiolto Respimat Walk today in office Patient needs to go back to cardiology as well for further evaluation dyspnea on exertion -I have concerns that her dyspnea on exertion may be multifactorial and she may need to have a cardiac stent placed Pulmonary function testing ordered today Refer back to pulmonary rehab when they drop mask requirement for patients

## 2019-10-17 ENCOUNTER — Telehealth: Payer: Self-pay | Admitting: Emergency Medicine

## 2019-10-17 NOTE — Telephone Encounter (Signed)
Spoke with patient. She stated that the Stiolto actually worked better than the Spiriva. She requested a RX and found out at the pharmacy that her copay was $400. She wanted to know if there was a generic option.   I advised her that there was not a generic for Stiolto and we need to see her formulary to find a lower tier medication. She stated that her PBM is through [facility].   Found her formulary. Stiolto is a preferred medication but it is listed as a tier 3 medication. Per her formulary, all of the inhalers are listed as a tier 3.   Checked for [employer] as well. They are listed as tier 4 medications.   RB, please advise. Thanks!

## 2019-10-18 NOTE — Telephone Encounter (Signed)
LMTCB

## 2019-10-18 NOTE — Telephone Encounter (Signed)
It sounds like all of the comparable inhalers are on the same tier w her insurance. If she cannot afford the stiolto can we see if there is assistance from the company??

## 2019-10-19 NOTE — Telephone Encounter (Signed)
Can you please advise if there is any inhaler patient assistance for this patient. All inhalers for her are on the same tier costing her about $400.

## 2019-10-22 ENCOUNTER — Telehealth: Payer: Self-pay | Admitting: Emergency Medicine

## 2019-10-22 NOTE — Telephone Encounter (Signed)
For [employer], It would be [employer] patient assistance program

## 2019-10-22 NOTE — Telephone Encounter (Signed)
Left message for patient to call back  

## 2019-10-22 NOTE — Telephone Encounter (Signed)
Called and spoke with patient. She states that if the patient assistance paperwork is based off of income then she will not qualify they will say that she makes to much money. Patient is asking if there is a generic of Stiolto informed her there is not. She then asked if there is a different inhaler. Informed her that according to her formulary all inhalers fall under the same tier. Patient stated she has has about 10 days left of her Stiolto and wanted to know what she is supposed to do. Advised that we could give her some samples to pick up at the [facility] office. Also offered to make her an appointment with pharmacy team. Patient stated that she is going out of town for about a week and will think about her options. She stated that she will think about patient assistance because she is not sure they will qualify her with her income, she would not be able to come pick up samples or meet with pharmacy team because she is going out of town and says that she thinks she will just go off inhaler for now and see how she does without it and if she needs anything she will call the office. Nothing further needed at this time.

## 2019-10-23 NOTE — Telephone Encounter (Signed)
LMTCB x2 for pt 

## 2019-10-23 NOTE — Telephone Encounter (Signed)
Pt returning a phone call. Pt can be reached at [phone].

## 2019-10-23 NOTE — Telephone Encounter (Signed)
Called and spoke with pt who stated she was going to stay on Stiolto until next OV. Pt is new to medicare and did not know that she had a deductible to be met prior to the copay being lower. Nothing further needed.

## 2019-11-12 DIAGNOSIS — E538 Deficiency of other specified B group vitamins: Secondary | ICD-10-CM | POA: Diagnosis not present

## 2019-11-12 DIAGNOSIS — E7849 Other hyperlipidemia: Secondary | ICD-10-CM | POA: Diagnosis not present

## 2019-11-12 DIAGNOSIS — M859 Disorder of bone density and structure, unspecified: Secondary | ICD-10-CM | POA: Diagnosis not present

## 2019-11-16 DIAGNOSIS — R946 Abnormal results of thyroid function studies: Secondary | ICD-10-CM | POA: Diagnosis not present

## 2019-11-19 DIAGNOSIS — R82998 Other abnormal findings in urine: Secondary | ICD-10-CM | POA: Diagnosis not present

## 2019-11-20 ENCOUNTER — Other Ambulatory Visit: Payer: Self-pay

## 2019-11-20 ENCOUNTER — Ambulatory Visit
Admission: RE | Admit: 2019-11-20 | Discharge: 2019-11-20 | Disposition: A | Discharge status: Home / Self Care | Payer: Medicare Other | Source: Ambulatory Visit | Attending: Internal Medicine | Admitting: Internal Medicine

## 2019-11-20 DIAGNOSIS — R599 Enlarged lymph nodes, unspecified: Secondary | ICD-10-CM

## 2019-11-20 DIAGNOSIS — R590 Localized enlarged lymph nodes: Secondary | ICD-10-CM | POA: Diagnosis not present

## 2019-11-23 ENCOUNTER — Other Ambulatory Visit (HOSPITAL_COMMUNITY)
Admission: RE | Admit: 2019-11-23 | Discharge: 2019-11-23 | Disposition: A | Discharge status: Home / Self Care | Payer: Medicare Other | Source: Ambulatory Visit | Attending: Emergency Medicine | Admitting: Emergency Medicine

## 2019-11-23 DIAGNOSIS — Z01812 Encounter for preprocedural laboratory examination: Secondary | ICD-10-CM | POA: Insufficient documentation

## 2019-11-23 DIAGNOSIS — Z20822 Contact with and (suspected) exposure to covid-19: Secondary | ICD-10-CM | POA: Insufficient documentation

## 2019-11-23 LAB — SARS CORONAVIRUS 2 (TAT 6-24 HRS): SARS Coronavirus 2: NEGATIVE

## 2019-11-26 ENCOUNTER — Ambulatory Visit (INDEPENDENT_AMBULATORY_CARE_PROVIDER_SITE_OTHER): Payer: Medicare Other | Admitting: Emergency Medicine

## 2019-11-26 ENCOUNTER — Other Ambulatory Visit: Payer: Self-pay

## 2019-11-26 ENCOUNTER — Encounter: Payer: Self-pay | Admitting: Emergency Medicine

## 2019-11-26 DIAGNOSIS — J449 Chronic obstructive pulmonary disease, unspecified: Secondary | ICD-10-CM

## 2019-11-26 DIAGNOSIS — I25118 Atherosclerotic heart disease of native coronary artery with other forms of angina pectoris: Secondary | ICD-10-CM

## 2019-11-26 LAB — PULMONARY FUNCTION TEST
DL/VA % pred: 83 %
DL/VA: 3.48 ml/min/mmHg/L
DLCO cor % pred: 74 %
DLCO cor: 14.88 ml/min/mmHg
DLCO unc % pred: 74 %
DLCO unc: 14.88 ml/min/mmHg
FEF 25-75 Post: 0.96 L/sec
FEF 25-75 Pre: 0.69 L/sec
FEF2575-%Change-Post: 38 %
FEF2575-%Pred-Post: 45 %
FEF2575-%Pred-Pre: 32 %
FEV1-%Change-Post: 12 %
FEV1-%Pred-Post: 71 %
FEV1-%Pred-Pre: 63 %
FEV1-Post: 1.72 L
FEV1-Pre: 1.53 L
FEV1FVC-%Change-Post: 10 %
FEV1FVC-%Pred-Pre: 74 %
FEV6-%Change-Post: 4 %
FEV6-%Pred-Post: 89 %
FEV6-%Pred-Pre: 86 %
FEV6-Post: 2.72 L
FEV6-Pre: 2.61 L
FEV6FVC-%Change-Post: 2 %
FEV6FVC-%Pred-Post: 103 %
FEV6FVC-%Pred-Pre: 101 %
FVC-%Change-Post: 1 %
FVC-%Pred-Post: 86 %
FVC-%Pred-Pre: 85 %
FVC-Post: 2.73 L
FVC-Pre: 2.69 L
Post FEV1/FVC ratio: 63 %
Post FEV6/FVC ratio: 100 %
Pre FEV1/FVC ratio: 57 %
Pre FEV6/FVC Ratio: 97 %
RV % pred: 134 %
RV: 2.85 L
TLC % pred: 111 %
TLC: 5.65 L

## 2019-11-26 NOTE — Patient Instructions (Addendum)
Agree with working on your exercise and conditioning. We could try to refer you back to pulmonary rehab if you can do this without a mask. We can check on this.  We will stop Stiolto for now Keep albuterol available to use 2 puffs up to every 4 hours if needed for shortness of breath, chest tightness, wheezing.  COVID 19 vaccine up to date.  Follow with Dr Lamonte Sakai in 4 months or sooner if you have any problems.

## 2019-11-26 NOTE — Progress Notes (Signed)
Full PFT performed today. °

## 2019-11-26 NOTE — Progress Notes (Signed)
Subjective:    Patient ID: [name] [name], female    DOB: Apr 25, 1956, 64 y.o.   MRN: [id number]  COPD She complains of cough and shortness of breath. There is no wheezing. Pertinent negatives include no ear pain, fever, headaches, postnasal drip, rhinorrhea, sneezing, sore throat or trouble swallowing. Her past medical history is significant for COPD.    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  She also has coronary artery disease, underwent CT angiogram that identified proximal right coronary obstruction, patent LAD and circumflex.  Currently managed on albuterol which she uses approximately.  Most recent PFT were in 2015, showed moderately severe obstruction. She has been having more exertional dyspnea with house work since February, not associated with CP, wheeze or cough.  Last flare, abx given in 05/2018.  Most recent dedicated chest imaging 01/24/2019, without evidence of any recurrent disease.  ROV 11/26/2019 --follow-up visit for 64 year old former smoker with COPD, history of right lower lobectomy for adenocarcinoma with neoadjuvant chemotherapy, CAD.  I tried her on Spiriva last year but she continued to have dyspnea, more recently she was changed to [facility] in April.  She reports that the Stiolto has helped her breathing some, less trouble with SOB during hot / humid air. She has been quite sedentary for about a year so it is tough to gauge her exertional SOB. She is not wheezing, coughing, having any chest tightness. Rare albuterol use.   Her cardiology evaluations have been overall reassuring, saw Dr Tamala Julian in May. Her daughter has moved back home - now has 5 cats.    MDM: Reviewed cardiology notes 10/16/2019 PFT from today.    Review of Systems  Constitutional: Negative for fever and unexpected weight change.  HENT: Negative for congestion, dental  problem, ear pain, nosebleeds, postnasal drip, rhinorrhea, sinus pressure, sneezing, sore throat and trouble swallowing.   Eyes: Negative for redness and itching.  Respiratory: Positive for cough and shortness of breath. Negative for chest tightness and wheezing.   Cardiovascular: Negative for palpitations and leg swelling.  Gastrointestinal: Negative for nausea and vomiting.  Genitourinary: Negative for dysuria.  Musculoskeletal: Negative for joint swelling.  Skin: Negative for rash.  Neurological: Negative for headaches.  Hematological: Does not bruise/bleed easily.  Psychiatric/Behavioral: Negative for dysphoric mood. The patient is not nervous/anxious.        Objective:   Physical Exam Vitals:   11/26/19 1204  BP: (!) 142/82  Pulse: 84  Temp: 98.1 F (36.7 C)  TempSrc: Oral  SpO2: 95%  Weight: 176 lb (79.8 kg)  Height: 5\' 5"  (1.651 m)   Gen: Pleasant, well-nourished, in no distress,  normal affect  ENT: No lesions,  mouth clear,  oropharynx clear, no postnasal drip, no stridor  Lungs: No use of accessory muscles, no crackles, no active wheezing  Cardiovascular: RRR, heart sounds normal, no murmur or gallops, no peripheral edema  Musculoskeletal: No deformities, no cyanosis or clubbing  Neuro: alert, non focal  Skin: Warm, no lesions or rashes       Assessment & Plan:  COPD GOLD II B  Moderately severe obstruction on her pulmonary function testing today.  FEV1 65% predicted, about the same as in 2015 before her lung surgery.  She is not sure that the Stiolto or Spiriva have given her significant benefit.  She wants to stop for now and work on cardiopulmonary conditioning.  I agree with this plan.  She will let me know if she misses the schedule bronchodilator.  Keep albuterol available.  Agree with working on your exercise and conditioning. We could try to refer you back to pulmonary rehab if you can do this without a mask. We can check on this.  We will stop  Stiolto for now Keep albuterol available to use 2 puffs up to every 4 hours if needed for shortness of breath, chest tightness, wheezing.  COVID 19 vaccine up to date.  Follow with Dr Lamonte Sakai in 4 months or sooner if you have any problems.  Baltazar Apo, MD, PhD 11/26/2019, 12:46 PM [facility] [phone] or if no answer [phone]

## 2019-11-26 NOTE — Assessment & Plan Note (Signed)
Moderately severe obstruction on her pulmonary function testing today.  FEV1 65% predicted, about the same as in 2015 before her lung surgery.  She is not sure that the Stiolto or Spiriva have given her significant benefit.  She wants to stop for now and work on cardiopulmonary conditioning.  I agree with this plan.  She will let me know if she misses the schedule bronchodilator.  Keep albuterol available.  Agree with working on your exercise and conditioning. We could try to refer you back to pulmonary rehab if you can do this without a mask. We can check on this.  We will stop Stiolto for now Keep albuterol available to use 2 puffs up to every 4 hours if needed for shortness of breath, chest tightness, wheezing.  COVID 19 vaccine up to date.  Follow with Dr Lamonte Sakai in 4 months or sooner if you have any problems.

## 2020-01-17 DIAGNOSIS — D1801 Hemangioma of skin and subcutaneous tissue: Secondary | ICD-10-CM | POA: Diagnosis not present

## 2020-01-17 DIAGNOSIS — D2271 Melanocytic nevi of right lower limb, including hip: Secondary | ICD-10-CM | POA: Diagnosis not present

## 2020-01-17 DIAGNOSIS — L814 Other melanin hyperpigmentation: Secondary | ICD-10-CM | POA: Diagnosis not present

## 2020-01-17 DIAGNOSIS — D225 Melanocytic nevi of trunk: Secondary | ICD-10-CM | POA: Diagnosis not present

## 2020-01-17 DIAGNOSIS — L603 Nail dystrophy: Secondary | ICD-10-CM | POA: Diagnosis not present

## 2020-01-17 DIAGNOSIS — L821 Other seborrheic keratosis: Secondary | ICD-10-CM | POA: Diagnosis not present

## 2020-02-05 ENCOUNTER — Ambulatory Visit
Admission: RE | Admit: 2020-02-05 | Discharge: 2020-02-05 | Disposition: A | Discharge status: Home / Self Care | Payer: Medicare Other | Source: Ambulatory Visit | Attending: Internal Medicine | Admitting: Internal Medicine

## 2020-02-05 ENCOUNTER — Other Ambulatory Visit: Payer: Self-pay

## 2020-02-05 ENCOUNTER — Inpatient Hospital Stay: Payer: Medicare Other | Attending: Internal Medicine

## 2020-02-05 DIAGNOSIS — C3490 Malignant neoplasm of unspecified part of unspecified bronchus or lung: Secondary | ICD-10-CM

## 2020-02-05 DIAGNOSIS — C3431 Malignant neoplasm of lower lobe, right bronchus or lung: Secondary | ICD-10-CM | POA: Diagnosis not present

## 2020-02-05 DIAGNOSIS — I700 Atherosclerosis of aorta: Secondary | ICD-10-CM | POA: Diagnosis not present

## 2020-02-05 DIAGNOSIS — Z85118 Personal history of other malignant neoplasm of bronchus and lung: Secondary | ICD-10-CM | POA: Insufficient documentation

## 2020-02-05 DIAGNOSIS — J432 Centrilobular emphysema: Secondary | ICD-10-CM | POA: Diagnosis not present

## 2020-02-05 DIAGNOSIS — I2510 Atherosclerotic heart disease of native coronary artery without angina pectoris: Secondary | ICD-10-CM | POA: Diagnosis not present

## 2020-02-05 LAB — CMP (CANCER CENTER ONLY)
ALT: 11 U/L (ref 0–44)
AST: 16 U/L (ref 15–41)
Albumin: 3.9 g/dL (ref 3.5–5.0)
Alkaline Phosphatase: 109 U/L (ref 38–126)
Anion gap: 5 (ref 5–15)
BUN: 12 mg/dL (ref 8–23)
CO2: 27 mmol/L (ref 22–32)
Calcium: 10.1 mg/dL (ref 8.9–10.3)
Chloride: 108 mmol/L (ref 98–111)
Creatinine: 0.8 mg/dL (ref 0.44–1.00)
GFR, Est AFR Am: >60 mL/min (ref >60)
GFR, Estimated: >60 mL/min (ref >60)
Glucose, Bld: 104 mg/dL — ABNORMAL HIGH (ref 70–99)
Potassium: 4.6 mmol/L (ref 3.5–5.1)
Sodium: 140 mmol/L (ref 135–145)
Total Bilirubin: 0.9 mg/dL (ref 0.3–1.2)
Total Protein: 7 g/dL (ref 6.5–8.1)

## 2020-02-05 LAB — CBC WITH DIFFERENTIAL (CANCER CENTER ONLY)
Abs Immature Granulocytes: 0.01 10*3/uL (ref 0.00–0.07)
Basophils Absolute: 0.1 10*3/uL (ref 0.0–0.1)
Basophils Relative: 1 %
Eosinophils Absolute: 0.2 10*3/uL (ref 0.0–0.5)
Eosinophils Relative: 3 %
HCT: 41.8 % (ref 36.0–46.0)
Hemoglobin: 13.5 g/dL (ref 12.0–15.0)
Immature Granulocytes: 0 %
Lymphocytes Relative: 19 %
Lymphs Abs: 1.3 10*3/uL (ref 0.7–4.0)
MCH: 29.7 pg (ref 26.0–34.0)
MCHC: 32.3 g/dL (ref 30.0–36.0)
MCV: 91.9 fL (ref 80.0–100.0)
Monocytes Absolute: 0.5 10*3/uL (ref 0.1–1.0)
Monocytes Relative: 8 %
Neutro Abs: 4.7 10*3/uL (ref 1.7–7.7)
Neutrophils Relative %: 69 %
Platelet Count: 254 10*3/uL (ref 150–400)
RBC: 4.55 MIL/uL (ref 3.87–5.11)
RDW: 12.6 % (ref 11.5–15.5)
WBC Count: 6.8 10*3/uL (ref 4.0–10.5)
nRBC: 0 % (ref 0.0–0.2)

## 2020-02-05 MED ORDER — IOPAMIDOL (ISOVUE-370) INJECTION 76%
75.0000 mL | Freq: Once | INTRAVENOUS | Status: AC | PRN
  Administered 2020-02-05: 75 mL via INTRAVENOUS

## 2020-02-07 DIAGNOSIS — S8011XA Contusion of right lower leg, initial encounter: Secondary | ICD-10-CM | POA: Diagnosis not present

## 2020-02-12 ENCOUNTER — Other Ambulatory Visit: Payer: Self-pay

## 2020-02-12 ENCOUNTER — Encounter: Payer: Self-pay | Admitting: Internal Medicine

## 2020-02-12 ENCOUNTER — Telehealth: Payer: Self-pay | Admitting: Internal Medicine

## 2020-02-12 ENCOUNTER — Inpatient Hospital Stay: Payer: Medicare Other | Attending: Internal Medicine | Admitting: Internal Medicine

## 2020-02-12 VITALS — BP 136/80 | HR 74 | Temp 97.9°F | Resp 20 | Ht 65.0 in | Wt 175.3 lb

## 2020-02-12 DIAGNOSIS — K219 Gastro-esophageal reflux disease without esophagitis: Secondary | ICD-10-CM | POA: Insufficient documentation

## 2020-02-12 DIAGNOSIS — C3490 Malignant neoplasm of unspecified part of unspecified bronchus or lung: Secondary | ICD-10-CM | POA: Diagnosis not present

## 2020-02-12 DIAGNOSIS — C3431 Malignant neoplasm of lower lobe, right bronchus or lung: Secondary | ICD-10-CM

## 2020-02-12 DIAGNOSIS — Z902 Acquired absence of lung [part of]: Secondary | ICD-10-CM | POA: Insufficient documentation

## 2020-02-12 DIAGNOSIS — Z7982 Long term (current) use of aspirin: Secondary | ICD-10-CM | POA: Insufficient documentation

## 2020-02-12 DIAGNOSIS — J449 Chronic obstructive pulmonary disease, unspecified: Secondary | ICD-10-CM | POA: Insufficient documentation

## 2020-02-12 DIAGNOSIS — Z923 Personal history of irradiation: Secondary | ICD-10-CM | POA: Insufficient documentation

## 2020-02-12 DIAGNOSIS — Z79899 Other long term (current) drug therapy: Secondary | ICD-10-CM | POA: Diagnosis not present

## 2020-02-12 DIAGNOSIS — I10 Essential (primary) hypertension: Secondary | ICD-10-CM | POA: Diagnosis not present

## 2020-02-12 DIAGNOSIS — Z9221 Personal history of antineoplastic chemotherapy: Secondary | ICD-10-CM | POA: Insufficient documentation

## 2020-02-12 DIAGNOSIS — I2510 Atherosclerotic heart disease of native coronary artery without angina pectoris: Secondary | ICD-10-CM | POA: Diagnosis not present

## 2020-02-12 DIAGNOSIS — I25118 Atherosclerotic heart disease of native coronary artery with other forms of angina pectoris: Secondary | ICD-10-CM

## 2020-02-12 DIAGNOSIS — Z85118 Personal history of other malignant neoplasm of bronchus and lung: Secondary | ICD-10-CM | POA: Diagnosis not present

## 2020-02-12 NOTE — Addendum Note (Signed)
Addended by: Ardeen Garland on: 02/12/2020 02:06 PM   Modules accepted: Orders

## 2020-02-12 NOTE — Telephone Encounter (Signed)
Scheduled appointments per 9/7 los. Gave patient calendar print out.

## 2020-02-12 NOTE — Progress Notes (Signed)
[place] [phone]   [phone]  OFFICE PROGRESS NOTE  [name], [place] [address]  DIAGNOSIS: Primary cancer of right lower lobe of lung  Staging form: Lung, AJCC 7th Edition  Clinical: Stage IIIA (T2a, N2, M0) - Unsigned  Staging comments: Adenocarcinoma  Foundation 1 molecular studies: positive for [id number], KRASG13C, [id number]*, [id number]*11, GATA6 amplification and [id number] Negative for BRAF, ALK, MET, RET and ERBB2  PRIOR THERAPY:  1) Concurrent chemoradiation with chemotherapy in the form of weekly carboplatin for an AUC of 2 and paclitaxel 45 mg/m given concurrent with radiation 2) Right VATS (video-assisted thoracoscopic surgery) with right lower lobectomy and mediastinal lymph node dissection under the care of [name] 07/23/2014. The final pathology showed residual 1.8 cm moderate to poorly differentiated invasive adenocarcinoma with no pleural or lymphovascular invasion. The dissected lymph nodes were negative for malignancy and the final pathologic stage was (pT1a, pN0, pMx).  CURRENT THERAPY: Observation.   INTERVAL HISTORY: [name] Lish89y.o. female returns to the clinic today for 6 months follow-up visit the patient is feeling fine today with no concerning complaints except for shortness of breath with exertion.  She is followed by Dr. STamala Julianfrom cardiology as well as Dr. BLamonte Sakaifrom pulmonary medicine.  She denied having any current chest pain, cough or hemoptysis.  She denied having any recent weight loss or night sweats.  She has no nausea, vomiting, diarrhea or constipation.  She has no headache or visual changes.  She is here today for evaluation with repeat CT scan of the chest for restaging of her disease.  MEDICAL HISTORY:  Past Medical History:  Diagnosis Date  . Anxiety    husband also cancer pt  . [place]  . Complication of anesthesia     bronchospasms post-op (pulmonary did consult but pt was discharged later that day)  With last surg. she was given albuterol inhaler & had steroid with surg.    . Congenital renal atrophy    left kidney, one spontaneous stone passed   . COPD ([place]ronic obstructive [place] disease) ([place]   . Coronary artery disease   . Degenerative arthritis    spine, hands & knees   . GERD (gastroesophageal reflux disease)   . History of migraine   . Hypertension    h/o PVC- asymptomatic   . Memory disorder 03/29/2017  . 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 PONV (postoperative nausea and vomiting)   . PVC (premature ventricular contraction)   . Shortness of breath dyspnea    with exertion  . Thyroid nodule    benign by biopsy  . Varicose veins    left leg  . Visual disturbance    Episodic  . Vitamin D deficiency   . Wears glasses     ALLERGIES:  is allergic to advil [ibuprofen], hydrocodone-guaifenesin, sulfa antibiotics, temazepam, plasticized base [plastibase], and zofran [ondansetron hcl].  MEDICATIONS:  Current Outpatient Medications  Medication Sig Dispense Refill  . albuterol (PROAIR HFA) 108 (90 Base) MCG/ACT inhaler Inhale 1 puff into the lungs 3 (three) times daily as needed for wheezing or shortness of breath. 6.7 g 5  . aspirin EC 81 MG tablet Take 1 tablet (81 mg total) by mouth daily. 90 tablet 3  . fluticasone (FLONASE) 50 MCG/ACT nasal spray Place 1 spray into both  nostrils in the morning and at bedtime.    [name] Kitchen loratadine (CLARITIN) 10 MG tablet Take 10 mg by mouth daily.    . nitroGLYCERIN (NITROSTAT) 0.4 MG SL tablet Place 1 tablet (0.4 mg total) under the tongue every 5 (five) minutes as needed for chest pain. 25 tablet 3  . rosuvastatin (CRESTOR) 20 MG tablet Take 1 tablet (20 mg total) by mouth daily. 90 tablet 3  . Tiotropium Bromide-Olodaterol (STIOLTO RESPIMAT) 2.5-2.5 MCG/ACT AERS Inhale 2 puffs  into the lungs daily. 4 g 2  . verapamil (CALAN-SR) 240 MG CR tablet Take 1 tablet (240 mg total) by mouth at bedtime. 90 tablet 0  . Vitamin D, Ergocalciferol, (DRISDOL) 50000 units CAPS capsule Take 50,000 Units by mouth every 7 (seven) days.     Current Facility-Administered Medications  Medication Dose Route Frequency Provider Last Rate Last Admin  . Ampicillin-Sulbactam (UNASYN) 3 g in sodium chloride 0.9 % 100 mL IVPB  3 g Intravenous Once Avelina Laine, PA-C        SURGICAL HISTORY:  Past Surgical History:  Procedure Laterality Date  . CARPAL TUNNEL RELEASE Left   . CESAREAN SECTION    . COLONOSCOPY W/ BIOPSIES AND POLYPECTOMY    . DILATION AND CURETTAGE OF UTERUS    . TONSILLECTOMY    . VARICOSE VEIN SURGERY     left  . VIDEO ASSISTED THORACOSCOPY (VATS)/ LOBECTOMY Right 07/23/2014   Procedure: VIDEO ASSISTED THORACOSCOPY (VATS)/ LOBECTOMY;  Surgeon: Ivin Poot, MD;  Location: [facility];  Service: Thoracic;  Laterality: Right;  [name] Kitchen VIDEO BRONCHOSCOPY N/A 07/23/2014   Procedure: VIDEO BRONCHOSCOPY;  Surgeon: Ivin Poot, MD;  Location: [facility];  Service: Thoracic;  Laterality: N/A;  . VIDEO BRONCHOSCOPY WITH ENDOBRONCHIAL ULTRASOUND Right 04/17/2014   Procedure: VIDEO BRONCHOSCOPY WITH ENDOBRONCHIAL ULTRASOUND;  Surgeon: Collene Gobble, MD;  Location: [facility];  Service: Thoracic;  Laterality: Right;  [name] Kitchen VIDEO BRONCHOSCOPY WITH ENDOBRONCHIAL ULTRASOUND N/A 07/08/2014   Procedure: VIDEO BRONCHOSCOPY WITH ENDOBRONCHIAL ULTRASOUND;  Surgeon: Ivin Poot, MD;  Location[facility];  Service: Thoracic;  Laterality: N/A;    REVIEW OF SYSTEMS:  A comprehensive review of systems was negative except for: Respiratory: positive for dyspnea on exertion   PHYSICAL EXAMINATION: General appearance: alert, cooperative and no distress Head: Normocephalic, without obvious abnormality, atraumatic Neck: no adenopathy, no JVD, supple, symmetrical, trachea midline and thyroid not enlarged, symmetric, no  tenderness/mass/nodules Lymph nodes: Cervical, supraclavicular, and axillary nodes normal. Resp: clear to auscultation bilaterally Back: symmetric, no curvature. ROM normal. No CVA tenderness. Cardio: regular rate and rhythm, S1, S2 normal, no murmur, click, rub or gallop GI: soft, non-tender; bowel sounds normal; no masses,  no organomegaly Extremities: extremities normal, atraumatic, no cyanosis or edema  ECOG PERFORMANCE STATUS: 0 - Asymptomatic  Blood pressure 136/80, pulse 74, temperature 97.9 F (36.6 C), temperature source Tympanic, resp. rate 20, height 5' 5"  (1.651 m), weight 175 lb 4.8 oz (79.5 kg), SpO2 97 %.  LABORATORY DATA: Lab Results  Component Value Date   WBC 6.8 02/05/2020   HGB 13.5 02/05/2020   HCT 41.8 02/05/2020   MCV 91.9 02/05/2020   PLT 254 02/05/2020      Chemistry      Component Value Date/Time   NA 140 02/05/2020 1044   NA 142 01/19/2017 0958   K 4.6 02/05/2020 1044   K 4.3 01/19/2017 0958   CL 108 02/05/2020 1044   CO2 27 02/05/2020 1044   CO2 25 01/19/2017 0958  BUN 12 02/05/2020 1044   BUN 15.2 01/19/2017 0958   CREATININE 0.80 02/05/2020 1044   CREATININE 0.8 01/19/2017 0958      Component Value Date/Time   CALCIUM 10.1 02/05/2020 1044   CALCIUM 9.5 01/19/2017 0958   ALKPHOS 109 02/05/2020 1044   ALKPHOS 121 01/19/2017 0958   AST 16 02/05/2020 1044   AST 19 01/19/2017 0958   ALT 11 02/05/2020 1044   ALT 20 01/19/2017 0958   BILITOT 0.9 02/05/2020 1044   BILITOT 0.64 01/19/2017 0958       RADIOGRAPHIC STUDIES: CT Chest W Contrast  Result Date: 02/05/2020 CLINICAL DATA:  Follow-up right lower lobe non-small cell carcinoma. Previous surgery, chemotherapy, and radiation therapy. EXAM: CT CHEST WITH CONTRAST TECHNIQUE: Multidetector CT imaging of the chest was performed during intravenous contrast administration. CONTRAST:  34m ISOVUE-370 IOPAMIDOL (ISOVUE-370) INJECTION 76% COMPARISON:  08/03/2019 FINDINGS: Cardiovascular: No  acute findings. Aortic and coronary artery atherosclerosis noted. Mediastinum/Nodes: Mild mediastinal lymphadenopathy in the right paratracheal region is again seen measuring 1.5 cm, without significant change. Previously noted left axillary lymph node is decreased in size, currently measuring 6 mm on image 24/2, compared to 1.2 cm previously. No new or increased sites of lymphadenopathy identified. Lungs/Pleura: No suspicious nodules or masses identified. No evidence of pulmonary infiltrate or pleural effusion. Mild centrilobular emphysema again noted. Stable postop changes from previous right lower lobectomy. Stable mild left lung scarring. Upper Abdomen:  Unremarkable. Musculoskeletal:  No suspicious bone lesions. IMPRESSION: Stable mild right paratracheal mediastinal lymphadenopathy. Decreased size of previously noted left axillary lymph node. No new or progressive disease within the thorax. Aortic Atherosclerosis (ICD10-I70.0) and Emphysema (ICD10-J43.9). Electronically Signed   By: JMarlaine HindM.D.   On: 02/05/2020 15:48    ASSESSMENT AND PLAN:  This is a very pleasant 64years old white female with a stage IIIA non-small cell lung cancer status post neoadjuvant concurrent chemoradiation with weekly carboplatin and paclitaxel followed by right lower lobectomy and lymph node dissection with the final pathology consistent with a pathological stage pT1a, pN0. The patient has been on observation since 2016 and she is doing fine except for the baseline shortness of breath secondary to cardiac and COPD etiology. She had repeat CT scan of the chest performed recently.  I personally and independently reviewed the scans and discussed the results with the patient today. Her scan showed no concerning findings for disease recurrence or metastasis. I recommended for her to continue on observation with repeat CT scan of the chest in 1 year. She was advised to call immediately if she has any concerning symptoms in  the interval. The patient voices understanding of current disease status and treatment options and is in agreement with the current care plan. All questions were answered. The patient knows to call the clinic with any problems, questions or concerns. We can certainly see the patient much sooner if necessary.  Disclaimer: This note was dictated with voice recognition software. Similar sounding words can inadvertently be transcribed and may not be corrected upon review.

## 2020-02-18 DIAGNOSIS — S8011XA Contusion of right lower leg, initial encounter: Secondary | ICD-10-CM | POA: Diagnosis not present

## 2020-02-20 DIAGNOSIS — Z23 Encounter for immunization: Secondary | ICD-10-CM | POA: Diagnosis not present

## 2020-04-04 ENCOUNTER — Other Ambulatory Visit: Payer: Medicare Other

## 2020-04-04 DIAGNOSIS — Z23 Encounter for immunization: Secondary | ICD-10-CM | POA: Diagnosis not present

## 2020-04-21 ENCOUNTER — Other Ambulatory Visit: Payer: Medicare Other

## 2020-04-21 ENCOUNTER — Other Ambulatory Visit: Payer: Self-pay

## 2020-04-21 DIAGNOSIS — E785 Hyperlipidemia, unspecified: Secondary | ICD-10-CM

## 2020-04-21 LAB — HEPATIC FUNCTION PANEL
ALT: 18 IU/L (ref 0–32)
AST: 17 IU/L (ref 0–40)
Albumin: 4.3 g/dL (ref 3.8–4.8)
Alkaline Phosphatase: 117 IU/L (ref 44–121)
Bilirubin Total: 0.6 mg/dL (ref 0.0–1.2)
Bilirubin, Direct: 0.21 mg/dL (ref 0.00–0.40)
Total Protein: 6.7 g/dL (ref 6.0–8.5)

## 2020-04-21 LAB — LIPID PANEL
Chol/HDL Ratio: 1.7 ratio (ref 0.0–4.4)
Cholesterol, Total: 144 mg/dL (ref 100–199)
HDL: 87 mg/dL (ref >39)
LDL Chol Calc (NIH): 44 mg/dL (ref 0–99)
Triglycerides: 60 mg/dL (ref 0–149)
VLDL Cholesterol Cal: 13 mg/dL (ref 5–40)

## 2020-05-14 ENCOUNTER — Ambulatory Visit: Payer: Medicare Other

## 2020-05-27 DIAGNOSIS — H52203 Unspecified astigmatism, bilateral: Secondary | ICD-10-CM | POA: Diagnosis not present

## 2020-05-27 DIAGNOSIS — Z961 Presence of intraocular lens: Secondary | ICD-10-CM | POA: Diagnosis not present

## 2020-06-08 NOTE — Progress Notes (Deleted)
Cardiology Office Note:    Date:  06/08/2020   ID:  [name] [name], DOB [birth year], MRN [id number]  PCP:  [name], MD  Cardiologist:  Sinclair Grooms, MD   Referring MD: [name], MD   No chief complaint on file.   History of Present Illness:    [name] [name] is a 65 y.o. female with a hx of ***  Past Medical History:  Diagnosis Date  . Anxiety    husband also cancer pt  . Cancer ([place])    lung  . Complication of anesthesia    bronchospasms post-op (pulmonary did consult but pt was discharged later that day)  With last surg. she was given albuterol inhaler & had steroid with surg.    . Congenital renal atrophy    left kidney, one spontaneous stone passed   . COPD (chronic obstructive pulmonary disease) ([place])   . Coronary artery disease   . Degenerative arthritis    spine, hands & knees   . GERD (gastroesophageal reflux disease)   . History of migraine   . Hypertension    h/o PVC- asymptomatic   . Memory disorder 03/29/2017  . 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 PONV (postoperative nausea and vomiting)   . PVC (premature ventricular contraction)   . Shortness of breath dyspnea    with exertion  . Thyroid nodule    benign by biopsy  . Varicose veins    left leg  . Visual disturbance    Episodic  . Vitamin D deficiency   . Wears glasses     Past Surgical History:  Procedure Laterality Date  . CARPAL TUNNEL RELEASE Left   . CESAREAN SECTION    . COLONOSCOPY W/ BIOPSIES AND POLYPECTOMY    . DILATION AND CURETTAGE OF UTERUS    . TONSILLECTOMY    . VARICOSE VEIN SURGERY     left  . VIDEO ASSISTED THORACOSCOPY (VATS)/ LOBECTOMY Right 07/23/2014   Procedure: VIDEO ASSISTED THORACOSCOPY (VATS)/ LOBECTOMY;  Surgeon: Ivin Poot, MD;  Location: [facility];  [name]: Thoracic;  Laterality: Right;  [name] Kitchen VIDEO BRONCHOSCOPY N/A 07/23/2014   Procedure: VIDEO  BRONCHOSCOPY;  Surgeon: Ivin Poot, MD;  Location: [facility];  [name]: Thoracic;  Laterality: N/A;  . VIDEO BRONCHOSCOPY WITH ENDOBRONCHIAL ULTRASOUND Right 04/17/2014   Procedure: VIDEO BRONCHOSCOPY WITH ENDOBRONCHIAL ULTRASOUND;  Surgeon: Collene Gobble, MD;  Location: [facility];  [name]: Thoracic;  Laterality: Right;  [name] Kitchen VIDEO BRONCHOSCOPY WITH ENDOBRONCHIAL ULTRASOUND N/A 07/08/2014   Procedure: VIDEO BRONCHOSCOPY WITH ENDOBRONCHIAL ULTRASOUND;  Surgeon: Ivin Poot, MD;  Location: [facility] OR;  [name]: Thoracic;  Laterality: N/A;    Current Medications: No outpatient medications have been marked as taking for the 06/12/20 encounter (Appointment) with Belva Crome, MD.   Current Facility-Administered Medications for the 06/12/20 encounter (Appointment) with Belva Crome, MD  Medication  . Ampicillin-Sulbactam (UNASYN) 3 g in sodium chloride 0.9 % 100 mL IVPB     Allergies:   Advil [ibuprofen], Hydrocodone-guaifenesin, Sulfa antibiotics, Temazepam, Plasticized base [plastibase], and Zofran [ondansetron hcl]   Social History   Socioeconomic History  . Marital status: Widowed    Spouse name: Not on file  . Number of children: 1  . Years of education: 24  . Highest education level: Not on file  Occupational History  . Occupation: Nurse    Employer: [employer]  Tobacco Use  .  Smoking status: Former Smoker    Packs/day: 0.50    Years: 40.00    Pack years: 20.00    Types: Cigarettes    Quit date: 03/17/2013    Years since quitting: 7.2  . Smokeless tobacco: Never Used  Vaping Use  . Vaping Use: Never used  Substance and Sexual Activity  . Alcohol use: Yes    Alcohol/week: 0.0 standard drinks    Comment: " rare"  . Drug use: No  . Sexual activity: Not on file  Other Topics Concern  . Not on file  Social History Narrative   Lives alone. Husband passed away 09/22/2016   Caffeine use: 1-2 cups coffee per day   Right handed    Social Determinants of Health   Financial  Resource Strain: Not on file  Food Insecurity: Not on file  Transportation Needs: Not on file  Physical Activity: Not on file  Stress: Not on file  Social Connections: Not on file     Family History: The patient's family history includes Atrial fibrillation in her father; Cancer in her mother; Diverticulitis in her brother and father; Headache in her sister; Hypertension in her father, sister, and sister.  ROS:   Please see the history of present illness.    *** All other systems reviewed and are negative.  EKGs/Labs/Other Studies Reviewed:    The following studies were reviewed today:  Coronary CTA 02/2019: IMPRESSION: 1. Coronary calcium score of 206. This was 68 percentile for age and sex matched control.  2. Normal coronary origin with right dominance.  3. Mild disease noted in the proximal LAD and moderate disease in proximal RCA. CAD-RADS 3.   FFR 02/2019:  1. Left Main:  No significant stenosis. FFR =.97  2. LAD: No significant stenosis. Proximal FFR =.95, Mid FFR =.90, Distal FFR =.90 3. LCX: No significant stenosis. Proximal FFR =.96, Distal FFR =.84; OM2 =.90 4. RCA: Significant stenosis. Proximal FFR =.99, Mid FFR =.76, Distal FFR =.74  IMPRESSION: 1. CT FFR analysis shows mid and distal RCA =.74 to .76. suggesting moderate stenosis in RCA noted on CTA is significant and consistent with ischemia.   EKG:  EKG ***  Recent Labs: 02/05/2020: BUN 12; Creatinine 0.80; Hemoglobin 13.5; Platelet Count 254; Potassium 4.6; Sodium 140 04/21/2020: ALT 18  Recent Lipid Panel    Component Value Date/Time   CHOL 144 04/21/2020 1232   TRIG 60 04/21/2020 1232   HDL 87 04/21/2020 1232   CHOLHDL 1.7 04/21/2020 1232   LDLCALC 44 04/21/2020 1232    Physical Exam:    VS:  There were no vitals taken for this visit.    Wt Readings from Last 3 Encounters:  02/12/20 175 lb 4.8 oz (79.5 kg)  11/26/19 176 lb (79.8 kg)  10/16/19 173 lb 6.4 oz (78.7 kg)      GEN: ***. No acute distress HEENT: Normal NECK: No JVD. LYMPHATICS: No lymphadenopathy CARDIAC: *** murmur. RRR *** gallop, or edema. VASCULAR: *** Normal Pulses. No bruits. RESPIRATORY:  Clear to auscultation without rales, wheezing or rhonchi  ABDOMEN: Soft, non-tender, non-distended, No pulsatile mass, MUSCULOSKELETAL: No deformity  SKIN: Warm and dry NEUROLOGIC:  Alert and oriented x 3 PSYCHIATRIC:  Normal affect   ASSESSMENT:    1. Coronary artery disease of native artery of native heart with stable angina pectoris ([place])   2. Hyperlipidemia with target LDL less than 70   3. Chronic obstructive pulmonary disease, unspecified COPD type ([place])   4. Hx of tobacco  use, presenting hazards to health   5. Primary cancer of right lower lobe of lung ([place])   6. Educated about COVID-19 virus infection    PLAN:    In order of problems listed above:  1. ***   Medication Adjustments/Labs and Tests Ordered: Current medicines are reviewed at length with the patient today.  Concerns regarding medicines are outlined above.  No orders of the defined types were placed in this encounter.  No orders of the defined types were placed in this encounter.   There are no Patient Instructions on file for this visit.   Signed, Sinclair Grooms, MD  06/08/2020 5:52 PM    [place]

## 2020-06-12 ENCOUNTER — Ambulatory Visit: Payer: Medicare Other | Admitting: Interventional Cardiology

## 2020-06-12 DIAGNOSIS — Z7189 Other specified counseling: Secondary | ICD-10-CM

## 2020-06-12 DIAGNOSIS — J449 Chronic obstructive pulmonary disease, unspecified: Secondary | ICD-10-CM

## 2020-06-12 DIAGNOSIS — Z87891 Personal history of nicotine dependence: Secondary | ICD-10-CM

## 2020-06-12 DIAGNOSIS — I25118 Atherosclerotic heart disease of native coronary artery with other forms of angina pectoris: Secondary | ICD-10-CM

## 2020-06-12 DIAGNOSIS — C3431 Malignant neoplasm of lower lobe, right bronchus or lung: Secondary | ICD-10-CM

## 2020-06-12 DIAGNOSIS — E785 Hyperlipidemia, unspecified: Secondary | ICD-10-CM

## 2020-08-14 DIAGNOSIS — S70322A Blister (nonthermal), left thigh, initial encounter: Secondary | ICD-10-CM | POA: Diagnosis not present

## 2020-09-30 NOTE — Progress Notes (Signed)
Cardiology Office Note:    Date:  10/01/2020   ID:  [name] [name], DOB 1955/08/19, MRN [id number]  PCP:  [name], MD  Cardiologist:  Sinclair Grooms, MD   Referring MD: [name], MD   Chief Complaint  Patient presents with  . Shortness of Breath  . Coronary Artery Disease    History of Present Illness:    [name] [name] is a 65 y.o. female with a hx of asymptomatic CAD denoted by coronary calcification with obstructive RCA disease by CorCTA, hyperlipidemia, hypertension, Lung CA and prior smoking history.  Slowly progressive shortness of breath. Exertion started to limit quality of life.  Shortness of breath is also worsened by bending or stooping.  Diet has not been a control relative to sodium.  Not getting substantial activity.  No cough or wheezing.  Bronchodilator therapy has not helped breathing.  Discontinued smoking about 8 years ago.  Did have chest discomfort occurring randomly several years ago.  This is infrequent now.  Has not really used nitroglycerin in a way that will be helpful relative to sorting out whether his symptoms are ischemic or not.  Past Medical History:  Diagnosis Date  . Anxiety    husband also cancer pt  . Cancer ([place])    lung  . Complication of anesthesia    bronchospasms post-op (pulmonary did consult but pt was discharged later that day)  With last surg. she was given albuterol inhaler & had steroid with surg.    . Congenital renal atrophy    left kidney, one spontaneous stone passed   . COPD (chronic obstructive pulmonary disease) ([place])   . Coronary artery disease   . Degenerative arthritis    spine, hands & knees   . GERD (gastroesophageal reflux disease)   . History of migraine   . Hypertension    h/o PVC- asymptomatic   . Memory disorder 03/29/2017  . 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 PONV (postoperative nausea and  vomiting)   . PVC (premature ventricular contraction)   . Shortness of breath dyspnea    with exertion  . Thyroid nodule    benign by biopsy  . Varicose veins    left leg  . Visual disturbance    Episodic  . Vitamin D deficiency   . Wears glasses     Past Surgical History:  Procedure Laterality Date  . CARPAL TUNNEL RELEASE Left   . CESAREAN SECTION    . COLONOSCOPY W/ BIOPSIES AND POLYPECTOMY    . DILATION AND CURETTAGE OF UTERUS    . TONSILLECTOMY    . VARICOSE VEIN SURGERY     left  . VIDEO ASSISTED THORACOSCOPY (VATS)/ LOBECTOMY Right 07/23/2014   Procedure: VIDEO ASSISTED THORACOSCOPY (VATS)/ LOBECTOMY;  Surgeon: Ivin Poot, MD;  Location: [facility];  [name]: Thoracic;  Laterality: Right;  [name] Kitchen VIDEO BRONCHOSCOPY N/A 07/23/2014   Procedure: VIDEO BRONCHOSCOPY;  Surgeon: Ivin Poot, MD;  Location: [facility];  [name]: Thoracic;  Laterality: N/A;  . VIDEO BRONCHOSCOPY WITH ENDOBRONCHIAL ULTRASOUND Right 04/17/2014   Procedure: VIDEO BRONCHOSCOPY WITH ENDOBRONCHIAL ULTRASOUND;  Surgeon: Collene Gobble, MD;  Location: [facility];  [name]: Thoracic;  Laterality: Right;  [name] Kitchen VIDEO BRONCHOSCOPY WITH ENDOBRONCHIAL ULTRASOUND N/A 07/08/2014   Procedure: VIDEO BRONCHOSCOPY WITH ENDOBRONCHIAL ULTRASOUND;  Surgeon: Ivin Poot, MD;  Location: [facility];  [name]: Thoracic;  Laterality: N/A;  Current Medications: Current Meds  Medication Sig  . albuterol (PROAIR HFA) 108 (90 Base) MCG/ACT inhaler Inhale 1 puff into the lungs 3 (three) times daily as needed for wheezing or shortness of breath.  [name] Kitchen aspirin EC 81 MG tablet Take 1 tablet (81 mg total) by mouth daily.  . rosuvastatin (CRESTOR) 20 MG tablet Take 1 tablet (20 mg total) by mouth daily.  . verapamil (CALAN-SR) 240 MG CR tablet Take 1 tablet (240 mg total) by mouth at bedtime.  . Vitamin D, Ergocalciferol, (DRISDOL) 50000 units CAPS capsule Take 50,000 Units by mouth every 7 (seven) days.   Current Facility-Administered Medications  for the 10/01/20 encounter (Office Visit) with Belva Crome, MD  Medication  . Ampicillin-Sulbactam (UNASYN) 3 g in sodium chloride 0.9 % 100 mL IVPB     Allergies:   Advil [ibuprofen], Hydrocodone-guaifenesin, Sulfa antibiotics, Temazepam, Plasticized base [plastibase], and Zofran [ondansetron hcl]   Social History   Socioeconomic History  . Marital status: Widowed    Spouse name: Not on file  . Number of children: 1  . Years of education: 39  . Highest education level: Not on file  Occupational History  . Occupation: Nurse    Employer: [employer]  Tobacco Use  . Smoking status: Former Smoker    Packs/day: 0.50    Years: 40.00    Pack years: 20.00    Types: Cigarettes    Quit date: 03/17/2013    Years since quitting: 7.5  . Smokeless tobacco: Never Used  Vaping Use  . Vaping Use: Never used  Substance and Sexual Activity  . Alcohol use: Yes    Alcohol/week: 0.0 standard drinks    Comment: " rare"  . Drug use: No  . Sexual activity: Not on file  Other Topics Concern  . Not on file  Social History Narrative   Lives alone. Husband passed away 2016/09/24   Caffeine use: 1-2 cups coffee per day   Right handed    Social Determinants of Health   Financial Resource Strain: Not on file  Food Insecurity: Not on file  Transportation Needs: Not on file  Physical Activity: Not on file  Stress: Not on file  Social Connections: Not on file     Family History: The patient's family history includes Atrial fibrillation in her father; Cancer in her mother; Diverticulitis in her brother and father; Headache in her sister; Hypertension in her father, sister, and sister.  ROS:   Please see the history of present illness.    Physical deconditioning.  Not exercising.  Gaining weight.  Ate pretzels today prior to coming to the office.  All other systems reviewed and are negative.  EKGs/Labs/Other Studies Reviewed:    The following studies were reviewed today: No recent  cardiac imaging  Coronary CTA September 2020:  IMPRESSION: 1. Coronary calcium score of 206. This was 39 percentile for age and sex matched control.  2. Normal coronary origin with right dominance.  3. Mild disease noted in the proximal LAD and moderate disease in proximal RCA. CAD-RADS 3.  4. Study will be sent for FFR.   EKG:  EKG normal sinus rhythm, nonspecific T wave flattening, and since 2020, the T wave flattening is new.  Recent Labs: 02/05/2020: BUN 12; Creatinine 0.80; Hemoglobin 13.5; Platelet Count 254; Potassium 4.6; Sodium 140 04/21/2020: ALT 18  Recent Lipid Panel    Component Value Date/Time   CHOL 144 04/21/2020 1232   TRIG 60 04/21/2020 1232   HDL 87  04/21/2020 1232   CHOLHDL 1.7 04/21/2020 1232   LDLCALC 44 04/21/2020 1232    Physical Exam:    VS:  BP 122/90   Pulse 81   Ht 5' (1.524 m)   Wt 175 lb (79.4 kg)   SpO2 98%   BMI 34.18 kg/m     Wt Readings from Last 3 Encounters:  10/01/20 175 lb (79.4 kg)  02/12/20 175 lb 4.8 oz (79.5 kg)  11/26/19 176 lb (79.8 kg)     GEN: Gaining weight.  BMI 34.. No acute distress HEENT: Normal NECK: No JVD. LYMPHATICS: No lymphadenopathy CARDIAC: No murmur. RRR no gallop, or edema. VASCULAR:  Normal Pulses. No bruits. RESPIRATORY:  Clear to auscultation without rales, wheezing or rhonchi  ABDOMEN: Soft, non-tender, non-distended, No pulsatile mass, MUSCULOSKELETAL: No deformity  SKIN: Warm and dry NEUROLOGIC:  Alert and oriented x 3 PSYCHIATRIC:  Normal affect   ASSESSMENT:    1. Coronary artery disease of native artery of native heart with stable angina pectoris ([place])   2. Hyperlipidemia with target LDL less than 70   3. Chronic obstructive pulmonary disease, unspecified COPD type ([place])   4. Hx of tobacco use, presenting hazards to health   5. Frequent PVCs   6. SOB (shortness of breath)   7. Primary hypertension    PLAN:    In order of problems listed above:  1. Atherosclerosis, being  managed with statin therapy and aspirin.  We discussed primary risk reduction to include 150 minutes more moderate activity per week, glycemic control, blood pressure control less than 130/80 mmHg, good sleep habits, consideration of sleep apnea as a reason for poor sleep quality, and tight lipid control.  Sublingual nitroglycerin if chest discomfort persists greater than 5 minutes. 2. Continue Crestor 20 mg/day.  LDL cholesterol was 44 in November 2021. 3. Not smoking.  On bronchodilator therapy that did not seem to make any difference in her dyspnea. 4. No longer smokes 5. Noted on exam today. 6. 2D Doppler echocardiogram will be done to rule out progression of diastolic dysfunction and relook at systolic function which was low normal 7 years ago. 7. Target blood pressure is 130/80 mmHg.  I offered low-dose diuretic therapy today's blood pressure is elevated but she declined.  Instead, will monitor blood pressure at home keeping in mind the target is 130/80.  She will decrease salt to less than 2.5 g/day.  She will try to increase physical activity.  If blood pressures are uncontrolled, we will add low-dose diuretic therapy to help blood pressure and also see if that improves dyspnea on exertion.   Medication Adjustments/Labs and Tests Ordered: Current medicines are reviewed at length with the patient today.  Concerns regarding medicines are outlined above.  Orders Placed This Encounter  Procedures  . EKG 12-Lead  . ECHOCARDIOGRAM COMPLETE   No orders of the defined types were placed in this encounter.   Patient Instructions  Medication Instructions:  Your physician recommends that you continue on your current medications as directed. Please refer to the Current Medication list given to you today. *If you need a refill on your cardiac medications before your next appointment, please call your pharmacy*   Lab Work: None If you have labs (blood work) drawn today and your tests are  completely normal, you will receive your results only by: [name] Kitchen MyChart Message (if you have MyChart) OR . A paper copy in the mail If you have any lab test that is abnormal or we need  to change your treatment, we will call you to review the results.   Testing/Procedures:  Your physician has requested that you have an echocardiogram. Echocardiography is a painless test that uses sound waves to create images of your heart. It provides your doctor with information about the size and shape of your heart and how well your heart's chambers and valves are working. This procedure takes approximately one hour. There are no restrictions for this procedure.     Follow-Up: At [facility], you and your health needs are our priority.  As part of our continuing mission to provide you with exceptional heart care, we have created designated Provider Care Teams.  These Care Teams include your primary Cardiologist (physician) and Advanced Practice Providers (APPs -  Physician Assistants and Nurse Practitioners) who all work together to provide you with the care you need, when you need it.  We recommend signing up for the patient portal called "MyChart".  Sign up information is provided on this After Visit Summary.  MyChart is used to connect with patients for Virtual Visits (Telemedicine).  Patients are able to view lab/test results, encounter notes, upcoming appointments, etc.  Non-urgent messages can be sent to your provider as well.   To learn more about what you can do with MyChart, go to [email or web address].    Your next appointment:   6 month(s)  The format for your next appointment:   In Person  Provider:   You may see Sinclair Grooms, MD or one of the following Advanced Practice Providers on your designated Care Team:    Kathyrn Drown, NP    Other Instructions  Your provider recommends that you maintain 150 minutes per week of moderate aerobic activity.  Monitor your blood pressure and  let us know if it is consistently 140/90 or higher.     Signed, Sinclair Grooms, MD  10/01/2020 5:28 PM    [place]

## 2020-10-01 ENCOUNTER — Ambulatory Visit (INDEPENDENT_AMBULATORY_CARE_PROVIDER_SITE_OTHER): Payer: Medicare Other | Admitting: Interventional Cardiology

## 2020-10-01 ENCOUNTER — Other Ambulatory Visit: Payer: Self-pay

## 2020-10-01 ENCOUNTER — Encounter: Payer: Self-pay | Admitting: Interventional Cardiology

## 2020-10-01 VITALS — BP 122/90 | HR 81 | Ht 60.0 in | Wt 175.0 lb

## 2020-10-01 DIAGNOSIS — R0602 Shortness of breath: Secondary | ICD-10-CM

## 2020-10-01 DIAGNOSIS — Z87891 Personal history of nicotine dependence: Secondary | ICD-10-CM

## 2020-10-01 DIAGNOSIS — J449 Chronic obstructive pulmonary disease, unspecified: Secondary | ICD-10-CM

## 2020-10-01 DIAGNOSIS — E785 Hyperlipidemia, unspecified: Secondary | ICD-10-CM

## 2020-10-01 DIAGNOSIS — I25118 Atherosclerotic heart disease of native coronary artery with other forms of angina pectoris: Secondary | ICD-10-CM

## 2020-10-01 DIAGNOSIS — I493 Ventricular premature depolarization: Secondary | ICD-10-CM | POA: Diagnosis not present

## 2020-10-01 DIAGNOSIS — I10 Essential (primary) hypertension: Secondary | ICD-10-CM | POA: Diagnosis not present

## 2020-10-01 NOTE — Patient Instructions (Addendum)
Medication Instructions:  Your physician recommends that you continue on your current medications as directed. Please refer to the Current Medication list given to you today. *If you need a refill on your cardiac medications before your next appointment, please call your pharmacy*   Lab Work: None If you have labs (blood work) drawn today and your tests are completely normal, you will receive your results only by: [name] Kitchen MyChart Message (if you have MyChart) OR . A paper copy in the mail If you have any lab test that is abnormal or we need to change your treatment, we will call you to review the results.   Testing/Procedures:  Your physician has requested that you have an echocardiogram. Echocardiography is a painless test that uses sound waves to create images of your heart. It provides your doctor with information about the size and shape of your heart and how well your heart's chambers and valves are working. This procedure takes approximately one hour. There are no restrictions for this procedure.     Follow-Up: At [facility], you and your health needs are our priority.  As part of our continuing mission to provide you with exceptional heart care, we have created designated Provider Care Teams.  These Care Teams include your primary Cardiologist (physician) and Advanced Practice Providers (APPs -  Physician Assistants and Nurse Practitioners) who all work together to provide you with the care you need, when you need it.  We recommend signing up for the patient portal called "MyChart".  Sign up information is provided on this After Visit Summary.  MyChart is used to connect with patients for Virtual Visits (Telemedicine).  Patients are able to view lab/test results, encounter notes, upcoming appointments, etc.  Non-urgent messages can be sent to your provider as well.   To learn more about what you can do with MyChart, go to [email or web address].    Your next appointment:   6  month(s)  The format for your next appointment:   In Person  Provider:   You may see Sinclair Grooms, MD or one of the following Advanced Practice Providers on your designated Care Team:    Kathyrn Drown, NP    Other Instructions  Your provider recommends that you maintain 150 minutes per week of moderate aerobic activity.  Monitor your blood pressure and let us know if it is consistently 140/90 or higher.

## 2020-10-10 DIAGNOSIS — Q5128 Other doubling of uterus, other specified: Secondary | ICD-10-CM

## 2020-10-10 DIAGNOSIS — Q8789 Other specified congenital malformation syndromes, not elsewhere classified: Secondary | ICD-10-CM | POA: Insufficient documentation

## 2020-10-10 HISTORY — DX: Other specified congenital malformation syndromes, not elsewhere classified: Q87.89

## 2020-10-15 DIAGNOSIS — Z6829 Body mass index (BMI) 29.0-29.9, adult: Secondary | ICD-10-CM | POA: Diagnosis not present

## 2020-10-15 DIAGNOSIS — Z800 Family history of malignant neoplasm of digestive organs: Secondary | ICD-10-CM | POA: Diagnosis not present

## 2020-10-15 DIAGNOSIS — Z86010 Personal history of colonic polyps: Secondary | ICD-10-CM | POA: Diagnosis not present

## 2020-10-15 DIAGNOSIS — Z01411 Encounter for gynecological examination (general) (routine) with abnormal findings: Secondary | ICD-10-CM | POA: Diagnosis not present

## 2020-10-15 DIAGNOSIS — Z8049 Family history of malignant neoplasm of other genital organs: Secondary | ICD-10-CM | POA: Diagnosis not present

## 2020-10-15 DIAGNOSIS — Z124 Encounter for screening for malignant neoplasm of cervix: Secondary | ICD-10-CM | POA: Diagnosis not present

## 2020-10-15 DIAGNOSIS — Z803 Family history of malignant neoplasm of breast: Secondary | ICD-10-CM | POA: Diagnosis not present

## 2020-10-15 DIAGNOSIS — Z01419 Encounter for gynecological examination (general) (routine) without abnormal findings: Secondary | ICD-10-CM | POA: Diagnosis not present

## 2020-10-21 ENCOUNTER — Ambulatory Visit: Payer: Medicare Other | Admitting: Emergency Medicine

## 2020-10-28 ENCOUNTER — Ambulatory Visit (HOSPITAL_COMMUNITY): Payer: Medicare Other

## 2020-10-30 ENCOUNTER — Other Ambulatory Visit: Payer: Self-pay

## 2020-10-30 ENCOUNTER — Ambulatory Visit (HOSPITAL_COMMUNITY): Payer: Medicare Other | Attending: Cardiology

## 2020-10-30 DIAGNOSIS — R0602 Shortness of breath: Secondary | ICD-10-CM | POA: Diagnosis not present

## 2020-10-30 LAB — ECHOCARDIOGRAM COMPLETE
Area-P 1/2: 2.8 cm2
MV M vel: 5.08 m/s
MV Peak grad: 103.2 mmHg
S' Lateral: 3.95 cm

## 2020-10-31 ENCOUNTER — Telehealth: Payer: Self-pay | Admitting: Interventional Cardiology

## 2020-10-31 DIAGNOSIS — I10 Essential (primary) hypertension: Secondary | ICD-10-CM

## 2020-10-31 NOTE — Telephone Encounter (Signed)
Belva Crome, MD  10/30/2020 8:02 PM EDT      Let the patient know the echo suggests mild reduction in EF 45-50%. Normal for a female is > 55%. Stop verapamil and start Losartan 50 mg daily. BMET and BNP in 7-10 days. Could be chemo related although unlikely. Verapamil is negative inotropic and does not help if LV is not energetic. A copy will be sent to [name] Hatchet, MD    Patient has questions before she wants to make this change.   Wanted to make sure it was known that the verapamil was first prescribed for atypical migraines with partial vision loss. Then verapamil dose was increased later when she was found to have HTN. Does this change anything or/and will Losartan cover her atypical migraines with partial vision loss as well. She wants to know if radiation treatment to her lungs could have caused this? If Losartan will have any side effects to her single kidney?

## 2020-10-31 NOTE — Telephone Encounter (Signed)
Patient was returning call for results 

## 2020-11-04 NOTE — Telephone Encounter (Signed)
Left message to call back  

## 2020-11-05 MED ORDER — LOSARTAN POTASSIUM 50 MG PO TABS: 50.0000 mg | ORAL_TABLET | Freq: Every day | ORAL | 3 refills | Status: DC

## 2020-11-05 NOTE — Telephone Encounter (Signed)
Spoke with [name] [name] and made her aware of information from Dr. Tamala Julian.  [name] [name] agreeable to med change.  She will come for labs on 11/13/20.  Advised to call sooner if any issues with the medication.  [name] [name] agreeable to plan.

## 2020-11-05 NOTE — Telephone Encounter (Signed)
Losartan will not treat migraines. Losartan will help prevent further decline in LV function.  If migraines recur, Verapamil or other agent can be resumed in conjunction. Need to see impact on BP. Did not want to add Losartan initially.  If Losartan is started, blood work is done to follow for hyperkalemia and renal function.  No way to identify impact of radiation on LV function.  ----- Message -----  From: Darrell Jewel, RN  Sent: 10/31/2020 12:15 PM EDT  To: Belva Crome, MD, Loren Racer, RN

## 2020-11-11 ENCOUNTER — Telehealth: Payer: Self-pay | Admitting: Interventional Cardiology

## 2020-11-11 ENCOUNTER — Other Ambulatory Visit: Payer: Self-pay

## 2020-11-11 ENCOUNTER — Encounter: Payer: Self-pay | Admitting: Emergency Medicine

## 2020-11-11 ENCOUNTER — Ambulatory Visit (INDEPENDENT_AMBULATORY_CARE_PROVIDER_SITE_OTHER): Payer: Medicare Other | Admitting: Emergency Medicine

## 2020-11-11 VITALS — BP 126/82 | HR 88 | Temp 97.9°F | Ht 60.0 in | Wt 176.0 lb

## 2020-11-11 DIAGNOSIS — J449 Chronic obstructive pulmonary disease, unspecified: Secondary | ICD-10-CM | POA: Diagnosis not present

## 2020-11-11 DIAGNOSIS — I25118 Atherosclerotic heart disease of native coronary artery with other forms of angina pectoris: Secondary | ICD-10-CM | POA: Diagnosis not present

## 2020-11-11 DIAGNOSIS — C3431 Malignant neoplasm of lower lobe, right bronchus or lung: Secondary | ICD-10-CM | POA: Diagnosis not present

## 2020-11-11 DIAGNOSIS — E785 Hyperlipidemia, unspecified: Secondary | ICD-10-CM

## 2020-11-11 NOTE — Patient Instructions (Signed)
We will hold off on starting any scheduled inhaler medication for now. Keep your albuterol (Ventolin) available to use 2 puffs if needed for shortness of breath, chest tightness, wheezing. I think you would benefit from going back for pulmonary rehab to build your stamina and exercise tolerance.  We will make the referral today. Continue to follow with Dr. Tamala Julian as planned Follow with Dr Lamonte Sakai in 6 months or sooner if you have any problems

## 2020-11-11 NOTE — Telephone Encounter (Signed)
Spoke with the pt and will forward to Dr. Tamala Julian to see if we can add Lipids to her previously planned labs this Friday 11/14/20.   Her PCP has been refilling her Crestor since 02/2020 and she did not realize they were not giving her the increased dose that we had her on (20 mg).    What is your medication issue? Patient got this medication refilled by her PCP starting in 9/21 and the dosage information got messed up Patient wanted to know if she needs to go back up to 20 mg daily and or have repeat labs drawn   Med list changed to reflect her current 10 mg dose.

## 2020-11-11 NOTE — Telephone Encounter (Signed)
Pt c/o medication issue:  1. Name of Medication: rosuvastatin (CRESTOR) 20 MG tablet  2. How are you currently taking this medication (dosage and times per day)? Patient is only taking 10 mg daily  3. Are you having a reaction (difficulty breathing--STAT)?   4. What is your medication issue? Patient got this medication refilled by her PCP starting in 9/21 and the dosage information got messed up Patient wanted to know if she needs to go back up to 20 mg daily and or have repeat labs drawn  Please advise

## 2020-11-11 NOTE — Assessment & Plan Note (Signed)
Overall her breathing remained about the same after the Stiolto was discontinued.  She also does not seem to get much benefit from albuterol.  I am surprised that this is the case that she does have moderate obstruction by PFT.  Possible that most of her dyspnea relates to overall deconditioning.  We will refer for pulmonary rehab.  She will keep albuterol available use if needed.  We will hold off on any scheduled BD therapy for now.  We will hold off on starting any scheduled inhaler medication for now. Keep your albuterol (Ventolin) available to use 2 puffs if needed for shortness of breath, chest tightness, wheezing. I think you would benefit from going back for pulmonary rehab to build your stamina and exercise tolerance.  We will make the referral today. Follow with Dr Lamonte Sakai in 6 months or sooner if you have any problems

## 2020-11-11 NOTE — Assessment & Plan Note (Signed)
Follows with Dr. Julien Nordmann.  Most recent CT chest reviewed

## 2020-11-11 NOTE — Progress Notes (Signed)
Subjective:    Patient ID: [name] [name], female    DOB: 04-06-1956, 65 y.o.   MRN: [id number]  COPD She complains of cough and shortness of breath. There is no wheezing. Pertinent negatives include no ear pain, fever, headaches, postnasal drip, rhinorrhea, sneezing, sore throat or trouble swallowing. Her past medical history is significant for COPD.    ROV 11/26/2019 --follow-up visit for 65 year old former smoker with COPD, history of right lower lobectomy for adenocarcinoma with neoadjuvant chemotherapy, CAD.  I tried her on Spiriva last year but she continued to have dyspnea, more recently she was changed to [facility] in April.  She reports that the Stiolto has helped her breathing some, less trouble with SOB during hot / humid air. She has been quite sedentary for about a year so it is tough to gauge her exertional SOB. She is not wheezing, coughing, having any chest tightness. Rare albuterol use.   Her cardiology evaluations have been overall reassuring, saw Dr Tamala Julian in May. Her daughter has moved back home - now has 5 cats.   R OV 11/11/2020 --65 year old woman with COPD and a right lower lobectomy for adenocarcinoma treated with neoadjuvant chemo.  Also with a history of CAD.  We were managing her on Stiolto, but stopped it. She didn't seem to miss it.  Today she reports that her breathing has been fairly stable. She does still get exertional SOB with heavier activity. Most recently has not seen any benefit from her albuterol. Denies any wheezing or cough. She hasn't gone back to pulm rehab yet, isn't sure she wants to do so with a mask on - she is considering.   TTE 10/30/20 with EF 45-50% >> her verapamil was changed to losartan.   Most recent CT chest 02/05/2020 reviewed by me, shows decreased size of left axillary lymph node, stable mild right paratracheal mediastinal adenopathy, no suspicious nodules or masses.   Review of Systems  Constitutional: Negative for fever and unexpected  weight change.  HENT: Negative for congestion, dental problem, ear pain, nosebleeds, postnasal drip, rhinorrhea, sinus pressure, sneezing, sore throat and trouble swallowing.   Eyes: Negative for redness and itching.  Respiratory: Positive for cough and shortness of breath. Negative for chest tightness and wheezing.   Cardiovascular: Negative for palpitations and leg swelling.  Gastrointestinal: Negative for nausea and vomiting.  Genitourinary: Negative for dysuria.  Musculoskeletal: Negative for joint swelling.  Skin: Negative for rash.  Neurological: Negative for headaches.  Hematological: Does not bruise/bleed easily.  Psychiatric/Behavioral: Negative for dysphoric mood. The patient is not nervous/anxious.        Objective:   Physical Exam Vitals:   11/11/20 1209  BP: 126/82  Pulse: 88  Temp: 97.9 F (36.6 C)  TempSrc: Temporal  SpO2: 98%  Weight: 176 lb (79.8 kg)  Height: 5' (1.524 m)   Gen: Pleasant, well-nourished, in no distress,  normal affect  ENT: No lesions,  mouth clear,  oropharynx clear, no postnasal drip, no stridor  Lungs: No use of accessory muscles, no crackles, no active wheezing  Cardiovascular: RRR, heart sounds normal, no murmur or gallops, no peripheral edema  Musculoskeletal: No deformities, no cyanosis or clubbing  Neuro: alert, non focal  Skin: Warm, no lesions or rashes       Assessment & Plan:  COPD GOLD II B  Overall her breathing remained about the same after the Stiolto was discontinued.  She also does not seem to get much benefit from albuterol.  I am surprised  that this is the case that she does have moderate obstruction by PFT.  Possible that most of her dyspnea relates to overall deconditioning.  We will refer for pulmonary rehab.  She will keep albuterol available use if needed.  We will hold off on any scheduled BD therapy for now.  We will hold off on starting any scheduled inhaler medication for now. Keep your albuterol  (Ventolin) available to use 2 puffs if needed for shortness of breath, chest tightness, wheezing. I think you would benefit from going back for pulmonary rehab to build your stamina and exercise tolerance.  We will make the referral today. Follow with Dr Lamonte Sakai in 6 months or sooner if you have any problems  Primary cancer of right lower lobe of lung Follows with Dr. Julien Nordmann.  Most recent CT chest reviewed  Baltazar Apo, MD, PhD 11/11/2020, 5:31 PM [facility] [phone] or if no answer [phone]

## 2020-11-12 ENCOUNTER — Encounter (HOSPITAL_COMMUNITY): Payer: Self-pay | Admitting: *Deleted

## 2020-11-12 NOTE — Telephone Encounter (Signed)
Left detailed message letting pt know that I will add on Lipid and Liver to labs for this Friday and to come fasting.  Advised to continue current dose of Rosuvastatin and we would review labs and go from there.

## 2020-11-12 NOTE — Progress Notes (Signed)
Received referral from Dr. Elsworth Soho  for this pt to participate in pulmonary rehab with the the diagnosis of COPD Stage 2. Pt had PFT on 11/26/2019 FEV1/FVC 63 and FEV! Post pred. 60.   Pt is known to pulmonary rehab staff with her previous participation in 2020.  At that time pt was working and had [place] for insurance.  Pt now has Medicare A/B. Clinical review of pt follow up appt on 6/8 Pulmonary office note.  Pt with Covid Risk Score - 5. Pt appropriate for scheduling for Pulmonary rehab.  Will forward to support staff for scheduling when able as there is wait list and verification of insurance eligibility/benefits with pt consent. Cherre Huger, BSN Cardiac and Training and development officer

## 2020-11-13 ENCOUNTER — Other Ambulatory Visit: Payer: Medicare Other

## 2020-11-14 ENCOUNTER — Other Ambulatory Visit: Payer: Self-pay

## 2020-11-14 ENCOUNTER — Other Ambulatory Visit: Payer: Medicare Other | Admitting: *Deleted

## 2020-11-14 DIAGNOSIS — E785 Hyperlipidemia, unspecified: Secondary | ICD-10-CM

## 2020-11-14 DIAGNOSIS — I10 Essential (primary) hypertension: Secondary | ICD-10-CM | POA: Diagnosis not present

## 2020-11-14 LAB — BASIC METABOLIC PANEL
BUN/Creatinine Ratio: 15 (ref 12–28)
BUN: 11 mg/dL (ref 8–27)
CO2: 23 mmol/L (ref 20–29)
Calcium: 9.4 mg/dL (ref 8.7–10.3)
Chloride: 104 mmol/L (ref 96–106)
Creatinine, Ser: 0.71 mg/dL (ref 0.57–1.00)
Glucose: 93 mg/dL (ref 65–99)
Potassium: 4.3 mmol/L (ref 3.5–5.2)
Sodium: 141 mmol/L (ref 134–144)
eGFR: 93 mL/min/{1.73_m2} (ref >59)

## 2020-11-14 LAB — HEPATIC FUNCTION PANEL
ALT: 12 IU/L (ref 0–32)
AST: 13 IU/L (ref 0–40)
Albumin: 4.1 g/dL (ref 3.8–4.8)
Alkaline Phosphatase: 112 IU/L (ref 44–121)
Bilirubin Total: 0.5 mg/dL (ref 0.0–1.2)
Bilirubin, Direct: 0.16 mg/dL (ref 0.00–0.40)
Total Protein: 6.6 g/dL (ref 6.0–8.5)

## 2020-11-14 LAB — LIPID PANEL
Chol/HDL Ratio: 1.9 ratio (ref 0.0–4.4)
Cholesterol, Total: 127 mg/dL (ref 100–199)
HDL: 68 mg/dL (ref >39)
LDL Chol Calc (NIH): 48 mg/dL (ref 0–99)
Triglycerides: 46 mg/dL (ref 0–149)
VLDL Cholesterol Cal: 11 mg/dL (ref 5–40)

## 2020-11-24 DIAGNOSIS — Z0000 Encounter for general adult medical examination without abnormal findings: Secondary | ICD-10-CM | POA: Diagnosis not present

## 2020-11-24 DIAGNOSIS — Z79899 Other long term (current) drug therapy: Secondary | ICD-10-CM | POA: Diagnosis not present

## 2020-11-24 DIAGNOSIS — E538 Deficiency of other specified B group vitamins: Secondary | ICD-10-CM | POA: Diagnosis not present

## 2020-11-24 DIAGNOSIS — E785 Hyperlipidemia, unspecified: Secondary | ICD-10-CM | POA: Diagnosis not present

## 2020-11-24 DIAGNOSIS — E559 Vitamin D deficiency, unspecified: Secondary | ICD-10-CM | POA: Diagnosis not present

## 2020-11-25 DIAGNOSIS — Z1331 Encounter for screening for depression: Secondary | ICD-10-CM | POA: Diagnosis not present

## 2020-11-25 DIAGNOSIS — G43809 Other migraine, not intractable, without status migrainosus: Secondary | ICD-10-CM | POA: Diagnosis not present

## 2020-11-25 DIAGNOSIS — C3490 Malignant neoplasm of unspecified part of unspecified bronchus or lung: Secondary | ICD-10-CM | POA: Diagnosis not present

## 2020-11-25 DIAGNOSIS — I10 Essential (primary) hypertension: Secondary | ICD-10-CM | POA: Diagnosis not present

## 2020-11-25 DIAGNOSIS — E559 Vitamin D deficiency, unspecified: Secondary | ICD-10-CM | POA: Diagnosis not present

## 2020-11-25 DIAGNOSIS — R82998 Other abnormal findings in urine: Secondary | ICD-10-CM | POA: Diagnosis not present

## 2020-11-25 DIAGNOSIS — M25562 Pain in left knee: Secondary | ICD-10-CM | POA: Diagnosis not present

## 2020-11-25 DIAGNOSIS — R413 Other amnesia: Secondary | ICD-10-CM | POA: Diagnosis not present

## 2020-11-25 DIAGNOSIS — Z0000 Encounter for general adult medical examination without abnormal findings: Secondary | ICD-10-CM | POA: Diagnosis not present

## 2020-11-25 DIAGNOSIS — G4700 Insomnia, unspecified: Secondary | ICD-10-CM | POA: Diagnosis not present

## 2020-11-25 DIAGNOSIS — E538 Deficiency of other specified B group vitamins: Secondary | ICD-10-CM | POA: Diagnosis not present

## 2020-11-25 DIAGNOSIS — E785 Hyperlipidemia, unspecified: Secondary | ICD-10-CM | POA: Diagnosis not present

## 2020-11-25 DIAGNOSIS — I5020 Unspecified systolic (congestive) heart failure: Secondary | ICD-10-CM | POA: Diagnosis not present

## 2020-11-27 DIAGNOSIS — Z1212 Encounter for screening for malignant neoplasm of rectum: Secondary | ICD-10-CM | POA: Diagnosis not present

## 2020-12-02 ENCOUNTER — Encounter (HOSPITAL_COMMUNITY): Payer: Self-pay

## 2020-12-02 ENCOUNTER — Telehealth (HOSPITAL_COMMUNITY): Payer: Self-pay

## 2020-12-02 NOTE — Telephone Encounter (Signed)
Pt insurance is active and benefits verified through Medicare A/B. Co-pay $0.00, DED $233.00/$233.00 met, out of pocket $0.00/$0.00 met, co-insurance 20%. No pre-authorization required. 12/02/20   Will contact patient to see if she is interested in the Pulmonary Rehab Program.

## 2020-12-02 NOTE — Telephone Encounter (Signed)
Attempted to call patient in regards to Pulmonary Rehab - LM on VM Mailed letter 

## 2020-12-03 ENCOUNTER — Telehealth: Payer: Self-pay | Admitting: Interventional Cardiology

## 2020-12-03 ENCOUNTER — Telehealth (HOSPITAL_COMMUNITY): Payer: Self-pay

## 2020-12-03 MED ORDER — NITROGLYCERIN 0.4 MG SL SUBL: 0.4000 mg | SUBLINGUAL_TABLET | SUBLINGUAL | 6 refills | Status: DC | PRN

## 2020-12-03 NOTE — Telephone Encounter (Signed)
*  STAT* If patient is at the pharmacy, call can be transferred to refill team.   1. Which medications need to be refilled? (please list name of each medication and dose if known) need a new prescription for  Nitrostat 0.4 mg  2. Which pharmacy/location (including street and city if local pharmacy) is medication to be sent to? [facility] [place] , [place]  3. Do they need a 30 day or 90 day supply? #25  and refills

## 2020-12-03 NOTE — Telephone Encounter (Signed)
Pt returned PR phone call and stated she is interested. Explained scheduling process and went over insurance, patient verbalized understanding. Also adv pt where we are with scheduling for PR and that we have a back log. (1-3 months)

## 2020-12-03 NOTE — Telephone Encounter (Signed)
Pt's medication was sent to pt's pharmacy as requested. Confirmation received.  °

## 2020-12-12 DIAGNOSIS — Z1231 Encounter for screening mammogram for malignant neoplasm of breast: Secondary | ICD-10-CM | POA: Diagnosis not present

## 2020-12-22 ENCOUNTER — Telehealth (HOSPITAL_COMMUNITY): Payer: Self-pay

## 2020-12-22 NOTE — Telephone Encounter (Signed)
**  UPDATE**   Pt insurance is active and benefits verified through Medicare A/B. Co-pay $0.00, DED $233.00/$233.00 met, out of pocket $0.00/$0.00 met, co-insurance 20%. No pre-authorization required. 12/22/20 @ 10:40AM   2ndary insurance is active and benefits verified through [place]. Co-pay $0.00, DED $0.00/$0.00 met, out of pocket $0.00/$0.00 met, co-insurance 0%. No pre-authorization required. 12/22/20 @ 10:40AM

## 2021-01-16 ENCOUNTER — Telehealth (HOSPITAL_COMMUNITY): Payer: Self-pay

## 2021-01-16 NOTE — Telephone Encounter (Signed)
Called patient to see if she was interested in participating in the Pulmonary Rehab Program. Patient stated yes. Patient will come in for orientation on 02/16/21 @ 11AM and will attend the 1:15PM exercise class.   Tourist information centre manager.

## 2021-01-19 DIAGNOSIS — D2271 Melanocytic nevi of right lower limb, including hip: Secondary | ICD-10-CM | POA: Diagnosis not present

## 2021-01-19 DIAGNOSIS — L814 Other melanin hyperpigmentation: Secondary | ICD-10-CM | POA: Diagnosis not present

## 2021-01-19 DIAGNOSIS — D2272 Melanocytic nevi of left lower limb, including hip: Secondary | ICD-10-CM | POA: Diagnosis not present

## 2021-01-19 DIAGNOSIS — D1801 Hemangioma of skin and subcutaneous tissue: Secondary | ICD-10-CM | POA: Diagnosis not present

## 2021-01-19 DIAGNOSIS — L821 Other seborrheic keratosis: Secondary | ICD-10-CM | POA: Diagnosis not present

## 2021-01-19 DIAGNOSIS — D225 Melanocytic nevi of trunk: Secondary | ICD-10-CM | POA: Diagnosis not present

## 2021-01-20 ENCOUNTER — Encounter: Payer: Self-pay | Admitting: *Deleted

## 2021-01-21 ENCOUNTER — Ambulatory Visit (INDEPENDENT_AMBULATORY_CARE_PROVIDER_SITE_OTHER): Payer: Medicare Other | Admitting: Neurology

## 2021-01-21 ENCOUNTER — Encounter: Payer: Self-pay | Admitting: Neurology

## 2021-01-21 VITALS — BP 148/84 | HR 83 | Ht 60.0 in | Wt 171.0 lb

## 2021-01-21 DIAGNOSIS — G479 Sleep disorder, unspecified: Secondary | ICD-10-CM | POA: Diagnosis not present

## 2021-01-21 DIAGNOSIS — I25118 Atherosclerotic heart disease of native coronary artery with other forms of angina pectoris: Secondary | ICD-10-CM

## 2021-01-21 DIAGNOSIS — R413 Other amnesia: Secondary | ICD-10-CM

## 2021-01-21 NOTE — Progress Notes (Signed)
Reason for visit: Mild memory disturbance  [name] [name] is an 65 y.o. female  History of present illness:  [name] [name] is a 65 year old right-handed white female with a history of a memory disturbance dating back almost 8 or 9 years.  The patient was last seen in December 2019.  She comes back into the office indicating an ongoing problem with her memory.  She believes there has been some progression over time.  She has had to begin taking frequent notes, she will misplace things about the house frequently.  She is able to keep up with her medications fairly well and her appointments.  She operates a Teacher, music without difficulty.  She does the finances, she is able to cook well, she has no problems with following recipes.  She does report that she feels tired all the time.  She does not sleep well at night, she wakes up multiple times at night.  She does not nap during the day usually.  She has had increased headaches since coming off of verapamil in April 2022, she has had 7 or 8 headaches over the last 4 months, occasionally she will wake up with a headache.  She indicates that she has a mild balance problem, she has not had any falls.  She reports no numbness or weakness of the face, arms, legs, she denies issues controlling the bowels or the bladder.  She returns for further evaluation.  Past Medical History:  Diagnosis Date   Anxiety    husband also cancer pt   Cancer ([place])    lung   Complication of anesthesia    bronchospasms post-op (pulmonary did consult but pt was discharged later that day)  With last surg. she was given albuterol inhaler & had steroid with surg.     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, transient, takes Verapimil for migraine & BP control    PONV (postoperative nausea and vomiting)    PVC (premature ventricular contraction)    Shortness of breath dyspnea    with exertion   Thyroid nodule    benign by biopsy   Varicose veins    left leg   Visual disturbance    Episodic   Vitamin D deficiency    Wears glasses     Past Surgical History:  Procedure Laterality Date   CARPAL TUNNEL RELEASE Left    CESAREAN SECTION     COLONOSCOPY W/ BIOPSIES AND POLYPECTOMY     DILATION AND CURETTAGE OF UTERUS     TONSILLECTOMY     VARICOSE VEIN SURGERY     left   VIDEO ASSISTED THORACOSCOPY (VATS)/ LOBECTOMY Right 07/23/2014   Procedure: VIDEO ASSISTED THORACOSCOPY (VATS)/ LOBECTOMY;  Surgeon: Ivin Poot, MD;  Location: [facility];  Service: Thoracic;  Laterality: Right;   VIDEO BRONCHOSCOPY N/A 07/23/2014   Procedure: VIDEO BRONCHOSCOPY;  Surgeon: Ivin Poot, MD;  Location: [facility];  Service: Thoracic;  Laterality: N/A;   VIDEO BRONCHOSCOPY WITH ENDOBRONCHIAL ULTRASOUND Right 04/17/2014   Procedure: VIDEO BRONCHOSCOPY WITH ENDOBRONCHIAL ULTRASOUND;  Surgeon:  Collene Gobble, MD;  Location: [facility];  Service: Thoracic;  Laterality: Right;   VIDEO BRONCHOSCOPY WITH ENDOBRONCHIAL ULTRASOUND N/A 07/08/2014   Procedure: VIDEO BRONCHOSCOPY WITH ENDOBRONCHIAL ULTRASOUND;  Surgeon: Ivin Poot, MD;  Location: [facility];  Service: Thoracic;  Laterality: N/A;    Family History  Problem Relation Age of Onset   Cancer Mother        cancer of the small intestines   Hypertension Father    Atrial fibrillation Father    Diverticulitis Father    Hypertension Sister    Hypertension Sister    Headache Sister    Hypertension Brother    Diverticulitis Brother     Social history:  reports that she quit smoking about 7 years ago. Her smoking use included cigarettes. She has a 20.00 pack-year  smoking history. She has never used smokeless tobacco. She reports current alcohol use. She reports that she does not use drugs.    Allergies  Allergen Reactions   Advil [Ibuprofen] Other (See Comments)    Causes hematuria    Hydrocodone-Guaifenesin Itching   Sulfa Antibiotics Hives and Swelling   Temazepam Other (See Comments)    dizziness   Plasticized Base [Plastibase] Rash    tape   Zofran [Ondansetron Hcl] Palpitations    Medications:  Prior to Admission medications   Medication Sig Start Date End Date Taking? Authorizing Provider  albuterol (PROAIR HFA) 108 (90 Base) MCG/ACT inhaler Inhale 1 puff into the lungs 3 (three) times daily as needed for wheezing or shortness of breath. 07/20/19   Collene Gobble, MD  aspirin EC 81 MG tablet Take 1 tablet (81 mg total) by mouth daily. 08/16/17   Belva Crome, MD  losartan (COZAAR) 50 MG tablet Take 1 tablet (50 mg total) by mouth daily. 11/05/20   Belva Crome, MD  nitroGLYCERIN (NITROSTAT) 0.4 MG SL tablet Place 1 tablet (0.4 mg total) under the tongue every 5 (five) minutes as needed for chest pain. 12/03/20 03/03/21  Belva Crome, MD  rosuvastatin (CRESTOR) 10 MG tablet Take 10 mg by mouth daily.    [provider]  Vitamin D, Ergocalciferol, (DRISDOL) 50000 units CAPS capsule Take 50,000 Units by mouth every 7 (seven) days.    [provider]    ROS:  Out of a complete 14 system review of symptoms, the patient complains only of the following symptoms, and all other reviewed systems are negative.  Memory troubles Fatigue Frequent waking  Blood pressure (!) 148/84, pulse 83, height 5' (1.524 m), weight 171 lb (77.6 kg).  Physical Exam  General: The patient is alert and cooperative at the time of the examination.  Skin: No significant peripheral edema is noted.   Neurologic Exam  Mental status: The patient is alert and oriented x 3 at the time of the examination. The patient has apparent normal recent  and remote memory, with an apparently normal attention span and concentration ability.  Mini-Mental status examination done today shows a total score 29/30.   Cranial nerves: Facial symmetry is present. Speech is normal, no aphasia or dysarthria is noted. Extraocular movements are full. Visual fields are full.  Motor: The patient has good strength in all 4 extremities.  Sensory examination: Soft touch sensation is symmetric on the face, arms, and legs.  Coordination: The patient has good finger-nose-finger and heel-to-shin bilaterally.  Gait and station: The patient has a normal gait. Tandem gait is very minimally unsteady. Romberg is negative. No drift is  seen.  Reflexes: Deep tendon reflexes are symmetric.   Assessment/Plan:  1.  Mild cognitive impairment  2.  Chronic fatigue  The patient continues to have some troubles with memory that may have worsened over time.  She also reports some problems with chronic fatigue during the day, she never feels rested, she wakes up frequently at night.  Given this history, I will make referral for a sleep evaluation to exclude obstructive or central apnea.  The patient will follow-up in 6 months, we may consider addition of a medication for memory in the future.  She can be followed by Dr. Brett Fairy in the future.  The Mini-Mental status examination score has not changed since her last visit.  Jill Alexanders MD 01/21/2021 9:45 AM  [facility] [address] [place], [place] [address]  Phone [phone] Fax [phone]

## 2021-01-26 ENCOUNTER — Other Ambulatory Visit: Payer: Self-pay | Admitting: Internal Medicine

## 2021-01-26 DIAGNOSIS — C3490 Malignant neoplasm of unspecified part of unspecified bronchus or lung: Secondary | ICD-10-CM

## 2021-01-28 ENCOUNTER — Other Ambulatory Visit: Payer: Medicare Other

## 2021-02-06 ENCOUNTER — Other Ambulatory Visit: Payer: Medicare Other

## 2021-02-12 ENCOUNTER — Telehealth (HOSPITAL_COMMUNITY): Payer: Self-pay

## 2021-02-12 ENCOUNTER — Ambulatory Visit: Payer: Medicare Other | Admitting: Internal Medicine

## 2021-02-12 ENCOUNTER — Telehealth (HOSPITAL_COMMUNITY): Payer: Self-pay | Admitting: *Deleted

## 2021-02-12 ENCOUNTER — Ambulatory Visit
Admission: RE | Admit: 2021-02-12 | Discharge: 2021-02-12 | Disposition: A | Discharge status: Home / Self Care | Payer: Medicare Other | Source: Ambulatory Visit | Attending: Internal Medicine | Admitting: Internal Medicine

## 2021-02-12 ENCOUNTER — Inpatient Hospital Stay: Payer: Medicare Other | Attending: Internal Medicine

## 2021-02-12 ENCOUNTER — Other Ambulatory Visit: Payer: Self-pay

## 2021-02-12 DIAGNOSIS — Z902 Acquired absence of lung [part of]: Secondary | ICD-10-CM | POA: Diagnosis not present

## 2021-02-12 DIAGNOSIS — C3490 Malignant neoplasm of unspecified part of unspecified bronchus or lung: Secondary | ICD-10-CM

## 2021-02-12 DIAGNOSIS — C3431 Malignant neoplasm of lower lobe, right bronchus or lung: Secondary | ICD-10-CM

## 2021-02-12 DIAGNOSIS — J432 Centrilobular emphysema: Secondary | ICD-10-CM | POA: Diagnosis not present

## 2021-02-12 DIAGNOSIS — Z923 Personal history of irradiation: Secondary | ICD-10-CM | POA: Insufficient documentation

## 2021-02-12 DIAGNOSIS — J9859 Other diseases of mediastinum, not elsewhere classified: Secondary | ICD-10-CM | POA: Diagnosis not present

## 2021-02-12 DIAGNOSIS — Z9221 Personal history of antineoplastic chemotherapy: Secondary | ICD-10-CM | POA: Insufficient documentation

## 2021-02-12 DIAGNOSIS — I2510 Atherosclerotic heart disease of native coronary artery without angina pectoris: Secondary | ICD-10-CM | POA: Diagnosis not present

## 2021-02-12 LAB — CMP (CANCER CENTER ONLY)
ALT: 14 U/L (ref 0–44)
AST: 16 U/L (ref 15–41)
Albumin: 4 g/dL (ref 3.5–5.0)
Alkaline Phosphatase: 111 U/L (ref 38–126)
Anion gap: 10 (ref 5–15)
BUN: 14 mg/dL (ref 8–23)
CO2: 25 mmol/L (ref 22–32)
Calcium: 9.8 mg/dL (ref 8.9–10.3)
Chloride: 107 mmol/L (ref 98–111)
Creatinine: 0.86 mg/dL (ref 0.44–1.00)
GFR, Estimated: >60 mL/min (ref >60)
Glucose, Bld: 113 mg/dL — ABNORMAL HIGH (ref 70–99)
Potassium: 4.7 mmol/L (ref 3.5–5.1)
Sodium: 142 mmol/L (ref 135–145)
Total Bilirubin: 0.7 mg/dL (ref 0.3–1.2)
Total Protein: 7.2 g/dL (ref 6.5–8.1)

## 2021-02-12 LAB — CBC WITH DIFFERENTIAL (CANCER CENTER ONLY)
Abs Immature Granulocytes: 0.02 10*3/uL (ref 0.00–0.07)
Basophils Absolute: 0 10*3/uL (ref 0.0–0.1)
Basophils Relative: 1 %
Eosinophils Absolute: 0.1 10*3/uL (ref 0.0–0.5)
Eosinophils Relative: 2 %
HCT: 43.6 % (ref 36.0–46.0)
Hemoglobin: 14.2 g/dL (ref 12.0–15.0)
Immature Granulocytes: 0 %
Lymphocytes Relative: 15 %
Lymphs Abs: 1.1 10*3/uL (ref 0.7–4.0)
MCH: 30 pg (ref 26.0–34.0)
MCHC: 32.6 g/dL (ref 30.0–36.0)
MCV: 92 fL (ref 80.0–100.0)
Monocytes Absolute: 0.5 10*3/uL (ref 0.1–1.0)
Monocytes Relative: 6 %
Neutro Abs: 5.5 10*3/uL (ref 1.7–7.7)
Neutrophils Relative %: 76 %
Platelet Count: 255 10*3/uL (ref 150–400)
RBC: 4.74 MIL/uL (ref 3.87–5.11)
RDW: 12.7 % (ref 11.5–15.5)
WBC Count: 7.2 10*3/uL (ref 4.0–10.5)
nRBC: 0 % (ref 0.0–0.2)

## 2021-02-12 MED ORDER — IOPAMIDOL (ISOVUE-300) INJECTION 61%
75.0000 mL | Freq: Once | INTRAVENOUS | Status: AC | PRN
  Administered 2021-02-12: 75 mL via INTRAVENOUS

## 2021-02-12 NOTE — Telephone Encounter (Signed)
Called pt to make sure she was coming to Pulmonary Rehab orientation on Monday 02/16/21 at [place] pt we would do COVID screening and to call us if she cannot make it to her orientation. Gave pt department number. Instructed pt on where to find the department and to wear closed toed shoes for her 6 MWT. Pt was receptive to instructions.

## 2021-02-16 ENCOUNTER — Encounter (HOSPITAL_COMMUNITY)
Admission: RE | Admit: 2021-02-16 | Discharge: 2021-02-16 | Disposition: A | Discharge status: Home / Self Care | Payer: Medicare Other | Source: Ambulatory Visit | Attending: Emergency Medicine | Admitting: Emergency Medicine

## 2021-02-16 ENCOUNTER — Other Ambulatory Visit: Payer: Self-pay

## 2021-02-16 VITALS — BP 124/78 | HR 91 | Ht 65.5 in | Wt 171.1 lb

## 2021-02-16 DIAGNOSIS — J449 Chronic obstructive pulmonary disease, unspecified: Secondary | ICD-10-CM | POA: Insufficient documentation

## 2021-02-16 NOTE — Progress Notes (Signed)
Shyia Fillingim 65 y.o. female  Initial Psychosocial Assessment  Pt psychosocial assessment reveals pt's husband died in 09-08-2016 and went through grief counselling through hospice.  Her daughter moved in with her recently and that has been a positive influence except that her daughter brought along 2 dogs and 4 cats.  Pt is currently retired as a Marine scientist. Pt hobbies include gardening even though the heat this summer has kept her inside. Pt reports her stress level is moderate. Areas of stress/anxiety include Health and dealing with household repairs since her husband passed .  Pt does not exhibit signs of depression. Rafia does have sleep concerns that cause her to have trouble falling asleep and staying asleep and is scheduled for a sleep study in the near future. Pt shows good  coping skills with positive outlook .  Offered emotional support and reassurance. Monitor and evaluate psychosocial component every 30 days while participating in pulmonary rehab.   Goal(s): Improved management of sleep patterns, stress Improved coping skills by relaxation and stress management  Help patient work toward returning to meaningful activities that improve patient's QOL and are attainable with patient's lung disease   02/16/2021 1:10 PM

## 2021-02-16 NOTE — Progress Notes (Signed)
[name] [name] 65 y.o. female Pulmonary Rehab Orientation Note This patient who was referred to Pulmonary rehab by Dr. Lamonte Sakai with the diagnosis of COPD stage 2 moderate arrived today in Cardiac and Pulmonary Rehab. She arrived ambulatory with normal gait. She does not carry portable oxygen.  Per pt, she uses oxygen never. Color good, skin warm and dry. Patient is oriented to time and place. Patient's medical history, psychosocial health, and medications reviewed. Psychosocial assessment reveals pt lives with their daughter. Her husband passed away in 2016-08-22, she went through a grief program through Hospice and it was helpful. Pt is currently retired. Pt hobbies include gardening. Pt reports her stress level is moderate. Areas of stress/anxiety include Health and being the sole caretaker of her 67 year old house . She becomes frustrated with the repairs of her house even though her daughter lives with her. She also has sleep concerns.  She has trouble falling asleep and staying asleep and is tired when she awakens in the morning.  She is scheduled for a sleep study in the near future.Pt does not exhibit  signs of depression, except the sleep concerns. PHQ2/9 score 0/3. Pt shows good  coping skills with positive outlook . Will continue to monitor and evaluate progress toward psychosocial goal(s) of improved sleeping patterns and ability to handle her stress in healthy ways and will reevaluate every 30 days. Physical assessment reveals heart rate is normal, breath sounds clear to auscultation, no wheezes, rales, or rhonchi. Grip strength equal, strong. Distal pulses 3+ without peripheral edema. Patient reports she does take medications as prescribed. Patient states she follows a Regular diet. The patient reports no specific efforts to gain or lose weight.. Patient's weight will be monitored closely. Demonstration and practice of PLB using pulse oximeter. Patient able to return demonstration satisfactorily.  Safety and hand hygiene in the exercise area reviewed with patient. Patient voices understanding of the information reviewed. Department expectations discussed with patient and achievable goals were set. The patient shows enthusiasm about attending the program and we look forward to working with this nice lady. The patient completed a 6 min walk test on 02/16/2021 and to begin exercise on 02/24/2021 in the 1:15 pm class. [id number]

## 2021-02-17 ENCOUNTER — Inpatient Hospital Stay (HOSPITAL_BASED_OUTPATIENT_CLINIC_OR_DEPARTMENT_OTHER): Payer: Medicare Other | Admitting: Internal Medicine

## 2021-02-17 VITALS — BP 150/93 | HR 96 | Temp 97.4°F | Resp 18 | Ht 65.5 in | Wt 170.3 lb

## 2021-02-17 DIAGNOSIS — Z923 Personal history of irradiation: Secondary | ICD-10-CM | POA: Diagnosis not present

## 2021-02-17 DIAGNOSIS — Z9221 Personal history of antineoplastic chemotherapy: Secondary | ICD-10-CM | POA: Diagnosis not present

## 2021-02-17 DIAGNOSIS — C3431 Malignant neoplasm of lower lobe, right bronchus or lung: Secondary | ICD-10-CM | POA: Diagnosis not present

## 2021-02-17 DIAGNOSIS — Z902 Acquired absence of lung [part of]: Secondary | ICD-10-CM | POA: Diagnosis not present

## 2021-02-17 DIAGNOSIS — C3490 Malignant neoplasm of unspecified part of unspecified bronchus or lung: Secondary | ICD-10-CM

## 2021-02-17 NOTE — Progress Notes (Signed)
[facility] [phone]   [phone]  OFFICE PROGRESS NOTE  [name], [place] [address]  DIAGNOSIS: Primary cancer of right lower lobe of lung   Staging form: Lung, AJCC 7th Edition     Clinical: Stage IIIA (T2a, N2, M0) - Unsigned       Staging comments: Adenocarcinoma  Foundation 1 molecular studies: positive for [id number], KRASG13C, [id number]*, [id number]*11, GATA6 amplification and [id number] Negative for BRAF, ALK, MET, RET and ERBB2  PRIOR THERAPY:  1) Concurrent chemoradiation with chemotherapy in the form of weekly carboplatin for an AUC of 2 and paclitaxel 45 mg/m given concurrent with radiation 2) Right VATS (video-assisted thoracoscopic surgery) with right lower lobectomy and mediastinal lymph node dissection under the care of [name] 07/23/2014. The final pathology showed residual 1.8 cm moderate to poorly differentiated invasive adenocarcinoma with no pleural or lymphovascular invasion. The dissected lymph nodes were negative for malignancy and the final pathologic stage was (pT1a, pN0, pMx).  CURRENT THERAPY: Observation.   INTERVAL HISTORY: [name] Servidio89y.[id number]. female returns to the clinic today for annual follow-up visit.  The patient is feeling fine today with no concerning complaints.  She denied having any chest pain but continues to have shortness of breath with exertion secondary to COPD and congestive heart failure.  Her last ejection fraction was around 45%.  The patient is currently undergoing cardiopulmonary rehabilitation.  She denied having any cough or hemoptysis.  She has no nausea, vomiting, diarrhea or constipation.  She has no headache or visual changes.  She is here today for evaluation with repeat CT scan of the chest for restaging of her disease.  MEDICAL HISTORY:  Past Medical History:  Diagnosis Date   Anxiety    husband also cancer pt   [place]    Complication of anesthesia    bronchospasms post-op (pulmonary did consult but pt was discharged later that day)  With last surg. she was given albuterol inhaler & had steroid with surg.     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  MEDICATIONS:  Current Outpatient Medications  Medication Sig Dispense Refill   albuterol (PROAIR HFA) 108 (90 Base) MCG/ACT inhaler Inhale 1 puff into the lungs 3 (three) times daily as needed for wheezing or shortness of breath. 6.7 g 5   aspirin EC 81 MG tablet Take 1 tablet (81 mg total) by mouth daily. 90 tablet 3   losartan (  COZAAR) 50 MG tablet Take 1 tablet (50 mg total) by mouth daily. 90 tablet 3   nitroGLYCERIN (NITROSTAT) 0.4 MG SL tablet Place 1 tablet (0.4 mg total) under the tongue every 5 (five) minutes as needed for chest pain. 25 tablet 6   rosuvastatin (CRESTOR) 10 MG tablet Take 10 mg by mouth daily.     Vitamin D, Ergocalciferol, (DRISDOL) 50000 units CAPS capsule Take 50,000 Units by mouth every 7 (seven) days.     Current Facility-Administered Medications  Medication Dose Route Frequency  Provider Last Rate Last Admin   Ampicillin-Sulbactam (UNASYN) 3 g in sodium chloride 0.9 % 100 mL IVPB  3 g Intravenous Once Avelina Laine, PA-C        SURGICAL HISTORY:  Past Surgical History:  Procedure Laterality Date   CARPAL TUNNEL RELEASE Left    CESAREAN SECTION     COLONOSCOPY W/ BIOPSIES AND POLYPECTOMY     DILATION AND CURETTAGE OF UTERUS     TONSILLECTOMY     VARICOSE VEIN SURGERY     left   VIDEO ASSISTED THORACOSCOPY (VATS)/ LOBECTOMY Right 07/23/2014   Procedure: VIDEO ASSISTED THORACOSCOPY (VATS)/ LOBECTOMY;  Surgeon: Ivin Poot, MD;  Location: [facility];  Service: Thoracic;  Laterality: Right;   VIDEO BRONCHOSCOPY N/A 07/23/2014   Procedure: VIDEO BRONCHOSCOPY;  Surgeon: Ivin Poot, MD;  Location: [facility];  Service: Thoracic;  Laterality: N/A;   VIDEO BRONCHOSCOPY WITH ENDOBRONCHIAL ULTRASOUND Right 04/17/2014   Procedure: VIDEO BRONCHOSCOPY WITH ENDOBRONCHIAL ULTRASOUND;  Surgeon: Collene Gobble, MD;  Location: [facility];  Service: Thoracic;  Laterality: Right;   VIDEO BRONCHOSCOPY WITH ENDOBRONCHIAL ULTRASOUND N/A 07/08/2014   Procedure: VIDEO BRONCHOSCOPY WITH ENDOBRONCHIAL ULTRASOUND;  Surgeon: Ivin Poot, MD;  Location: [facility];  Service: Thoracic;  Laterality: N/A;    REVIEW OF SYSTEMS:  A comprehensive review of systems was negative except for: Respiratory: positive for dyspnea on exertion   PHYSICAL EXAMINATION: General appearance: alert, cooperative and no distress Head: Normocephalic, without obvious abnormality, atraumatic Neck: no adenopathy, no JVD, supple, symmetrical, trachea midline and thyroid not enlarged, symmetric, no tenderness/mass/nodules Lymph nodes: Cervical, supraclavicular, and axillary nodes normal. Resp: clear to auscultation bilaterally Back: symmetric, no curvature. ROM normal. No CVA tenderness. Cardio: regular rate and rhythm, S1, S2 normal, no murmur, click, rub or gallop GI: soft, non-tender; bowel sounds normal; no masses,  no  organomegaly Extremities: extremities normal, atraumatic, no cyanosis or edema  ECOG PERFORMANCE STATUS: 0 - Asymptomatic  Blood pressure (!) 150/93, pulse 96, temperature (!) 97.4 F (36.3 C), temperature source Tympanic, resp. rate 18, height 5' 5.5" (1.664 m), weight 170 lb 4.8 oz (77.2 kg), SpO2 97 %.  LABORATORY DATA: Lab Results  Component Value Date   WBC 7.2 02/12/2021   HGB 14.2 02/12/2021   HCT 43.6 02/12/2021   MCV 92.0 02/12/2021   PLT 255 02/12/2021      Chemistry      Component Value Date/Time   NA 142 02/12/2021 1106   NA 141 11/14/2020 1029   NA 142 01/19/2017 0958   K 4.7 02/12/2021 1106   K 4.3 01/19/2017 0958   CL 107 02/12/2021 1106   CO2 25 02/12/2021 1106   CO2 25 01/19/2017 0958   BUN 14 02/12/2021 1106   BUN 11 11/14/2020 1029   BUN 15.2 01/19/2017 0958   CREATININE 0.86 02/12/2021 1106   CREATININE 0.8 01/19/2017 0958      Component Value Date/Time   CALCIUM 9.8 02/12/2021 1106  CALCIUM 9.5 01/19/2017 0958   ALKPHOS 111 02/12/2021 1106   ALKPHOS 121 01/19/2017 0958   AST 16 02/12/2021 1106   AST 19 01/19/2017 0958   ALT 14 02/12/2021 1106   ALT 20 01/19/2017 0958   BILITOT 0.7 02/12/2021 1106   BILITOT 0.64 01/19/2017 0958       RADIOGRAPHIC STUDIES: CT CHEST W CONTRAST  Result Date: 02/13/2021 CLINICAL DATA:  Primary Cancer Type: Lung Imaging Indication: Routine surveillance Interval therapy since last imaging? No Initial Cancer Diagnosis Date: 04/17/2014; Established by: Biopsy-proven Detailed Pathology: Stage IIIA NSCLC, adenocarcinoma. Primary Tumor location: Right lower lobe. Surgeries: Right lower lobectomy 07/24/2014. Chemotherapy: Yes; Ongoing?  No; Most recent administration: 2016 Immunotherapy? No Radiation therapy? Yes; Date Range: 05/06/2014 - 06/05/2014; Target: Right lung EXAM: CT CHEST WITH CONTRAST TECHNIQUE: Multidetector CT imaging of the chest was performed during intravenous contrast administration. CONTRAST:  44m  ISOVUE-300 IOPAMIDOL (ISOVUE-300) INJECTION 61% COMPARISON:  Most recent CT chest 02/05/2020.  04/23/2014 PET-CT. FINDINGS: Cardiovascular: Aortic atherosclerosis. Tortuous thoracic aorta. Normal heart size, without pericardial effusion. Lad and right coronary artery calcification. No central pulmonary embolism, on this non-dedicated study. Mediastinum/Nodes: No supraclavicular adenopathy. The left axillary node of interest measures 6 mm on 32/2 and is unchanged. No mediastinal adenopathy. The right paratracheal lesion described on the prior exam is low-density and present back to 07/03/2014, favored to represent a pericardial recess. No hilar adenopathy. Lungs/Pleura: No pleural fluid.  Right lower lobectomy. Mild centrilobular emphysema. Mild lingular scarring. Upper Abdomen: Segment 2 hepatic cyst of 12 mm. Normal imaged portions of the spleen, stomach, pancreas, gallbladder, biliary tract, adrenal glands, left kidney. Musculoskeletal: No acute osseous abnormality. IMPRESSION: 1. Status post right lower lobectomy, without findings of recurrent or metastatic disease. 2. The left axillary node of interest on the prior exam is similar in size and not pathologic by size criteria. 3. No thoracic adenopathy. The previously detailed finding the right paratracheal space represents a pericardial recess. 4. Aortic atherosclerosis (ICD10-I70.0), coronary artery atherosclerosis and emphysema (ICD10-J43.9). Electronically Signed   By: KAbigail MiyamotoM.D.   On: 02/13/2021 10:26     ASSESSMENT AND PLAN:  This is a very pleasant 65years old white female with a stage IIIA non-small cell lung cancer status post neoadjuvant concurrent chemoradiation with weekly carboplatin and paclitaxel followed by right lower lobectomy and lymph node dissection with the final pathology consistent with a pathological stage pT1a, pN0. The patient has been on observation since 2016. The patient is feeling fine today with no concerning  complaints except for the baseline shortness of breath secondary to COPD and congestive heart failure. She had repeat CT scan of the chest performed recently.  I personally and independently reviewed the scans and discussed the results with the patient today. Her scan showed no concerning findings for disease recurrence or metastasis. I recommended for the patient to continue on observation with repeat CT scan of the chest in 1 year. She was advised to call immediately if she has any concerning symptoms in the interval. The patient voices understanding of current disease status and treatment options and is in agreement with the current care plan. All questions were answered. The patient knows to call the clinic with any problems, questions or concerns. We can certainly see the patient much sooner if necessary.  Disclaimer: This note was dictated with voice recognition software. Similar sounding words can inadvertently be transcribed and may not be corrected upon review.

## 2021-02-17 NOTE — Progress Notes (Signed)
Pulmonary Individual Treatment Plan  Patient Details  Name: [name] [name] MRN: [id number] Date of Birth: [birth year] Referring Provider:   April Manson Pulmonary Rehab Walk Test from 02/16/2021 in [facility]  Referring Provider Dr. Lamonte Sakai       Initial Encounter Date:  Flowsheet Row Pulmonary Rehab Walk Test from 02/16/2021 in [facility]  Date 02/16/21       Visit Diagnosis: Stage 2 moderate COPD by GOLD classification ([place])  Patient's Home Medications on Admission:   Current Outpatient Medications:    albuterol (PROAIR HFA) 108 (90 Base) MCG/ACT inhaler, Inhale 1 puff into the lungs 3 (three) times daily as needed for wheezing or shortness of breath., Disp: 6.7 g, Rfl: 5   aspirin EC 81 MG tablet, Take 1 tablet (81 mg total) by mouth daily., Disp: 90 tablet, Rfl: 3   losartan (COZAAR) 50 MG tablet, Take 1 tablet (50 mg total) by mouth daily., Disp: 90 tablet, Rfl: 3   nitroGLYCERIN (NITROSTAT) 0.4 MG SL tablet, Place 1 tablet (0.4 mg total) under the tongue every 5 (five) minutes as needed for chest pain., Disp: 25 tablet, Rfl: 6   rosuvastatin (CRESTOR) 10 MG tablet, Take 10 mg by mouth daily., Disp: , Rfl:    Vitamin D, Ergocalciferol, (DRISDOL) 50000 units CAPS capsule, Take 50,000 Units by mouth every 7 (seven) days., Disp: , Rfl:   Current Facility-Administered Medications:    Ampicillin-Sulbactam (UNASYN) 3 g in sodium chloride 0.9 % 100 mL IVPB, 3 g, Intravenous, Once, Avelina Laine, PA-C  Past Medical History: Past Medical History:  Diagnosis Date   Anxiety    husband also cancer pt   Cancer ([place])    lung   Complication of anesthesia    bronchospasms post-op (pulmonary did consult but pt was discharged later that day)  With last surg. she was given albuterol inhaler & had steroid with surg.     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, transient, takes Verapimil for migraine & BP control    PONV (postoperative nausea and vomiting)    PVC (premature ventricular contraction)    Shortness of breath dyspnea    with exertion   Thyroid nodule    benign by biopsy   Varicose veins    left leg   Visual disturbance    Episodic   Vitamin D deficiency    Wears glasses     Tobacco Use: Social History   Tobacco Use  Smoking Status Former   Packs/day: 0.50   Years: 40.00   Pack years: 20.00   Types: Cigarettes   Quit date: 03/17/2013   Years since quitting: 7.9  Smokeless Tobacco Never    Labs: Recent Review Flowsheet Data     Labs for ITP Cardiac and Pulmonary Rehab Latest Ref Rng & Units 07/24/2014 10/11/2017 04/30/2019 04/21/2020 11/14/2020   Cholestrol 100 - 199 mg/dL - 139 148 144 127   LDLCALC 0 - 99 mg/dL - 55 53 44 48   HDL >39 mg/dL - 73 82 87 68  Trlycerides 0 - 149 mg/dL - 54 64 60 46   PHART 7.350 - 7.450 7.424 - - - -   PCO2ART 35.0 - 45.0 mmHg 37.7 - - - -   HCO3 20.0 - 24.0 mEq/L 24.7(H) - - - -   TCO2 0 - 100 mmol/L 26 - - - -   ACIDBASEDEF 0.0 - 2.0 mmol/L - - - - -   O2SAT % 97.0 - - - -       Capillary Blood Glucose: Lab Results  Component Value Date   GLUCAP 119 (H) 04/23/2014     Pulmonary Assessment Scores:  Pulmonary Assessment Scores     Row Name 02/16/21 1206         ADL UCSD   ADL Phase Entry     SOB Score total 40           CAT Score   CAT Score 16             UCSD: Self-administered rating of dyspnea associated with activities of daily living (ADLs) 6-point scale (0 = "not at all" to 5 = "maximal or unable to do because of breathlessness")  Scoring Scores  range from 0 to 120.  Minimally important difference is 5 units  CAT: CAT can identify the health impairment of COPD patients and is better correlated with disease progression.  CAT has a scoring range of zero to 40. The CAT score is classified into four groups of low (less than 10), medium (10 - 20), high (21-30) and very high (31-40) based on the impact level of disease on health status. A CAT score over 10 suggests significant symptoms.  A worsening CAT score could be explained by an exacerbation, poor medication adherence, poor inhaler technique, or progression of COPD or comorbid conditions.  CAT MCID is 2 points  mMRC: mMRC (Modified Medical Research Council) Dyspnea Scale is used to assess the degree of baseline functional disability in patients of respiratory disease due to dyspnea. No minimal important difference is established. A decrease in score of 1 point or greater is considered a positive change.   Pulmonary Function Assessment:  Pulmonary Function Assessment - 02/16/21 1158       Post Bronchodilator Spirometry Results   FEV1% 71 %    FEV1/FVC Ratio 63      Breath   Bilateral Breath Sounds Clear    Shortness of Breath Yes;Limiting activity             Exercise Target Goals: Exercise Program Goal: Individual exercise prescription set using results from initial 6 min walk test and THRR while considering  patient's activity barriers and safety.   Exercise Prescription Goal: Initial exercise prescription builds to 30-45 minutes a day of aerobic activity, 2-3 days per week.  Home exercise guidelines will be given to patient during program as part of exercise prescription that the participant will acknowledge.  Activity Barriers & Risk Stratification:  Activity Barriers & Cardiac Risk Stratification - 02/16/21 1143       Activity Barriers & Cardiac Risk Stratification   Activity Barriers Arthritis;Back Problems;Joint Problems;Deconditioning;Muscular  Weakness;Shortness of Breath             6 Minute Walk:  6 Minute Walk     Row Name 02/16/21 1219         6 Minute Walk   Phase Initial     Distance 1600 feet     Walk Time 6 minutes     # of Rest  Breaks 0     MPH 3.03     METS 3.71     RPE 11     Perceived Dyspnea  1     VO2 Peak 12.98     Symptoms No     Resting HR 80 bpm     Resting BP 124/78     Resting Oxygen Saturation  95 %     Exercise Oxygen Saturation  during 6 min walk 92 %     Max Ex. HR 123 bpm     Max Ex. BP 126/80     2 Minute Post BP 126/80           Interval HR   1 Minute HR 108     2 Minute HR 118     3 Minute HR 119     4 Minute HR 122     5 Minute HR 123     6 Minute HR 120     2 Minute Post HR 94     Interval Heart Rate? Yes           Interval Oxygen   Interval Oxygen? Yes     Baseline Oxygen Saturation % 95 %     1 Minute Oxygen Saturation % 95 %     1 Minute Liters of Oxygen 0 L     2 Minute Oxygen Saturation % 92 %     2 Minute Liters of Oxygen 0 L     3 Minute Oxygen Saturation % 94 %     3 Minute Liters of Oxygen 0 L     4 Minute Oxygen Saturation % 93 %     4 Minute Liters of Oxygen 0 L     5 Minute Oxygen Saturation % 93 %     5 Minute Liters of Oxygen 0 L     6 Minute Oxygen Saturation % 93 %     6 Minute Liters of Oxygen 0 L     2 Minute Post Oxygen Saturation % 97 %     2 Minute Post Liters of Oxygen 0 L              Oxygen Initial Assessment:  Oxygen Initial Assessment - 02/16/21 1158       Home Oxygen   Home Oxygen Device None    Sleep Oxygen Prescription None    Home Exercise Oxygen Prescription None    Home Resting Oxygen Prescription None             Oxygen Re-Evaluation:   Oxygen Discharge (Final Oxygen Re-Evaluation):   Initial Exercise Prescription:  Initial Exercise Prescription - 02/16/21 1200       Date of Initial Exercise RX and Referring Provider   Date 02/16/21    Referring Provider Dr. Lamonte Sakai    Expected Discharge Date  04/23/21      Treadmill   MPH 2    Grade 0    Minutes 15      NuStep   Level 2    SPM 80    Minutes 15      Prescription Details   Frequency (times per week) 2    Duration Progress to 30 minutes of continuous aerobic without signs/symptoms of physical distress      Intensity   THRR 40-80% of Max Heartrate 61-122    Ratings of Perceived Exertion 11-13    Perceived Dyspnea 0-4      Progression   Progression Continue to progress  workloads to maintain intensity without signs/symptoms of physical distress.      Resistance Training   Training Prescription Yes    Weight Red bands    Reps 10-15             Perform Capillary Blood Glucose checks as needed.  Exercise Prescription Changes:   Exercise Comments:   Exercise Goals and Review:   Exercise Goals     Row Name 02/16/21 1215             Exercise Goals   Increase Physical Activity Yes       Intervention Provide advice, education, support and counseling about physical activity/exercise needs.;Develop an individualized exercise prescription for aerobic and resistive training based on initial evaluation findings, risk stratification, comorbidities and participant's personal goals.       Expected Outcomes Short Term: Attend rehab on a regular basis to increase amount of physical activity.;Long Term: Add in home exercise to make exercise part of routine and to increase amount of physical activity.;Long Term: Exercising regularly at least 3-5 days a week.       Increase Strength and Stamina Yes       Intervention Provide advice, education, support and counseling about physical activity/exercise needs.;Develop an individualized exercise prescription for aerobic and resistive training based on initial evaluation findings, risk stratification, comorbidities and participant's personal goals.       Expected Outcomes Short Term: Increase workloads from initial exercise prescription for resistance, speed, and METs.;Short  Term: Perform resistance training exercises routinely during rehab and add in resistance training at home;Long Term: Improve cardiorespiratory fitness, muscular endurance and strength as measured by increased METs and functional capacity (6MWT)       Able to understand and use rate of perceived exertion (RPE) scale Yes       Intervention Provide education and explanation on how to use RPE scale       Expected Outcomes Short Term: Able to use RPE daily in rehab to express subjective intensity level;Long Term:  Able to use RPE to guide intensity level when exercising independently       Able to understand and use Dyspnea scale Yes       Intervention Provide education and explanation on how to use Dyspnea scale       Expected Outcomes Short Term: Able to use Dyspnea scale daily in rehab to express subjective sense of shortness of breath during exertion;Long Term: Able to use Dyspnea scale to guide intensity level when exercising independently       Knowledge and understanding of Target Heart Rate Range (THRR) Yes       Intervention Provide education and explanation of THRR including how the numbers were predicted and where they are located for reference       Expected Outcomes Short Term: Able to state/look up THRR;Short Term: Able to use daily as guideline for intensity in rehab;Long Term: Able to use THRR to govern intensity when exercising independently       Understanding of Exercise Prescription Yes       Intervention Provide education, explanation, and written materials on patient's individual exercise prescription       Expected Outcomes Short Term: Able to explain program exercise prescription;Long Term: Able to explain home exercise prescription to exercise independently                Exercise Goals Re-Evaluation :  Exercise Goals Re-Evaluation     [place] Name 02/16/21 1216  Exercise Goal Re-Evaluation   Exercise Goals Review Increase Physical Activity;Increase Strength  and Stamina;Able to understand and use rate of perceived exertion (RPE) scale;Able to understand and use Dyspnea scale;Knowledge and understanding of Target Heart Rate Range (THRR);Understanding of Exercise Prescription       Comments Pt is scheduled to begin exercise 02/24/21. Will monitor and progress as able.       Expected Outcomes Through exercise at rehab and home Drea will decrease shortness of breath with daily activities and feel confident in carrying out an exercise regimen at home.                Discharge Exercise Prescription (Final Exercise Prescription Changes):   Nutrition:  Target Goals: Understanding of nutrition guidelines, daily intake of sodium 1500mg , cholesterol 200mg , calories 30% from fat and 7% or less from saturated fats, daily to have 5 or more servings of fruits and vegetables.  Biometrics:  Pre Biometrics - 02/16/21 1144       Pre Biometrics   Height 5' 5.5" (1.664 m)    Weight 77.6 kg    BMI (Calculated) 28.03    Grip Strength 25 kg              Nutrition Therapy Plan and Nutrition Goals:   Nutrition Assessments:  MEDIFICTS Score Key: ?70 Need to make dietary changes  40-70 Heart Healthy Diet ? 40 Therapeutic Level Cholesterol Diet   Picture Your Plate Scores: <19 Unhealthy dietary pattern with much room for improvement. 41-50 Dietary pattern unlikely to meet recommendations for good health and room for improvement. 51-60 More healthful dietary pattern, with some room for improvement.  >60 Healthy dietary pattern, although there may be some specific behaviors that could be improved.    Nutrition Goals Re-Evaluation:   Nutrition Goals Discharge (Final Nutrition Goals Re-Evaluation):   Psychosocial: Target Goals: Acknowledge presence or absence of significant depression and/or stress, maximize coping skills, provide positive support system. Participant is able to verbalize types and ability to use techniques and skills needed  for reducing stress and depression.  Initial Review & Psychosocial Screening:  Initial Psych Review & Screening - 02/16/21 09/06/1205       Initial Review   Current issues with Current Stress Concerns;Current Sleep Concerns    Source of Stress Concerns Chronic Illness;Unable to perform yard/household activities;Unable to participate in former interests or hobbies    Comments Zarin's husband died in 2016/09/06 and she struggles with sole ownership of household repairs with a 42 year old house.  Her daughter moved in with her, it has been a positive thing except for the 2 dogs and 4 cats.  She has trouble falling asleep and staying asleep and feels tired in the morning and is scheduled for a sleep study soon.      Family Dynamics   Good Support System? Yes   daughter is very supportive     Barriers   Psychosocial barriers to participate in program Psychosocial barriers identified (see note)   will work with relaxation and stress techniques to see if they improve sleep patterns and handling of stress.     Screening Interventions   Interventions Encouraged to exercise    Expected Outcomes Long Term goal: The participant improves quality of Life and PHQ9 Scores as seen by post scores and/or verbalization of changes;Long Term Goal: Stressors or current issues are controlled or eliminated.;Short Term goal: Utilizing psychosocial counselor, staff and physician to assist with identification of specific Stressors or current issues interfering with  healing process. Setting desired goal for each stressor or current issue identified.             Quality of Life Scores:  Scores of 19 and below usually indicate a poorer quality of life in these areas.  A difference of  2-3 points is a clinically meaningful difference.  A difference of 2-3 points in the total score of the Quality of Life Index has been associated with significant improvement in overall quality of life, self-image, physical symptoms, and general  health in studies assessing change in quality of life.  PHQ-9: Recent Review Flowsheet Data     Depression screen [facility] 2/9 02/16/2021 07/10/2018 06/05/2014   Decreased Interest 0 0 0   Down, Depressed, Hopeless 0 0 0   PHQ - 2 Score 0 0 0   Altered sleeping 3 0 -   Tired, decreased energy 0 1 -   Change in appetite 0 0 -   Feeling bad or failure about yourself  0 0 -   Trouble concentrating 0 0 -   Moving slowly or fidgety/restless 0 0 -   Suicidal thoughts 0 0 -   PHQ-9 Score - 1 -   Difficult doing work/chores Not difficult at all Not difficult at all -      Interpretation of Total Score  Total Score Depression Severity:  1-4 = Minimal depression, 5-9 = Mild depression, 10-14 = Moderate depression, 15-19 = Moderately severe depression, 20-27 = Severe depression   Psychosocial Evaluation and Intervention:  Psychosocial Evaluation - 02/16/21 1222       Psychosocial Evaluation & Interventions   Interventions Encouraged to exercise with the program and follow exercise prescription;Stress management education;Relaxation education    Comments In hopes that exercise will improve quality of life, is having a sleep study to see if her sleep concerns are connected to sleep disorders.    Expected Outcomes For [place] to sleep better and handle her stressors in healthy ways.    Continue Psychosocial Services  Follow up required by staff             Psychosocial Re-Evaluation:   Psychosocial Discharge (Final Psychosocial Re-Evaluation):   Education: Education Goals: Education classes will be provided on a weekly basis, covering required topics. Participant will state understanding/return demonstration of topics presented.  Learning Barriers/Preferences:  Learning Barriers/Preferences - 02/16/21 1224       Learning Barriers/Preferences   Learning Barriers None    Learning Preferences Audio;Computer/Internet;Group Instruction;Individual Instruction;Pictoral;Skilled  Demonstration;Verbal Instruction;Written Material             Education Topics: Risk Factor Reduction:  -Group instruction that is supported by a PowerPoint presentation. Instructor discusses the definition of a risk factor, different risk factors for pulmonary disease, and how the heart and lungs work together.     Nutrition for Pulmonary Patient:  -Group instruction provided by PowerPoint slides, verbal discussion, and written materials to support subject matter. The instructor gives an explanation and review of healthy diet recommendations, which includes a discussion on weight management, recommendations for fruit and vegetable consumption, as well as protein, fluid, caffeine, fiber, sodium, sugar, and alcohol. Tips for eating when patients are short of breath are discussed.   Pursed Lip Breathing:  -Group instruction that is supported by demonstration and informational handouts. Instructor discusses the benefits of pursed lip and diaphragmatic breathing and detailed demonstration on how to preform both.     Oxygen Safety:  -Group instruction provided by PowerPoint, verbal discussion, and written material  to support subject matter. There is an overview of "What is Oxygen" and "Why do we need it".  Instructor also reviews how to create a safe environment for oxygen use, the importance of using oxygen as prescribed, and the risks of noncompliance. There is a brief discussion on traveling with oxygen and resources the patient may utilize.   Oxygen Equipment:  -Group instruction provided by [facility] Staff utilizing handouts, written materials, and equipment demonstrations. Flowsheet Row PULMONARY REHAB OTHER RESPIRATORY from 08/17/2018 in [place]  Date 07/20/18  Educator Ace Gins Rep  Instruction Review Code 1- Verbalizes Understanding       Signs and Symptoms:  -Group instruction provided by written material and verbal discussion to support  subject matter. Warning signs and symptoms of infection, stroke, and heart attack are reviewed and when to call the physician/911 reinforced. Tips for preventing the spread of infection discussed. Flowsheet Row PULMONARY REHAB OTHER RESPIRATORY from 08/17/2018 in [place]  Date 08/03/18  Educator Remo Lipps  Instruction Review Code 1- Verbalizes Understanding       Advanced Directives:  -Group instruction provided by verbal instruction and written material to support subject matter. Instructor reviews Advanced Directive laws and proper instruction for filling out document.   Pulmonary Video:  -Group video education that reviews the importance of medication and oxygen compliance, exercise, good nutrition, pulmonary hygiene, and pursed lip and diaphragmatic breathing for the pulmonary patient.   Exercise for the Pulmonary Patient:  -Group instruction that is supported by a PowerPoint presentation. Instructor discusses benefits of exercise, core components of exercise, frequency, duration, and intensity of an exercise routine, importance of utilizing pulse oximetry during exercise, safety while exercising, and options of places to exercise outside of rehab.   Flowsheet Row PULMONARY REHAB OTHER RESPIRATORY from 08/17/2018 in [place]  Date 07/27/18  Educator Cloyde Reams  Instruction Review Code 1- Verbalizes Understanding       Pulmonary Medications:  -Verbally interactive group education provided by instructor with focus on inhaled medications and proper administration.   Anatomy and Physiology of the Respiratory System and Intimacy:  -Group instruction provided by PowerPoint, verbal discussion, and written material to support subject matter. Instructor reviews respiratory cycle and anatomical components of the respiratory system and their functions. Instructor also reviews differences in obstructive and restrictive respiratory  diseases with examples of each. Intimacy, Sex, and Sexuality differences are reviewed with a discussion on how relationships can change when diagnosed with pulmonary disease. Common sexual concerns are reviewed. Flowsheet Row PULMONARY REHAB OTHER RESPIRATORY from 08/17/2018 in [place]  Date 08/17/18  Educator  RN  Instruction Review Code 1- Verbalizes Understanding       MD DAY -A group question and answer session with a medical doctor that allows participants to ask questions that relate to their pulmonary disease state.   OTHER EDUCATION -Group or individual verbal, written, or video instructions that support the educational goals of the pulmonary rehab program.   Holiday Eating Survival Tips:  -Group instruction provided by PowerPoint slides, verbal discussion, and written materials to support subject matter. The instructor gives patients tips, tricks, and techniques to help them not only survive but enjoy the holidays despite the onslaught of food that accompanies the holidays.   Knowledge Questionnaire Score:  Knowledge Questionnaire Score - 02/16/21 1207       Knowledge Questionnaire Score   Pre Score 16/18  Core Components/Risk Factors/Patient Goals at Admission:  Personal Goals and Risk Factors at Admission - 02/16/21 1224       Core Components/Risk Factors/Patient Goals on Admission   Improve shortness of breath with ADL's Yes    Intervention Provide education, individualized exercise plan and daily activity instruction to help decrease symptoms of SOB with activities of daily living.    Expected Outcomes Short Term: Improve cardiorespiratory fitness to achieve a reduction of symptoms when performing ADLs;Long Term: Be able to perform more ADLs without symptoms or delay the onset of symptoms             Core Components/Risk Factors/Patient Goals Review:   Goals and Risk Factor Review     Row Name 02/16/21 1224              Core Components/Risk Factors/Patient Goals Review   Personal Goals Review Develop more efficient breathing techniques such as purse lipped breathing and diaphragmatic breathing and practicing self-pacing with activity.;Increase knowledge of respiratory medications and ability to use respiratory devices properly.;Improve shortness of breath with ADL's;Stress;Other                Core Components/Risk Factors/Patient Goals at Discharge (Final Review):   Goals and Risk Factor Review - 02/16/21 1224       Core Components/Risk Factors/Patient Goals Review   Personal Goals Review Develop more efficient breathing techniques such as purse lipped breathing and diaphragmatic breathing and practicing self-pacing with activity.;Increase knowledge of respiratory medications and ability to use respiratory devices properly.;Improve shortness of breath with ADL's;Stress;Other             ITP Comments:   Comments:

## 2021-02-24 ENCOUNTER — Other Ambulatory Visit: Payer: Self-pay

## 2021-02-24 ENCOUNTER — Encounter (HOSPITAL_COMMUNITY)
Admission: RE | Admit: 2021-02-24 | Discharge: 2021-02-24 | Disposition: A | Discharge status: Home / Self Care | Payer: Medicare Other | Source: Ambulatory Visit | Attending: Emergency Medicine | Admitting: Emergency Medicine

## 2021-02-24 ENCOUNTER — Other Ambulatory Visit: Payer: Self-pay | Admitting: Emergency Medicine

## 2021-02-24 VITALS — Wt 172.0 lb

## 2021-02-24 DIAGNOSIS — J449 Chronic obstructive pulmonary disease, unspecified: Secondary | ICD-10-CM | POA: Diagnosis not present

## 2021-02-24 NOTE — Progress Notes (Signed)
Daily Session Note  Patient Details  Name: [name] [name] MRN: [id number] Date of Birth: 04/10/1956 Referring Provider:   April Manson Pulmonary Rehab Walk Test from 02/16/2021 in [place]  Referring Provider Dr. Lamonte Sakai       Encounter Date: 02/24/2021  Check In:  Session Check In - 02/24/21 1416       Check-In   Supervising physician immediately available to respond to emergencies [facility] immediately available    Physician(s) Dr. Sarajane Jews    Location [facility]    Staff Present Rosebud Poles, RN, Quentin Ore, MS, ACSM-CEP, Exercise Physiologist;Lisa Ysidro Evert, RN    Virtual Visit No    Medication changes reported     No    Fall or balance concerns reported    No    Tobacco Cessation No Change    Warm-up and Cool-down Performed as group-led instruction    Resistance Training Performed Yes    VAD Patient? No    PAD/SET Patient? No      Pain Assessment   Currently in Pain? No/denies    Multiple Pain Sites No             Capillary Blood Glucose: No results found for this or any previous visit (from the past 24 hour(s)).   Exercise Prescription Changes - 02/24/21 1400       Response to Exercise   Blood Pressure (Admit) 138/78    Blood Pressure (Exercise) 130/80    Blood Pressure (Exit) 140/80    Heart Rate (Admit) 76 bpm    Heart Rate (Exercise) 111 bpm    Heart Rate (Exit) 81 bpm    Oxygen Saturation (Admit) 95 %    Oxygen Saturation (Exercise) 95 %    Oxygen Saturation (Exit) 95 %    Rating of Perceived Exertion (Exercise) 11    Perceived Dyspnea (Exercise) 0.5    Duration Continue with 30 min of aerobic exercise without signs/symptoms of physical distress.    Intensity --   40-80% HRR     Resistance Training   Training Prescription Yes    Weight Red bands    Reps 10-15    Time 10 Minutes      Treadmill   MPH 2    Grade 0    Minutes 15      NuStep   Level 2    SPM 80     Minutes 15    METs 1.7             Social History   Tobacco Use  Smoking Status Former   Packs/day: 0.50   Years: 40.00   Pack years: 20.00   Types: Cigarettes   Quit date: 03/17/2013   Years since quitting: 7.9  Smokeless Tobacco Never    Goals Met:  Proper associated with RPD/PD & O2 Sat Exercise tolerated well No report of concerns or symptoms today Strength training completed today  Goals Unmet:  Not Applicable  Comments: Service time is from 1320 to 21    Dr. Fransico Him is Medical Director for Cardiac Rehab at [facility].

## 2021-02-26 ENCOUNTER — Encounter (HOSPITAL_COMMUNITY)
Admission: RE | Admit: 2021-02-26 | Discharge: 2021-02-26 | Disposition: A | Discharge status: Home / Self Care | Payer: Medicare Other | Source: Ambulatory Visit | Attending: Emergency Medicine | Admitting: Emergency Medicine

## 2021-02-26 ENCOUNTER — Other Ambulatory Visit: Payer: Self-pay

## 2021-02-26 DIAGNOSIS — J449 Chronic obstructive pulmonary disease, unspecified: Secondary | ICD-10-CM

## 2021-02-26 NOTE — Progress Notes (Signed)
Daily Session Note  Patient Details  Name: Temika Sutphin MRN: [id number] Date of Birth: 01-27-1956 Referring Provider:   April Manson Pulmonary Rehab Walk Test from 02/16/2021 in [place]  Referring Provider Dr. Lamonte Sakai       Encounter Date: 02/26/2021  Check In:  Session Check In - 02/26/21 1426       Check-In   Supervising physician immediately available to respond to emergencies [facility] immediately available    Physician(s) Dr. Florene Glen    Location [facility]    Staff Present Rosebud Poles, RN, BSN;Lisa Ysidro Evert, Cathleen Fears, MS, ACSM-CEP, Exercise Physiologist    Virtual Visit No    Medication changes reported     No    Fall or balance concerns reported    No    Tobacco Cessation No Change    Warm-up and Cool-down Performed as group-led instruction    Resistance Training Performed Yes    VAD Patient? No    PAD/SET Patient? No      Pain Assessment   Currently in Pain? No/denies    Multiple Pain Sites No             Capillary Blood Glucose: No results found for this or any previous visit (from the past 24 hour(s)).    Social History   Tobacco Use  Smoking Status Former   Packs/day: 0.50   Years: 40.00   Pack years: 20.00   Types: Cigarettes   Quit date: 03/17/2013   Years since quitting: 7.9  Smokeless Tobacco Never    Goals Met:  Proper associated with RPD/PD & O2 Sat Exercise tolerated well No report of concerns or symptoms today Strength training completed today  Goals Unmet:  Not Applicable  Comments: Service time is from 1323 to 1440.    Dr. Fransico Him is Medical Director for Cardiac Rehab at [facility].

## 2021-03-03 ENCOUNTER — Encounter (HOSPITAL_COMMUNITY): Payer: Medicare Other

## 2021-03-03 ENCOUNTER — Telehealth (HOSPITAL_COMMUNITY): Payer: Self-pay | Admitting: Internal Medicine

## 2021-03-05 ENCOUNTER — Telehealth (HOSPITAL_COMMUNITY): Payer: Self-pay | Admitting: Internal Medicine

## 2021-03-05 ENCOUNTER — Encounter (HOSPITAL_COMMUNITY): Payer: Medicare Other

## 2021-03-10 ENCOUNTER — Encounter (HOSPITAL_COMMUNITY)
Admission: RE | Admit: 2021-03-10 | Discharge: 2021-03-10 | Disposition: A | Discharge status: Home / Self Care | Payer: Medicare Other | Source: Ambulatory Visit | Attending: Emergency Medicine | Admitting: Emergency Medicine

## 2021-03-10 ENCOUNTER — Other Ambulatory Visit: Payer: Self-pay

## 2021-03-10 VITALS — Wt 171.7 lb

## 2021-03-10 DIAGNOSIS — Z87891 Personal history of nicotine dependence: Secondary | ICD-10-CM | POA: Insufficient documentation

## 2021-03-10 DIAGNOSIS — J449 Chronic obstructive pulmonary disease, unspecified: Secondary | ICD-10-CM | POA: Insufficient documentation

## 2021-03-10 NOTE — Progress Notes (Signed)
Daily Session Note  Patient Details  Name: [name] [name] MRN: [id number] Date of Birth: 09-04-1955 Referring Provider:   April Manson Pulmonary Rehab Walk Test from 02/16/2021 in [place]  Referring Provider Dr. Lamonte Sakai       Encounter Date: 03/10/2021  Check In:  Session Check In - 03/10/21 1423       Check-In   Supervising physician immediately available to respond to emergencies [facility] immediately available    Physician(s) Dr. Gerlean Ren    Location [facility]    Staff Present Rosebud Poles, RN, Quentin Ore, MS, ACSM-CEP, Exercise Physiologist;Lisa Ysidro Evert, RN    Virtual Visit No    Medication changes reported     No    Fall or balance concerns reported    No    Tobacco Cessation No Change    Warm-up and Cool-down Performed as group-led instruction    Resistance Training Performed Yes    VAD Patient? No    PAD/SET Patient? No      Pain Assessment   Currently in Pain? No/denies    Multiple Pain Sites No             Capillary Blood Glucose: No results found for this or any previous visit (from the past 24 hour(s)).   Exercise Prescription Changes - 03/10/21 1500       Response to Exercise   Blood Pressure (Admit) 132/64    Blood Pressure (Exercise) 130/72    Blood Pressure (Exit) 124/80    Heart Rate (Admit) 107 bpm    Heart Rate (Exercise) 117 bpm    Heart Rate (Exit) 99 bpm    Oxygen Saturation (Admit) 97 %    Oxygen Saturation (Exercise) 95 %    Oxygen Saturation (Exit) 100 %    Rating of Perceived Exertion (Exercise) 12    Perceived Dyspnea (Exercise) 1.5    Duration Continue with 30 min of aerobic exercise without signs/symptoms of physical distress.    Intensity THRR unchanged      Resistance Training   Training Prescription Yes    Weight Red Bands    Reps 10-15    Time 10 Minutes      Treadmill   MPH 2    Grade 0    Minutes 15      NuStep   Level 3    SPM 80     Minutes 15    METs 2.6             Social History   Tobacco Use  Smoking Status Former   Packs/day: 0.50   Years: 40.00   Pack years: 20.00   Types: Cigarettes   Quit date: 03/17/2013   Years since quitting: 7.9  Smokeless Tobacco Never    Goals Met:  Proper associated with RPD/PD & O2 Sat Exercise tolerated well No report of concerns or symptoms today Strength training completed today  Goals Unmet:  Not Applicable  Comments: Service time is from 1320 to 1440.    Dr. Fransico Him is Medical Director for Cardiac Rehab at [facility].

## 2021-03-12 ENCOUNTER — Encounter (HOSPITAL_COMMUNITY)
Admission: RE | Admit: 2021-03-12 | Discharge: 2021-03-12 | Disposition: A | Discharge status: Home / Self Care | Payer: Medicare Other | Source: Ambulatory Visit | Attending: Emergency Medicine | Admitting: Emergency Medicine

## 2021-03-12 ENCOUNTER — Other Ambulatory Visit: Payer: Self-pay

## 2021-03-12 DIAGNOSIS — Z87891 Personal history of nicotine dependence: Secondary | ICD-10-CM | POA: Diagnosis not present

## 2021-03-12 DIAGNOSIS — J449 Chronic obstructive pulmonary disease, unspecified: Secondary | ICD-10-CM

## 2021-03-12 NOTE — Progress Notes (Signed)
Daily Session Note  Patient Details  Name: [name] [name] MRN: [id number] Date of Birth: 06-Sep-1955 Referring Provider:   April Manson Pulmonary Rehab Walk Test from 02/16/2021 in [place]  Referring Provider Dr. Lamonte Sakai       Encounter Date: 03/12/2021  Check In:  Session Check In - 03/12/21 1343       Check-In   Supervising physician immediately available to respond to emergencies [facility] immediately available    Physician(s) Dr. Nevada Crane    Location [facility]    Staff Present Rosebud Poles, RN, Quentin Ore, MS, ACSM-CEP, Exercise Physiologist;Lisa Ysidro Evert, RN    Virtual Visit No    Medication changes reported     No    Fall or balance concerns reported    No    Tobacco Cessation No Change    Warm-up and Cool-down Performed as group-led instruction    Resistance Training Performed Yes    VAD Patient? No    PAD/SET Patient? No      Pain Assessment   Currently in Pain? No/denies    Multiple Pain Sites No             Capillary Blood Glucose: No results found for this or any previous visit (from the past 24 hour(s)).    Social History   Tobacco Use  Smoking Status Former   Packs/day: 0.50   Years: 40.00   Pack years: 20.00   Types: Cigarettes   Quit date: 03/17/2013   Years since quitting: 7.9  Smokeless Tobacco Never    Goals Met:  Proper associated with RPD/PD & O2 Sat Independence with exercise equipment Exercise tolerated well No report of concerns or symptoms today Strength training completed today  Goals Unmet:  Not Applicable  Comments: Service time is from 1320 to 1445.    Dr. Fransico Him is Medical Director for Cardiac Rehab at [facility].

## 2021-03-17 ENCOUNTER — Other Ambulatory Visit: Payer: Self-pay

## 2021-03-17 ENCOUNTER — Encounter (HOSPITAL_COMMUNITY)
Admission: RE | Admit: 2021-03-17 | Discharge: 2021-03-17 | Disposition: A | Discharge status: Home / Self Care | Payer: Medicare Other | Source: Ambulatory Visit | Attending: Emergency Medicine | Admitting: Emergency Medicine

## 2021-03-17 DIAGNOSIS — Z87891 Personal history of nicotine dependence: Secondary | ICD-10-CM | POA: Diagnosis not present

## 2021-03-17 DIAGNOSIS — J449 Chronic obstructive pulmonary disease, unspecified: Secondary | ICD-10-CM

## 2021-03-17 NOTE — Progress Notes (Signed)
Daily Session Note  Patient Details  Name: [name] [name] MRN: [id number] Date of Birth: 1956/06/02 Referring Provider:   April Manson Pulmonary Rehab Walk Test from 02/16/2021 in [place]  Referring Provider Dr. Lamonte Sakai       Encounter Date: 03/17/2021  Check In:  Session Check In - 03/17/21 1429       Check-In   Supervising physician immediately available to respond to emergencies [facility] immediately available    Physician(s) Dr. Nevada Crane    Location [facility]    Staff Present Rosebud Poles, RN, Quentin Ore, MS, ACSM-CEP, Exercise Physiologist;Tiron Suski Ysidro Evert, RN    Virtual Visit No    Medication changes reported     No    Fall or balance concerns reported    No    Tobacco Cessation No Change    Warm-up and Cool-down Performed as group-led instruction    Resistance Training Performed Yes    VAD Patient? No    PAD/SET Patient? No      Pain Assessment   Currently in Pain? No/denies    Multiple Pain Sites No             Capillary Blood Glucose: No results found for this or any previous visit (from the past 24 hour(s)).    Social History   Tobacco Use  Smoking Status Former   Packs/day: 0.50   Years: 40.00   Pack years: 20.00   Types: Cigarettes   Quit date: 03/17/2013   Years since quitting: 8.0  Smokeless Tobacco Never    Goals Met:  Exercise tolerated well No report of concerns or symptoms today Strength training completed today  Goals Unmet:  Not Applicable  Comments: Service time is from 1320 to 1435    Dr. Fransico Him is Medical Director for Cardiac Rehab at [facility].

## 2021-03-19 ENCOUNTER — Encounter (HOSPITAL_COMMUNITY)
Admission: RE | Admit: 2021-03-19 | Discharge: 2021-03-19 | Disposition: A | Discharge status: Home / Self Care | Payer: Medicare Other | Source: Ambulatory Visit | Attending: Emergency Medicine | Admitting: Emergency Medicine

## 2021-03-19 ENCOUNTER — Other Ambulatory Visit: Payer: Self-pay

## 2021-03-19 DIAGNOSIS — J449 Chronic obstructive pulmonary disease, unspecified: Secondary | ICD-10-CM

## 2021-03-19 DIAGNOSIS — Z87891 Personal history of nicotine dependence: Secondary | ICD-10-CM | POA: Diagnosis not present

## 2021-03-19 NOTE — Progress Notes (Signed)
Daily Session Note  Patient Details  Name: [name] [name] MRN: [id number] Date of Birth: 1955/09/16 Referring Provider:   April Manson Pulmonary Rehab Walk Test from 02/16/2021 in [place]  Referring Provider Dr. Lamonte Sakai       Encounter Date: 03/19/2021  Check In:  Session Check In - 03/19/21 1504       Check-In   Supervising physician immediately available to respond to emergencies [facility] immediately available    Physician(s) Dr. Nevada Crane    Location [facility]    Staff Present Rosebud Poles, RN, Quentin Ore, MS, ACSM-CEP, Exercise Physiologist;Lisa Ysidro Evert, RN    Virtual Visit No    Medication changes reported     No    Fall or balance concerns reported    No    Tobacco Cessation No Change    Warm-up and Cool-down Performed as group-led instruction    Resistance Training Performed Yes    VAD Patient? No    PAD/SET Patient? No      Pain Assessment   Currently in Pain? No/denies    Multiple Pain Sites No             Capillary Blood Glucose: No results found for this or any previous visit (from the past 24 hour(s)).    Social History   Tobacco Use  Smoking Status Former   Packs/day: 0.50   Years: 40.00   Pack years: 20.00   Types: Cigarettes   Quit date: 03/17/2013   Years since quitting: 8.0  Smokeless Tobacco Never    Goals Met:  Proper associated with RPD/PD & O2 Sat Independence with exercise equipment Exercise tolerated well No report of concerns or symptoms today Strength training completed today  Goals Unmet:  Not Applicable  Comments: Service time is from 1318 to 1438.    Dr. Fransico Him is Medical Director for Cardiac Rehab at [facility].

## 2021-03-24 ENCOUNTER — Encounter (HOSPITAL_COMMUNITY)
Admission: RE | Admit: 2021-03-24 | Discharge: 2021-03-24 | Disposition: A | Discharge status: Home / Self Care | Payer: Medicare Other | Source: Ambulatory Visit | Attending: Emergency Medicine | Admitting: Emergency Medicine

## 2021-03-24 ENCOUNTER — Other Ambulatory Visit: Payer: Self-pay

## 2021-03-24 VITALS — Wt 174.2 lb

## 2021-03-24 DIAGNOSIS — Z87891 Personal history of nicotine dependence: Secondary | ICD-10-CM | POA: Diagnosis not present

## 2021-03-24 DIAGNOSIS — J449 Chronic obstructive pulmonary disease, unspecified: Secondary | ICD-10-CM

## 2021-03-24 NOTE — Progress Notes (Signed)
Daily Session Note  Patient Details  Name: [name] [name] MRN: [id number] Date of Birth: 1955/09/08 Referring Provider:   April Manson Pulmonary Rehab Walk Test from 02/16/2021 in [place]  Referring Provider Dr. Lamonte Sakai       Encounter Date: 03/24/2021  Check In:  Session Check In - 03/24/21 1340       Check-In   Supervising physician immediately available to respond to emergencies [facility] immediately available    Physician(s) Dr. Verlon Au    Location [facility]    Staff Present Rosebud Poles, RN, Quentin Ore, MS, ACSM-CEP, Exercise Physiologist;Lisa Ysidro Evert, RN    Virtual Visit No    Medication changes reported     No    Fall or balance concerns reported    No    Tobacco Cessation No Change    Warm-up and Cool-down Performed as group-led instruction    Resistance Training Performed Yes    VAD Patient? No    PAD/SET Patient? No      Pain Assessment   Currently in Pain? No/denies    Multiple Pain Sites No             Capillary Blood Glucose: No results found for this or any previous visit (from the past 24 hour(s)).   Exercise Prescription Changes - 03/24/21 1400       Response to Exercise   Blood Pressure (Admit) 122/80    Blood Pressure (Exercise) 152/80    Blood Pressure (Exit) 130/70    Heart Rate (Admit) 85 bpm    Heart Rate (Exercise) 120 bpm    Heart Rate (Exit) 98 bpm    Oxygen Saturation (Admit) 97 %    Oxygen Saturation (Exercise) 95 %    Oxygen Saturation (Exit) 94 %    Rating of Perceived Exertion (Exercise) 11.5    Perceived Dyspnea (Exercise) 2    Duration Continue with 30 min of aerobic exercise without signs/symptoms of physical distress.    Intensity THRR unchanged      Resistance Training   Training Prescription Yes    Weight Red Bands    Reps 10-15    Time 10 Minutes      Treadmill   MPH 2.2    Grade 1    Minutes 15      NuStep   Level 4    SPM 80     Minutes 15    METs 2.7             Social History   Tobacco Use  Smoking Status Former   Packs/day: 0.50   Years: 40.00   Pack years: 20.00   Types: Cigarettes   Quit date: 03/17/2013   Years since quitting: 8.0  Smokeless Tobacco Never    Goals Met:  Proper associated with RPD/PD & O2 Sat Independence with exercise equipment Using PLB without cueing & demonstrates good technique Exercise tolerated well No report of concerns or symptoms today Strength training completed today  Goals Unmet:  Not Applicable  Comments: Service time is from 1320 to 1435.    Dr. Fransico Him is Medical Director for Cardiac Rehab at [facility].

## 2021-03-24 NOTE — Progress Notes (Signed)
Pulmonary Individual Treatment Plan  Patient Details  Name: [name] [name] MRN: [id number] Date of Birth: 1955-12-13 Referring Provider:   April Manson Pulmonary Rehab Walk Test from 02/16/2021 in [place]  Referring Provider Dr. 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 ([place])  Patient's Home Medications on Admission:   Current Outpatient Medications:    aspirin EC 81 MG tablet, Take 1 tablet (81 mg total) by mouth daily., Disp: 90 tablet, Rfl: 3   losartan (COZAAR) 50 MG tablet, Take 1 tablet (50 mg total) by mouth daily., Disp: 90 tablet, Rfl: 3   nitroGLYCERIN (NITROSTAT) 0.4 MG SL tablet, Place 1 tablet (0.4 mg total) under the tongue every 5 (five) minutes as needed for chest pain., Disp: 25 tablet, Rfl: 6   rosuvastatin (CRESTOR) 10 MG tablet, Take 10 mg by mouth daily., Disp: , Rfl:    VENTOLIN HFA 108 (90 Base) MCG/ACT inhaler, INHALE 1 PUFF BY MOUTH THREE TIMES DAILY AS NEEDED FOR WHEEZING OR SHORTNESS OF BREATH, Disp: 18 g, Rfl: 9   Vitamin D, Ergocalciferol, (DRISDOL) 50000 units CAPS capsule, Take 50,000 Units by mouth every 7 (seven) days., Disp: , Rfl:   Current Facility-Administered Medications:    Ampicillin-Sulbactam (UNASYN) 3 g in sodium chloride 0.9 % 100 mL IVPB, 3 g, Intravenous, Once, Avelina Laine, PA-C  Past Medical History: Past Medical History:  Diagnosis Date   Anxiety    husband also cancer pt   Cancer ([place])    lung   Complication of anesthesia    bronchospasms post-op (pulmonary did consult but pt was discharged later that day)  With last surg. she was given albuterol inhaler & had steroid with surg.     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, transient, takes Verapimil for migraine & BP control    PONV (postoperative nausea and vomiting)    PVC (premature ventricular contraction)    Shortness of breath dyspnea    with exertion   Thyroid nodule    benign by biopsy   Varicose veins    left leg   Visual disturbance    Episodic   Vitamin D deficiency    Wears glasses     Tobacco Use: Social History   Tobacco Use  Smoking Status Former   Packs/day: 0.50   Years: 40.00   Pack years: 20.00   Types: Cigarettes   Quit date: 03/17/2013   Years since quitting: 8.0  Smokeless Tobacco Never    Labs: Recent Review Flowsheet Data     Labs for ITP Cardiac and Pulmonary Rehab Latest Ref Rng & Units 07/24/2014 10/11/2017 04/30/2019 04/21/2020 11/14/2020   Cholestrol 100 - 199 mg/dL - 139 148 144 127   LDLCALC 0 - 99 mg/dL - 55 53 44 48   HDL >39 mg/dL - 73 82 87 68   Trlycerides  0 - 149 mg/dL - 54 64 60 46   PHART 7.350 - 7.450 7.424 - - - -   PCO2ART 35.0 - 45.0 mmHg 37.7 - - - -   HCO3 20.0 - 24.0 mEq/L 24.7(H) - - - -   TCO2 0 - 100 mmol/L 26 - - - -   ACIDBASEDEF 0.0 - 2.0 mmol/L - - - - -   O2SAT % 97.0 - - - -       Capillary Blood Glucose: Lab Results  Component Value Date   GLUCAP 119 (H) 04/23/2014     Pulmonary Assessment Scores:  Pulmonary Assessment Scores     Row Name 02/16/21 1206         ADL UCSD   ADL Phase Entry     SOB Score total 40           CAT Score   CAT Score 16             UCSD: Self-administered rating of dyspnea associated with activities of daily living (ADLs) 6-point scale (0 = "not at all" to 5 = "maximal or unable to do because of breathlessness")  Scoring Scores range from 0 to 120.   Minimally important difference is 5 units  CAT: CAT can identify the health impairment of COPD patients and is better correlated with disease progression.  CAT has a scoring range of zero to 40. The CAT score is classified into four groups of low (less than 10), medium (10 - 20), high (21-30) and very high (31-40) based on the impact level of disease on health status. A CAT score over 10 suggests significant symptoms.  A worsening CAT score could be explained by an exacerbation, poor medication adherence, poor inhaler technique, or progression of COPD or comorbid conditions.  CAT MCID is 2 points  mMRC: mMRC (Modified Medical Research Council) Dyspnea Scale is used to assess the degree of baseline functional disability in patients of respiratory disease due to dyspnea. No minimal important difference is established. A decrease in score of 1 point or greater is considered a positive change.   Pulmonary Function Assessment:  Pulmonary Function Assessment - 02/16/21 1158       Post Bronchodilator Spirometry Results   FEV1% 71 %    FEV1/FVC Ratio 63      Breath   Bilateral Breath Sounds Clear    Shortness of Breath Yes;Limiting activity             Exercise Target Goals: Exercise Program Goal: Individual exercise prescription set using results from initial 6 min walk test and THRR while considering  patient's activity barriers and safety.   Exercise Prescription Goal: Initial exercise prescription builds to 30-45 minutes a day of aerobic activity, 2-3 days per week.  Home exercise guidelines will be given to patient during program as part of exercise prescription that the participant will acknowledge.  Activity Barriers & Risk Stratification:  Activity Barriers & Cardiac Risk Stratification - 02/16/21 1143       Activity Barriers & Cardiac Risk Stratification   Activity Barriers Arthritis;Back Problems;Joint Problems;Deconditioning;Muscular Weakness;Shortness of Breath              6 Minute Walk:  6 Minute Walk     Row Name 02/16/21 1219         6 Minute Walk   Phase Initial     Distance 1600 feet     Walk Time 6 minutes     # of Rest Breaks  0     MPH 3.03     METS 3.71     RPE 11     Perceived Dyspnea  1     VO2 Peak 12.98     Symptoms No     Resting HR 80 bpm     Resting BP 124/78     Resting Oxygen Saturation  95 %     Exercise Oxygen Saturation  during 6 min walk 92 %     Max Ex. HR 123 bpm     Max Ex. BP 126/80     2 Minute Post BP 126/80           Interval HR   1 Minute HR 108     2 Minute HR 118     3 Minute HR 119     4 Minute HR 122     5 Minute HR 123     6 Minute HR 120     2 Minute Post HR 94     Interval Heart Rate? Yes           Interval Oxygen   Interval Oxygen? Yes     Baseline Oxygen Saturation % 95 %     1 Minute Oxygen Saturation % 95 %     1 Minute Liters of Oxygen 0 L     2 Minute Oxygen Saturation % 92 %     2 Minute Liters of Oxygen 0 L     3 Minute Oxygen Saturation % 94 %     3 Minute Liters of Oxygen 0 L     4 Minute Oxygen Saturation % 93 %     4 Minute Liters of Oxygen 0 L     5 Minute Oxygen Saturation % 93 %     5 Minute Liters of Oxygen 0 L     6 Minute Oxygen Saturation % 93 %     6 Minute Liters of Oxygen 0 L     2 Minute Post Oxygen Saturation % 97 %     2 Minute Post Liters of Oxygen 0 L              Oxygen Initial Assessment:  Oxygen Initial Assessment - 03/09/21 1045       Home Oxygen   Home Oxygen Device None    Sleep Oxygen Prescription None    Home Exercise Oxygen Prescription None    Home Resting Oxygen Prescription None      Initial 6 min Walk   Oxygen Used None      Program Oxygen Prescription   Program Oxygen Prescription None      Intervention   Short Term Goals To learn and understand importance of monitoring SPO2 with pulse oximeter and demonstrate accurate use of the pulse oximeter.;To learn and demonstrate proper pursed lip breathing techniques or other  breathing techniques. ;To learn and understand importance of maintaining oxygen saturations>88%;To learn and demonstrate proper use of respiratory medications    Long  Term Goals Maintenance of O2 saturations>88%;Verbalizes importance of monitoring SPO2 with pulse oximeter and return demonstration;Exhibits proper breathing techniques, such as pursed lip breathing or other method taught during program session;Compliance with respiratory medication;Demonstrates proper use of MDI's             Oxygen Re-Evaluation:  Oxygen Re-Evaluation     Row Name 03/09/21 1045             Goals/Expected Outcomes   Goals/Expected Outcomes Compliance and understanding  of oxygen saturation and breathing techniques to decrease shortness of breath.                Oxygen Discharge (Final Oxygen Re-Evaluation):  Oxygen Re-Evaluation - 03/09/21 1045       Goals/Expected Outcomes   Goals/Expected Outcomes Compliance and understanding of oxygen saturation and breathing techniques to decrease shortness of breath.             Initial Exercise Prescription:  Initial Exercise Prescription - 02/16/21 1200       Date of Initial Exercise RX and Referring Provider   Date 02/16/21    Referring Provider Dr. Lamonte Sakai    Expected Discharge Date 04/23/21      Treadmill   MPH 2    Grade 0    Minutes 15      NuStep   Level 2    SPM 80    Minutes 15      Prescription Details   Frequency (times per week) 2    Duration Progress to 30 minutes of continuous aerobic without signs/symptoms of physical distress      Intensity   THRR 40-80% of Max Heartrate 61-122    Ratings of Perceived Exertion 11-13    Perceived Dyspnea 0-4      Progression   Progression Continue to progress workloads to maintain intensity without signs/symptoms of physical distress.      Resistance Training   Training Prescription Yes    Weight Red bands    Reps 10-15             Perform Capillary Blood Glucose checks  as needed.  Exercise Prescription Changes:   Exercise Prescription Changes     Row Name 02/24/21 1400 03/10/21 1500 03/24/21 1400         Response to Exercise   Blood Pressure (Admit) 138/78 132/64 122/80     Blood Pressure (Exercise) 130/80 130/72 152/80     Blood Pressure (Exit) 140/80 124/80 130/70     Heart Rate (Admit) 76 bpm 107 bpm 85 bpm     Heart Rate (Exercise) 111 bpm 117 bpm 120 bpm     Heart Rate (Exit) 81 bpm 99 bpm 98 bpm     Oxygen Saturation (Admit) 95 % 97 % 97 %     Oxygen Saturation (Exercise) 95 % 95 % 95 %     Oxygen Saturation (Exit) 95 % 100 % 94 %     Rating of Perceived Exertion (Exercise) 11 12 11.5     Perceived Dyspnea (Exercise) 0.5 1.5 2     Duration Continue with 30 min of aerobic exercise without signs/symptoms of physical distress. Continue with 30 min of aerobic exercise without signs/symptoms of physical distress. Continue with 30 min of aerobic exercise without signs/symptoms of physical distress.     Intensity --  40-80% HRR THRR unchanged THRR unchanged           Resistance Training   Training Prescription Yes Yes Yes     Weight Red bands Red Bands Red Bands     Reps 10-15 10-15 10-15     Time 10 Minutes 10 Minutes 10 Minutes           Treadmill   MPH 2 2 2.2     Grade 0 0 1     Minutes 15 15 15            NuStep   Level 2 3 4      SPM 80 80 80  Minutes 15 15 15      METs 1.7 2.6 2.7              Exercise Comments:   Exercise Comments     Row Name 02/24/21 1513           Exercise Comments Kachina has completed her 1st day of exercise. She exercised on the Nustep at level 2 for 15 min and treadmill at 2.0@0 % for 15 min. She tolerated both stations well without complaints. Harlee also did the warmup and cooldown standing up without limitations. Will monitor and progress as able.                Exercise Goals and Review:   Exercise Goals     Row Name 02/16/21 1215 03/09/21 1040           Exercise Goals    Increase Physical Activity Yes Yes      Intervention Provide advice, education, support and counseling about physical activity/exercise needs.;Develop an individualized exercise prescription for aerobic and resistive training based on initial evaluation findings, risk stratification, comorbidities and participant's personal goals. Provide advice, education, support and counseling about physical activity/exercise needs.;Develop an individualized exercise prescription for aerobic and resistive training based on initial evaluation findings, risk stratification, comorbidities and participant's personal goals.      Expected Outcomes Short Term: Attend rehab on a regular basis to increase amount of physical activity.;Long Term: Add in home exercise to make exercise part of routine and to increase amount of physical activity.;Long Term: Exercising regularly at least 3-5 days a week. Short Term: Attend rehab on a regular basis to increase amount of physical activity.;Long Term: Add in home exercise to make exercise part of routine and to increase amount of physical activity.;Long Term: Exercising regularly at least 3-5 days a week.      Increase Strength and Stamina Yes Yes      Intervention Provide advice, education, support and counseling about physical activity/exercise needs.;Develop an individualized exercise prescription for aerobic and resistive training based on initial evaluation findings, risk stratification, comorbidities and participant's personal goals. Provide advice, education, support and counseling about physical activity/exercise needs.;Develop an individualized exercise prescription for aerobic and resistive training based on initial evaluation findings, risk stratification, comorbidities and participant's personal goals.      Expected Outcomes Short Term: Increase workloads from initial exercise prescription for resistance, speed, and METs.;Short Term: Perform resistance training exercises routinely  during rehab and add in resistance training at home;Long Term: Improve cardiorespiratory fitness, muscular endurance and strength as measured by increased METs and functional capacity (6MWT) Short Term: Increase workloads from initial exercise prescription for resistance, speed, and METs.;Short Term: Perform resistance training exercises routinely during rehab and add in resistance training at home;Long Term: Improve cardiorespiratory fitness, muscular endurance and strength as measured by increased METs and functional capacity (6MWT)      Able to understand and use rate of perceived exertion (RPE) scale Yes Yes      Intervention Provide education and explanation on how to use RPE scale Provide education and explanation on how to use RPE scale      Expected Outcomes Short Term: Able to use RPE daily in rehab to express subjective intensity level;Long Term:  Able to use RPE to guide intensity level when exercising independently Short Term: Able to use RPE daily in rehab to express subjective intensity level;Long Term:  Able to use RPE to guide intensity level when exercising independently      Able to  understand and use Dyspnea scale Yes Yes      Intervention Provide education and explanation on how to use Dyspnea scale Provide education and explanation on how to use Dyspnea scale      Expected Outcomes Short Term: Able to use Dyspnea scale daily in rehab to express subjective sense of shortness of breath during exertion;Long Term: Able to use Dyspnea scale to guide intensity level when exercising independently Short Term: Able to use Dyspnea scale daily in rehab to express subjective sense of shortness of breath during exertion;Long Term: Able to use Dyspnea scale to guide intensity level when exercising independently      Knowledge and understanding of Target Heart Rate Range (THRR) Yes Yes      Intervention Provide education and explanation of THRR including how the numbers were predicted and where they  are located for reference Provide education and explanation of THRR including how the numbers were predicted and where they are located for reference      Expected Outcomes Short Term: Able to state/look up THRR;Short Term: Able to use daily as guideline for intensity in rehab;Long Term: Able to use THRR to govern intensity when exercising independently Short Term: Able to state/look up THRR;Short Term: Able to use daily as guideline for intensity in rehab;Long Term: Able to use THRR to govern intensity when exercising independently      Understanding of Exercise Prescription Yes Yes      Intervention Provide education, explanation, and written materials on patient's individual exercise prescription Provide education, explanation, and written materials on patient's individual exercise prescription      Expected Outcomes Short Term: Able to explain program exercise prescription;Long Term: Able to explain home exercise prescription to exercise independently Short Term: Able to explain program exercise prescription;Long Term: Able to explain home exercise prescription to exercise independently               Exercise Goals Re-Evaluation :  Exercise Goals Re-Evaluation     Row Name 02/16/21 1216 03/09/21 1040           Exercise Goal Re-Evaluation   Exercise Goals Review Increase Physical Activity;Increase Strength and Stamina;Able to understand and use rate of perceived exertion (RPE) scale;Able to understand and use Dyspnea scale;Knowledge and understanding of Target Heart Rate Range (THRR);Understanding of Exercise Prescription Increase Physical Activity;Increase Strength and Stamina;Able to understand and use rate of perceived exertion (RPE) scale;Able to understand and use Dyspnea scale;Knowledge and understanding of Target Heart Rate Range (THRR);Understanding of Exercise Prescription      Comments Pt is scheduled to begin exercise 02/24/21. Will monitor and progress as able. Karmela has  completed 2 exercise sessions. She exercises on the Nustep for 15 min and the treadmill for 15 min. Her initial exercise prescription was too easy for her, so Jayle has been exercising at level 2 on the Nustep and 2.0 mph on the treadmill. She averages 2.5 METs on the Nustep and 2.53 METs on the treadmill. Sontee performs the warmup and cooldown standing without limitations. Zynia is motivated to exercise and improve her functional capacity. Will continue to monitor and progress as able.      Expected Outcomes Through exercise at rehab and home Safiyya will decrease shortness of breath with daily activities and feel confident in carrying out an exercise regimen at home. Through exercise at rehab and home Alysah will decrease shortness of breath with daily activities and feel confident in carrying out an exercise regimen at home.  Discharge Exercise Prescription (Final Exercise Prescription Changes):  Exercise Prescription Changes - 03/24/21 1400       Response to Exercise   Blood Pressure (Admit) 122/80    Blood Pressure (Exercise) 152/80    Blood Pressure (Exit) 130/70    Heart Rate (Admit) 85 bpm    Heart Rate (Exercise) 120 bpm    Heart Rate (Exit) 98 bpm    Oxygen Saturation (Admit) 97 %    Oxygen Saturation (Exercise) 95 %    Oxygen Saturation (Exit) 94 %    Rating of Perceived Exertion (Exercise) 11.5    Perceived Dyspnea (Exercise) 2    Duration Continue with 30 min of aerobic exercise without signs/symptoms of physical distress.    Intensity THRR unchanged      Resistance Training   Training Prescription Yes    Weight Red Bands    Reps 10-15    Time 10 Minutes      Treadmill   MPH 2.2    Grade 1    Minutes 15      NuStep   Level 4    SPM 80    Minutes 15    METs 2.7             Nutrition:  Target Goals: Understanding of nutrition guidelines, daily intake of sodium <1536m, cholesterol <2079m calories 30% from fat and 7% or less from saturated  fats, daily to have 5 or more servings of fruits and vegetables.  Biometrics:  Pre Biometrics - 02/16/21 1144       Pre Biometrics   Height 5' 5.5" (1.664 m)    Weight 77.6 kg    BMI (Calculated) 28.03    Grip Strength 25 kg              Nutrition Therapy Plan and Nutrition Goals:   Nutrition Assessments:  MEDIFICTS Score Key: ?70 Need to make dietary changes  40-70 Heart Healthy Diet ? 40 Therapeutic Level Cholesterol Diet   Picture Your Plate Scores: <4<68nhealthy dietary pattern with much room for improvement. 41-50 Dietary pattern unlikely to meet recommendations for good health and room for improvement. 51-60 More healthful dietary pattern, with some room for improvement.  >60 Healthy dietary pattern, although there may be some specific behaviors that could be improved.    Nutrition Goals Re-Evaluation:   Nutrition Goals Discharge (Final Nutrition Goals Re-Evaluation):   Psychosocial: Target Goals: Acknowledge presence or absence of significant depression and/or stress, maximize coping skills, provide positive support system. Participant is able to verbalize types and ability to use techniques and skills needed for reducing stress and depression.  Initial Review & Psychosocial Screening:  Initial Psych Review & Screening - 02/16/21 121207-03-24     Initial Review   Current issues with Current Stress Concerns;Current Sleep Concerns    Source of Stress Concerns Chronic Illness;Unable to perform yard/household activities;Unable to participate in former interests or hobbies    Comments Dionicia's husband died in 202018/03/24nd she struggles with sole ownership of household repairs with a [id number] old house.  Her daughter moved in with her, it has been a positive thing except for the 2 dogs and 4 cats.  She has trouble falling asleep and staying asleep and feels tired in the morning and is scheduled for a sleep study soon.      Family Dynamics   Good Support System? Yes    daughter is very supportive     Barriers   Psychosocial barriers to  participate in program Psychosocial barriers identified (see note)   will work with relaxation and stress techniques to see if they improve sleep patterns and handling of stress.     Screening Interventions   Interventions Encouraged to exercise    Expected Outcomes Long Term goal: The participant improves quality of Life and PHQ9 Scores as seen by post scores and/or verbalization of changes;Long Term Goal: Stressors or current issues are controlled or eliminated.;Short Term goal: Utilizing psychosocial counselor, staff and physician to assist with identification of specific Stressors or current issues interfering with healing process. Setting desired goal for each stressor or current issue identified.             Quality of Life Scores:  Scores of 19 and below usually indicate a poorer quality of life in these areas.  A difference of  2-3 points is a clinically meaningful difference.  A difference of 2-3 points in the total score of the Quality of Life Index has been associated with significant improvement in overall quality of life, self-image, physical symptoms, and general health in studies assessing change in quality of life.  PHQ-9: Recent Review Flowsheet Data     Depression screen [facility] 2/9 02/16/2021 07/10/2018   Decreased Interest 0 0   Down, Depressed, Hopeless 0 0   PHQ - 2 Score 0 0   Altered sleeping 3 0   Tired, decreased energy 0 1   Change in appetite 0 0   Feeling bad or failure about yourself  0 0   Trouble concentrating 0 0   Moving slowly or fidgety/restless 0 0   Suicidal thoughts 0 0   PHQ-9 Score - 1   Difficult doing work/chores Not difficult at all Not difficult at all      Interpretation of Total Score  Total Score Depression Severity:  1-4 = Minimal depression, 5-9 = Mild depression, 10-14 = Moderate depression, 15-19 = Moderately severe depression, 20-27 = Severe depression    Psychosocial Evaluation and Intervention:  Psychosocial Evaluation - 02/16/21 1222       Psychosocial Evaluation & Interventions   Interventions Encouraged to exercise with the program and follow exercise prescription;Stress management education;Relaxation education    Comments In hopes that exercise will improve quality of life, is having a sleep study to see if her sleep concerns are connected to sleep disorders.    Expected Outcomes For Chelby to sleep better and handle her stressors in healthy ways.    Continue Psychosocial Services  Follow up required by staff             Psychosocial Re-Evaluation:  Psychosocial Re-Evaluation     [place] Name 03/10/21 1505 03/10/21 1508           Psychosocial Re-Evaluation   Current issues with Current Sleep Concerns Current Stress Concerns;Current Sleep Concerns      Comments -- She has a an appointment with the Neurologist 04/06/21 to start her evaluation of fatigue and lack of sleep. She has not tried Melatonin due to fear of interactions with her medications, but is going to check with the pharmacist to see if it will be alright. She has a good support system with her daughter and has a positive attitude. She exhibits no signs of depression.      Expected Outcomes -- That she will manage her stress in a healthy way and that her sleep habits will improve.      Interventions -- Encouraged to attend Cardiac Rehabilitation for the exercise;Stress management  education;Relaxation education      Continue Psychosocial Services  -- Follow up required by staff             Initial Review   Source of Stress Concerns -- Chronic Illness;Unable to participate in former interests or hobbies;Unable to perform yard/household activities               Psychosocial Discharge (Final Psychosocial Re-Evaluation):  Psychosocial Re-Evaluation - 03/10/21 1508       Psychosocial Re-Evaluation   Current issues with Current Stress Concerns;Current Sleep  Concerns    Comments She has a an appointment with the Neurologist 04/06/21 to start her evaluation of fatigue and lack of sleep. She has not tried Melatonin due to fear of interactions with her medications, but is going to check with the pharmacist to see if it will be alright. She has a good support system with her daughter and has a positive attitude. She exhibits no signs of depression.    Expected Outcomes That she will manage her stress in a healthy way and that her sleep habits will improve.    Interventions Encouraged to attend Cardiac Rehabilitation for the exercise;Stress management education;Relaxation education    Continue Psychosocial Services  Follow up required by staff      Initial Review   Source of Stress Concerns Chronic Illness;Unable to participate in former interests or hobbies;Unable to perform yard/household activities             Education: Education Goals: Education classes will be provided on a weekly basis, covering required topics. Participant will state understanding/return demonstration of topics presented.  Learning Barriers/Preferences:  Learning Barriers/Preferences - 02/16/21 1224       Learning Barriers/Preferences   Learning Barriers None    Learning Preferences Audio;Computer/Internet;Group Instruction;Individual Instruction;Pictoral;Skilled Demonstration;Verbal Instruction;Written Material             Education Topics: Risk Factor Reduction:  -Group instruction that is supported by a PowerPoint presentation. Instructor discusses the definition of a risk factor, different risk factors for pulmonary disease, and how the heart and lungs work together.     Nutrition for Pulmonary Patient:  -Group instruction provided by PowerPoint slides, verbal discussion, and written materials to support subject matter. The instructor gives an explanation and review of healthy diet recommendations, which includes a discussion on weight management,  recommendations for fruit and vegetable consumption, as well as protein, fluid, caffeine, fiber, sodium, sugar, and alcohol. Tips for eating when patients are short of breath are discussed.   Pursed Lip Breathing:  -Group instruction that is supported by demonstration and informational handouts. Instructor discusses the benefits of pursed lip and diaphragmatic breathing and detailed demonstration on how to preform both.   Flowsheet Row PULMONARY REHAB CHRONIC OBSTRUCTIVE PULMONARY DISEASE from 03/12/2021 in [place]  Date 03/12/21  Educator Donnetta Simpers  [H/O]  Instruction Review Code 1- Verbalizes Understanding       Oxygen Safety:  -Group instruction provided by PowerPoint, verbal discussion, and written material to support subject matter. There is an overview of "What is Oxygen" and "Why do we need it".  Instructor also reviews how to create a safe environment for oxygen use, the importance of using oxygen as prescribed, and the risks of noncompliance. There is a brief discussion on traveling with oxygen and resources the patient may utilize.   Oxygen Equipment:  -Group instruction provided by [facility] Staff utilizing handouts, written materials, and equipment demonstrations. Flowsheet Row PULMONARY REHAB OTHER RESPIRATORY from  08/17/2018 in [place]  Date 07/20/18  Educator Ace Gins Rep  Instruction Review Code 1- Verbalizes Understanding       Signs and Symptoms:  -Group instruction provided by written material and verbal discussion to support subject matter. Warning signs and symptoms of infection, stroke, and heart attack are reviewed and when to call the physician/911 reinforced. Tips for preventing the spread of infection discussed. Flowsheet Row PULMONARY REHAB OTHER RESPIRATORY from 08/17/2018 in [place]  Date 08/03/18  Educator Remo Lipps  Instruction Review Code 1- Verbalizes  Understanding       Advanced Directives:  -Group instruction provided by verbal instruction and written material to support subject matter. Instructor reviews Advanced Directive laws and proper instruction for filling out document.   Pulmonary Video:  -Group video education that reviews the importance of medication and oxygen compliance, exercise, good nutrition, pulmonary hygiene, and pursed lip and diaphragmatic breathing for the pulmonary patient.   Exercise for the Pulmonary Patient:  -Group instruction that is supported by a PowerPoint presentation. Instructor discusses benefits of exercise, core components of exercise, frequency, duration, and intensity of an exercise routine, importance of utilizing pulse oximetry during exercise, safety while exercising, and options of places to exercise outside of rehab.   Flowsheet Row PULMONARY REHAB OTHER RESPIRATORY from 08/17/2018 in [place]  Date 07/27/18  Educator Cloyde Reams  Instruction Review Code 1- Verbalizes Understanding       Pulmonary Medications:  -Verbally interactive group education provided by instructor with focus on inhaled medications and proper administration.   Anatomy and Physiology of the Respiratory System and Intimacy:  -Group instruction provided by PowerPoint, verbal discussion, and written material to support subject matter. Instructor reviews respiratory cycle and anatomical components of the respiratory system and their functions. Instructor also reviews differences in obstructive and restrictive respiratory diseases with examples of each. Intimacy, Sex, and Sexuality differences are reviewed with a discussion on how relationships can change when diagnosed with pulmonary disease. Common sexual concerns are reviewed. Flowsheet Row PULMONARY REHAB OTHER RESPIRATORY from 08/17/2018 in [place]  Date 08/17/18  Educator  RN  Instruction Review Code 1-  Verbalizes Understanding       MD DAY -A group question and answer session with a medical doctor that allows participants to ask questions that relate to their pulmonary disease state.   OTHER EDUCATION -Group or individual verbal, written, or video instructions that support the educational goals of the pulmonary rehab program. [place] from 03/19/2021 in [place]  Date 03/19/21  Educator Donnetta Simpers  [METS]  Instruction Review Code 1- Verbalizes Understanding       Holiday Eating Survival Tips:  -Group instruction provided by PowerPoint slides, verbal discussion, and written materials to support subject matter. The instructor gives patients tips, tricks, and techniques to help them not only survive but enjoy the holidays despite the onslaught of food that accompanies the holidays.   Knowledge Questionnaire Score:  Knowledge Questionnaire Score - 02/16/21 1207       Knowledge Questionnaire Score   Pre Score 16/18             Core Components/Risk Factors/Patient Goals at Admission:  Personal Goals and Risk Factors at Admission - 02/16/21 1224       Core Components/Risk Factors/Patient Goals on Admission   Improve shortness of breath with ADL's Yes  Intervention Provide education, individualized exercise plan and daily activity instruction to help decrease symptoms of SOB with activities of daily living.    Expected Outcomes Short Term: Improve cardiorespiratory fitness to achieve a reduction of symptoms when performing ADLs;Long Term: Be able to perform more ADLs without symptoms or delay the onset of symptoms             Core Components/Risk Factors/Patient Goals Review:   Goals and Risk Factor Review     Row Name 02/16/21 1224 03/10/21 1525 03/10/21 1526         Core Components/Risk Factors/Patient Goals Review   Personal Goals Review Develop more efficient breathing  techniques such as purse lipped breathing and diaphragmatic breathing and practicing self-pacing with activity.;Increase knowledge of respiratory medications and ability to use respiratory devices properly.;Improve shortness of breath with ADL's;Stress;Other Improve shortness of breath with ADL's;Develop more efficient breathing techniques such as purse lipped breathing and diaphragmatic breathing and practicing self-pacing with activity.;Increase knowledge of respiratory medications and ability to use respiratory devices properly. Improve shortness of breath with ADL's;Develop more efficient breathing techniques such as purse lipped breathing and diaphragmatic breathing and practicing self-pacing with activity.;Increase knowledge of respiratory medications and ability to use respiratory devices properly.     Review -- -- She has attended 3 session in [place] and is progressing well. She has moved to level 3 on the nu step and METS up to 2.6, on the treadmill speed is 2.0 MPH.     Expected Outcomes -- -- see admission goals              Core Components/Risk Factors/Patient Goals at Discharge (Final Review):   Goals and Risk Factor Review - 03/10/21 1526       Core Components/Risk Factors/Patient Goals Review   Personal Goals Review Improve shortness of breath with ADL's;Develop more efficient breathing techniques such as purse lipped breathing and diaphragmatic breathing and practicing self-pacing with activity.;Increase knowledge of respiratory medications and ability to use respiratory devices properly.    Review She has attended 3 session in [place] and is progressing well. She has moved to level 3 on the nu step and METS up to 2.6, on the treadmill speed is 2.0 MPH.    Expected Outcomes see admission goals             ITP Comments:   Comments: ITP REVIEW Pt is making expected progress toward pulmonary rehab goals after completing 7 sessions. Recommend continued exercise, life style  modification, education, and utilization of breathing techniques to increase stamina and strength and decrease shortness of breath with exertion.

## 2021-03-26 ENCOUNTER — Other Ambulatory Visit: Payer: Self-pay

## 2021-03-26 ENCOUNTER — Encounter (HOSPITAL_COMMUNITY)
Admission: RE | Admit: 2021-03-26 | Discharge: 2021-03-26 | Disposition: A | Discharge status: Home / Self Care | Payer: Medicare Other | Source: Ambulatory Visit | Attending: Emergency Medicine | Admitting: Emergency Medicine

## 2021-03-26 DIAGNOSIS — J449 Chronic obstructive pulmonary disease, unspecified: Secondary | ICD-10-CM

## 2021-03-26 DIAGNOSIS — Z87891 Personal history of nicotine dependence: Secondary | ICD-10-CM | POA: Diagnosis not present

## 2021-03-26 NOTE — Progress Notes (Signed)
Daily Session Note  Patient Details  Name: [name] [name] MRN: [id number] Date of Birth: January 16, 1956 Referring Provider:   April Manson Pulmonary Rehab Walk Test from 02/16/2021 in [place]  Referring Provider Dr. Lamonte Sakai       Encounter Date: 03/26/2021  Check In:  Session Check In - 03/26/21 1336       Check-In   Supervising physician immediately available to respond to emergencies [facility] immediately available    Physician(s) Dr. Nevada Crane    Location [facility]    Staff Present Rosebud Poles, RN, Quentin Ore, MS, ACSM-CEP, Exercise Physiologist;Lisa Ysidro Evert, RN    Virtual Visit No    Medication changes reported     No    Fall or balance concerns reported    No    Tobacco Cessation No Change    Warm-up and Cool-down Performed as group-led instruction    Resistance Training Performed Yes    VAD Patient? No    PAD/SET Patient? No      Pain Assessment   Currently in Pain? No/denies    Multiple Pain Sites No             Capillary Blood Glucose: No results found for this or any previous visit (from the past 24 hour(s)).   Exercise Prescription Changes - 03/26/21 1400       Home Exercise Plan   Plans to continue exercise at Home (comment)   walks around neighborhood   Frequency Add 1 additional day to program exercise sessions.    Initial Home Exercises Provided 03/26/21             Social History   Tobacco Use  Smoking Status Former   Packs/day: 0.50   Years: 40.00   Pack years: 20.00   Types: Cigarettes   Quit date: 03/17/2013   Years since quitting: 8.0  Smokeless Tobacco Never    Goals Met:  Proper associated with RPD/PD & O2 Sat Independence with exercise equipment Exercise tolerated well No report of concerns or symptoms today Strength training completed today  Goals Unmet:  Not Applicable  Comments: Service time is from 1319 to 1428.    Dr. Rodman Pickle is the  pulmonary rehab medical director at [facility].

## 2021-03-26 NOTE — Progress Notes (Signed)
I have reviewed a Home Exercise Prescription with [name] [name]. [name] [name] is currently exercising at home. She is walking around the neighborhood 1-2 non-rehab days/wk for about 30 min/day. [name] [name] and I discussed how to progress their exercise prescription. The patient was advised to exercise 2-3 non-rehab days/wk for 30 min/day. [name] [name] wants to get into a regularly exercise routine once her dog is full trained. I encouraged [name] [name] to try doing resistance band exercises and flexibility training at home. I explained the benefits strength and flexibility training. The patient stated that their goals were to slow the progression of COPD. [name] [name] wants to try to keep the highest lung capacity possible. [name] [name] stated that they understand the exercise prescription.  We reviewed exercise guidelines, target heart rate during exercise, RPE Scale, weather conditions, endpoints for exercise, warmup and cool down. Patient is encouraged to come to me with any questions. I will continue to follow up with the patient to assist them with progression and safety.    Sheppard Plumber, MS, ACSM-CEP 03/26/2021 2:14 PM

## 2021-03-31 ENCOUNTER — Other Ambulatory Visit: Payer: Self-pay

## 2021-03-31 ENCOUNTER — Encounter (HOSPITAL_COMMUNITY)
Admission: RE | Admit: 2021-03-31 | Discharge: 2021-03-31 | Disposition: A | Discharge status: Home / Self Care | Payer: Medicare Other | Source: Ambulatory Visit | Attending: Emergency Medicine | Admitting: Emergency Medicine

## 2021-03-31 DIAGNOSIS — J449 Chronic obstructive pulmonary disease, unspecified: Secondary | ICD-10-CM

## 2021-03-31 DIAGNOSIS — Z87891 Personal history of nicotine dependence: Secondary | ICD-10-CM | POA: Diagnosis not present

## 2021-03-31 NOTE — Progress Notes (Signed)
Daily Session Note  Patient Details  Name: [name] [name] MRN: [id number] Date of Birth: [birth year] Referring Provider:   April Manson Pulmonary Rehab Walk Test from 02/16/2021 in [place]  Referring Provider Dr. Lamonte Sakai       Encounter Date: 03/31/2021  Check In:  Session Check In - 03/31/21 1431       Check-In   Supervising physician immediately available to respond to emergencies [facility] immediately available    Physician(s) Dr. Karleen Hampshire    [facility]    Staff Present Rosebud Poles, RN, Quentin Ore, MS, ACSM-CEP, Exercise Physiologist;Lisa Ysidro Evert, RN    Virtual Visit No    Medication changes reported     No    Fall or balance concerns reported    No    Tobacco Cessation No Change    Warm-up and Cool-down Performed as group-led instruction    Resistance Training Performed Yes    VAD Patient? No    PAD/SET Patient? No      Pain Assessment   Currently in Pain? No/denies    Multiple Pain Sites No             Capillary Blood Glucose: No results found for this or any previous visit (from the past 24 hour(s)).    Social History   Tobacco Use  Smoking Status Former   Packs/day: 0.50   Years: 40.00   Pack years: 20.00   Types: Cigarettes   Quit date: 03/17/2013   Years since quitting: 8.0  Smokeless Tobacco Never    Goals Met:  Proper associated with RPD/PD & O2 Sat Independence with exercise equipment Exercise tolerated well No report of concerns or symptoms today Strength training completed today  Goals Unmet:  Not Applicable  Comments: Service time is from 1329 to 1440.    Dr. Rodman Pickle is Medical Director for Pulmonary Rehab at [facility].

## 2021-04-02 ENCOUNTER — Encounter (HOSPITAL_COMMUNITY)
Admission: RE | Admit: 2021-04-02 | Discharge: 2021-04-02 | Disposition: A | Discharge status: Home / Self Care | Payer: Medicare Other | Source: Ambulatory Visit | Attending: Emergency Medicine | Admitting: Emergency Medicine

## 2021-04-02 ENCOUNTER — Other Ambulatory Visit: Payer: Self-pay

## 2021-04-02 VITALS — Wt 173.7 lb

## 2021-04-02 DIAGNOSIS — J449 Chronic obstructive pulmonary disease, unspecified: Secondary | ICD-10-CM

## 2021-04-02 DIAGNOSIS — Z87891 Personal history of nicotine dependence: Secondary | ICD-10-CM | POA: Diagnosis not present

## 2021-04-02 NOTE — Progress Notes (Signed)
Daily Session Note  Patient Details  Name: [name] [name] MRN: [id number] Date of Birth: Apr 14, 1956 Referring Provider:   April Manson Pulmonary Rehab Walk Test from 02/16/2021 in [place]  Referring Provider Dr. Lamonte Sakai       Encounter Date: 04/02/2021  Check In:  Session Check In - 04/02/21 1319       Check-In   Supervising physician immediately available to respond to emergencies [facility] immediately available    Physician(s) Dr.Samtani    [facility]    Staff Present Rosebud Poles, RN, Quentin Ore, MS, ACSM-CEP, Exercise Physiologist;Lisa Ysidro Evert, RN    Virtual Visit No    Medication changes reported     No    Fall or balance concerns reported    No    Tobacco Cessation No Change    Warm-up and Cool-down Performed as group-led instruction    Resistance Training Performed Yes    VAD Patient? No    PAD/SET Patient? No      Pain Assessment   Currently in Pain? No/denies    Multiple Pain Sites No             Capillary Blood Glucose: No results found for this or any previous visit (from the past 24 hour(s)).    Social History   Tobacco Use  Smoking Status Former   Packs/day: 0.50   Years: 40.00   Pack years: 20.00   Types: Cigarettes   Quit date: 03/17/2013   Years since quitting: 8.0  Smokeless Tobacco Never    Goals Met:  Proper associated with RPD/PD & O2 Sat Independence with exercise equipment Improved SOB with ADL's Exercise tolerated well No report of concerns or symptoms today Strength training completed today  Goals Unmet:  Not Applicable  Comments: Service time is from 1312 to  1415.   Dr. Rodman Pickle is Medical Director for Pulmonary Rehab at [facility].

## 2021-04-06 ENCOUNTER — Institutional Professional Consult (permissible substitution): Payer: Medicare Other | Admitting: Neurology

## 2021-04-06 DIAGNOSIS — Z23 Encounter for immunization: Secondary | ICD-10-CM | POA: Diagnosis not present

## 2021-04-07 ENCOUNTER — Encounter (HOSPITAL_COMMUNITY): Admission: RE | Admit: 2021-04-07 | Payer: Medicare Other | Source: Ambulatory Visit

## 2021-04-07 ENCOUNTER — Telehealth (HOSPITAL_COMMUNITY): Payer: Self-pay | Admitting: *Deleted

## 2021-04-07 NOTE — Telephone Encounter (Signed)
Patient left message on department voicemail. She will be absent from pulmonary rehab today taking care of some personal matters.

## 2021-04-09 ENCOUNTER — Other Ambulatory Visit: Payer: Self-pay

## 2021-04-09 ENCOUNTER — Encounter (HOSPITAL_COMMUNITY)
Admission: RE | Admit: 2021-04-09 | Discharge: 2021-04-09 | Disposition: A | Discharge status: Home / Self Care | Payer: Medicare Other | Source: Ambulatory Visit | Attending: Emergency Medicine | Admitting: Emergency Medicine

## 2021-04-09 DIAGNOSIS — J449 Chronic obstructive pulmonary disease, unspecified: Secondary | ICD-10-CM | POA: Diagnosis not present

## 2021-04-09 NOTE — Progress Notes (Signed)
Daily Session Note  Patient Details  Name: [name] [name] MRN: [id number] Date of Birth: 1956-01-31 Referring Provider:   April Manson Pulmonary Rehab Walk Test from 02/16/2021 in [place]  Referring Provider Dr. Lamonte Sakai       Encounter Date: 04/09/2021  Check In:  Session Check In - 04/09/21 1348       Check-In   Supervising physician immediately available to respond to emergencies [facility] immediately available    Physician(s) Dr. Tyrell Antonio    [facility]    Staff Present Rosebud Poles, RN, Quentin Ore, MS, ACSM-CEP, Exercise Physiologist    Virtual Visit No    Medication changes reported     No    Fall or balance concerns reported    No    Tobacco Cessation No Change    Warm-up and Cool-down Performed as group-led instruction    Resistance Training Performed Yes    VAD Patient? No    PAD/SET Patient? No      Pain Assessment   Currently in Pain? No/denies    Multiple Pain Sites No             Capillary Blood Glucose: No results found for this or any previous visit (from the past 24 hour(s)).    Social History   Tobacco Use  Smoking Status Former   Packs/day: 0.50   Years: 40.00   Pack years: 20.00   Types: Cigarettes   Quit date: 03/17/2013   Years since quitting: 8.0  Smokeless Tobacco Never    Goals Met:  Proper associated with RPD/PD & O2 Sat Independence with exercise equipment Exercise tolerated well No report of concerns or symptoms today Strength training completed today  Goals Unmet:  Not Applicable  Comments: Service time is from 1315 to 1434.    Dr. Rodman Pickle is Medical Director for Pulmonary Rehab at [facility].

## 2021-04-14 ENCOUNTER — Telehealth (HOSPITAL_COMMUNITY): Payer: Self-pay | Admitting: Internal Medicine

## 2021-04-14 ENCOUNTER — Encounter (HOSPITAL_COMMUNITY)
Admission: RE | Admit: 2021-04-14 | Discharge: 2021-04-14 | Disposition: A | Discharge status: Home / Self Care | Payer: Medicare Other | Source: Ambulatory Visit | Attending: Emergency Medicine | Admitting: Emergency Medicine

## 2021-04-14 DIAGNOSIS — J449 Chronic obstructive pulmonary disease, unspecified: Secondary | ICD-10-CM

## 2021-04-14 NOTE — Progress Notes (Signed)
Pulmonary Individual Treatment Plan  Patient Details  Name: [name] [name] MRN: [id number] Date of Birth: [birth year] Referring Provider:   April Manson Pulmonary Rehab Walk Test from 02/16/2021 in [facility]  Referring Provider Dr. Lamonte Sakai       Initial Encounter Date:  Flowsheet Row Pulmonary Rehab Walk Test from 02/16/2021 in [facility]  Date 02/16/21       Visit Diagnosis: Stage 2 moderate COPD by GOLD classification ([place])  Patient's Home Medications on Admission:   Current Outpatient Medications:    aspirin EC 81 MG tablet, Take 1 tablet (81 mg total) by mouth daily., Disp: 90 tablet, Rfl: 3   losartan (COZAAR) 50 MG tablet, Take 1 tablet (50 mg total) by mouth daily., Disp: 90 tablet, Rfl: 3   nitroGLYCERIN (NITROSTAT) 0.4 MG SL tablet, Place 1 tablet (0.4 mg total) under the tongue every 5 (five) minutes as needed for chest pain., Disp: 25 tablet, Rfl: 6   rosuvastatin (CRESTOR) 10 MG tablet, Take 10 mg by mouth daily., Disp: , Rfl:    VENTOLIN HFA 108 (90 Base) MCG/ACT inhaler, INHALE 1 PUFF BY MOUTH THREE TIMES DAILY AS NEEDED FOR WHEEZING OR SHORTNESS OF BREATH, Disp: 18 g, Rfl: 9   Vitamin D, Ergocalciferol, (DRISDOL) 50000 units CAPS capsule, Take 50,000 Units by mouth every 7 (seven) days., Disp: , Rfl:   Current Facility-Administered Medications:    Ampicillin-Sulbactam (UNASYN) 3 g in sodium chloride 0.9 % 100 mL IVPB, 3 g, Intravenous, Once, Avelina Laine, PA-C  Past Medical History: Past Medical History:  Diagnosis Date   Anxiety    husband also cancer pt   Cancer ([place])    lung   Complication of anesthesia    bronchospasms post-op (pulmonary did consult but pt was discharged later that day)  With last surg. she was given albuterol inhaler & had steroid with surg.     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, transient, takes Verapimil for migraine & BP control    PONV (postoperative nausea and vomiting)    PVC (premature ventricular contraction)    Shortness of breath dyspnea    with exertion   Thyroid nodule    benign by biopsy   Varicose veins    left leg   Visual disturbance    Episodic   Vitamin D deficiency    Wears glasses     Tobacco Use: Social History   Tobacco Use  Smoking Status Former   Packs/day: 0.50   Years: 40.00   Pack years: 20.00   Types: Cigarettes   Quit date: 03/17/2013   Years since quitting: 8.0  Smokeless Tobacco Never    Labs: Recent Review Flowsheet Data     Labs for ITP Cardiac and Pulmonary Rehab Latest Ref Rng & Units 07/24/2014 10/11/2017 04/30/2019 04/21/2020 11/14/2020   Cholestrol 100 - 199 mg/dL - 139 148 144 127   LDLCALC 0 - 99 mg/dL - 55 53 44 48   HDL >39 mg/dL - 73 82 87 68   Trlycerides  0 - 149 mg/dL - 54 64 60 46   PHART 7.350 - 7.450 7.424 - - - -   PCO2ART 35.0 - 45.0 mmHg 37.7 - - - -   HCO3 20.0 - 24.0 mEq/L 24.7(H) - - - -   TCO2 0 - 100 mmol/L 26 - - - -   ACIDBASEDEF 0.0 - 2.0 mmol/L - - - - -   O2SAT % 97.0 - - - -       Capillary Blood Glucose: Lab Results  Component Value Date   GLUCAP 119 (H) 04/23/2014     Pulmonary Assessment Scores:  Pulmonary Assessment Scores     Row Name 02/16/21 1206         ADL UCSD   ADL Phase Entry     SOB Score total 40       CAT Score   CAT Score 16             UCSD: Self-administered rating of dyspnea associated with activities of daily living (ADLs) 6-point scale (0 = "not at all" to 5 = "maximal or unable to do because of breathlessness")  Scoring Scores range from 0 to 120.  Minimally  important difference is 5 units  CAT: CAT can identify the health impairment of COPD patients and is better correlated with disease progression.  CAT has a scoring range of zero to 40. The CAT score is classified into four groups of low (less than 10), medium (10 - 20), high (21-30) and very high (31-40) based on the impact level of disease on health status. A CAT score over 10 suggests significant symptoms.  A worsening CAT score could be explained by an exacerbation, poor medication adherence, poor inhaler technique, or progression of COPD or comorbid conditions.  CAT MCID is 2 points  mMRC: mMRC (Modified Medical Research Council) Dyspnea Scale is used to assess the degree of baseline functional disability in patients of respiratory disease due to dyspnea. No minimal important difference is established. A decrease in score of 1 point or greater is considered a positive change.   Pulmonary Function Assessment:  Pulmonary Function Assessment - 02/16/21 1158       Post Bronchodilator Spirometry Results   FEV1% 71 %    FEV1/FVC Ratio 63      Breath   Bilateral Breath Sounds Clear    Shortness of Breath Yes;Limiting activity             Exercise Target Goals: Exercise Program Goal: Individual exercise prescription set using results from initial 6 min walk test and THRR while considering  patient's activity barriers and safety.   Exercise Prescription Goal: Initial exercise prescription builds to 30-45 minutes a day of aerobic activity, 2-3 days per week.  Home exercise guidelines will be given to patient during program as part of exercise prescription that the participant will acknowledge.  Activity Barriers & Risk Stratification:  Activity Barriers & Cardiac Risk Stratification - 02/16/21 1143       Activity Barriers & Cardiac Risk Stratification   Activity Barriers Arthritis;Back Problems;Joint Problems;Deconditioning;Muscular Weakness;Shortness of Breath              6 Minute Walk:  6 Minute Walk     Row Name 02/16/21 1219         6 Minute Walk   Phase Initial     Distance 1600 feet     Walk Time 6 minutes     # of Rest Breaks 0  MPH 3.03     METS 3.71     RPE 11     Perceived Dyspnea  1     VO2 Peak 12.98     Symptoms No     Resting HR 80 bpm     Resting BP 124/78     Resting Oxygen Saturation  95 %     Exercise Oxygen Saturation  during 6 min walk 92 %     Max Ex. HR 123 bpm     Max Ex. BP 126/80     2 Minute Post BP 126/80       Interval HR   1 Minute HR 108     2 Minute HR 118     3 Minute HR 119     4 Minute HR 122     5 Minute HR 123     6 Minute HR 120     2 Minute Post HR 94     Interval Heart Rate? Yes       Interval Oxygen   Interval Oxygen? Yes     Baseline Oxygen Saturation % 95 %     1 Minute Oxygen Saturation % 95 %     1 Minute Liters of Oxygen 0 L     2 Minute Oxygen Saturation % 92 %     2 Minute Liters of Oxygen 0 L     3 Minute Oxygen Saturation % 94 %     3 Minute Liters of Oxygen 0 L     4 Minute Oxygen Saturation % 93 %     4 Minute Liters of Oxygen 0 L     5 Minute Oxygen Saturation % 93 %     5 Minute Liters of Oxygen 0 L     6 Minute Oxygen Saturation % 93 %     6 Minute Liters of Oxygen 0 L     2 Minute Post Oxygen Saturation % 97 %     2 Minute Post Liters of Oxygen 0 L              Oxygen Initial Assessment:  Oxygen Initial Assessment - 03/09/21 1045       Home Oxygen   Home Oxygen Device None    Sleep Oxygen Prescription None    Home Exercise Oxygen Prescription None    Home Resting Oxygen Prescription None      Initial 6 min Walk   Oxygen Used None      Program Oxygen Prescription   Program Oxygen Prescription None      Intervention   Short Term Goals To learn and understand importance of monitoring SPO2 with pulse oximeter and demonstrate accurate use of the pulse oximeter.;To learn and demonstrate proper pursed lip breathing techniques or other breathing  techniques. ;To learn and understand importance of maintaining oxygen saturations>88%;To learn and demonstrate proper use of respiratory medications    Long  Term Goals Maintenance of O2 saturations>88%;Verbalizes importance of monitoring SPO2 with pulse oximeter and return demonstration;Exhibits proper breathing techniques, such as pursed lip breathing or other method taught during program session;Compliance with respiratory medication;Demonstrates proper use of MDI's             Oxygen Re-Evaluation:  Oxygen Re-Evaluation     Row Name 03/09/21 1045 04/07/21 0815           Program Oxygen Prescription   Program Oxygen Prescription -- None        Home Oxygen   Home  Oxygen Device -- None      Sleep Oxygen Prescription -- None      Home Exercise Oxygen Prescription -- None      Home Resting Oxygen Prescription -- None        Goals/Expected Outcomes   Short Term Goals -- To learn and understand importance of monitoring SPO2 with pulse oximeter and demonstrate accurate use of the pulse oximeter.;To learn and demonstrate proper pursed lip breathing techniques or other breathing techniques. ;To learn and understand importance of maintaining oxygen saturations>88%;To learn and demonstrate proper use of respiratory medications      Long  Term Goals -- Maintenance of O2 saturations>88%;Verbalizes importance of monitoring SPO2 with pulse oximeter and return demonstration;Exhibits proper breathing techniques, such as pursed lip breathing or other method taught during program session;Compliance with respiratory medication;Demonstrates proper use of MDI's      Goals/Expected Outcomes Compliance and understanding of oxygen saturation and breathing techniques to decrease shortness of breath. Compliance and understanding of oxygen saturation and breathing techniques to decrease shortness of breath.               Oxygen Discharge (Final Oxygen Re-Evaluation):  Oxygen Re-Evaluation - 04/07/21 0815        Program Oxygen Prescription   Program Oxygen Prescription None      Home Oxygen   Home Oxygen Device None    Sleep Oxygen Prescription None    Home Exercise Oxygen Prescription None    Home Resting Oxygen Prescription None      Goals/Expected Outcomes   Short Term Goals To learn and understand importance of monitoring SPO2 with pulse oximeter and demonstrate accurate use of the pulse oximeter.;To learn and demonstrate proper pursed lip breathing techniques or other breathing techniques. ;To learn and understand importance of maintaining oxygen saturations>88%;To learn and demonstrate proper use of respiratory medications    Long  Term Goals Maintenance of O2 saturations>88%;Verbalizes importance of monitoring SPO2 with pulse oximeter and return demonstration;Exhibits proper breathing techniques, such as pursed lip breathing or other method taught during program session;Compliance with respiratory medication;Demonstrates proper use of MDI's    Goals/Expected Outcomes Compliance and understanding of oxygen saturation and breathing techniques to decrease shortness of breath.             Initial Exercise Prescription:  Initial Exercise Prescription - 02/16/21 1200       Date of Initial Exercise RX and Referring Provider   Date 02/16/21    Referring Provider Dr. Lamonte Sakai    Expected Discharge Date 04/23/21      Treadmill   MPH 2    Grade 0    Minutes 15      NuStep   Level 2    SPM 80    Minutes 15      Prescription Details   Frequency (times per week) 2    Duration Progress to 30 minutes of continuous aerobic without signs/symptoms of physical distress      Intensity   THRR 40-80% of Max Heartrate 61-122    Ratings of Perceived Exertion 11-13    Perceived Dyspnea 0-4      Progression   Progression Continue to progress workloads to maintain intensity without signs/symptoms of physical distress.      Resistance Training   Training Prescription Yes    Weight Red  bands    Reps 10-15             Perform Capillary Blood Glucose checks as needed.  Exercise Prescription Changes:  Exercise Prescription Changes     Row Name 02/24/21 1400 03/10/21 1500 03/24/21 1400 03/26/21 1400 04/02/21 1345     Response to Exercise   Blood Pressure (Admit) 138/78 132/64 122/80 -- 128/68   Blood Pressure (Exercise) 130/80 130/72 152/80 -- --   Blood Pressure (Exit) 140/80 124/80 130/70 -- 122/66   Heart Rate (Admit) 76 bpm 107 bpm 85 bpm -- 84 bpm   Heart Rate (Exercise) 111 bpm 117 bpm 120 bpm -- 115 bpm   Heart Rate (Exit) 81 bpm 99 bpm 98 bpm -- 86 bpm   Oxygen Saturation (Admit) 95 % 97 % 97 % -- 98 %   Oxygen Saturation (Exercise) 95 % 95 % 95 % -- 96 %   Oxygen Saturation (Exit) 95 % 100 % 94 % -- 96 %   Rating of Perceived Exertion (Exercise) 11 12 11.5 -- 12   Perceived Dyspnea (Exercise) 0.5 1.5 2 -- 2   Duration Continue with 30 min of aerobic exercise without signs/symptoms of physical distress. Continue with 30 min of aerobic exercise without signs/symptoms of physical distress. Continue with 30 min of aerobic exercise without signs/symptoms of physical distress. -- Continue with 30 min of aerobic exercise without signs/symptoms of physical distress.   Intensity --  40-80% HRR THRR unchanged THRR unchanged -- THRR unchanged     Resistance Training   Training Prescription Yes Yes Yes -- Yes   Weight Red bands Red Bands Red Bands -- Red Bands   Reps 10-15 10-15 10-15 -- 10-15   Time 10 Minutes 10 Minutes 10 Minutes -- 10 Minutes     Treadmill   MPH 2 2 2.2 -- 2.4   Grade 0 0 1 -- 1   Minutes 15 15 15  -- 15     NuStep   Level 2 3 4  -- 5   SPM 80 80 80 -- 80   Minutes 15 15 15  -- 15   METs 1.7 2.6 2.7 -- 3.2     Home Exercise Plan   Plans to continue exercise at -- -- -- Home (comment)  walks around neighborhood --   Frequency -- -- -- Add 1 additional day to program exercise sessions. --   Initial Home Exercises Provided -- -- --  03/26/21 --            Exercise Comments:   Exercise Comments     Row Name 02/24/21 1513 03/26/21 1358         Exercise Comments Frona has completed her 1st day of exercise. She exercised on the Nustep at level 2 for 15 min and treadmill at 2.0@0 % for 15 min. She tolerated both stations well without complaints. Deah also did the warmup and cooldown standing up without limitations. Will monitor and progress as able. Completed home exercise plan. Nicci is currently walking outside around her neighborhood. She walks 1-2 non-rehab days/wk for about 30 min/day. She mentioned to she walks her dogs, so she is not consistently walking for 30 min. Alys hopes to walk her dog with her daughter for a constant 30 min/day more regularly once dog training is complete. Encouraged Bindi to add 1 additional non-rehab day/wk and to try performing resistance and flexibility training at home. Explained the benefits of muscular strength and flexibility training. Eileens seems motivated to exercise at home.               Exercise Goals and Review:   Exercise Goals     Row Name 02/16/21  1215 03/09/21 1040 04/07/21 0810         Exercise Goals   Increase Physical Activity Yes Yes Yes     Intervention Provide advice, education, support and counseling about physical activity/exercise needs.;Develop an individualized exercise prescription for aerobic and resistive training based on initial evaluation findings, risk stratification, comorbidities and participant's personal goals. Provide advice, education, support and counseling about physical activity/exercise needs.;Develop an individualized exercise prescription for aerobic and resistive training based on initial evaluation findings, risk stratification, comorbidities and participant's personal goals. Provide advice, education, support and counseling about physical activity/exercise needs.;Develop an individualized exercise prescription for aerobic and  resistive training based on initial evaluation findings, risk stratification, comorbidities and participant's personal goals.     Expected Outcomes Short Term: Attend rehab on a regular basis to increase amount of physical activity.;Long Term: Add in home exercise to make exercise part of routine and to increase amount of physical activity.;Long Term: Exercising regularly at least 3-5 days a week. Short Term: Attend rehab on a regular basis to increase amount of physical activity.;Long Term: Add in home exercise to make exercise part of routine and to increase amount of physical activity.;Long Term: Exercising regularly at least 3-5 days a week. Short Term: Attend rehab on a regular basis to increase amount of physical activity.;Long Term: Add in home exercise to make exercise part of routine and to increase amount of physical activity.;Long Term: Exercising regularly at least 3-5 days a week.     Increase Strength and Stamina Yes Yes Yes     Intervention Provide advice, education, support and counseling about physical activity/exercise needs.;Develop an individualized exercise prescription for aerobic and resistive training based on initial evaluation findings, risk stratification, comorbidities and participant's personal goals. Provide advice, education, support and counseling about physical activity/exercise needs.;Develop an individualized exercise prescription for aerobic and resistive training based on initial evaluation findings, risk stratification, comorbidities and participant's personal goals. Provide advice, education, support and counseling about physical activity/exercise needs.;Develop an individualized exercise prescription for aerobic and resistive training based on initial evaluation findings, risk stratification, comorbidities and participant's personal goals.     Expected Outcomes Short Term: Increase workloads from initial exercise prescription for resistance, speed, and METs.;Short Term:  Perform resistance training exercises routinely during rehab and add in resistance training at home;Long Term: Improve cardiorespiratory fitness, muscular endurance and strength as measured by increased METs and functional capacity (6MWT) Short Term: Increase workloads from initial exercise prescription for resistance, speed, and METs.;Short Term: Perform resistance training exercises routinely during rehab and add in resistance training at home;Long Term: Improve cardiorespiratory fitness, muscular endurance and strength as measured by increased METs and functional capacity (6MWT) Short Term: Increase workloads from initial exercise prescription for resistance, speed, and METs.;Short Term: Perform resistance training exercises routinely during rehab and add in resistance training at home;Long Term: Improve cardiorespiratory fitness, muscular endurance and strength as measured by increased METs and functional capacity (6MWT)     Able to understand and use rate of perceived exertion (RPE) scale Yes Yes Yes     Intervention Provide education and explanation on how to use RPE scale Provide education and explanation on how to use RPE scale Provide education and explanation on how to use RPE scale     Expected Outcomes Short Term: Able to use RPE daily in rehab to express subjective intensity level;Long Term:  Able to use RPE to guide intensity level when exercising independently Short Term: Able to use RPE daily in rehab to express subjective  intensity level;Long Term:  Able to use RPE to guide intensity level when exercising independently Short Term: Able to use RPE daily in rehab to express subjective intensity level;Long Term:  Able to use RPE to guide intensity level when exercising independently     Able to understand and use Dyspnea scale Yes Yes Yes     Intervention Provide education and explanation on how to use Dyspnea scale Provide education and explanation on how to use Dyspnea scale Provide education  and explanation on how to use Dyspnea scale     Expected Outcomes Short Term: Able to use Dyspnea scale daily in rehab to express subjective sense of shortness of breath during exertion;Long Term: Able to use Dyspnea scale to guide intensity level when exercising independently Short Term: Able to use Dyspnea scale daily in rehab to express subjective sense of shortness of breath during exertion;Long Term: Able to use Dyspnea scale to guide intensity level when exercising independently Short Term: Able to use Dyspnea scale daily in rehab to express subjective sense of shortness of breath during exertion;Long Term: Able to use Dyspnea scale to guide intensity level when exercising independently     Knowledge and understanding of Target Heart Rate Range (THRR) Yes Yes Yes     Intervention Provide education and explanation of THRR including how the numbers were predicted and where they are located for reference Provide education and explanation of THRR including how the numbers were predicted and where they are located for reference Provide education and explanation of THRR including how the numbers were predicted and where they are located for reference     Expected Outcomes Short Term: Able to state/look up THRR;Short Term: Able to use daily as guideline for intensity in rehab;Long Term: Able to use THRR to govern intensity when exercising independently Short Term: Able to state/look up THRR;Short Term: Able to use daily as guideline for intensity in rehab;Long Term: Able to use THRR to govern intensity when exercising independently Short Term: Able to state/look up THRR;Short Term: Able to use daily as guideline for intensity in rehab;Long Term: Able to use THRR to govern intensity when exercising independently     Understanding of Exercise Prescription Yes Yes Yes     Intervention Provide education, explanation, and written materials on patient's individual exercise prescription Provide education, explanation,  and written materials on patient's individual exercise prescription Provide education, explanation, and written materials on patient's individual exercise prescription     Expected Outcomes Short Term: Able to explain program exercise prescription;Long Term: Able to explain home exercise prescription to exercise independently Short Term: Able to explain program exercise prescription;Long Term: Able to explain home exercise prescription to exercise independently Short Term: Able to explain program exercise prescription;Long Term: Able to explain home exercise prescription to exercise independently              Exercise Goals Re-Evaluation :  Exercise Goals Re-Evaluation     Row Name 02/16/21 1216 03/09/21 1040 04/07/21 0810         Exercise Goal Re-Evaluation   Exercise Goals Review Increase Physical Activity;Increase Strength and Stamina;Able to understand and use rate of perceived exertion (RPE) scale;Able to understand and use Dyspnea scale;Knowledge and understanding of Target Heart Rate Range (THRR);Understanding of Exercise Prescription Increase Physical Activity;Increase Strength and Stamina;Able to understand and use rate of perceived exertion (RPE) scale;Able to understand and use Dyspnea scale;Knowledge and understanding of Target Heart Rate Range (THRR);Understanding of Exercise Prescription Increase Physical Activity;Increase Strength and Stamina;Able to  understand and use rate of perceived exertion (RPE) scale;Able to understand and use Dyspnea scale;Knowledge and understanding of Target Heart Rate Range (THRR);Understanding of Exercise Prescription     Comments Pt is scheduled to begin exercise 02/24/21. Will monitor and progress as able. Rucha has completed 2 exercise sessions. She exercises on the Nustep for 15 min and the treadmill for 15 min. Her initial exercise prescription was too easy for her, so Cortne has been exercising at level 2 on the Nustep and 2.0 mph on the treadmill.  She averages 2.5 METs on the Nustep and 2.53 METs on the treadmill. Malani performs the warmup and cooldown standing without limitations. Tomeika is motivated to exercise and improve her functional capacity. Will continue to monitor and progress as able. Jenney has completed 10 exercise sessions. She exercises on the Nustep for 15 min and the treadmill for 15 min. She averages 3.2 METs at level 5 on the Nustep and 3.17 METs on the treadmill. Shatyra has progressed for both exercise types and has tolerated the progressions well. We have discussed home exercise. Sharesa is walking around her neighborhood. She plans to increase her exercise once her dog is trained. Feven performs the warmup and cooldown standing without limitations. Britten is still very motivated to exercise and increase her functional capacity. Will continue to monitor and progress as able.     Expected Outcomes Through exercise at rehab and home Jesiah will decrease shortness of breath with daily activities and feel confident in carrying out an exercise regimen at home. Through exercise at rehab and home Shaneeka will decrease shortness of breath with daily activities and feel confident in carrying out an exercise regimen at home. Through exercise at rehab and home Mayelin will decrease shortness of breath with daily activities and feel confident in carrying out an exercise regimen at home.              Discharge Exercise Prescription (Final Exercise Prescription Changes):  Exercise Prescription Changes - 04/02/21 1345       Response to Exercise   Blood Pressure (Admit) 128/68    Blood Pressure (Exit) 122/66    Heart Rate (Admit) 84 bpm    Heart Rate (Exercise) 115 bpm    Heart Rate (Exit) 86 bpm    Oxygen Saturation (Admit) 98 %    Oxygen Saturation (Exercise) 96 %    Oxygen Saturation (Exit) 96 %    Rating of Perceived Exertion (Exercise) 12    Perceived Dyspnea (Exercise) 2    Duration Continue with 30 min of aerobic exercise  without signs/symptoms of physical distress.    Intensity THRR unchanged      Resistance Training   Training Prescription Yes    Weight Red Bands    Reps 10-15    Time 10 Minutes      Treadmill   MPH 2.4    Grade 1    Minutes 15      NuStep   Level 5    SPM 80    Minutes 15    METs 3.2             Nutrition:  Target Goals: Understanding of nutrition guidelines, daily intake of sodium <1531m, cholesterol <2060m calories 30% from fat and 7% or less from saturated fats, daily to have 5 or more servings of fruits and vegetables.  Biometrics:  Pre Biometrics - 02/16/21 1144       Pre Biometrics   Height 5' 5.5" (1.664 m)  Weight 77.6 kg    BMI (Calculated) 28.03    Grip Strength 25 kg              Nutrition Therapy Plan and Nutrition Goals:   Nutrition Assessments:  MEDIFICTS Score Key: ?70 Need to make dietary changes  40-70 Heart Healthy Diet ? 40 Therapeutic Level Cholesterol Diet   Picture Your Plate Scores: <94 Unhealthy dietary pattern with much room for improvement. 41-50 Dietary pattern unlikely to meet recommendations for good health and room for improvement. 51-60 More healthful dietary pattern, with some room for improvement.  >60 Healthy dietary pattern, although there may be some specific behaviors that could be improved.    Nutrition Goals Re-Evaluation:   Nutrition Goals Discharge (Final Nutrition Goals Re-Evaluation):   Psychosocial: Target Goals: Acknowledge presence or absence of significant depression and/or stress, maximize coping skills, provide positive support system. Participant is able to verbalize types and ability to use techniques and skills needed for reducing stress and depression.  Initial Review & Psychosocial Screening:  Initial Psych Review & Screening - 02/16/21 08/11/1205       Initial Review   Current issues with Current Stress Concerns;Current Sleep Concerns    Source of Stress Concerns Chronic  Illness;Unable to perform yard/household activities;Unable to participate in former interests or hobbies    Comments Latiqua's husband died in 2016-08-11 and she struggles with sole ownership of household repairs with a 53 year old house.  Her daughter moved in with her, it has been a positive thing except for the 2 dogs and 4 cats.  She has trouble falling asleep and staying asleep and feels tired in the morning and is scheduled for a sleep study soon.      Family Dynamics   Good Support System? Yes   daughter is very supportive     Barriers   Psychosocial barriers to participate in program Psychosocial barriers identified (see note)   will work with relaxation and stress techniques to see if they improve sleep patterns and handling of stress.     Screening Interventions   Interventions Encouraged to exercise    Expected Outcomes Long Term goal: The participant improves quality of Life and PHQ9 Scores as seen by post scores and/or verbalization of changes;Long Term Goal: Stressors or current issues are controlled or eliminated.;Short Term goal: Utilizing psychosocial counselor, staff and physician to assist with identification of specific Stressors or current issues interfering with healing process. Setting desired goal for each stressor or current issue identified.             Quality of Life Scores:  Scores of 19 and below usually indicate a poorer quality of life in these areas.  A difference of  2-3 points is a clinically meaningful difference.  A difference of 2-3 points in the total score of the Quality of Life Index has been associated with significant improvement in overall quality of life, self-image, physical symptoms, and general health in studies assessing change in quality of life.  PHQ-9: Recent Review Flowsheet Data     Depression screen [facility] 2/9 02/16/2021 07/10/2018   Decreased Interest 0 0   Down, Depressed, Hopeless 0 0   PHQ - 2 Score 0 0   Altered sleeping 3 0   Tired,  decreased energy 0 1   Change in appetite 0 0   Feeling bad or failure about yourself  0 0   Trouble concentrating 0 0   Moving slowly or fidgety/restless 0 0   Suicidal  thoughts 0 0   PHQ-9 Score - 1   Difficult doing work/chores Not difficult at all Not difficult at all      Interpretation of Total Score  Total Score Depression Severity:  1-4 = Minimal depression, 5-9 = Mild depression, 10-14 = Moderate depression, 15-19 = Moderately severe depression, 20-27 = Severe depression   Psychosocial Evaluation and Intervention:  Psychosocial Evaluation - 02/16/21 1222       Psychosocial Evaluation & Interventions   Interventions Encouraged to exercise with the program and follow exercise prescription;Stress management education;Relaxation education    Comments In hopes that exercise will improve quality of life, is having a sleep study to see if her sleep concerns are connected to sleep disorders.    Expected Outcomes For Kalima to sleep better and handle her stressors in healthy ways.    Continue Psychosocial Services  Follow up required by staff             Psychosocial Re-Evaluation:  Psychosocial Re-Evaluation     [place] Name 03/10/21 1505 03/10/21 1508 04/09/21 1423         Psychosocial Re-Evaluation   Current issues with Current Sleep Concerns Current Stress Concerns;Current Sleep Concerns Current Sleep Concerns     Comments -- She has a an appointment with the Neurologist 04/06/21 to start her evaluation of fatigue and lack of sleep. She has not tried Melatonin due to fear of interactions with her medications, but is going to check with the pharmacist to see if it will be alright. She has a good support system with her daughter and has a positive attitude. She exhibits no signs of depression. Kasondra was scheduled for a sleep study, but it is being rescheduled and the appt has not been made yet.  She had an appt with a physician to decide which sleep study she needs, but it was  cancelled and will be rescheduled.   She has a positive attitude and is enjoying her daughter living with her.  She has no transportation issues or financial issues, she has a great support group, no psychosocial issues identified at this time.     Expected Outcomes -- That she will manage her stress in a healthy way and that her sleep habits will improve. For Cyntia to continue to be free of psychosocial concerns and sleep study to be completed in the near future.     Interventions -- Encouraged to attend Cardiac Rehabilitation for the exercise;Stress management education;Relaxation education Encouraged to attend Pulmonary Rehabilitation for the exercise     Continue Psychosocial Services  -- Follow up required by staff Follow up required by staff       Initial Review   Source of Stress Concerns -- Chronic Illness;Unable to participate in former interests or hobbies;Unable to perform yard/household activities --              Psychosocial Discharge (Final Psychosocial Re-Evaluation):  Psychosocial Re-Evaluation - 04/09/21 1423       Psychosocial Re-Evaluation   Current issues with Current Sleep Concerns    Comments Orelia was scheduled for a sleep study, but it is being rescheduled and the appt has not been made yet.  She had an appt with a physician to decide which sleep study she needs, but it was cancelled and will be rescheduled.   She has a positive attitude and is enjoying her daughter living with her.  She has no transportation issues or financial issues, she has a great support group, no psychosocial issues  identified at this time.    Expected Outcomes For Amarise to continue to be free of psychosocial concerns and sleep study to be completed in the near future.    Interventions Encouraged to attend Pulmonary Rehabilitation for the exercise    Continue Psychosocial Services  Follow up required by staff             Education: Education Goals: Education classes will be provided  on a weekly basis, covering required topics. Participant will state understanding/return demonstration of topics presented.  Learning Barriers/Preferences:  Learning Barriers/Preferences - 02/16/21 1224       Learning Barriers/Preferences   Learning Barriers None    Learning Preferences Audio;Computer/Internet;Group Instruction;Individual Instruction;Pictoral;Skilled Demonstration;Verbal Instruction;Written Material             Education Topics: Risk Factor Reduction:  -Group instruction that is supported by a PowerPoint presentation. Instructor discusses the definition of a risk factor, different risk factors for pulmonary disease, and how the heart and lungs work together.     Nutrition for Pulmonary Patient:  -Group instruction provided by PowerPoint slides, verbal discussion, and written materials to support subject matter. The instructor gives an explanation and review of healthy diet recommendations, which includes a discussion on weight management, recommendations for fruit and vegetable consumption, as well as protein, fluid, caffeine, fiber, sodium, sugar, and alcohol. Tips for eating when patients are short of breath are discussed.   Pursed Lip Breathing:  -Group instruction that is supported by demonstration and informational handouts. Instructor discusses the benefits of pursed lip and diaphragmatic breathing and detailed demonstration on how to preform both.   Flowsheet Row PULMONARY REHAB CHRONIC OBSTRUCTIVE PULMONARY DISEASE from 03/12/2021 in [place]  Date 03/12/21  Educator Donnetta Simpers  [H/O]  Instruction Review Code 1- Verbalizes Understanding       Oxygen Safety:  -Group instruction provided by PowerPoint, verbal discussion, and written material to support subject matter. There is an overview of "What is Oxygen" and "Why do we need it".  Instructor also reviews how to create a safe environment for oxygen use, the importance of using  oxygen as prescribed, and the risks of noncompliance. There is a brief discussion on traveling with oxygen and resources the patient may utilize.   Oxygen Equipment:  -Group instruction provided by [facility] Staff utilizing handouts, written materials, and equipment demonstrations. Flowsheet Row PULMONARY REHAB OTHER RESPIRATORY from 08/17/2018 in [place]  Date 07/20/18  Educator Ace Gins Rep  Instruction Review Code 1- Verbalizes Understanding       Signs and Symptoms:  -Group instruction provided by written material and verbal discussion to support subject matter. Warning signs and symptoms of infection, stroke, and heart attack are reviewed and when to call the physician/911 reinforced. Tips for preventing the spread of infection discussed. Flowsheet Row PULMONARY REHAB OTHER RESPIRATORY from 08/17/2018 in [place]  Date 08/03/18  Educator Remo Lipps  Instruction Review Code 1- Verbalizes Understanding       Advanced Directives:  -Group instruction provided by verbal instruction and written material to support subject matter. Instructor reviews Advanced Directive laws and proper instruction for filling out document.   Pulmonary Video:  -Group video education that reviews the importance of medication and oxygen compliance, exercise, good nutrition, pulmonary hygiene, and pursed lip and diaphragmatic breathing for the pulmonary patient.   Exercise for the Pulmonary Patient:  -Group instruction that is supported by a PowerPoint presentation. Instructor discusses benefits of  exercise, core components of exercise, frequency, duration, and intensity of an exercise routine, importance of utilizing pulse oximetry during exercise, safety while exercising, and options of places to exercise outside of rehab.   Flowsheet Row PULMONARY REHAB OTHER RESPIRATORY from 08/17/2018 in [place]  Date  07/27/18  Educator Cloyde Reams  Instruction Review Code 1- Verbalizes Understanding       Pulmonary Medications:  -Verbally interactive group education provided by instructor with focus on inhaled medications and proper administration.   Anatomy and Physiology of the Respiratory System and Intimacy:  -Group instruction provided by PowerPoint, verbal discussion, and written material to support subject matter. Instructor reviews respiratory cycle and anatomical components of the respiratory system and their functions. Instructor also reviews differences in obstructive and restrictive respiratory diseases with examples of each. Intimacy, Sex, and Sexuality differences are reviewed with a discussion on how relationships can change when diagnosed with pulmonary disease. Common sexual concerns are reviewed. Flowsheet Row PULMONARY REHAB CHRONIC OBSTRUCTIVE PULMONARY DISEASE from 04/09/2021 in [place]  Date 04/09/21  Educator Donnetta Simpers  [Handout]  Instruction Review Code 1- Rosezena Sensor Understanding       MD DAY -A group question and answer session with a medical doctor that allows participants to ask questions that relate to their pulmonary disease state.   OTHER EDUCATION -Group or individual verbal, written, or video instructions that support the educational goals of the pulmonary rehab program. [place] from 03/19/2021 in [place]  Date 03/19/21  Educator Donnetta Simpers  [METS]  Instruction Review Code 1- Verbalizes Understanding       Holiday Eating Survival Tips:  -Group instruction provided by PowerPoint slides, verbal discussion, and written materials to support subject matter. The instructor gives patients tips, tricks, and techniques to help them not only survive but enjoy the holidays despite the onslaught of food that accompanies the holidays.   Knowledge  Questionnaire Score:  Knowledge Questionnaire Score - 02/16/21 1207       Knowledge Questionnaire Score   Pre Score 16/18             Core Components/Risk Factors/Patient Goals at Admission:  Personal Goals and Risk Factors at Admission - 02/16/21 1224       Core Components/Risk Factors/Patient Goals on Admission   Improve shortness of breath with ADL's Yes    Intervention Provide education, individualized exercise plan and daily activity instruction to help decrease symptoms of SOB with activities of daily living.    Expected Outcomes Short Term: Improve cardiorespiratory fitness to achieve a reduction of symptoms when performing ADLs;Long Term: Be able to perform more ADLs without symptoms or delay the onset of symptoms             Core Components/Risk Factors/Patient Goals Review:   Goals and Risk Factor Review     Row Name 02/16/21 1224 03/10/21 1525 03/10/21 1526 04/09/21 1602       Core Components/Risk Factors/Patient Goals Review   Personal Goals Review Develop more efficient breathing techniques such as purse lipped breathing and diaphragmatic breathing and practicing self-pacing with activity.;Increase knowledge of respiratory medications and ability to use respiratory devices properly.;Improve shortness of breath with ADL's;Stress;Other Improve shortness of breath with ADL's;Develop more efficient breathing techniques such as purse lipped breathing and diaphragmatic breathing and practicing self-pacing with activity.;Increase knowledge of respiratory medications and ability to use respiratory devices properly. Improve shortness of breath with ADL's;Develop more  efficient breathing techniques such as purse lipped breathing and diaphragmatic breathing and practicing self-pacing with activity.;Increase knowledge of respiratory medications and ability to use respiratory devices properly. Increase knowledge of respiratory medications and ability to use respiratory devices  properly.;Improve shortness of breath with ADL's;Develop more efficient breathing techniques such as purse lipped breathing and diaphragmatic breathing and practicing self-pacing with activity.    Review -- -- She has attended 3 session in [place] and is progressing well. She has moved to level 3 on the nu step and METS up to 2.6, on the treadmill speed is 2.0 MPH. Malayzia is making progress in pulmonary rehab, she is becoming stronger and will be exercising at the [facility] prep program after graduating from our program.She is exercising @ 3.1 mets on the nustep and 2.4 mph and 1% incline on the treadmill.    Expected Outcomes -- -- see admission goals See admission goals.             Core Components/Risk Factors/Patient Goals at Discharge (Final Review):   Goals and Risk Factor Review - 04/09/21 1602       Core Components/Risk Factors/Patient Goals Review   Personal Goals Review Increase knowledge of respiratory medications and ability to use respiratory devices properly.;Improve shortness of breath with ADL's;Develop more efficient breathing techniques such as purse lipped breathing and diaphragmatic breathing and practicing self-pacing with activity.    Review Giordana is making progress in pulmonary rehab, she is becoming stronger and will be exercising at the [facility] prep program after graduating from our program.She is exercising @ 3.1 mets on the nustep and 2.4 mph and 1% incline on the treadmill.    Expected Outcomes See admission goals.             ITP Comments:   Comments: ITP REVIEW Pt is making expected progress toward pulmonary rehab goals after completing 11 sessions. Recommend continued exercise, life style modification, education, and utilization of breathing techniques to increase stamina and strength and decrease shortness of breath with exertion.

## 2021-04-16 ENCOUNTER — Other Ambulatory Visit: Payer: Self-pay

## 2021-04-16 ENCOUNTER — Encounter (HOSPITAL_COMMUNITY)
Admission: RE | Admit: 2021-04-16 | Discharge: 2021-04-16 | Disposition: A | Discharge status: Home / Self Care | Payer: Medicare Other | Source: Ambulatory Visit | Attending: Emergency Medicine | Admitting: Emergency Medicine

## 2021-04-16 DIAGNOSIS — J449 Chronic obstructive pulmonary disease, unspecified: Secondary | ICD-10-CM

## 2021-04-16 NOTE — Progress Notes (Signed)
Daily Session Note  Patient Details  Name: [name] [name] MRN: [id number] Date of Birth: Apr 24, 1956 Referring Provider:   April Manson Pulmonary Rehab Walk Test from 02/16/2021 in [place]  Referring Provider Dr. Lamonte Sakai       Encounter Date: 04/16/2021  Check In:  Session Check In - 04/16/21 1421       Check-In   Supervising physician immediately available to respond to emergencies [facility] immediately available    Physician(s) Dr. Broadus John    Location [facility]    Staff Present Rosebud Poles, RN, Quentin Ore, MS, ACSM-CEP, Exercise Physiologist;Aleasha Fregeau Ysidro Evert, RN    Virtual Visit No    Medication changes reported     No    Fall or balance concerns reported    No    Tobacco Cessation No Change    Warm-up and Cool-down Performed as group-led instruction    Resistance Training Performed Yes    VAD Patient? No    PAD/SET Patient? No      Pain Assessment   Currently in Pain? No/denies    Multiple Pain Sites No             Capillary Blood Glucose: No results found for this or any previous visit (from the past 24 hour(s)).    Social History   Tobacco Use  Smoking Status Former   Packs/day: 0.50   Years: 40.00   Pack years: 20.00   Types: Cigarettes   Quit date: 03/17/2013   Years since quitting: 8.0  Smokeless Tobacco Never    Goals Met:  Exercise tolerated well No report of concerns or symptoms today Strength training completed today  Goals Unmet:  Not Applicable  Comments: Service time is from 1316 to [place]    Dr. Rodman Pickle is Medical Director for Pulmonary Rehab at [facility].

## 2021-04-21 ENCOUNTER — Other Ambulatory Visit: Payer: Self-pay

## 2021-04-21 ENCOUNTER — Encounter (HOSPITAL_COMMUNITY)
Admission: RE | Admit: 2021-04-21 | Discharge: 2021-04-21 | Disposition: A | Discharge status: Home / Self Care | Payer: Medicare Other | Source: Ambulatory Visit | Attending: Emergency Medicine | Admitting: Emergency Medicine

## 2021-04-21 VITALS — Wt 173.3 lb

## 2021-04-21 DIAGNOSIS — J449 Chronic obstructive pulmonary disease, unspecified: Secondary | ICD-10-CM | POA: Diagnosis not present

## 2021-04-21 NOTE — Progress Notes (Signed)
Daily Session Note  Patient Details  Name: [name] [name] MRN: [id number] Date of Birth: 11-28-1955 Referring Provider:   April Manson Pulmonary Rehab Walk Test from 02/16/2021 in [place]  Referring Provider Dr. Lamonte Sakai       Encounter Date: 04/21/2021  Check In:  Session Check In - 04/21/21 1426       Check-In   Supervising physician immediately available to respond to emergencies [facility] immediately available    Physician(s) Dr. Broadus John    Location [facility]    Staff Present Rosebud Poles, RN, Quentin Ore, MS, ACSM-CEP, Exercise Physiologist;Herson Prichard Ysidro Evert, RN    Virtual Visit No    Medication changes reported     No    Fall or balance concerns reported    No    Tobacco Cessation No Change    Warm-up and Cool-down Performed as group-led instruction    Resistance Training Performed Yes    VAD Patient? No    PAD/SET Patient? No      Pain Assessment   Currently in Pain? No/denies    Multiple Pain Sites No             Capillary Blood Glucose: No results found for this or any previous visit (from the past 24 hour(s)).   Exercise Prescription Changes - 04/21/21 1400       Response to Exercise   Blood Pressure (Admit) 128/64    Blood Pressure (Exercise) 130/80    Blood Pressure (Exit) 122/80    Heart Rate (Admit) 93 bpm    Heart Rate (Exercise) 125 bpm    Heart Rate (Exit) 102 bpm    Oxygen Saturation (Admit) 98 %    Oxygen Saturation (Exercise) 95 %    Oxygen Saturation (Exit) 96 %    Rating of Perceived Exertion (Exercise) 12    Perceived Dyspnea (Exercise) 2    Duration Continue with 30 min of aerobic exercise without signs/symptoms of physical distress.    Intensity THRR unchanged      Resistance Training   Training Prescription Yes    Weight Red bands    Reps 10-15    Time 10 Minutes      Treadmill   MPH 2.5    Grade 1    Minutes 15      NuStep   Level 5    SPM 80     Minutes 15    METs 2.5             Social History   Tobacco Use  Smoking Status Former   Packs/day: 0.50   Years: 40.00   Pack years: 20.00   Types: Cigarettes   Quit date: 03/17/2013   Years since quitting: 8.1  Smokeless Tobacco Never    Goals Met:  Exercise tolerated well No report of concerns or symptoms today Strength training completed today  Goals Unmet:  Not Applicable  Comments: Service time is from 1317 to [place]    Dr. Rodman Pickle is Medical Director for Pulmonary Rehab at [facility].

## 2021-04-23 ENCOUNTER — Other Ambulatory Visit: Payer: Self-pay

## 2021-04-23 ENCOUNTER — Encounter (HOSPITAL_COMMUNITY)
Admission: RE | Admit: 2021-04-23 | Discharge: 2021-04-23 | Disposition: A | Discharge status: Home / Self Care | Payer: Medicare Other | Source: Ambulatory Visit | Attending: Emergency Medicine | Admitting: Emergency Medicine

## 2021-04-23 DIAGNOSIS — J449 Chronic obstructive pulmonary disease, unspecified: Secondary | ICD-10-CM

## 2021-04-23 NOTE — Progress Notes (Signed)
Daily Session Note  Patient Details  Name: [name] [name] MRN: [id number] Date of Birth: February 08, 1956 Referring Provider:   April [name] Pulmonary Rehab Walk Test from 02/16/2021 in [place]  Referring Provider Dr. Lamonte [name]       Encounter Date: 04/23/2021  Check In:  Session Check In - 04/23/21 1416       Check-In   Supervising physician immediately available to respond to emergencies [facility] immediately available    Physician(s) Dr. Pietro [name]    Location [facility]    Staff Present [name] Poles, RN, [name] Ore, MS, ACSM-CEP, Exercise Physiologist;[name] Ysidro Evert, RN    Virtual Visit No    Medication changes reported     No    Fall or balance concerns reported    No    Tobacco Cessation No Change    Warm-up and Cool-down Performed as group-led instruction    Resistance Training Performed Yes    VAD Patient? No    PAD/SET Patient? No      Pain Assessment   Currently in Pain? No/denies    Multiple Pain Sites No             Capillary Blood Glucose: No results found for this or any previous visit (from the past 24 hour(s)).    Social History   Tobacco Use  Smoking Status Former   Packs/day: 0.50   Years: 40.00   Pack years: 20.00   Types: Cigarettes   Quit date: 03/17/2013   Years since quitting: 8.1  Smokeless Tobacco Never    Goals Met:  Proper associated with RPD/PD & O2 Sat Independence with exercise equipment Improved SOB with ADL's Using PLB without cueing & demonstrates good technique Exercise tolerated well No report of concerns or symptoms today Strength training completed today  Goals Unmet:  Not Applicable  Comments: Service time is from 1322 to 1440.  [name] [name] completed her graduation 6 minute walk test today.    Dr. Rodman [name] is Medical Director for Pulmonary Rehab at [facility].

## 2021-04-23 NOTE — Progress Notes (Signed)
Discharge Progress Report  Patient Details  Name: [name] [name] MRN: [id number] Date of Birth: [birth year] Referring Provider:   April [facility]on [facility] Walk Test from 02/16/2021 in [place]  Referring Provider Dr. Lamonte Sakai        Number of Visits: 14  Reason for Discharge:  Patient has met program and personal goals.  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 Lamonte Sakai    Expected Discharge Date 04/23/21      Treadmill   MPH 2    Grade 0    Minutes 15      NuStep   Level 2    SPM 80    Minutes 15      Prescription Details   Frequency (times per week) 2    Duration Progress to 30 minutes of continuous aerobic without signs/symptoms of physical distress      Intensity   THRR 40-80% of Max Heartrate 61-122    Ratings of Perceived Exertion 11-13    Perceived Dyspnea 0-4      Progression   Progression Continue to progress workloads to maintain intensity without signs/symptoms of physical distress.      Resistance Training   Training Prescription Yes    Weight Red bands    Reps 10-15             Discharge Exercise Prescription (Final Exercise Prescription Changes):  Exercise Prescription Changes - 04/21/21 1400       Response to Exercise   Blood  Pressure (Admit) 128/64    Blood Pressure (Exercise) 130/80    Blood Pressure (Exit) 122/80    Heart Rate (Admit) 93 bpm    Heart Rate (Exercise) 125 bpm    Heart Rate (Exit) 102 bpm    Oxygen Saturation (Admit) 98 %    Oxygen Saturation (Exercise) 95 %    Oxygen Saturation (Exit) 96 %    Rating of Perceived Exertion (Exercise) 12    Perceived Dyspnea (Exercise) 2    Duration Continue with 30 min of aerobic exercise without signs/symptoms of physical distress.    Intensity THRR unchanged      Resistance Training   Training Prescription Yes    Weight Red bands    Reps 10-15    Time 10 Minutes      Treadmill   MPH 2.5    Grade 1    Minutes 15      NuStep   Level 5    SPM 80    Minutes 15    METs 2.5  Functional Capacity:  6 Minute Walk     Row Name 02/16/21 1219 04/23/21 1518       6 Minute Walk   Phase Initial Discharge    Distance 1600 feet 1913 feet    Distance % Change -- 19.56 %    Distance Feet Change -- 313 ft    Walk Time 6 minutes 6 minutes    # of Rest Breaks 0 0    MPH 3.03 3.62    METS 3.71 4.66    RPE 11 12    Perceived Dyspnea  1 1.5    VO2 Peak 12.98 --    Symptoms No No    Resting HR 80 bpm 90 bpm    Resting BP 124/78 122/76    Resting Oxygen Saturation  95 % 98 %    Exercise Oxygen Saturation  during 6 min walk 92 % 93 %    Max Ex. HR 123 bpm 142 bpm    Max Ex. BP 126/80 150/82    2 Minute Post BP 126/80 130/80      Interval HR   1 Minute HR 108 118    2 Minute HR 118 128    3 Minute HR 119 133    4 Minute HR 122 135    5 Minute HR 123 142    6 Minute HR 120 140    2 Minute Post HR 94 100    Interval Heart Rate? Yes Yes      Interval Oxygen   Interval Oxygen? Yes Yes    Baseline Oxygen Saturation % 95 % 96 %    1 Minute Oxygen Saturation % 95 % 96 %    1 Minute Liters of Oxygen 0 L 0 L    2 Minute Oxygen Saturation % 92 % 93 %    2 Minute Liters of Oxygen 0 L 0 L    3 Minute Oxygen Saturation % 94 % 93 %    3  Minute Liters of Oxygen 0 L 0 L    4 Minute Oxygen Saturation % 93 % 93 %    4 Minute Liters of Oxygen 0 L 0 L    5 Minute Oxygen Saturation % 93 % 93 %    5 Minute Liters of Oxygen 0 L 0 L    6 Minute Oxygen Saturation % 93 % 93 %    6 Minute Liters of Oxygen 0 L 0 L    2 Minute Post Oxygen Saturation % 97 % 98 %    2 Minute Post Liters of Oxygen 0 L 0 L             Psychological, QOL, Others - Outcomes: PHQ 2/9: Depression screen [facility] 2/9 04/23/2021 02/16/2021 07/10/2018  Decreased Interest 0 0 0  Down, Depressed, Hopeless 0 0 0  PHQ - 2 Score 0 0 0  Altered sleeping 1 3 0  Tired, decreased energy 1 0 1  Change in appetite 0 0 0  Feeling bad or failure about yourself  0 0 0  Trouble concentrating 0 0 0  Moving slowly or fidgety/restless 0 0 0  Suicidal thoughts 0 0 0  PHQ-9 Score 2 - 1  Difficult doing work/chores Not difficult at all Not difficult at all Not difficult at all  Some recent data might be hidden    Quality of Life:   Personal Goals: Goals established at orientation with interventions provided to work toward goal.  Personal Goals and  Risk Factors at Admission - 02/16/21 1224       Core Components/Risk Factors/Patient Goals on Admission   Improve shortness of breath with ADL's Yes    Intervention Provide education, individualized exercise plan and daily activity instruction to help decrease symptoms of SOB with activities of daily living.    Expected Outcomes Short Term: Improve cardiorespiratory fitness to achieve a reduction of symptoms when performing ADLs;Long Term: Be able to perform more ADLs without symptoms or delay the onset of symptoms              Personal Goals Discharge:  Goals and Risk Factor Review     Row Name 02/16/21 1224 03/10/21 1525 03/10/21 1526 04/09/21 1602       Core Components/Risk Factors/Patient Goals Review   Personal Goals Review Develop more efficient breathing techniques such as purse lipped breathing and  diaphragmatic breathing and practicing self-pacing with activity.;Increase knowledge of respiratory medications and ability to use respiratory devices properly.;Improve shortness of breath with ADL's;Stress;Other Improve shortness of breath with ADL's;Develop more efficient breathing techniques such as purse lipped breathing and diaphragmatic breathing and practicing self-pacing with activity.;Increase knowledge of respiratory medications and ability to use respiratory devices properly. Improve shortness of breath with ADL's;Develop more efficient breathing techniques such as purse lipped breathing and diaphragmatic breathing and practicing self-pacing with activity.;Increase knowledge of respiratory medications and ability to use respiratory devices properly. Increase knowledge of respiratory medications and ability to use respiratory devices properly.;Improve shortness of breath with ADL's;Develop more efficient breathing techniques such as purse lipped breathing and diaphragmatic breathing and practicing self-pacing with activity.    Review -- -- She has attended 3 session in [place] and is progressing well. She has moved to level 3 on the nu step and METS up to 2.6, on the treadmill speed is 2.0 MPH. Zooey is making progress in pulmonary rehab, she is becoming stronger and will be exercising at the [facility] prep program after graduating from our program.She is exercising @ 3.1 mets on the nustep and 2.4 mph and 1% incline on the treadmill.    Expected Outcomes -- -- see admission goals See admission goals.             Exercise Goals and Review:  Exercise Goals     Row Name 02/16/21 1215 03/09/21 1040 04/07/21 0810         Exercise Goals   Increase Physical Activity Yes Yes Yes     Intervention Provide advice, education, support and counseling about physical activity/exercise needs.;Develop an individualized exercise prescription for aerobic and resistive training based on initial evaluation  findings, risk stratification, comorbidities and participant's personal goals. Provide advice, education, support and counseling about physical activity/exercise needs.;Develop an individualized exercise prescription for aerobic and resistive training based on initial evaluation findings, risk stratification, comorbidities and participant's personal goals. Provide advice, education, support and counseling about physical activity/exercise needs.;Develop an individualized exercise prescription for aerobic and resistive training based on initial evaluation findings, risk stratification, comorbidities and participant's personal goals.     Expected Outcomes Short Term: Attend rehab on a regular basis to increase amount of physical activity.;Long Term: Add in home exercise to make exercise part of routine and to increase amount of physical activity.;Long Term: Exercising regularly at least 3-5 days a week. Short Term: Attend rehab on a regular basis to increase amount of physical activity.;Long Term: Add in home exercise to make exercise part of routine and to increase amount of physical activity.;Long Term: Exercising regularly  at least 3-5 days a week. Short Term: Attend rehab on a regular basis to increase amount of physical activity.;Long Term: Add in home exercise to make exercise part of routine and to increase amount of physical activity.;Long Term: Exercising regularly at least 3-5 days a week.     Increase Strength and Stamina Yes Yes Yes     Intervention Provide advice, education, support and counseling about physical activity/exercise needs.;Develop an individualized exercise prescription for aerobic and resistive training based on initial evaluation findings, risk stratification, comorbidities and participant's personal goals. Provide advice, education, support and counseling about physical activity/exercise needs.;Develop an individualized exercise prescription for aerobic and resistive training based on  initial evaluation findings, risk stratification, comorbidities and participant's personal goals. Provide advice, education, support and counseling about physical activity/exercise needs.;Develop an individualized exercise prescription for aerobic and resistive training based on initial evaluation findings, risk stratification, comorbidities and participant's personal goals.     Expected Outcomes Short Term: Increase workloads from initial exercise prescription for resistance, speed, and METs.;Short Term: Perform resistance training exercises routinely during rehab and add in resistance training at home;Long Term: Improve cardiorespiratory fitness, muscular endurance and strength as measured by increased METs and functional capacity (6MWT) Short Term: Increase workloads from initial exercise prescription for resistance, speed, and METs.;Short Term: Perform resistance training exercises routinely during rehab and add in resistance training at home;Long Term: Improve cardiorespiratory fitness, muscular endurance and strength as measured by increased METs and functional capacity (6MWT) Short Term: Increase workloads from initial exercise prescription for resistance, speed, and METs.;Short Term: Perform resistance training exercises routinely during rehab and add in resistance training at home;Long Term: Improve cardiorespiratory fitness, muscular endurance and strength as measured by increased METs and functional capacity (6MWT)     Able to understand and use rate of perceived exertion (RPE) scale Yes Yes Yes     Intervention Provide education and explanation on how to use RPE scale Provide education and explanation on how to use RPE scale Provide education and explanation on how to use RPE scale     Expected Outcomes Short Term: Able to use RPE daily in rehab to express subjective intensity level;Long Term:  Able to use RPE to guide intensity level when exercising independently Short Term: Able to use RPE daily in  rehab to express subjective intensity level;Long Term:  Able to use RPE to guide intensity level when exercising independently Short Term: Able to use RPE daily in rehab to express subjective intensity level;Long Term:  Able to use RPE to guide intensity level when exercising independently     Able to understand and use Dyspnea scale Yes Yes Yes     Intervention Provide education and explanation on how to use Dyspnea scale Provide education and explanation on how to use Dyspnea scale Provide education and explanation on how to use Dyspnea scale     Expected Outcomes Short Term: Able to use Dyspnea scale daily in rehab to express subjective sense of shortness of breath during exertion;Long Term: Able to use Dyspnea scale to guide intensity level when exercising independently Short Term: Able to use Dyspnea scale daily in rehab to express subjective sense of shortness of breath during exertion;Long Term: Able to use Dyspnea scale to guide intensity level when exercising independently Short Term: Able to use Dyspnea scale daily in rehab to express subjective sense of shortness of breath during exertion;Long Term: Able to use Dyspnea scale to guide intensity level when exercising independently     Knowledge and understanding  of Target Heart Rate Range (THRR) Yes Yes Yes     Intervention Provide education and explanation of THRR including how the numbers were predicted and where they are located for reference Provide education and explanation of THRR including how the numbers were predicted and where they are located for reference Provide education and explanation of THRR including how the numbers were predicted and where they are located for reference     Expected Outcomes Short Term: Able to state/look up THRR;Short Term: Able to use daily as guideline for intensity in rehab;Long Term: Able to use THRR to govern intensity when exercising independently Short Term: Able to state/look up THRR;Short Term: Able to use  daily as guideline for intensity in rehab;Long Term: Able to use THRR to govern intensity when exercising independently Short Term: Able to state/look up THRR;Short Term: Able to use daily as guideline for intensity in rehab;Long Term: Able to use THRR to govern intensity when exercising independently     Understanding of Exercise Prescription Yes Yes Yes     Intervention Provide education, explanation, and written materials on patient's individual exercise prescription Provide education, explanation, and written materials on patient's individual exercise prescription Provide education, explanation, and written materials on patient's individual exercise prescription     Expected Outcomes Short Term: Able to explain program exercise prescription;Long Term: Able to explain home exercise prescription to exercise independently Short Term: Able to explain program exercise prescription;Long Term: Able to explain home exercise prescription to exercise independently Short Term: Able to explain program exercise prescription;Long Term: Able to explain home exercise prescription to exercise independently              Exercise Goals Re-Evaluation:  Exercise Goals Re-Evaluation     Row Name 02/16/21 1216 03/09/21 1040 04/07/21 0810         Exercise Goal Re-Evaluation   Exercise Goals Review Increase Physical Activity;Increase Strength and Stamina;Able to understand and use rate of perceived exertion (RPE) scale;Able to understand and use Dyspnea scale;Knowledge and understanding of Target Heart Rate Range (THRR);Understanding of Exercise Prescription Increase Physical Activity;Increase Strength and Stamina;Able to understand and use rate of perceived exertion (RPE) scale;Able to understand and use Dyspnea scale;Knowledge and understanding of Target Heart Rate Range (THRR);Understanding of Exercise Prescription Increase Physical Activity;Increase Strength and Stamina;Able to understand and use rate of perceived  exertion (RPE) scale;Able to understand and use Dyspnea scale;Knowledge and understanding of Target Heart Rate Range (THRR);Understanding of Exercise Prescription     Comments Pt is scheduled to begin exercise 02/24/21. Will monitor and progress as able. Austine has completed 2 exercise sessions. She exercises on the Nustep for 15 min and the treadmill for 15 min. Her initial exercise prescription was too easy for her, so Dakoda has been exercising at level 2 on the Nustep and 2.0 mph on the treadmill. She averages 2.5 METs on the Nustep and 2.53 METs on the treadmill. Abena performs the warmup and cooldown standing without limitations. Maritsa is motivated to exercise and improve her functional capacity. Will continue to monitor and progress as able. Cruz has completed 10 exercise sessions. She exercises on the Nustep for 15 min and the treadmill for 15 min. She averages 3.2 METs at level 5 on the Nustep and 3.17 METs on the treadmill. Tyffani has progressed for both exercise types and has tolerated the progressions well. We have discussed home exercise. Shakeerah is walking around her neighborhood. She plans to increase her exercise once her dog is trained. Norlene performs the  warmup and cooldown standing without limitations. Emmarae is still very motivated to exercise and increase her functional capacity. Will continue to monitor and progress as able.     Expected Outcomes Through exercise at rehab and home S[place]errica will decrease shortness of breath with daily activities and feel confident in carrying out an exercise regimen at home. Through exercise at rehab and home [place] will decrease shortness of breath with daily activities and feel confident in carrying out an exercise regimen at home. Through exercise at rehab and home Ariday will decrease shortness of breath with daily activities and feel confident in carrying out an exercise regimen at home.              Nutrition & Weight - Outcomes:  Pre  Biometrics - 02/16/21 1144       Pre Biometrics   Height 5' 5.5" (1.664 m)    Weight 77.6 kg    BMI (Calculated) 28.03    Grip Strength 25 kg              Nutrition:   Nutrition Discharge:   Education Questionnaire Score:  Knowledge Questionnaire Score - 02/16/21 1207       Knowledge Questionnaire Score   Pre Score 16/18             Goals reviewed with patient; copy given to patient.

## 2021-04-24 ENCOUNTER — Telehealth: Payer: Self-pay

## 2021-04-24 NOTE — Telephone Encounter (Signed)
Received return call, she most likely will start next December class at [facility] on 12/12, every M/W 11:30-12:45; will call to confirm first of December and set up assessment visit. If not, will contact after first of year for January class dates/times

## 2021-04-24 NOTE — Telephone Encounter (Signed)
Called to discuss PREP program, left voicemail

## 2021-05-06 ENCOUNTER — Telehealth: Payer: Self-pay

## 2021-05-06 NOTE — Telephone Encounter (Signed)
Called to set up Prep assessment for next week, left voicemail

## 2021-05-08 ENCOUNTER — Telehealth: Payer: Self-pay

## 2021-05-08 ENCOUNTER — Telehealth: Payer: Self-pay | Admitting: Interventional Cardiology

## 2021-05-08 NOTE — Telephone Encounter (Signed)
A user error has taken place: encounter opened in error, closed for administrative reasons.

## 2021-05-08 NOTE — Telephone Encounter (Signed)
Prep assessment visit scheduled for 12/12 at 10:45am; to begin PREP classes 12/12, every M/W 11:30-12:45

## 2021-05-18 ENCOUNTER — Telehealth: Payer: Self-pay

## 2021-05-18 DIAGNOSIS — Z961 Presence of intraocular lens: Secondary | ICD-10-CM | POA: Diagnosis not present

## 2021-05-18 DIAGNOSIS — H52203 Unspecified astigmatism, bilateral: Secondary | ICD-10-CM | POA: Diagnosis not present

## 2021-05-18 DIAGNOSIS — H18603 Keratoconus, unspecified, bilateral: Secondary | ICD-10-CM | POA: Diagnosis not present

## 2021-05-18 NOTE — Telephone Encounter (Signed)
Received call, she has GI upset; have rescheduled assessment visit for Wednesday 12/14 at 10:45 and will attend first class on the 14th instead of today.

## 2021-05-20 NOTE — Progress Notes (Signed)
[facility] PREP Evaluation  Patient Details  Name: [name] [name] MRN: [id number] Date of Birth: [birth year] Age: 65 y.o. PCP: [name], MD  Vitals:   05/20/21 1316  BP: (!) 142/82  Pulse: 94  SpO2: 97%  Weight: 171 lb 12.8 oz (77.9 kg)     [facility] Eval - 05/20/21 1300       [facility] "PREP" Location   [facility] "PREP" Location [facility]      Referral    Referring Provider Pulm Rehab    Reason for referral Hypertension;Inactivity;High Cholesterol;Other    Program Start Date 05/18/21      Measurement   Waist Circumference 35 inches    Hip Circumference 43 inches    Body fat 40.5 percent      Information for Trainer   Goals --   Preserve lung function, gain muscle strength, lose 10 pounds be end of program.   Current Exercise none    Orthopedic Concerns OA    Pertinent Medical History --   HTN, s/p lobectomy for lung CA, COPD   Current Barriers myself      Timed Up and Go (TUGS)   Timed Up and Go Low risk <9 seconds      Mobility and Daily Activities   I find it easy to walk up or down two or more flights of stairs. 1    I have no trouble taking out the trash. 4    I do housework such as vacuuming and dusting on my own without difficulty. 2    I can easily lift a gallon of milk (8lbs). 4    I can easily walk a mile. 3    I have no trouble reaching into high cupboards or reaching down to pick up something from the floor. 2    I do not have trouble doing out-door work such as Armed forces logistics/support/administrative officer, raking leaves, or gardening. 1      Mobility and Daily Activities   I feel younger than my age. 4    I feel independent. 4    I feel energetic. 2    I live an active life.  4    I feel strong. 2    I feel healthy. 4    I feel active as other people my age. 2      How fit and strong are you.   Fit and Strong Total Score 39            Past Medical History:  Diagnosis Date   Anxiety    husband also cancer pt   Cancer ([facility])    lung   Complication of anesthesia     bronchospasms post-op (pulmonary did consult but pt was discharged later that day)  With last surg. she was given albuterol inhaler & had steroid with surg.     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, transient, takes Verapimil for migraine & BP control    PONV (postoperative nausea and vomiting)    PVC (premature ventricular contraction)    Shortness of breath dyspnea  with exertion   Thyroid nodule    benign by biopsy   Varicose veins    left leg   Visual disturbance    Episodic   Vitamin D deficiency    Wears glasses    Past Surgical History:  Procedure Laterality Date   CARPAL TUNNEL RELEASE Left    CESAREAN SECTION     COLONOSCOPY W/ BIOPSIES AND POLYPECTOMY     DILATION AND CURETTAGE OF UTERUS     TONSILLECTOMY     VARICOSE VEIN SURGERY     left   VIDEO ASSISTED THORACOSCOPY (VATS)/ LOBECTOMY Right 07/23/2014   Procedure: VIDEO ASSISTED THORACOSCOPY (VATS)/ LOBECTOMY;  Surgeon: Ivin Poot, MD;  Location: [place];  Service: Thoracic;  Laterality: Right;   VIDEO BRONCHOSCOPY N/A 07/23/2014   Procedure: VIDEO BRONCHOSCOPY;  Surgeon: Ivin Poot, MD;  Location: [place];  Service: Thoracic;  Laterality: N/A;   VIDEO BRONCHOSCOPY WITH ENDOBRONCHIAL ULTRASOUND Right 04/17/2014   Procedure: VIDEO BRONCHOSCOPY WITH ENDOBRONCHIAL ULTRASOUND;  Surgeon: Collene Gobble, MD;  Location: [place];  Service: Thoracic;  Laterality: Right;   VIDEO BRONCHOSCOPY WITH ENDOBRONCHIAL ULTRASOUND N/A 07/08/2014   Procedure: VIDEO BRONCHOSCOPY WITH ENDOBRONCHIAL ULTRASOUND;  Surgeon: Ivin Poot, MD;  Location: [place] [facility];  Service: Thoracic;   Laterality: N/A;   Social History   Tobacco Use  Smoking Status Former   Packs/day: 0.50   Years: 40.00   Pack years: 20.00   Types: Cigarettes   Quit date: 03/17/2013   Years since quitting: 8.1  Smokeless Tobacco Never  Unable to attend first PREP class on 12/12; reviewed [place], tour of facility, fit testing done today. To attend PREP every M/W 11:30-12:45 at [facility].  Zacarias Pontes B Randie Tallarico 05/20/2021, 1:19 PM

## 2021-05-22 ENCOUNTER — Other Ambulatory Visit: Payer: Self-pay

## 2021-05-22 ENCOUNTER — Encounter: Payer: Self-pay | Admitting: Emergency Medicine

## 2021-05-22 ENCOUNTER — Ambulatory Visit (INDEPENDENT_AMBULATORY_CARE_PROVIDER_SITE_OTHER): Payer: Medicare Other | Admitting: Emergency Medicine

## 2021-05-22 DIAGNOSIS — J449 Chronic obstructive pulmonary disease, unspecified: Secondary | ICD-10-CM | POA: Diagnosis not present

## 2021-05-22 DIAGNOSIS — C3431 Malignant neoplasm of lower lobe, right bronchus or lung: Secondary | ICD-10-CM | POA: Diagnosis not present

## 2021-05-22 DIAGNOSIS — I25118 Atherosclerotic heart disease of native coronary artery with other forms of angina pectoris: Secondary | ICD-10-CM

## 2021-05-22 NOTE — Progress Notes (Signed)
Subjective:    Patient ID: [name] [name], female    DOB: [birth year], 65 y.o.   MRN: [id number]  COPD She complains of cough and shortness of breath. There is no wheezing. Pertinent negatives include no ear pain, fever, headaches, postnasal drip, rhinorrhea, sneezing, sore throat or trouble swallowing. Her past medical history is significant for COPD.   ROV 11/11/2020 --65 year old woman with COPD and a right lower lobectomy for adenocarcinoma treated with neoadjuvant chemo.  Also with a history of CAD.  We were managing her on Stiolto, but stopped it. She didn't seem to miss it.  Today she reports that her breathing has been fairly stable. She does still get exertional SOB with heavier activity. Most recently has not seen any benefit from her albuterol. Denies any wheezing or cough. She hasn't gone back to pulm rehab yet, isn't sure she wants to do so with a mask on - she is considering.   TTE 10/30/20 with EF 45-50% >> her verapamil was changed to losartan.   Most recent CT chest 02/05/2020 reviewed by me, shows decreased size of left axillary lymph node, stable mild right paratracheal mediastinal adenopathy, no suspicious nodules or masses.   ROV 05/22/21 --this is a follow-up visit for 65 year old woman with history of COPD, right lower lobe adenocarcinoma post lobectomy with neoadjuvant chemotherapy.  Also with a history of CAD.  Currently managed on albuterol as needed.  She is been on Stiolto in the past but did not miss it when we stopped it. She did pulmonary rehab again, finished it in November. May have gotten some benefit, not as much as last time. No desaturations noted. She is going to the [facility] now for exercise and wt loss. Hasn't used her albuterol in a year.   Most recent CT chest 02/12/2021 reviewed by me showed no evidence of recurrent disease.  Stable left axillary lymph node, not pathologically enlarged.  Planning for repeat in 1 year   Review of Systems  Constitutional:   Negative for fever and unexpected weight change.  HENT:  Negative for congestion, dental problem, ear pain, nosebleeds, postnasal drip, rhinorrhea, sinus pressure, sneezing, sore throat and trouble swallowing.   Eyes:  Negative for redness and itching.  Respiratory:  Positive for cough and shortness of breath. Negative for chest tightness and wheezing.   Cardiovascular:  Negative for palpitations and leg swelling.  Gastrointestinal:  Negative for nausea and vomiting.  Genitourinary:  Negative for dysuria.  Musculoskeletal:  Negative for joint swelling.  Skin:  Negative for rash.  Neurological:  Negative for headaches.  Hematological:  Does not bruise/bleed easily.  Psychiatric/Behavioral:  Negative for dysphoric mood. The patient is not nervous/anxious.       Objective:   Physical Exam Vitals:   05/22/21 1140  BP: (!) 142/84  Pulse: 85  Temp: 98.4 F (36.9 C)  TempSrc: Oral  SpO2: 95%  Weight: 174 lb (78.9 kg)  Height: 5' 5.5" (1.664 m)   Gen: Pleasant, well-nourished, in no distress,  normal affect  ENT: No lesions,  mouth clear,  oropharynx clear, no postnasal drip, no stridor  Lungs: No use of accessory muscles, no crackles, no active wheezing  Cardiovascular: RRR, heart sounds normal, no murmur or gallops, no peripheral edema  Musculoskeletal: No deformities, no cyanosis or clubbing  Neuro: alert, non focal  Skin: Warm, no lesions or rashes      Assessment & Plan:  Primary cancer of right lower lobe of lung Now 7 years out from  dx. on observation.  Planning for annual CT chest next September  COPD GOLD II B  Doing quite well and not on scheduled bronchodilator therapy.  Rarely uses albuterol.  She is interested in increasing her functional capacity and did pulmonary rehab, is now working at [employer].  She may decide to get a personal trainer going forward after she completes the [facility] program.  I did talk to her about trying her albuterol preexercise so that we can  make note of any improvement.  If she does have an improvement with bronchodilator it may be time for [place] to get her started on scheduled therapy.  Keep your albuterol available to use 2 puffs when you needed for shortness of breath, chest tightness, wheezing.  You might want to try using it about 15 minutes before your exercise to see if it impacts your breathing. Agree with continuing your exercise and conditioning plan Keep your flu and COVID vaccines up-to-date Follow with Dr. Lamonte Sakai in 12 months or sooner if you have any problems.   Baltazar Apo, MD, PhD 05/22/2021, 12:36 PM [facility] [phone] or if no answer [phone]

## 2021-05-22 NOTE — Assessment & Plan Note (Signed)
Now 7 years out from dx. on observation.  Planning for annual CT chest next September

## 2021-05-22 NOTE — Assessment & Plan Note (Signed)
Doing quite well and not on scheduled bronchodilator therapy.  Rarely uses albuterol.  She is interested in increasing her functional capacity and did pulmonary rehab, is now working at [employer].  She may decide to get a personal trainer going forward after she completes the [facility] program.  I did talk to her about trying her albuterol preexercise so that we can make note of any improvement.  If she does have an improvement with bronchodilator it may be time for [place] to get her started on scheduled therapy.  Keep your albuterol available to use 2 puffs when you needed for shortness of breath, chest tightness, wheezing.  You might want to try using it about 15 minutes before your exercise to see if it impacts your breathing. Agree with continuing your exercise and conditioning plan Keep your flu and COVID vaccines up-to-date Follow with Dr. Lamonte Sakai in 12 months or sooner if you have any problems.

## 2021-05-22 NOTE — Patient Instructions (Signed)
Keep your albuterol available to use 2 puffs when you needed for shortness of breath, chest tightness, wheezing.  You might want to try using it about 15 minutes before your exercise to see if it impacts your breathing. Agree with continuing your exercise and conditioning plan Keep your flu and COVID vaccines up-to-date Get your surveillance CT scan of the chest next year as planned by Dr. Julien Nordmann Follow with Dr. Lamonte Sakai in 12 months or sooner if you have any problems.

## 2021-05-25 DIAGNOSIS — H5362 Acquired night blindness: Secondary | ICD-10-CM | POA: Diagnosis not present

## 2021-05-25 NOTE — Progress Notes (Signed)
[facility] PREP Weekly Session  Patient Details  Name: [name] [name] MRN: [id number] Date of Birth: 30-Apr-1956 Age: 64 y.o. PCP: [name], MD  Vitals:   05/25/21 1301  Weight: 172 lb 9.6 oz (78.3 kg)     [facility] Weekly seesion - 05/25/21 1300       [facility] "PREP" Location   [facility] "PREP" Location [facility]      Weekly Session   Topic Discussed Importance of resistance training;Other ways to be active   150 minutes cardio/wk; strength training 2-3 times/wk for 20-40 minutes; sitting no longer than 30 minutes   Minutes exercised this week 150 minutes    Classes attended to date 2             [place] 05/25/2021, 1:03 PM

## 2021-06-08 NOTE — Progress Notes (Signed)
[facility] PREP Weekly Session  Patient Details  Name: [name] [name] MRN: [id number] Date of Birth: Feb 28, 1956 Age: 66 y.o. PCP: [name], MD  Vitals:   06/08/21 1455  Weight: 172 lb 12.8 oz (78.4 kg)     [facility] Weekly seesion - 06/08/21 1400       [facility] "PREP" Location   [facility] "PREP" Location [facility]      Weekly Session   Topic Discussed Healthy eating tips    Classes attended to date [place] 06/08/2021, 2:56 PM

## 2021-06-08 NOTE — Progress Notes (Signed)
Cardiology Office Note:    Date:  06/10/2021   ID:  [name] [name], DOB July 05, 1955, MRN [id number]  PCP:  [name] Hatchet, MD  Cardiologist:  [name] Grooms, MD   Referring MD: [name] Hatchet, MD   Chief Complaint  Patient presents with   Coronary Artery Disease   Congestive Heart Failure    History of Present Illness:    [name] [name] is a 66 y.o. female with a hx of asymptomatic CAD denoted by coronary calcification with mild obstructive RCA disease by Cor CTA 2020, hyperlipidemia, hypertension, Lung CA , chronic combined systolic and diastolic HF, and prior smoking history.   Still having dyspnea on exertion.  Pulmonary function is not changed significantly.  Including all data together, I feel that there is a component of dyspnea related to her heart.  We discontinue verapamil and start on losartan.  She is not on diuretic therapy.  Denies orthopnea and PND.  Past Medical History:  Diagnosis Date   Anxiety    husband also cancer pt   Cancer ([name] [name])    lung   Complication of anesthesia    bronchospasms post-op (pulmonary did consult but pt was discharged later that day)  With last surg. she was given albuterol inhaler & had steroid with surg.     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, transient, takes Verapimil for migraine & BP control    PONV (postoperative nausea and vomiting)    PVC (premature ventricular contraction)    Shortness of breath dyspnea    with exertion   Thyroid nodule    benign by biopsy   Varicose veins    left leg   Visual disturbance    Episodic   Vitamin D deficiency     Wears glasses     Past Surgical History:  Procedure Laterality Date   CARPAL TUNNEL RELEASE Left    CESAREAN SECTION     COLONOSCOPY W/ BIOPSIES AND POLYPECTOMY     DILATION AND CURETTAGE OF UTERUS     TONSILLECTOMY     VARICOSE VEIN SURGERY     left   VIDEO ASSISTED THORACOSCOPY (VATS)/ LOBECTOMY Right 07/23/2014   Procedure: VIDEO ASSISTED THORACOSCOPY (VATS)/ LOBECTOMY;  Surgeon: [name] Poot, MD;  Location: [place];  [name]: Thoracic;  Laterality: Right;   VIDEO BRONCHOSCOPY N/A 07/23/2014   Procedure: VIDEO BRONCHOSCOPY;  Surgeon: [name] Poot, MD;  Location: [place];  [name]: Thoracic;  Laterality: N/A;   VIDEO BRONCHOSCOPY WITH ENDOBRONCHIAL ULTRASOUND Right 04/17/2014   Procedure: VIDEO BRONCHOSCOPY WITH ENDOBRONCHIAL ULTRASOUND;  Surgeon: [name] Gobble, MD;  Location: [place];  [name]: Thoracic;  Laterality: Right;   VIDEO BRONCHOSCOPY WITH ENDOBRONCHIAL ULTRASOUND N/A 07/08/2014   Procedure: VIDEO BRONCHOSCOPY WITH ENDOBRONCHIAL ULTRASOUND;  Surgeon: [name] Poot, MD;  Location: [facility];  [name]: Thoracic;  Laterality: N/A;    Current Medications: Current Meds  Medication Sig   ALPRAZolam (XANAX) 0.5 MG tablet Take 0.5 mg by mouth as needed.   aspirin EC 81 MG tablet Take 1 tablet (81 mg total) by  mouth daily.   VENTOLIN HFA 108 (90 Base) MCG/ACT inhaler INHALE 1 PUFF BY MOUTH THREE TIMES DAILY AS NEEDED FOR WHEEZING OR SHORTNESS OF BREATH   Vitamin D, Ergocalciferol, (DRISDOL) 50000 units CAPS capsule Take 50,000 Units by mouth every 7 (seven) days.   [DISCONTINUED] losartan (COZAAR) 50 MG tablet Take 1 tablet (50 mg total) by mouth daily.   [DISCONTINUED] rosuvastatin (CRESTOR) 10 MG tablet Take 10 mg by mouth daily.   Current Facility-Administered Medications for the 06/10/21 encounter (Office Visit) with Belva Crome, MD  Medication   Ampicillin-Sulbactam (UNASYN) 3 g in sodium chloride 0.9 % 100 mL IVPB     Allergies:   Advil [ibuprofen], Codeine,  Hydrocodone-guaifenesin, Sulfa antibiotics, Temazepam, Plasticized base [plastibase], and Zofran [name] [name] hcl]   Social History   Socioeconomic History   Marital status: Widowed    Spouse name: Not on file   Number of children: 1   Years of education: 9   Highest education level: Not on file  Occupational History   Occupation: Nurse    Employer: [employer]  Tobacco Use   Smoking status: Former    Packs/day: 0.50    Years: 40.00    Pack years: 20.00    Types: Cigarettes    Quit date: 03/17/2013    Years since quitting: 8.2   Smokeless tobacco: Never  Vaping Use   Vaping Use: Never used  Substance and Sexual Activity   Alcohol use: Yes    Alcohol/week: 0.0 standard drinks    Comment: " rare"   Drug use: No   Sexual activity: Not on file  Other Topics Concern   Not on file  Social History Narrative   Lives alone. Husband passed away 10-02-2016   Caffeine use: 1-2 cups coffee per day   Right handed    Social Determinants of Health   Financial Resource Strain: Not on file  Food Insecurity: Not on file  Transportation Needs: Not on file  Physical Activity: Not on file  Stress: Not on file  Social Connections: Not on file     Family History: The patient's family history includes Atrial fibrillation in her father; Cancer in her mother; Diverticulitis in her brother and father; Headache in her sister; Hypertension in her brother, father, sister, and sister.  ROS:   Please see the history of present illness.    No cough.  Able to lie flat.  No peripheral edema.  Denies palpitations.  No episodes of syncope.  All other systems reviewed and are negative.  EKGs/Labs/Other Studies Reviewed:    The following studies were reviewed today:  Coronary CTA 2020: IMPRESSION: 1. Coronary calcium score of 206. This was 21 percentile for age and sex matched control.   2. Normal coronary origin with right dominance.   3. Mild disease noted in the proximal LAD and  moderate disease in proximal RCA. CAD-RADS 3.   4. Study will be sent for FFR. IMPRESSION: CT FFR analysis shows mid and distal RCA =.74 to .76. suggesting moderate stenosis in RCA noted on CTA is significant and consistent with ischemia.   ECHOCARDIOGRAM 10/2020: IMPRESSIONS     1. Left ventricular ejection fraction, by estimation, is 40 to 45%. The  left ventricle has mildly decreased function. The left ventricle  demonstrates global hypokinesis. Left ventricular diastolic parameters are  consistent with Grade I diastolic  dysfunction (impaired relaxation).   2. Right ventricular systolic function is mildly reduced. The right  ventricular size is normal. Tricuspid regurgitation signal  is inadequate  for assessing PA pressure.   3. The mitral valve is normal in structure. Mild mitral valve  regurgitation. No evidence of mitral stenosis.   4. The aortic valve is tricuspid. Aortic valve regurgitation is not  visualized. Mild aortic valve sclerosis is present, with no evidence of  aortic valve stenosis.   5. The inferior vena cava is normal in size with greater than 50%  respiratory variability, suggesting right atrial pressure of 3 mmHg.   EKG:  EKG is not repeated on today's exam  Recent Labs: 02/12/2021: ALT 14; BUN 14; Creatinine 0.86; Hemoglobin 14.2; Platelet Count 255; Potassium 4.7; Sodium 142  Recent Lipid Panel    Component Value Date/Time   CHOL 127 11/14/2020 1029   TRIG 46 11/14/2020 1029   HDL 68 11/14/2020 1029   CHOLHDL 1.9 11/14/2020 1029   LDLCALC 48 11/14/2020 1029    Physical Exam:    VS:  BP (!) 148/88    Pulse 84    Ht 5' 5.5" (1.664 m)    Wt 175 lb 9.6 oz (79.7 kg)    SpO2 95%    BMI 28.78 kg/m     Wt Readings from Last 3 Encounters:  06/10/21 175 lb 9.6 oz (79.7 kg)  06/08/21 172 lb 12.8 oz (78.4 kg)  05/25/21 172 lb 9.6 oz (78.3 kg)     GEN: Slightly overweight. No acute distress HEENT: Normal NECK: No JVD. LYMPHATICS: No  lymphadenopathy CARDIAC: No murmur. RRR positive S4 gallop, but no edema. VASCULAR:  Normal Pulses. No bruits. RESPIRATORY:  Clear to auscultation without rales, wheezing or rhonchi  ABDOMEN: Soft, non-tender, non-distended, No pulsatile mass, MUSCULOSKELETAL: No deformity  SKIN: Warm and dry NEUROLOGIC:  Alert and oriented x 3 PSYCHIATRIC:  Normal affect   ASSESSMENT:    1. Coronary artery disease of native artery of native heart with stable angina pectoris ([place])   2. Chronic combined systolic and diastolic heart failure ([place])   3. SOB (shortness of breath)   4. Hyperlipidemia with target LDL less than 70   5. Primary hypertension   6. Chronic obstructive pulmonary disease, unspecified COPD type ([place])   7. Hx of tobacco use, presenting hazards to health    PLAN:    In order of problems listed above:  Asymptomatic relative to severe ischemia.  Secondary prevention discussed. Start SGLT2 therapy in the form of Jardiance 10 mg/day.  Bmet in 2 weeks.  Follow-up in 6 weeks.  At that time, consider starting Entresto and discontinuing losartan.  She is concerned because she has only 1 kidney.  Kidney function is normal based upon laboratory data. Likely related to combined systolic and diastolic heart failure. Continue statin therapy Blood pressure is higher than I would like.  As we instituted additional areas of heart failure therapy the blood pressure will get better.  Adding Jardiance to losartan will help blood pressure. Unchanged according to most recent PFTs Stop smoking  Overall education and awareness concerning primary/secondary risk prevention was discussed in detail: LDL less than 70, hemoglobin A1c less than 7, blood pressure target less than 130/80 mmHg, >150 minutes of moderate aerobic activity per week, avoidance of smoking, weight control (via diet and exercise), and continued surveillance/management of/for obstructive sleep apnea.   Guideline directed therapy for left  ventricular systolic dysfunction: Angiotensin receptor-neprilysin inhibitor (ARNI)-Entresto; beta-blocker therapy - carvedilol, metoprolol succinate, or bisoprolol; mineralocorticoid receptor antagonist (MRA) therapy -spironolactone or eplerenone.  SGLT-2 agents -  Dapagliflozin [place]) or Empagliflozin (Jardiance).These therapies  have been shown to improve clinical outcomes including reduction of rehospitalization, survival, and acute heart failure.    Medication Adjustments/Labs and Tests Ordered: Current medicines are reviewed at length with the patient today.  Concerns regarding medicines are outlined above.  No orders of the defined types were placed in this encounter.  Meds ordered this encounter  Medications   rosuvastatin (CRESTOR) 10 MG tablet    Sig: Take 1 tablet (10 mg total) by mouth daily.    Dispense:  90 tablet    Refill:  3   nitroGLYCERIN (NITROSTAT) 0.4 MG SL tablet    Sig: Place 1 tablet (0.4 mg total) under the tongue every 5 (five) minutes as needed for chest pain.    Dispense:  25 tablet    Refill:  6   losartan (COZAAR) 50 MG tablet    Sig: Take 1 tablet (50 mg total) by mouth daily.    Dispense:  90 tablet    Refill:  3    D/c Verapamil    There are no Patient Instructions on file for this visit.   Signed, [name] Grooms, MD  06/10/2021 9:57 AM    [place]

## 2021-06-10 ENCOUNTER — Ambulatory Visit (INDEPENDENT_AMBULATORY_CARE_PROVIDER_SITE_OTHER): Payer: Medicare Other | Admitting: Interventional Cardiology

## 2021-06-10 ENCOUNTER — Other Ambulatory Visit: Payer: Self-pay

## 2021-06-10 ENCOUNTER — Encounter: Payer: Self-pay | Admitting: Interventional Cardiology

## 2021-06-10 VITALS — BP 148/88 | HR 84 | Ht 65.5 in | Wt 175.6 lb

## 2021-06-10 DIAGNOSIS — J449 Chronic obstructive pulmonary disease, unspecified: Secondary | ICD-10-CM

## 2021-06-10 DIAGNOSIS — Z87891 Personal history of nicotine dependence: Secondary | ICD-10-CM | POA: Diagnosis not present

## 2021-06-10 DIAGNOSIS — R0602 Shortness of breath: Secondary | ICD-10-CM

## 2021-06-10 DIAGNOSIS — I25118 Atherosclerotic heart disease of native coronary artery with other forms of angina pectoris: Secondary | ICD-10-CM | POA: Diagnosis not present

## 2021-06-10 DIAGNOSIS — I5042 Chronic combined systolic (congestive) and diastolic (congestive) heart failure: Secondary | ICD-10-CM

## 2021-06-10 DIAGNOSIS — E785 Hyperlipidemia, unspecified: Secondary | ICD-10-CM | POA: Diagnosis not present

## 2021-06-10 DIAGNOSIS — I10 Essential (primary) hypertension: Secondary | ICD-10-CM | POA: Diagnosis not present

## 2021-06-10 MED ORDER — ROSUVASTATIN CALCIUM 10 MG PO TABS: 10.0000 mg | ORAL_TABLET | Freq: Every day | ORAL | 3 refills | Status: DC

## 2021-06-10 MED ORDER — NITROGLYCERIN 0.4 MG SL SUBL: 0.4000 mg | SUBLINGUAL_TABLET | SUBLINGUAL | 6 refills | Status: DC | PRN

## 2021-06-10 MED ORDER — LOSARTAN POTASSIUM 50 MG PO TABS: 50.0000 mg | ORAL_TABLET | Freq: Every day | ORAL | 3 refills | Status: DC

## 2021-06-10 MED ORDER — EMPAGLIFLOZIN 10 MG PO TABS: 10.0000 mg | ORAL_TABLET | Freq: Every day | ORAL | 11 refills | Status: DC

## 2021-06-10 NOTE — Patient Instructions (Signed)
Medication Instructions:  1) START Jardiance 10mg  once dialy  *If you need a refill on your cardiac medications before your next appointment, please call your pharmacy*   Lab Work: BMET in 2 weeks  If you have labs (blood work) drawn today and your tests are completely normal, you will receive your results only by: [place] (if you have MyChart) OR A paper copy in the mail If you have any lab test that is abnormal or we need to change your treatment, we will call you to review the results.   Testing/Procedures: None   Follow-Up: At [facility], you and your health needs are our priority.  As part of our continuing mission to provide you with exceptional heart care, we have created designated Provider Care Teams.  These Care Teams include your primary Cardiologist (physician) and Advanced Practice Providers (APPs -  Physician Assistants and Nurse Practitioners) who all work together to provide you with the care you need, when you need it.  We recommend signing up for the patient portal called "MyChart".  Sign up information is provided on this After Visit Summary.  MyChart is used to connect with patients for Virtual Visits (Telemedicine).  Patients are able to view lab/test results, encounter notes, upcoming appointments, etc.  Non-urgent messages can be sent to your provider as well.   To learn more about what you can do with MyChart, go to [email or web address].    Your next appointment:   6 week(s)  The format for your next appointment:   In Person  Provider:   Sinclair Grooms, MD     Other Instructions

## 2021-06-15 NOTE — Progress Notes (Signed)
[facility] PREP Weekly Session  Patient Details  Name: [name] [name] MRN: [id number] Date of Birth: 1955-11-01 Age: 67 y.o. PCP: [name], MD  Vitals:   06/15/21 1305  Weight: 173 lb 6.4 oz (78.7 kg)     [facility] Weekly seesion - 06/15/21 1300       [facility] "PREP" Location   [facility] "PREP" Location [facility]      Weekly Session   Topic Discussed Health habits   sugar demo   Minutes exercised this week 80 minutes    Classes attended to date [place] 06/15/2021, 1:06 PM

## 2021-06-22 ENCOUNTER — Telehealth: Payer: Self-pay | Admitting: Interventional Cardiology

## 2021-06-22 NOTE — Telephone Encounter (Signed)
Spoke with the patient who states that she has not started on Jardiance yet. She states that she does not want to start it at this time. She states that she would prefer to work on her diet and exercise before starting on a new medication at this time. She states that she is going to work on cutting out salt in her diet and is going to monitor her BP closely. She states that she already has a problem with urinating frequently and does not want this to worsen with Jardiance. She would like to know if this is okay with Dr. Tamala Julian. If so she wants to know if she still needs to follow up with him in February or if she can wait a few months.

## 2021-06-22 NOTE — Telephone Encounter (Signed)
Pt c/o medication issue:  1. Name of Medication:  empagliflozin (JARDIANCE) 10 MG TABS tablet  2. How are you currently taking this medication (dosage and times per day)? Not currently taking   3. Are you having a reaction (difficulty breathing--STAT)? No   4. What is your medication issue? Patient is calling stating she never started this medication. She is wanting to know if she can try losing weight with exercise without starting this medication first. She feels she would need to urinate too much starting this medication and reports she already gets up 5x through the night. [place] Dr. Tamala Julian is in agreement with this plan she would like to know when she should follow up. Please advise.

## 2021-06-24 ENCOUNTER — Ambulatory Visit (INDEPENDENT_AMBULATORY_CARE_PROVIDER_SITE_OTHER): Payer: Medicare Other | Admitting: Neurology

## 2021-06-24 ENCOUNTER — Encounter: Payer: Self-pay | Admitting: Neurology

## 2021-06-24 ENCOUNTER — Other Ambulatory Visit: Payer: Self-pay

## 2021-06-24 ENCOUNTER — Telehealth: Payer: Self-pay | Admitting: Neurology

## 2021-06-24 VITALS — BP 154/81 | HR 82 | Ht 65.5 in | Wt 173.5 lb

## 2021-06-24 DIAGNOSIS — F5104 Psychophysiologic insomnia: Secondary | ICD-10-CM

## 2021-06-24 DIAGNOSIS — G3184 Mild cognitive impairment, so stated: Secondary | ICD-10-CM | POA: Diagnosis not present

## 2021-06-24 DIAGNOSIS — I493 Ventricular premature depolarization: Secondary | ICD-10-CM

## 2021-06-24 DIAGNOSIS — C3431 Malignant neoplasm of lower lobe, right bronchus or lung: Secondary | ICD-10-CM | POA: Diagnosis not present

## 2021-06-24 DIAGNOSIS — R351 Nocturia: Secondary | ICD-10-CM

## 2021-06-24 DIAGNOSIS — G43109 Migraine with aura, not intractable, without status migrainosus: Secondary | ICD-10-CM

## 2021-06-24 MED ORDER — TRAZODONE HCL 50 MG PO TABS: 50.0000 mg | ORAL_TABLET | Freq: Every evening | ORAL | 5 refills | Status: DC | PRN

## 2021-06-24 NOTE — Patient Instructions (Addendum)
Mild Neurocognitive Disorder Mild neurocognitive disorder, formerly known as mild cognitive impairment, is a disorder in which memory does not work as well as it should. This disorder may also cause problems with other mental functions, including thought, communication, behavior, and completion of tasks. These problems can be noticed and measured, but they usually do not interfere with daily activities or the ability to live independently. Mild neurocognitive disorder typically develops after 68 years of age, but it can also develop at younger ages. It is not as serious as major neurocognitive disorder, also known as dementia, but it may be the first sign of it. Generally, symptoms of this condition get worse over time. In rare cases, symptoms can get better. What are the causes? This condition may be caused by: Brain disorders like Alzheimer's disease, Parkinson's disease, and other conditions that gradually damage nerve cells (neurodegenerative conditions). Diseases that affect blood vessels in the brain and result in smTrazodone Tablets What is this medication? TRAZODONE (TRAZ oh done) treats depression. It increases the amount of serotonin in the brain, a hormone that helps regulate mood. This medicine may be used for other purposes; ask your health care provider or pharmacist if you have questions. COMMON BRAND NAME(S): Desyrel What should I tell my care team before I take this medication? They need to know if you have any of these conditions: Attempted suicide or thinking about it Bipolar disorder Bleeding problems Glaucoma Heart disease, or previous heart attack Irregular heart beat Kidney or liver disease Low levels of sodium in the blood An unusual or allergic reaction to trazodone, other medications, foods, dyes or preservatives Pregnant or trying to get pregnant Breast-feeding How should I use this medication? Take this medication by mouth with a glass of water. Follow the  directions on the prescription label. Take this medication shortly after a meal or a light snack. Take your medication at regular intervals. Do not take your medication more often than directed. Do not stop taking this medication suddenly except upon the advice of your care team. Stopping this medication too quickly may cause serious side effects or your condition may worsen. A special MedGuide will be given to you by the pharmacist with each prescription and refill. Be sure to read this information carefully each time. Talk to your care team regarding the use of this medication in children. Special care may be needed. Overdosage: If you think you have taken too much of this medicine contact a poison control center or emergency room at once. NOTE: This medicine is only for you. Do not share this medicine with others. What if I miss a dose? If you miss a dose, take it as soon as you can. If it is almost time for your next dose, take only that dose. Do not take double or extra doses. What may interact with this medication? Do not take this medication with any of the following: Certain medications for fungal infections like fluconazole, itraconazole, ketoconazole, posaconazole, voriconazole Cisapride Dronedarone Linezolid MAOIs like Carbex, Eldepryl, Marplan, Nardil, and Parnate Mesoridazine Methylene blue (injected into a vein) Pimozide Saquinavir Thioridazine This medication may also interact with the following: Alcohol Antiviral medications for HIV or AIDS Aspirin and aspirin-like medications Barbiturates like phenobarbital Certain medications for blood pressure, heart disease, irregular heart beat Certain medications for depression, anxiety, or psychotic disturbances Certain medications for migraine headache like almotriptan, eletriptan, frovatriptan, naratriptan, rizatriptan, sumatriptan, zolmitriptan Certain medications for seizures like carbamazepine and phenytoin Certain medications  for sleep Certain medications that treat or  prevent blood clots like dalteparin, enoxaparin, warfarin Digoxin Fentanyl Lithium NSAIDS, medications for pain and inflammation, like ibuprofen or naproxen Other medications that prolong the QT interval (cause an abnormal heart rhythm) like dofetilide Rasagiline Supplements like St. John's wort, kava kava, valerian Tramadol Tryptophan This list may not describe all possible interactions. Give your health care provider a list of all the medicines, herbs, non-prescription drugs, or dietary supplements you use. Also tell them if you smoke, drink alcohol, or use illegal drugs. Some items may interact with your medicine. What should I watch for while using this medication? Tell your care team if your symptoms do not get better or if they get worse. Visit your care team for regular checks on your progress. Because it may take several weeks to see the full effects of this medication, it is important to continue your treatment as prescribed by your care team. Watch for new or worsening thoughts of suicide or depression. This includes sudden changes in mood, behaviors, or thoughts. These changes can happen at any time but are more common in the beginning of treatment or after a change in dose. Call your care team right away if you experience these thoughts or worsening depression. Manic episodes may happen in patients with bipolar disorder who take this medication. Watch for changes in feelings or behaviors such as feeling anxious, nervous, agitated, panicky, irritable, hostile, aggressive, impulsive, severely restless, overly excited and hyperactive, or trouble sleeping. These changes can happen at any time but are more common in the beginning of treatment or after a change in dose. Call your care team right away if you notice any of these symptoms. You may get drowsy or dizzy. Do not drive, use machinery, or do anything that needs mental alertness until you know  how this medication affects you. Do not stand or sit up quickly, especially if you are an older patient. This reduces the risk of dizzy or fainting spells. Alcohol may interfere with the effect of this medication. Avoid alcoholic drinks. This medication may cause dry eyes and blurred vision. If you wear contact lenses you may feel some discomfort. Lubricating drops may help. See your eye doctor if the problem does not go away or is severe. Your mouth may get dry. Chewing sugarless gum, sucking hard candy and drinking plenty of water may help. Contact your care team if the problem does not go away or is severe. What side effects may I notice from receiving this medication? Side effects that you should report to your care team as soon as possible: Allergic reactions--skin rash, itching, hives, swelling of the face, lips, tongue, or throat Bleeding--bloody or black, tar-like stools, red or dark brown urine, vomiting blood or brown material that looks like coffee grounds, small, red or purple spots on skin, unusual bleeding or bruising Heart rhythm changes--fast or irregular heartbeat, dizziness, feeling faint or lightheaded, chest pain, trouble breathing Low blood pressure--dizziness, feeling faint or lightheaded, blurry vision Low sodium level--muscle weakness, fatigue, dizziness, headache, confusion Prolonged or painful erection Serotonin syndrome--irritability, confusion, fast or irregular heartbeat, muscle stiffness, twitching muscles, sweating, high fever, seizures, chills, vomiting, diarrhea Sudden eye pain or change in vision such as blurry vision, seeing halos around lights, vision loss Thoughts of suicide or self-harm, worsening mood, feelings of depression Side effects that usually do not require medical attention (report to your care team if they continue or are bothersome): Change in sex drive or performance Constipation Dizziness Drowsiness Dry mouth This list may not describe all  possible side effects. Call your doctor for medical advice about side effects. You may report side effects to FDA at 1-800-FDA-1088. Where should I keep my medication? Keep out of the reach of children and pets. Store at room temperature between 15 and 30 degrees C (59 to 86 degrees F). Protect from light. Keep container tightly closed. Throw away any unused medication after the expiration date. NOTE: This sheet is a summary. It may not cover all possible information. If you have questions about this medicine, talk to your doctor, pharmacist, or health care provider.  2022 Elsevier/Gold Standard (2020-05-14 00:00:00) all strokes. Certain infections, such as HIV. Traumatic brain injury. Other medical conditions, such as brain tumors, underactive thyroid (hypothyroidism), and vitamin B12 deficiency. Use of certain drugs or prescription medicines. What increases the risk? The following factors may make you more likely to develop this condition: Being older than 65 years. Being female. Low education level. Diabetes, high blood pressure, high cholesterol, and other conditions that increase the risk for blood vessel diseases. Untreated or undertreated sleep apnea. Having a certain type of gene that can be passed from parent to child (inherited). Chronic health problems such as heart disease, lung disease, liver disease, kidney disease, or depression. What are the signs or symptoms? Symptoms of this condition include: Difficulty remembering. You may: Forget names, phone numbers, or details of recent events. Forget social events and appointments. Repeatedly forget where you put your car keys or other items. Difficulty thinking and solving problems. You may have trouble with complex tasks, such as: Paying bills. Driving in unfamiliar places. Difficulty communicating. You may have trouble: Finding the right word or naming an object. Forming a sentence that makes sense, or understanding what you  read or hear. Changes in your behavior or personality. When this happens, you may: Lose interest in the things that you used to enjoy. Withdraw from social situations. Get angry more easily than usual. Act before thinking. How is this diagnosed? This condition is diagnosed based on: Your symptoms. Your health care provider may ask you and the people you spend time with, such as family and friends, about your symptoms. Evaluation of mental functions (neuropsychological testing). Your health care provider may refer you to a neurologist or mental health specialist to evaluate your mental functions in detail. To identify the cause of your condition, your health care provider may: Get a detailed medical history. Ask about use of alcohol, drugs, and prescription medicines. Do a physical exam. Order blood tests and brain imaging exams. How is this treated? Mild neurocognitive disorder that is caused by medicine use, drug use, infection, or another medical condition may improve when the cause is treated, or when medicines or drugs are stopped. If this disorder has another cause, it generally does not improve and may get worse. In these cases, the goal of treatment is to help you manage the loss of mental function. Treatments in these cases include: Medicine. Medicine mainly helps memory and behavior symptoms. Talk therapy. Talk therapy provides education, emotional support, memory aids, and other ways of making up for problems with mental function. Lifestyle changes, including: Getting regular exercise. Eating a healthy diet that includes omega-3 fatty acids. Challenging your thinking and memory skills. Having more social interaction. Follow these instructions at home: Eating and drinking  Drink enough fluid to keep your urine pale yellow. Eat a healthy diet that includes omega-3 fatty acids. These can be found in: Fish. Nuts. Leafy vegetables. Vegetable oils. If you drink alcohol: Limit  how much you use to: 0-1 drink a day for women. 0-2 drinks a day for men. Be aware of how much alcohol is in your drink. In the [place] one drink equals one 12 oz bottle of beer (355 mL), one 5 oz glass of wine (148 mL), or one 1 oz glass of hard liquor (44 mL). Lifestyle  Get regular exercise as told by your health care provider. Do not use any products that contain nicotine or tobacco, such as cigarettes, e-cigarettes, and chewing tobacco. If you need help quitting, ask your health care provider. Practice ways to manage stress. If you need help managing stress, ask your health care provider. Continue to have social interaction. Keep your mind active with stimulating activities you enjoy, such as reading or playing games. Make sure to get quality sleep. Follow these tips: Avoid napping during the day. Keep your sleeping area dark and cool. Avoid exercising during the few hours before you go to bed. Avoid caffeine products in the evening. General instructions Take over-the-counter and prescription medicines only as told by your health care provider. Your health care provider may recommend that you avoid taking medicines that can affect thinking, such as pain medicines or sleep medicines. Work with your health care provider to find out what you need help with and what your safety needs are. Keep all follow-up visits. This is important. Where to find more information [employer] on Aging: http://kim-miller.com/ Contact a health care provider if: You have any new symptoms. Get help right away if: You develop new confusion or your confusion gets worse. You act in ways that place you or your family in danger. Summary Mild neurocognitive disorder is a disorder in which memory does not work as well as it should. Mild neurocognitive disorder can have many causes. It may be the first stage of dementia. To manage your condition, get regular exercise, keep your mind active, get quality sleep, and  eat a healthy diet. This information is not intended to replace advice given to you by your health care provider. Make sure you discuss any questions you have with your health care provider. Document Revised: 10/08/2019 Document Reviewed: 10/08/2019 Elsevier Patient Education  [place].

## 2021-06-24 NOTE — Telephone Encounter (Signed)
Medicare/aarp order sent to [facility] they will reach out to the patient to schedule.

## 2021-06-24 NOTE — Progress Notes (Signed)
SLEEP MEDICINE CLINIC    Provider:  Larey Seat, MD  Primary Care Physician:  [name] [name], [place] [address]     Referring Provider: Kathrynn Ducking, Md No address on file          Chief Complaint according to patient   Patient presents with:     New Patient (Initial Visit)           HISTORY OF PRESENT ILLNESS:   06-24-2021: [name] [name] is a 66 y.o.Caucasian female patient seen here as a referral on 06/24/2021 from Dr [name] [name], for a sleep evaluation.  Chief concern according to patient :   Referral from Dr. Jannifer [name], who followed her for memory disorder associated with fatigue and frequent awakening. Wants evaluation  to see if her forgetfulness is coming from possible sleep apnea.  Pt is not aware of any snoring or apneic episodes.  Dr .[name] [name] did MMSE and MRI and had her followed by NP.  She reports slower word finding, naming, and some spatial orientation difficulties with finding the car in a car park.  Sleep relevant medical history: Her sleep is not refreshing, fragmented, tossing and turning. Initiating sleep is difficult and insomnia also following sleep interruption after nocturia 2-4 times at night.  She has had a tonsillectomy and adenoid ectomy- age 84, DDD cervical spine, enough degeneration affecting  her barium swallow test- no osteoporosis. Thyroid biopsy.  Former smoker, quit 2014. After her husband passed she used low dose ativan and did not like the hang- over effect.   [name] [name]   has a past medical history of Anxiety, Cancer ([place]), Complication of anesthesia, Congenital renal atrophy, COPD (chronic obstructive pulmonary disease) ([place]), Coronary artery disease, Degenerative arthritis, GERD (gastroesophageal reflux disease), History of migraine, Hypertension, Memory disorder (03/29/2017), Migraine equivalent (11/09/2012), PONV (postoperative nausea and vomiting), PVC (premature ventricular contraction),  Shortness of breath dyspnea, Thyroid nodule, Varicose veins, Visual disturbance, Vitamin D deficiency, and Wears glasses.      Family medical /sleep history: non diagnosed family member on CPAP with OSA, many people have insomnia, no sleep walkers.  2 sisters, a brother, mother with insomnia.    Social history:  Patient is retired from Therapist, sports- former Geophysical data processor- and lives in a household alone. Family status is widowed 4/ 2018 , with adult children. She has cats and dogs. A daughter has moved back in.   The patient used to work in shifts( night/ rotating,) Tobacco use quit 2014.   ETOH use - rare ,  Caffeine intake in form of Coffee( 1 cup in AM) Soda( /) Tea ( /) or energy drinks. Regular exercise : none .   Hobbies : too tired    Sleep habits are as follows: The patient's dinner time is between 6-8 PM. The patient goes to bed at 11-12 PM and continues to sleep for intervals of 1-3  hours, wakes for many bathroom breaks, the first time at 1 AM.   The preferred sleep position is laterally, with the support of 1-2 pillows.  Dreams are reportedly rare.  8-9  AM is the usual rise time. The patient wakes up spontaneously or by puppy alarm.  She reports not feeling refreshed or restored in AM, with symptoms such as dry mouth, no morning headaches, and residual fatigue.  Verapamil controlled her migraines.  Naps are taken infrequently, lasting from 60 to 120 minutes .   Review of Systems: Out of a  complete 14 system review, the patient complains of only the following symptoms, and all other reviewed systems are negative.:  Fatigue, sleepiness , snoring, fragmented sleep, Insomnia NOCTURIA, no RLS.     How likely are you to doze in the following situations: 0 = not likely, 1 = slight chance, 2 = moderate chance, 3 = high chance   Sitting and Reading? Watching Television? Sitting inactive in a public place (theater or meeting)? As a passenger in a car for an hour without a break? Lying  down in the afternoon when circumstances permit? Sitting and talking to someone? Sitting quietly after lunch without alcohol? In a car, while stopped for a few minutes in traffic?   Total = /7 24 points   FSS endorsed at 41/ 63 points.   Social History   Socioeconomic History   Marital status: Widowed    Spouse name: Not on file   Number of children: 1   Years of education: 67   Highest education level: Not on file  Occupational History   Occupation: Nurse    Employer: [employer]  Tobacco Use   Smoking status: Former    Packs/day: 0.50    Years: 40.00    Pack years: 20.00    Types: Cigarettes    Quit date: 03/17/2013    Years since quitting: 8.2   Smokeless tobacco: Never  Vaping Use   Vaping Use: Never used  Substance and Sexual Activity   Alcohol use: Yes    Alcohol/week: 0.0 standard drinks    Comment: " rare"   Drug use: No   Sexual activity: Not on file  Other Topics Concern   Not on file  Social History Narrative   Lives alone. Husband passed away 09-18-16   Caffeine use: 1-2 cups coffee per day   Right handed    Social Determinants of Health   Financial Resource Strain: Not on file  Food Insecurity: Not on file  Transportation Needs: Not on file  Physical Activity: Not on file  Stress: Not on file  Social Connections: Not on file    Family History  Problem Relation Age of Onset   Cancer Mother        cancer of the small intestines   Hypertension Father    Atrial fibrillation Father    Diverticulitis Father    Hypertension Sister    Hypertension Sister    Headache Sister    Hypertension Brother    Diverticulitis Brother     Past Medical History:  Diagnosis Date   Anxiety    husband also cancer pt   Cancer ([place])    lung   Complication of anesthesia    bronchospasms post-op (pulmonary did consult but pt was discharged later that day)  With last surg. she was given albuterol inhaler & had steroid with surg.     Congenital renal atrophy     left kidney, one spontaneous stone passed    COPD (chronic obstructive pulmonary disease) ([facility])    Coronary artery disease    Degenerative arthritis    spine, hands & knees    GERD (gastroesophageal reflux disease)    History of migraine    Hypertension    h/o PVC- asymptomatic    Memory disorder 03/29/2017   Migraine equivalent 11/09/2012   atypical - loss of vision, transient, takes Verapimil for migraine & BP control    PONV (postoperative nausea and vomiting)    PVC (premature ventricular contraction)    Shortness of breath  dyspnea    with exertion   Thyroid nodule    benign by biopsy   Varicose veins    left leg   Visual disturbance    Episodic   Vitamin D deficiency    Wears glasses     Past Surgical History:  Procedure Laterality Date   CARPAL TUNNEL RELEASE Left    CESAREAN SECTION     COLONOSCOPY W/ BIOPSIES AND POLYPECTOMY     DILATION AND CURETTAGE OF UTERUS     TONSILLECTOMY     VARICOSE VEIN SURGERY     left   VIDEO ASSISTED THORACOSCOPY (VATS)/ LOBECTOMY Right 07/23/2014   Procedure: VIDEO ASSISTED THORACOSCOPY (VATS)/ LOBECTOMY;  Surgeon: Ivin Poot, MD;  Location: [facility];  [name]: Thoracic;  Laterality: Right;   VIDEO BRONCHOSCOPY N/A 07/23/2014   Procedure: VIDEO BRONCHOSCOPY;  Surgeon: Ivin Poot, MD;  Location: [facility];  [name]: Thoracic;  Laterality: N/A;   VIDEO BRONCHOSCOPY WITH ENDOBRONCHIAL ULTRASOUND Right 04/17/2014   Procedure: VIDEO BRONCHOSCOPY WITH ENDOBRONCHIAL ULTRASOUND;  Surgeon: Collene Gobble, MD;  Location: [facility];  [name]: Thoracic;  Laterality: Right;   VIDEO BRONCHOSCOPY WITH ENDOBRONCHIAL ULTRASOUND N/A 07/08/2014   Procedure: VIDEO BRONCHOSCOPY WITH ENDOBRONCHIAL ULTRASOUND;  Surgeon: Ivin Poot, MD;  Location: [facility];  [name]: Thoracic;  Laterality: N/A;     Current Outpatient Medications on File Prior to Visit  Medication Sig Dispense Refill   ALPRAZolam (XANAX) 0.5 MG tablet Take 0.5 mg by mouth as needed.      aspirin EC 81 MG tablet Take 1 tablet (81 mg total) by mouth daily. 90 tablet 3   empagliflozin (JARDIANCE) 10 MG TABS tablet Take 1 tablet (10 mg total) by mouth daily before breakfast. 30 tablet 11   losartan (COZAAR) 50 MG tablet Take 1 tablet (50 mg total) by mouth daily. 90 tablet 3   nitroGLYCERIN (NITROSTAT) 0.4 MG SL tablet Place 1 tablet (0.4 mg total) under the tongue every 5 (five) minutes as needed for chest pain. 25 tablet 6   rosuvastatin (CRESTOR) 10 MG tablet Take 1 tablet (10 mg total) by mouth daily. 90 tablet 3   VENTOLIN HFA 108 (90 Base) MCG/ACT inhaler INHALE 1 PUFF BY MOUTH THREE TIMES DAILY AS NEEDED FOR WHEEZING OR SHORTNESS OF BREATH 18 g 9   Vitamin D, Ergocalciferol, (DRISDOL) 50000 units CAPS capsule Take 50,000 Units by mouth every 7 (seven) days.     Current Facility-Administered Medications on File Prior to Visit  Medication Dose Route Frequency Provider Last Rate Last Admin   Ampicillin-Sulbactam (UNASYN) 3 g in sodium chloride 0.9 % 100 mL IVPB  3 g Intravenous Once Avelina Laine, PA-C        Allergies  Allergen Reactions   Advil [Ibuprofen] Other (See Comments)    Causes hematuria    Codeine Hives   Hydrocodone-Guaifenesin Itching   Sulfa Antibiotics Hives and Swelling   Temazepam Other (See Comments)    dizziness   Plasticized Base [Plastibase] Rash    tape   Zofran [Ondansetron Hcl] Palpitations    Physical exam:  Today's Vitals   06/24/21 1000  BP: (!) 154/81  Pulse: 82  Weight: 173 lb 8 oz (78.7 kg)  Height: 5' 5.5" (1.664 m)   Body mass index is 28.43 kg/m.   Wt Readings from Last 3 Encounters:  06/24/21 173 lb 8 oz (78.7 kg)  06/15/21 173 lb 6.4 oz (78.7 kg)  06/10/21 175 lb 9.6 oz (79.7 kg)  Ht Readings from Last 3 Encounters:  06/24/21 5' 5.5" (1.664 m)  06/10/21 5' 5.5" (1.664 m)  05/22/21 5' 5.5" (1.664 m)      General: The patient is awake, alert and appears not in acute distress. The patient is well  groomed. Head: Normocephalic, atraumatic.  Neck is supple. Mallampati 3  neck circumference:14 inches . Nasal airflow  patent. Prognathia is seen.  Dental status: biological teeth  Cardiovascular:  Regular rate and cardiac rhythm by pulse,  without distended neck veins. Respiratory: Lungs are clear to auscultation.  Skin:  Without evidence of ankle edema, or rash. Trunk: The patient's posture is erect.   Neurologic exam : The patient is awake and alert, oriented to place and time.   Memory subjective described as intact.  Attention span & concentration ability appears normal.  Speech is slowed, non-fluent,  without  dysarthria, dysphonia .  Mood and affect are appropriate.   Cranial nerves: no loss of smell or taste reported  Pupils are equal and briskly reactive to light. Funduscopic exam deferred..  Extraocular movements in vertical and horizontal planes were intact and without nystagmus. No Diplopia. Visual fields by finger perimetry are intact. Hearing was intact to soft voice and finger rubbing.   Facial sensation intact to fine touch. Facial motor strength is symmetric and tongue and uvula move midline.  Neck ROM : rotation, tilt and flexion extension were normal for age and shoulder shrug was symmetrical.    Motor exam:  Symmetric bulk, tone and ROM.   Normal tone without cog wheeling, symmetric grip strength .   Sensory:  Fine touch, pinprick and vibration were tested  and  normal.  Proprioception tested in the upper extremities was normal.   Coordination: Rapid alternating movements in the fingers/hands were of normal speed.  The Finger-to-nose maneuver was intact without evidence of ataxia, dysmetria or tremor. Arthritic changes to fingers.  Gait and station: Patient could rise unassisted from a seated position, walked without assistive device.  Stance is of normal width/ base a.  Toe and heel walk were deferred.  Deep tendon reflexes: in the upper and lower extremities  are symmetric and intact.  Babinski response was deferred.        Summary- assessment   1) COGNITIVE DECLINE, subjectively- progressive word finding difficulties:   2)Long standing Insomnia. I want to see her sleep architecture.   3) Stage 3 Lung cancer - 2016-2018 . Monitored. Dr Earlie Server, Dr Lamonte Sakai.    After spending a total time of  45  minutes face to face and additional time for physical and neurologic examination, review of laboratory studies,  personal review of imaging studies, reports and results of other testing and review of referral information / records as far as provided in visit, I have established the following Plan     My Plan is to proceed with:  1) Mental status- cognitive slowing, delayed wordfinding- MCI ? college graduate - needs MOCA test-  2) attended sleep study, looking for insomnia causes of arousals, not a snorer, no history of apnea in the past, but pulmonary cancer .   3) Trazodone for non addictive sleep aid.   I would like to thank [name] Hatchet, MD and [name] Ducking, Md No address on file for allowing me to meet with and to take care of this pleasant patient.   I I plan to follow up either personally or through our NP within 2-4  month.   CC: I will share my notes  with PCP. [name] Kitchen  Electronically signed by: [name] Seat, MD 06/24/2021 10:24 AM  [facility] Board certified by The [employer] of Sleep Medicine and Diplomate of the [employer] of Sleep Medicine. Board certified In Neurology through the [place], Fellow of the [employer] of Neurology. Medical Director of [facility].

## 2021-06-25 ENCOUNTER — Other Ambulatory Visit: Payer: Medicare Other

## 2021-06-26 NOTE — Telephone Encounter (Signed)
Okay to work on diet and weight loss.  Okay to hold on starting the medication.  ----- Message -----  From: Antonieta Iba, RN  Sent: 06/22/2021   4:15 PM EST  To: Belva Crome, MD   Spoke with pt and made her aware of information from Dr. Tamala Julian.  Pt appreciative for call.

## 2021-06-26 NOTE — Telephone Encounter (Signed)
Left message to call back  

## 2021-06-27 LAB — ANA,IFA RA DIAG PNL W/RFLX TIT/PATN
ANA Titer 1: NEGATIVE
Cyclic Citrullin Peptide Ab: 5 units (ref 0–19)
Rheumatoid fact SerPl-aCnc: <10 IU/mL (ref <14.0)

## 2021-06-27 LAB — COMPREHENSIVE METABOLIC PANEL
ALT: 14 IU/L (ref 0–32)
AST: 17 IU/L (ref 0–40)
Albumin/Globulin Ratio: 1.7 (ref 1.2–2.2)
Albumin: 4.5 g/dL (ref 3.8–4.8)
Alkaline Phosphatase: 122 IU/L — ABNORMAL HIGH (ref 44–121)
BUN/Creatinine Ratio: 19 (ref 12–28)
BUN: 15 mg/dL (ref 8–27)
Bilirubin Total: 0.7 mg/dL (ref 0.0–1.2)
CO2: 21 mmol/L (ref 20–29)
Calcium: 9.5 mg/dL (ref 8.7–10.3)
Chloride: 105 mmol/L (ref 96–106)
Creatinine, Ser: 0.81 mg/dL (ref 0.57–1.00)
Globulin, Total: 2.7 g/dL (ref 1.5–4.5)
Glucose: 102 mg/dL — ABNORMAL HIGH (ref 70–99)
Potassium: 4.3 mmol/L (ref 3.5–5.2)
Sodium: 142 mmol/L (ref 134–144)
Total Protein: 7.2 g/dL (ref 6.0–8.5)
eGFR: 80 mL/min/{1.73_m2} (ref >59)

## 2021-06-27 LAB — DEMENTIA PANEL
Homocysteine: 15.8 umol/L (ref 0.0–17.2)
RPR Ser Ql: NONREACTIVE
TSH: 0.772 u[IU]/mL (ref 0.450–4.500)
Vitamin B-12: 315 pg/mL (ref 232–1245)

## 2021-06-27 LAB — METHYLMALONIC ACID, SERUM: Methylmalonic Acid: 177 nmol/L (ref 0–378)

## 2021-06-27 NOTE — Progress Notes (Signed)
Only elevated alk phos is a new finding, non fast glucose was expectedly elevated. All other test results were normal.   She is not on my chart

## 2021-06-29 ENCOUNTER — Telehealth: Payer: Self-pay | Admitting: Neurology

## 2021-06-29 NOTE — Telephone Encounter (Signed)
-----  Message from Larey Seat, MD sent at 06/27/2021  3:01 PM EST ----- Only elevated alk phos is a new finding, non fast glucose was expectedly elevated. All other test results were normal.   She is not on my chart

## 2021-06-29 NOTE — Telephone Encounter (Signed)
Called the pt and there was no answer. I left a detailed message advising of the lab results and asked for a call back if there were any questions.

## 2021-06-29 NOTE — Progress Notes (Signed)
[facility] PREP Weekly Session  Patient Details  Name: [name] [name] MRN: [id number] Date of Birth: 01-11-1956 Age: 66 y.o. PCP: [name], MD  Vitals:   06/29/21 1321  Weight: 172 lb 12.8 oz (78.4 kg)     [facility] Weekly seesion - 06/29/21 1300       [facility] "PREP" Location   [facility] "PREP" Location [facility]      Weekly Session   Topic Discussed Stress management and problem solving   fingertip/breath work Advertising account planner; fruit bowl guided meditation   Minutes exercised this week 120 minutes    Classes attended to date [place] 06/29/2021, 1:22 PM

## 2021-06-30 ENCOUNTER — Telehealth: Payer: Self-pay | Admitting: Neurology

## 2021-06-30 NOTE — Telephone Encounter (Signed)
LVM for pt to call me back to schedule sleep study  

## 2021-07-06 NOTE — Progress Notes (Signed)
[facility] PREP Weekly Session  Patient Details  Name: Carlisa Eble MRN: [id number] Date of Birth: 11/22/1955 Age: 66 y.o. PCP: [name], MD  Vitals:   07/06/21 1523  Weight: 173 lb 3.2 oz (78.6 kg)     [facility] Weekly seesion - 07/06/21 1500       [facility] "PREP" Location   [facility] "PREP" Location [facility]      Weekly Session   Topic Discussed Expectations and non-scale victories   Reminder to do 6 week review, revisit, restate health and fitness goals set at start of program.   Minutes exercised this week 120 minutes    Classes attended to date [place] 07/06/2021, 3:24 PM

## 2021-07-08 ENCOUNTER — Ambulatory Visit
Admission: RE | Admit: 2021-07-08 | Discharge: 2021-07-08 | Disposition: A | Discharge status: Home / Self Care | Payer: Medicare Other | Source: Ambulatory Visit | Attending: Neurology | Admitting: Neurology

## 2021-07-08 ENCOUNTER — Other Ambulatory Visit: Payer: Self-pay

## 2021-07-08 DIAGNOSIS — G3184 Mild cognitive impairment, so stated: Secondary | ICD-10-CM | POA: Diagnosis not present

## 2021-07-08 DIAGNOSIS — C3431 Malignant neoplasm of lower lobe, right bronchus or lung: Secondary | ICD-10-CM | POA: Diagnosis not present

## 2021-07-08 MED ORDER — GADOBENATE DIMEGLUMINE 529 MG/ML IV SOLN
15.0000 mL | Freq: Once | INTRAVENOUS | Status: AC | PRN
  Administered 2021-07-08: 15 mL via INTRAVENOUS

## 2021-07-09 ENCOUNTER — Ambulatory Visit: Payer: Medicare Other | Admitting: Interventional Cardiology

## 2021-07-09 ENCOUNTER — Telehealth: Payer: Self-pay | Admitting: Neurology

## 2021-07-09 NOTE — Telephone Encounter (Signed)
Pt has not  taken traZODone (DESYREL) 50 MG tablet the last couple nights and want to make I can stay off the medication until after the sleep study. Would like a call from the nurse.

## 2021-07-09 NOTE — Telephone Encounter (Signed)
Called and spoke w/ pt. She took trazodone 50mg , 1/2 tab po qhs for 4 nights. Ineffective. She increased dose to 1 tab po qhs. However, she states it was still ineffective and she was having weird dreams. She states even w/ trazodone, taking 2-3 hr to go to sleep and she wakes up q 2 hr thereafter. She prefers to wait until after sleep study to determine if she wants to continue trazodone or discuss alternate options. Advised this was ok.    Advised we need at least 4 hr of data (does not need to be consecutive) for sleep study. She verbalized understanding.

## 2021-07-13 ENCOUNTER — Ambulatory Visit (INDEPENDENT_AMBULATORY_CARE_PROVIDER_SITE_OTHER): Payer: Medicare Other | Admitting: Neurology

## 2021-07-13 ENCOUNTER — Telehealth: Payer: Self-pay | Admitting: Neurology

## 2021-07-13 DIAGNOSIS — C3431 Malignant neoplasm of lower lobe, right bronchus or lung: Secondary | ICD-10-CM

## 2021-07-13 DIAGNOSIS — G4733 Obstructive sleep apnea (adult) (pediatric): Secondary | ICD-10-CM

## 2021-07-13 DIAGNOSIS — R351 Nocturia: Secondary | ICD-10-CM

## 2021-07-13 DIAGNOSIS — F5104 Psychophysiologic insomnia: Secondary | ICD-10-CM

## 2021-07-13 DIAGNOSIS — G3184 Mild cognitive impairment, so stated: Secondary | ICD-10-CM

## 2021-07-13 DIAGNOSIS — G43109 Migraine with aura, not intractable, without status migrainosus: Secondary | ICD-10-CM

## 2021-07-13 DIAGNOSIS — I493 Ventricular premature depolarization: Secondary | ICD-10-CM

## 2021-07-13 NOTE — Telephone Encounter (Signed)
-----   Message from Darleen Crocker, RN sent at 07/13/2021  7:49 AM EST ----- Normal MRI ----- Message ----- From: Penni Bombard, MD Sent: 07/11/2021   2:55 PM EST To: Larey Seat, MD

## 2021-07-13 NOTE — Progress Notes (Signed)
[facility] PREP Weekly Session  Patient Details  Name: [name] [name] MRN: [id number] Date of Birth: [birth year] Age: 66 y.o. PCP: [name], MD  Vitals:   07/13/21 1245  Weight: 171 lb 6.4 oz (77.7 kg)     [facility] Weekly seesion - 07/13/21 1200       [facility] "PREP" Location   [facility] "PREP" Location [facility]      Weekly Session   Topic Discussed Other   Portion size matters; visualize your portion size demo; review of food labels   Minutes exercised this week 150 minutes    Classes attended to date [place] 07/13/2021, 12:46 PM

## 2021-07-13 NOTE — Telephone Encounter (Signed)
Called the pt and there was no answer. LVM on home machine advising of the normal MRI and to call back with questions.   **If pt returns call please advise that our work in doctor reviewed her MRI of the brain results and it was normal finding. Advise there was nothing that appeared concerning.

## 2021-07-20 NOTE — Progress Notes (Signed)
[facility] PREP Weekly Session  Patient Details  Name: [name] [name] MRN: [id number] Date of Birth: 10/14/1955 Age: 66 y.o. PCP: [name], MD  Vitals:   07/20/21 1303  Weight: 170 lb 6.4 oz (77.3 kg)     [facility] Weekly seesion - 07/20/21 1300       [facility] "PREP" Location   [facility] "PREP" Location [facility]      Weekly Session   Topic Discussed Finding support   Review of several food labels, [employer] app   Minutes exercised this week 165 minutes    Classes attended to date [place] 07/20/2021, 1:04 PM

## 2021-07-23 NOTE — Progress Notes (Signed)
Normal brain MRI for age. No findings explaining cognitive difficulties.  PENUMALLI, MD

## 2021-07-27 ENCOUNTER — Ambulatory Visit: Payer: Medicare Other | Admitting: Neurology

## 2021-07-27 NOTE — Progress Notes (Signed)
[facility] PREP Weekly Session  Patient Details  Name: [name] [name] MRN: [id number] Date of Birth: Jul 21, 1955 Age: 66 y.o. PCP: [name], MD  Vitals:   07/27/21 1247  Weight: 170 lb (77.1 kg)     [facility] Weekly seesion - 07/27/21 1200       [facility] "PREP" Location   [facility] "PREP" Location [facility]      Weekly Session   Topic Discussed Calorie breakdown   Membership talk by [facility] staff; Plan to next 90 days after this program ends; importance of carbs/fats/protein/quality and nutrient dense foods   Minutes exercised this week 170 minutes    Classes attended to date [place] 07/27/2021, 12:48 PM

## 2021-07-28 DIAGNOSIS — G4733 Obstructive sleep apnea (adult) (pediatric): Secondary | ICD-10-CM | POA: Insufficient documentation

## 2021-07-28 HISTORY — DX: Obstructive sleep apnea (adult) (pediatric): G47.33

## 2021-07-28 NOTE — Progress Notes (Signed)
The arousals were noted as: 42 were spontaneous, 1 associated with PLMs, 6 were associated with respiratory events.  IMPRESSION: Insomnia. sleep fragmentation by unknown causes.   1. Very mild Obstructive Sleep Apnea(OSA) at AHI of 8.6/h. but a strongly REM sleep dependent apnea.  2. Many Periodic Limb Movement while sleeping on the left side- and few related arousals. 3. Nocturia.   4. No significant Snoring. 5. Most spontaneous arousals occurred in the first 2 hours of sleep while in supine position. I cannot identify a physiological cause.   RECOMMENDATIONS:  1)Insomnia for unknown reasons, spontaneous arousals indicate no physiological cause. Patient may consider cognitive behavior therapy. 2_Nocturia also interrupted sleep 3 times. Has she tried Detrol? Urology evaluation?   3) OSA- Treatment of such mild apnea is optional. Since there is a strong REM sleep dependency, the treatment option is limited to PAP.  If the patient wants to try CPAP, I will order an autotitration device with settings from 6 through 12 cm water, 2 cm EPR. heated humidification and nasal pillow of her choice.    4) PLM were positional dependent and did not lead to arousals : The patient should avoid the left lateral sleep position and try to elevate the head of bed with a wedge or firm pillow.

## 2021-07-28 NOTE — Procedures (Signed)
PATIENT'S NAME:  [name] [name], [name] [name] DOB:      06-Apr-1956      MR#:    [id number]     DATE OF RECORDING: 07/13/2021 REFERRING M.D.:  Kathrynn Ducking, MD/ Dr. Ardeth Perfect is PCP.  Study Performed:   Baseline Polysomnogram HISTORY:  [name] [name] is a 66 y.o. female patient who was seen on 06/24/2021, upon referral from Dr Jannifer Franklin for a sleep evaluation just prior to is retirement. This patient is new to me.  Chief concern according to patient:   Referral from Dr. Jannifer Franklin, who followed her for memory disorder.  Reports fatigue and frequent awakenings. Wants evaluation to see if her forgetfulness is coming from possible sleep apnea. She reports slower word finding, naming, and some spatial orientation difficulties with finding the car in a larger parking area.  Pt is not aware of any snoring or apneic episodes. The patient endorsed the Epworth Sleepiness Scale at 7 points.  FSS at 41/ 63 points, more fatigued than sleepy.  Dr.Willis obtained MMSE and brain MRI and she was followed by NP.   Her sleep is not refreshing, is fragmented, she is tossing and turning. Initiating sleep is difficult and insomnia also following sleep interruptions after nocturia 2-4 times at night.  She has had a tonsillectomy and adenoidectomy- age 43, DDD cervical spine, enough degeneration affecting her barium swallow test- but no osteoporosis. History of lung cancer ? Has had a Thyroid biopsy.  Former smoker, quit 2014. After her husband passed away, she initially used low dose Ativan for anxiety and d/c soon after - she did not like the hang- over effect. The patient endorsed the Epworth Sleepiness Scale at 7 points.  FSS at 41/ 63 points, more fatigued than sleepy.  The patient's weight 173 pounds with a height of 65 (inches), resulting in a BMI of 28.7 kg/m2. The patient's neck circumference measured 14 inches.  CURRENT MEDICATIONS: Ventolin HFA, Xanax, Unasyn, Aspirin EC, Drisdol, Cozaar, Nitrostat, Crestor, Desyrel    PROCEDURE:  This is a multichannel digital polysomnogram utilizing the Somnostar 11.2 system.  Electrodes and sensors were applied and monitored per AASM Specifications.   EEG, EOG, Chin and Limb EMG, were sampled at 200 Hz.  ECG, Snore and Nasal Pressure, Thermal Airflow, Respiratory Effort, CPAP Flow and Pressure, Oximetry was sampled at 50 Hz. Digital video and audio were recorded.      BASELINE STUDY: Lights Out was at 20:39 and Lights On at 05:00.  Total recording time (TRT) was 501.5 minutes, with a total sleep time (TST) of 333 minutes.   The patient's sleep latency was 18.5 minutes.  REM latency was 156.5 minutes.  The sleep efficiency was suboptimal at  66.4 %.     SLEEP ARCHITECTURE: WASO (Wake after sleep onset) was 150 minutes.  There were 9 minutes in Stage N1, 221 minutes Stage N2, 37.5 minutes Stage N3 and 65.5 minutes in Stage REM.  The percentage of Stage N1 was 2.7%, Stage N2 was 66.4%, Stage N3 was 11.3% and Stage R (REM sleep) was 19.7%.     RESPIRATORY ANALYSIS:  There were a total of 47 respiratory events:  10 obstructive apneas and 37 hypopneas with a total APNEA/HYPOPNEA INDEX (AHI) of 8.5/h.  23 events occurred in REM sleep and 38 events in NREM. The REM AHI was 21.1 /hour, versus a non-REM AHI of 5.4.  The patient spent 245 minutes of total sleep time in the supine position and 88 minutes in non-supine. The supine AHI  was 9.8 versus a non-supine AHI of 4.7.  OXYGEN SATURATION & C02:  The Wake baseline 02 saturation was 92%, with the lowest being 84%. Time spent below 89% saturation equaled 4 minutes.   PERIODIC LIMB MOVEMENTS:   The patient had a total of 129 Periodic Limb Movements.  The Periodic Limb Movement (PLM) Arousal index was 0.2/hour. No PLMs during REM sleep. All PLMs clustered in left lateral NREM sleep. Audio and video analysis did not show any abnormal or unusual movements, behaviors, phonations or vocalizations.  No Snoring was noted. EKG was in keeping with  normal sinus rhythm (NSR).  The arousals were noted as: 42 were spontaneous, 1 associated with PLMs, 6 were associated with respiratory events.  IMPRESSION: Insomnia. sleep fragmentation by unknown causes.   Very mild Obstructive Sleep Apnea(OSA) at AHI of 8.6/h. but a strongly REM sleep dependent apnea.  Many Periodic Limb Movement while sleeping on the left side- and few related arousals. Nocturia.   No significant Snoring. Most spontaneous arousals occurred in the first 2 hours of sleep while in supine position. I cannot identify a physiological cause.   RECOMMENDATIONS:  1)Insomnia for unknown reasons, spontaneous arousals indicate no physiological cause. Patient may consider cognitive behavior therapy. 2_Nocturia also interrupted sleep 3 times. Has she tried Detrol? Urology evaluation?   3) OSA- Treatment of such mild apnea is optional. Since there is a strong REM sleep dependency, the treatment option is limited to PAP.  If the patient wants to try CPAP, I will order an autotitration device with settings from 6 through 12 cm water, 2 cm EPR. heated humidification and nasal pillow of her choice.    4) PLM were positional dependent and did not lead to arousals : The patient should avoid the left lateral sleep position and try to elevate the head of bed with a wedge or firm pillow.     I certify that I have reviewed the entire raw data recording prior to the issuance of this report in accordance with the Standards of Accreditation of the American Academy of Sleep Medicine (AASM)      Larey Seat, MD Diplomat, American Board of Neurology and of Sleep Medicine Medical Director, [facility]

## 2021-07-29 ENCOUNTER — Telehealth: Payer: Self-pay | Admitting: *Deleted

## 2021-07-29 NOTE — Telephone Encounter (Signed)
LVM for pt to call about results. °

## 2021-07-29 NOTE — Telephone Encounter (Signed)
-----   Message from Larey Seat, MD sent at 07/28/2021  6:01 PM EST ----- The arousals were noted as: 42 were spontaneous, 1 associated with PLMs, 6 were associated with respiratory events.  IMPRESSION: Insomnia. sleep fragmentation by unknown causes.   1. Very mild Obstructive Sleep Apnea(OSA) at AHI of 8.6/h. but a strongly REM sleep dependent apnea.  2. Many Periodic Limb Movement while sleeping on the left side- and few related arousals. 3. Nocturia.   4. No significant Snoring. 5. Most spontaneous arousals occurred in the first 2 hours of sleep while in supine position. I cannot identify a physiological cause.   RECOMMENDATIONS:  1)Insomnia for unknown reasons, spontaneous arousals indicate no physiological cause. Patient may consider cognitive behavior therapy. 2_Nocturia also interrupted sleep 3 times. Has she tried Detrol? Urology evaluation?   3) OSA- Treatment of such mild apnea is optional. Since there is a strong REM sleep dependency, the treatment option is limited to PAP.  If the patient wants to try CPAP, I will order an autotitration device with settings from 6 through 12 cm water, 2 cm EPR. heated humidification and nasal pillow of her choice.    4) PLM were positional dependent and did not lead to arousals : The patient should avoid the left lateral sleep position and try to elevate the head of bed with a wedge or firm pillow.

## 2021-07-30 NOTE — Telephone Encounter (Signed)
Pt returned call and I was able to review the sleep study with her in more detail. I reviewed the sleep study and reviewed all the recommendations that were made by Dr Dohmeier.  Patient has asked that a copy of her lab work, sleep study results, MRI be sent to her.  Confirmed address on file was correct.  Patient provided an update that the trazodone was tried at 25 mg with no success in helping with going to sleep.  She then increased to 50 mg tablet and noticed that she had increased anxiety at that level, so she stopped.  She stated it also did not help with her falling asleep.  Patient question whether a follow-up visit completed with Dr. Brett Fairy to review all of this information and plan.  A follow-up appointment was made on May 4 at 2:30 PM with a check-in of 2 pm.  Advised the patient would need to check in early to complete a memory testing.  Patient was appreciative for the information and verbalized understanding.

## 2021-07-30 NOTE — Addendum Note (Signed)
Addended by: Darleen Crocker on: 07/30/2021 04:54 PM   Modules accepted: Orders

## 2021-07-31 ENCOUNTER — Ambulatory Visit: Payer: Medicare Other | Admitting: Interventional Cardiology

## 2021-08-03 NOTE — Progress Notes (Signed)
[facility] PREP Weekly Session  Patient Details  Name: [name] [name] MRN: [id number] Date of Birth: Oct 10, 1955 Age: 66 y.o. PCP: [name], MD  Vitals:   08/03/21 1526  Weight: 171 lb 6.4 oz (77.7 kg)     [facility] Weekly seesion - 08/03/21 1500       [facility] "PREP" Location   [facility] "PREP" Location [facility]      Weekly Session   Topic Discussed Hitting roadblocks   Reviewed goals and activity plan for next 90 days after PRPE classes end; to bring to final assessment visit next week.   Minutes exercised this week 140 minutes    Classes attended to date [place] 08/03/2021, 3:29 PM

## 2021-08-10 NOTE — Progress Notes (Signed)
[facility] PREP Weekly Session ? ?Patient Details  ?Name: [name] [name] ?MRN: [id number] ?Date of Birth: 10/28/1955 ?Age: 66 y.o. ?PCP: [name], MD ? ?There were no vitals filed for this visit. ? ? [facility] Weekly seesion - 08/10/21 1200   ? ?  ? [facility] "PREP" Location  ? [facility] "PREP[facility]   ?  ? Weekly Session  ? Topic Discussed Other   Fit testing completed, final assessment visit scheduled for Wednesday; to bring goals and acitivity plan and PREP survey to final visit.  ? Minutes exercised this week 150 minutes   ? Classes attended to date 64   ? ?  ?  ? ?  ? ? ?[name] [name] ?08/10/2021, 12:53 PM ? ? ?

## 2021-08-12 NOTE — Progress Notes (Signed)
[facility] PREP Evaluation ? ?Patient Details  ?Name: [name] [name] ?MRN: [id number] ?Date of Birth: April 18, 1956 ?Age: 66 y.o. ?PCP: [name], MD ? ?Vitals:  ? 08/12/21 1232  ?BP: 126/70  ?Pulse: 95  ?SpO2: 95%  ?Weight: 171 lb 12.8 oz (77.9 kg)  ? ? ? [facility] Eval - 08/12/21 1200   ? ?  ? [facility] "PREP" Location  ? [facility]PREP[facility]   ?  ? Referral   ? Program Start Date 08/12/21   Program end date  ?  ? Measurement  ? Waist Circumference 36 inches   ? Hip Circumference 44 inches   ? Body fat 39.2 percent   ?  ? Mobility and Daily Activities  ? I find it easy to walk up or down two or more flights of stairs. 1   ? I have no trouble taking out the trash. 4   ? I do housework such as vacuuming and dusting on my own without difficulty. 2   ? I can easily lift a gallon of milk (8lbs). 4   ? I can easily walk a mile. 1   ? I have no trouble reaching into high cupboards or reaching down to pick up something from the floor. 3   ? I do not have trouble doing out-door work such as Armed forces logistics/support/administrative officer, raking leaves, or gardening. 4   ?  ? Mobility and Daily Activities  ? I feel younger than my age. 4   ? I feel independent. 4   ? I feel energetic. 2   ? I live an active life.  3   ? I feel strong. 3   ? I feel healthy. 3   ? I feel active as other people my age. 2   ?  ? How fit and strong are you.  ? Fit and Strong Total Score 40   ? ?  ?  ? ?  ? ?Past Medical History:  ?Diagnosis Date  ? Anxiety   ? husband also cancer pt  ? Cancer [facility])   ? lung  ? Complication of anesthesia   ? bronchospasms post-op (pulmonary did consult but pt was discharged later that day)  With last surg. she was given albuterol inhaler & had steroid with surg.    ? Congenital renal atrophy   ? left kidney, one spontaneous stone passed   ? COPD (chronic obstructive pulmonary disease) ([place])   ? Coronary artery disease   ? Degenerative arthritis   ? spine, hands & knees   ? GERD (gastroesophageal reflux disease)   ? History of  migraine   ? Hypertension   ? h/o PVC- asymptomatic   ? Memory disorder 03/29/2017  ? Migraine equivalent 11/09/2012  ? atypical - loss of vision, transient, takes Verapimil for migraine & BP control   ? PONV (postoperative nausea and vomiting)   ? PVC (premature ventricular contraction)   ? Shortness of breath dyspnea   ? with exertion  ? Thyroid nodule   ? benign by biopsy  ? Varicose veins   ? left leg  ? Visual disturbance   ? Episodic  ? Vitamin D deficiency   ? Wears glasses   ? ?Past Surgical History:  ?Procedure Laterality Date  ? CARPAL TUNNEL RELEASE Left   ? CESAREAN SECTION    ? COLONOSCOPY W/ BIOPSIES AND POLYPECTOMY    ? DILATION AND CURETTAGE OF UTERUS    ? TONSILLECTOMY    ? VARICOSE  VEIN SURGERY    ? left  ? VIDEO ASSISTED THORACOSCOPY (VATS)/ LOBECTOMY Right 07/23/2014  ? Procedure: VIDEO ASSISTED THORACOSCOPY (VATS)/ LOBECTOMY;  Surgeon: Ivin Poot, MD;  Location: [facility];  Service: Thoracic;  Laterality: Right;  ? VIDEO BRONCHOSCOPY N/A 07/23/2014  ? Procedure: VIDEO BRONCHOSCOPY;  Surgeon: Ivin Poot, MD;  Location: [facility];  Service: Thoracic;  Laterality: N/A;  ? VIDEO BRONCHOSCOPY WITH ENDOBRONCHIAL ULTRASOUND Right 04/17/2014  ? Procedure: VIDEO BRONCHOSCOPY WITH ENDOBRONCHIAL ULTRASOUND;  Surgeon: Collene Gobble, MD;  Location: [facility];  Service: Thoracic;  Laterality: Right;  ? VIDEO BRONCHOSCOPY WITH ENDOBRONCHIAL ULTRASOUND N/A 07/08/2014  ? Procedure: VIDEO BRONCHOSCOPY WITH ENDOBRONCHIAL ULTRASOUND;  Surgeon: Ivin Poot, MD;  Location: [facility];  Service: Thoracic;  Laterality: N/A;  ? ?Social History  ? ?Tobacco Use  ?Smoking Status Former  ? Packs/day: 0.50  ? Years: 40.00  ? Pack years: 20.00  ? Types: Cigarettes  ? Quit date: 03/17/2013  ? Years since quitting: 8.4  ?Smokeless Tobacco Never  ?PREP Class final assessment visit: ?BP 05/20/21: 142/82 ?08/12/21: 126/70 ?Education sessions attended: 10 ?Workout sessions attended: 10 ?[place] ?08/12/2021, 12:38 PM ? ? ?

## 2021-08-13 ENCOUNTER — Other Ambulatory Visit: Payer: Self-pay

## 2021-08-13 MED ORDER — ROSUVASTATIN CALCIUM 10 MG PO TABS: 10.0000 mg | ORAL_TABLET | Freq: Every day | ORAL | 0 refills | Status: DC

## 2021-08-14 DIAGNOSIS — G4733 Obstructive sleep apnea (adult) (pediatric): Secondary | ICD-10-CM | POA: Diagnosis not present

## 2021-08-14 DIAGNOSIS — M25552 Pain in left hip: Secondary | ICD-10-CM | POA: Diagnosis not present

## 2021-08-14 DIAGNOSIS — I25118 Atherosclerotic heart disease of native coronary artery with other forms of angina pectoris: Secondary | ICD-10-CM | POA: Diagnosis not present

## 2021-08-14 DIAGNOSIS — C3490 Malignant neoplasm of unspecified part of unspecified bronchus or lung: Secondary | ICD-10-CM | POA: Diagnosis not present

## 2021-08-14 DIAGNOSIS — R351 Nocturia: Secondary | ICD-10-CM | POA: Diagnosis not present

## 2021-08-20 DIAGNOSIS — N3941 Urge incontinence: Secondary | ICD-10-CM | POA: Diagnosis not present

## 2021-08-20 DIAGNOSIS — R351 Nocturia: Secondary | ICD-10-CM | POA: Diagnosis not present

## 2021-08-28 DIAGNOSIS — S8262XA Displaced fracture of lateral malleolus of left fibula, initial encounter for closed fracture: Secondary | ICD-10-CM | POA: Diagnosis not present

## 2021-09-03 DIAGNOSIS — R351 Nocturia: Secondary | ICD-10-CM | POA: Diagnosis not present

## 2021-09-03 DIAGNOSIS — N3941 Urge incontinence: Secondary | ICD-10-CM | POA: Diagnosis not present

## 2021-09-03 DIAGNOSIS — R350 Frequency of micturition: Secondary | ICD-10-CM | POA: Diagnosis not present

## 2021-09-09 ENCOUNTER — Ambulatory Visit: Payer: Medicare Other | Admitting: Interventional Cardiology

## 2021-09-11 ENCOUNTER — Other Ambulatory Visit: Payer: Self-pay | Admitting: Orthopaedic Surgery

## 2021-09-11 ENCOUNTER — Ambulatory Visit
Admission: RE | Admit: 2021-09-11 | Discharge: 2021-09-11 | Disposition: A | Discharge status: Home / Self Care | Payer: Medicare Other | Source: Ambulatory Visit | Attending: Orthopaedic Surgery | Admitting: Orthopaedic Surgery

## 2021-09-11 DIAGNOSIS — S8262XA Displaced fracture of lateral malleolus of left fibula, initial encounter for closed fracture: Secondary | ICD-10-CM | POA: Diagnosis not present

## 2021-09-11 DIAGNOSIS — S93492A Sprain of other ligament of left ankle, initial encounter: Secondary | ICD-10-CM | POA: Diagnosis not present

## 2021-09-11 DIAGNOSIS — S93409A Sprain of unspecified ligament of unspecified ankle, initial encounter: Secondary | ICD-10-CM

## 2021-09-11 HISTORY — DX: Sprain of unspecified ligament of unspecified ankle, initial encounter: S93.409A

## 2021-10-01 ENCOUNTER — Ambulatory Visit: Payer: Medicare Other | Admitting: Neurology

## 2021-10-08 ENCOUNTER — Encounter: Payer: Self-pay | Admitting: Neurology

## 2021-10-08 ENCOUNTER — Ambulatory Visit (INDEPENDENT_AMBULATORY_CARE_PROVIDER_SITE_OTHER): Payer: Medicare Other | Admitting: Neurology

## 2021-10-08 VITALS — BP 148/74 | HR 92 | Ht 65.5 in | Wt 174.5 lb

## 2021-10-08 DIAGNOSIS — I25118 Atherosclerotic heart disease of native coronary artery with other forms of angina pectoris: Secondary | ICD-10-CM | POA: Diagnosis not present

## 2021-10-08 DIAGNOSIS — G3184 Mild cognitive impairment, so stated: Secondary | ICD-10-CM | POA: Diagnosis not present

## 2021-10-08 DIAGNOSIS — G4700 Insomnia, unspecified: Secondary | ICD-10-CM

## 2021-10-08 DIAGNOSIS — R41842 Visuospatial deficit: Secondary | ICD-10-CM | POA: Diagnosis not present

## 2021-10-08 HISTORY — DX: Mild cognitive impairment of uncertain or unknown etiology: G31.84

## 2021-10-08 HISTORY — DX: Insomnia, unspecified: G47.00

## 2021-10-08 MED ORDER — BELSOMRA 10 MG PO TABS: 10.0000 mg | ORAL_TABLET | Freq: Every evening | ORAL | 2 refills | Status: DC | PRN

## 2021-10-08 NOTE — Progress Notes (Signed)
?  ? ? ?SLEEP MEDICINE CLINIC ?  ? ?Provider:  Larey Seat, MD  ?Primary Care Physician:  [name], MD ?[address] ?[place] [address]  ? ?  ?Referring Provider: [name], Md ?[address] ?[place],   [address]  ?  ?  ?    ?Chief Complaint according to patient   ?Patient presents with:  ?  ? New Patient (Initial Visit)  ?     ?  ?  ?HISTORY OF PRESENT ILLNESS:  ? ? ?[name] [name] is a 66 y.o.Caucasian female patient seen here as a referral on 10/08/2021 from Dr Jannifer Franklin, for a sleep evaluation. Now she is here to address sleep and memory issues. ?Dr .Jannifer Franklin did MMSE and MRI and had her followed by NP.  ?She reports slower word finding, naming, and some spatial orientation difficulties with finding the car in a car park. ?Today is 08 Oct 2021 and I am meeting today with [name] [name] to discuss the results of her sleep study which had been performed in February of this year on 2-6 2023 and referral from Dr. Jannifer Franklin.  The patient had reported nonrestorative nonrefreshing sleep.  Difficulties to fall asleep and to stay asleep.  She had an AHI of 8.5/h which is very mild sleep apnea only during REM sleep was AHI 21.1/h.  In supine sleep there were more apnea spent in nonsupine.  She had only 4 minutes of hypoxia which is insignificant.  She did have frequent periodic limb movements but this did not lead to arousals.  There were also no REM no limb movements during REM sleep which is a good sign.  Normal sinus rhythm was seen on her cardiac channel.  She had 3 bathroom breaks which is a higher number than usually seen.  And she had a lot of spontaneous arousals with either can indicate chronic insomnia but can also be related to underlying pain or anxiety or depression.  The treatment of such mild apnea would be considered optional.  If she would want treatment I would recommend CPAP but we meet today to discuss that further. ?She opted out of CPAP.  ? Her memory will also be tested  today:  ?MOCA:  ? ?  10/08/2021  ?  2:45 PM  ?Montreal Cognitive Assessment   ?Visuospatial/ Executive (0/5) 2  ?Naming (0/3) 2  ?Attention: Read list of digits (0/2) 2  ?Attention: Read list of letters (0/1) 1  ?Attention: Serial 7 subtraction starting at 100 (0/3) 3  ?Language: Repeat phrase (0/2) 2  ?Language : Fluency (0/1) 1  ?Abstraction (0/2) 2  ?Delayed Recall (0/5) 3  ?Orientation (0/6) 5  ?Total 23  ? ? ? ? ?06-24-2021: ? Referral from Dr. Jannifer Franklin, who followed her for memory disorder associated with fatigue and frequent awakening. Wants evaluation  to see if her forgetfulness is coming from possible sleep apnea.  ?Pt is not aware of any snoring or apneic episodes.  Dr .Jannifer Franklin did MMSE and MRI and had her followed by NP.  ?She reports slower word finding, naming, and some spatial orientation difficulties with finding the car in a car park. ? ?Sleep relevant medical history: Her sleep is not refreshing, fragmented, tossing and turning. Initiating sleep is difficult and insomnia also following sleep interruption after nocturia 2-4 times at night.  ?She has had a tonsillectomy and adenoid ectomy- age 66, DDD cervical spine, enough degeneration affecting  her barium swallow test- no osteoporosis. Thyroid biopsy.  ?Former smoker, quit  2014. After her husband passed she used low dose ativan and did not like the hang- over effect.  ?[name] [name]   has a past medical history of Anxiety, Cancer ([place]), Complication of anesthesia, Congenital renal atrophy, COPD (chronic obstructive pulmonary disease) ([place]), Coronary artery disease, Degenerative arthritis, GERD (gastroesophageal reflux disease), History of migraine, Hypertension, Memory disorder (03/29/2017), Migraine equivalent (11/09/2012), PONV (postoperative nausea and vomiting), PVC (premature ventricular contraction), Shortness of breath dyspnea, Thyroid nodule, Varicose veins, Visual disturbance, Vitamin D deficiency, and Wears glasses. ?  ?Family medical  /sleep history: non diagnosed family member on CPAP with OSA, many people have insomnia, no sleep walkers.  2 sisters, a brother, mother with insomnia.  ?  ?Social history:  Patient is retired from Therapist, sports- former Geophysical data processor- and lives in a household alone. Family status is widowed 4/ 2018 , with adult children. She has cats and dogs. A daughter has moved back in.  ? The patient used to work in shifts( Presenter, broadcasting,) ?Tobacco use quit 2014.   ?ETOH use - rare ,  ?Caffeine intake in form of Coffee( 1 cup in AM) Soda( /) Tea ( /) or energy drinks. ?Regular exercise : none .   ?Hobbies : too tired  ? ? ?Sleep habits are as follows: The patient's dinner time is between 6-8 PM. The patient goes to bed at 11-12 PM and continues to sleep for intervals of 1-3  hours, wakes for many bathroom breaks, the first time at 1 AM.   ?The preferred sleep position is laterally, with the support of 1-2 pillows.  ?Dreams are reportedly rare.  ?8-9  AM is the usual rise time. The patient wakes up spontaneously or by puppy alarm.  ?She reports not feeling refreshed or restored in AM, with symptoms such as dry mouth, no morning headaches, and residual fatigue.  Verapamil controlled her migraines.  ?Naps are taken infrequently, lasting from 60 to 120 minutes . ?  ?Review of Systems: ?Out of a complete 14 system review, the patient complains of only the following symptoms, and all other reviewed systems are negative.:  ?Fatigue, sleepiness , snoring, fragmented sleep, Insomnia NOCTURIA, no RLS. ? ? ?  ?How likely are you to doze in the following situations: ?0 = not likely, 1 = slight chance, 2 = moderate chance, 3 = high chance ?  ?Sitting and Reading? ?Watching Television? ?Sitting inactive in a public place (theater or meeting)? ?As a passenger in a car for an hour without a break? ?Lying down in the afternoon when circumstances permit? ?Sitting and talking to someone? ?Sitting quietly after lunch without alcohol? ?In a car, while  stopped for a few minutes in traffic? ?  ?Total = /7 24 points  ? FSS endorsed at 41/ 63 points.  ? ?Social History  ? ?Socioeconomic History  ? Marital status: Widowed  ?  Spouse name: Not on file  ? Number of children: 1  ? Years of education: 45  ? Highest education level: Not on file  ?Occupational History  ? Occupation: Nurse  ?  Employer: [employer]  ?Tobacco Use  ? Smoking status: Former  ?  Packs/day: 0.50  ?  Years: 40.00  ?  Pack years: 20.00  ?  Types: Cigarettes  ?  Quit date: 03/17/2013  ?  Years since quitting: 8.5  ? Smokeless tobacco: Never  ?Vaping Use  ? Vaping Use: Never used  ?Substance and Sexual Activity  ? Alcohol use: Yes  ?  Alcohol/week:  0.0 standard drinks  ?  Comment: " rare"  ? Drug use: No  ? Sexual activity: Not on file  ?Other Topics Concern  ? Not on file  ?Social History Narrative  ? Lives alone. Husband passed away 10/02/16  ? Caffeine use: 1-2 cups coffee per day  ? Right handed   ? ?Social Determinants of Health  ? ?Financial Resource Strain: Not on file  ?Food Insecurity: Not on file  ?Transportation Needs: Not on file  ?Physical Activity: Not on file  ?Stress: Not on file  ?Social Connections: Not on file  ? ? ?Family History  ?Problem Relation Age of Onset  ? Cancer Mother   ?     cancer of the small intestines  ? Hypertension Father   ? Atrial fibrillation Father   ? Diverticulitis Father   ? Hypertension Sister   ? Hypertension Sister   ? Headache Sister   ? Hypertension Brother   ? Diverticulitis Brother   ? ? ?Past Medical History:  ?Diagnosis Date  ? Anxiety   ? husband also cancer pt  ? Cancer [facility])   ? lung  ? Complication of anesthesia   ? bronchospasms post-op (pulmonary did consult but pt was discharged later that day)  With last surg. she was given albuterol inhaler & had steroid with surg.    ? Congenital renal atrophy   ? left kidney, one spontaneous stone passed   ? COPD (chronic obstructive pulmonary disease) ([place])   ? Coronary artery disease   ?  Degenerative arthritis   ? spine, hands & knees   ? GERD (gastroesophageal reflux disease)   ? History of migraine   ? Hypertension   ? h/o PVC- asymptomatic   ? Memory disorder 03/29/2017  ? Migraine equivalent 6/5/2

## 2021-10-08 NOTE — Patient Instructions (Signed)
Suvorexant Tablets ?What is this medication? ?SUVOREXANT (SOO voe REX ant) treats insomnia. It helps you go to sleep faster and stay asleep through the night. ?This medicine may be used for other purposes; ask your health care provider or pharmacist if you have questions. ?COMMON BRAND NAME(S): Belsomra ?What should I tell my care team before I take this medication? ?They need to know if you have any of these conditions: ?Depression ?History of substance abuse or addiction ?History of a sudden onset of muscle weakness (cataplexy) ?History of falling asleep often at unexpected times (narcolepsy) ?If you often drink alcohol ?Liver disease ?Lung or breathing disease ?Sleep apnea ?Sleep-walking, driving, eating or other activity while not fully awake after taking a sleep medication ?Suicidal thoughts, plans, or attempt; a previous suicide attempt by you or a family member ?An unusual or allergic reaction to suvorexant, other medications, foods, dyes, or preservatives ?Pregnant or trying to get pregnant ?Breast-feeding ?How should I use this medication? ?Take this medication by mouth with water 30 minutes before going to bed. Follow the directions on the prescription label. It is better to take this medication on an empty stomach. Do not take your medication more often than directed. ?A special MedGuide will be given to you by the pharmacist with each prescription and refill. Be sure to read this information carefully each time. ?Talk to your care team about the use of this medication in children. Special care may be needed. ?Overdosage: If you think you have taken too much of this medicine contact a poison control center or emergency room at once. ?NOTE: This medicine is only for you. Do not share this medicine with others. ?What if I miss a dose? ?This does not apply. This medication should only be taken as directed before going to sleep. Do not take double or extra doses. ?What may interact with this  medication? ?Alcohol ?Antihistamines for allergy, cough, or cold ?Certain antibiotics, such as erythromycin or clarithromycin ?Certain antivirals for HIV or hepatitis ?Certain medications for fungal infections, such as itraconazole, ketoconazole, posaconazole ?Certain medications for mental health conditions ?Certain medications for seizures, such as carbamazepine, phenytoin, phenobarbital ?Conivaptan ?Diltiazem ?General anesthetics, such as halothane, isoflurane, methoxyflurane, propofol ?Grapefruit juice ?Medications that relax muscles for surgery ?Opioid medications for pain ?Other medications for sleep ?Rifampin ?Viburnum ?Verapamil ?This list may not describe all possible interactions. Give your health care provider a list of all the medicines, herbs, non-prescription drugs, or dietary supplements you use. Also tell them if you smoke, drink alcohol, or use illegal drugs. Some items may interact with your medicine. ?What should I watch for while using this medication? ?Visit your care team for regular checks on your progress. Keep a regular sleep schedule by going to bed at about the same time each night. Avoid caffeine-containing drinks in the evening hours. Talk to your care team if your insomnia worsens or is not better within 7 to 10 days. ?After taking this medication, you may get up out of bed and do an activity that you do not know you are doing. The next morning, you may have no memory of this. Activities include driving a car ("sleep-driving"), making and eating food, talking on the phone, sexual activity, and sleep-walking. Serious injuries have occurred. Stop the medication and call your care team right away if you find out you have done any of these activities. Do not take this medication if you have used alcohol that evening. Do not take it if you have taken another  medication for sleep. The risk of doing these sleep-related activities is higher. ?Do not take this medication unless you are  able to stay in bed for a full night (7 to 8 hours) before you must be active again. Tell your care team if you will need to perform activities requiring full alertness, such as driving, the next day. You may have a decrease in mental alertness the day after use, even if you feel that you are fully awake. Do not stand or sit up quickly after taking this medication, especially if you are an older patient. This reduces the risk of dizzy or fainting spells. ?If you or your family notice any changes in your moods or behavior, such as new or worsening depression, thoughts of harming yourself, anxiety, other unusual or disturbing thoughts, or memory loss, call your care team right away. ?After you stop taking this medication, you may have trouble falling asleep. This is called rebound insomnia. This problem usually goes away on its own after 1 or 2 nights. ?What side effects may I notice from receiving this medication? ?Side effects that you should report to your care team as soon as possible: ?Allergic reactions--skin rash, itching, hives, swelling of the face, lips, tongue, or throat ?CNS depression--slow or shallow breathing, shortness of breath, feeling faint, dizziness, confusion, trouble staying awake ?Mood and behavior changes--anxiety, nervousness, confusion, hallucinations, irritability, hostility, thoughts of suicide or self-harm, worsening mood, feelings of depression ?Sudden and temporary muscle weakness ?Unable to move or speak for several minutes upon waking or going to sleep ?Unusual sleep behaviors or activities you do not remember, such as driving, eating, or sexual activity ?Side effects that usually do not require medical attention (report these to your care team if they continue or are bothersome): ?Drowsiness the day after use ?Vivid dreams or nightmares ?This list may not describe all possible side effects. Call your doctor for medical advice about side effects. You may report side effects to FDA at  1-800-FDA-1088. ?Where should I keep my medication? ?Keep out of the reach of children and pets. This medication can be abused. Keep it in a safe place to protect it from theft. Do not share it with anyone. It is only for you. Selling or giving away this medication is dangerous and against the law. ?Store between 20 and 25 degrees C (68 and 77 degrees F). Protect from light and moisture. Keep the container tightly closed. Get rid of any unused medication after the expiration date. ?This medication may cause harm and death if it is taken by other adults, children, or pets. It is important to get rid of the medication as soon as you no longer need it or it is expired. You can do this in two ways: ?Take the medication to a medication take-back program. Check with your pharmacy or law enforcement to find a location. ?If you cannot return the medication, check the label or package insert to see if the medication should be thrown out in the garbage or flushed down the toilet. If you are not sure, ask your care team. If it is safe to put it in the trash, take the medication out of the container. Mix the medication with cat litter, dirt, coffee grounds, or other unwanted substance. Seal the mixture in a bag or container. Put it in the trash. ?NOTE: This sheet is a summary. It may not cover all possible information. If you have questions about this medicine, talk to your doctor, pharmacist, or health care provider. ??  2023 Elsevier/Gold Standard (2021-01-15 00:00:00) ?Insomnia ?Insomnia is a sleep disorder that makes it difficult to fall asleep or stay asleep. Insomnia can cause fatigue, low energy, difficulty concentrating, mood swings, and poor performance at work or school. ?There are three different ways to classify insomnia: ?Difficulty falling asleep. ?Difficulty staying asleep. ?Waking up too early in the morning. ?Any type of insomnia can be long-term (chronic) or short-term (acute). Both are common. Short-term  insomnia usually lasts for 3 months or less. Chronic insomnia occurs at least three times a week for longer than 3 months. ?What are the causes? ?Insomnia may be caused by another condition, situation, or substance, such as:

## 2021-10-13 ENCOUNTER — Encounter: Payer: Self-pay | Admitting: Psychology

## 2021-10-13 ENCOUNTER — Telehealth: Payer: Self-pay | Admitting: Neurology

## 2021-10-13 DIAGNOSIS — M25572 Pain in left ankle and joints of left foot: Secondary | ICD-10-CM | POA: Diagnosis not present

## 2021-10-13 DIAGNOSIS — S93492A Sprain of other ligament of left ankle, initial encounter: Secondary | ICD-10-CM | POA: Diagnosis not present

## 2021-10-13 NOTE — Telephone Encounter (Signed)
LVM returning pt call. ? ? I do not see mention of stroke in office note/MRI results from 07/08/21 MRI brain (Normal brain MRI for age. No findings explaining cognitive difficulties. PENUMALLI, MD) ? ?Unsure if she is asking about a medication she should be taking? ? ?Looks like appt with [facility] is for the neurocognitive testing that Dr. Brett Fairy ordered at last visit. ?

## 2021-10-13 NOTE — Telephone Encounter (Signed)
Pt have questions about some things discussed a office visit; if had a stroke, how long has it been; if want to take every night and do you want to take for 30 days? After 30 days do want to take as needed. [facility] scheduled 07/16/22 and put on the waitlist. Would like a call from the nurse. ?

## 2021-10-13 NOTE — Telephone Encounter (Signed)
Pt returned call. Please call back when available. 

## 2021-10-13 NOTE — Telephone Encounter (Signed)
Called the patient back and basically she was recapping the visit from may 4th and clarifying the steps.  ?She was written belsomra and is she thought that Dr Brett Fairy wanted her to take it every night for one month and into the next month and see how she does with it. She states that she thought she told her it can take a month before effectively works. The script is written take at bedtime as needed. There are no clear instructions that state that she must take it every night in the notes but I advised that if that is what was discussed that is fine to do since she has written for 30 tablets with 2 refills. The patient would like to clarify. Advised I would.  ? ?She wanted her to know she is scheduled [facility] for neruo psych testing but isnt scheduled until 07/2022. Advised I would make her aware.  ?

## 2021-10-13 NOTE — Telephone Encounter (Signed)
Called  and advised I clarified with Dr Brett Fairy that she did want her to do it once a night every night for at least the first month. Pt verbalized understanding. ? ?

## 2021-10-19 DIAGNOSIS — Z01419 Encounter for gynecological examination (general) (routine) without abnormal findings: Secondary | ICD-10-CM | POA: Diagnosis not present

## 2021-10-19 DIAGNOSIS — Z124 Encounter for screening for malignant neoplasm of cervix: Secondary | ICD-10-CM | POA: Diagnosis not present

## 2021-10-19 DIAGNOSIS — Z0142 Encounter for cervical smear to confirm findings of recent normal smear following initial abnormal smear: Secondary | ICD-10-CM | POA: Diagnosis not present

## 2021-10-19 DIAGNOSIS — Z6828 Body mass index (BMI) 28.0-28.9, adult: Secondary | ICD-10-CM | POA: Diagnosis not present

## 2021-10-19 DIAGNOSIS — Z01411 Encounter for gynecological examination (general) (routine) with abnormal findings: Secondary | ICD-10-CM | POA: Diagnosis not present

## 2021-10-19 DIAGNOSIS — Z780 Asymptomatic menopausal state: Secondary | ICD-10-CM | POA: Diagnosis not present

## 2021-10-21 DIAGNOSIS — M5416 Radiculopathy, lumbar region: Secondary | ICD-10-CM | POA: Diagnosis not present

## 2021-10-21 DIAGNOSIS — M79605 Pain in left leg: Secondary | ICD-10-CM | POA: Diagnosis not present

## 2021-10-21 DIAGNOSIS — M25551 Pain in right hip: Secondary | ICD-10-CM | POA: Diagnosis not present

## 2021-10-21 DIAGNOSIS — M25552 Pain in left hip: Secondary | ICD-10-CM | POA: Diagnosis not present

## 2021-11-09 ENCOUNTER — Other Ambulatory Visit: Payer: Self-pay | Admitting: Interventional Cardiology

## 2021-11-09 DIAGNOSIS — E538 Deficiency of other specified B group vitamins: Secondary | ICD-10-CM | POA: Diagnosis not present

## 2021-11-11 DIAGNOSIS — I10 Essential (primary) hypertension: Secondary | ICD-10-CM | POA: Diagnosis not present

## 2021-11-11 DIAGNOSIS — F419 Anxiety disorder, unspecified: Secondary | ICD-10-CM | POA: Diagnosis not present

## 2021-11-11 DIAGNOSIS — R7989 Other specified abnormal findings of blood chemistry: Secondary | ICD-10-CM | POA: Diagnosis not present

## 2021-11-11 DIAGNOSIS — E559 Vitamin D deficiency, unspecified: Secondary | ICD-10-CM | POA: Diagnosis not present

## 2021-11-11 DIAGNOSIS — E785 Hyperlipidemia, unspecified: Secondary | ICD-10-CM | POA: Diagnosis not present

## 2021-11-13 DIAGNOSIS — N3941 Urge incontinence: Secondary | ICD-10-CM | POA: Diagnosis not present

## 2021-11-30 DIAGNOSIS — E538 Deficiency of other specified B group vitamins: Secondary | ICD-10-CM | POA: Diagnosis not present

## 2021-11-30 DIAGNOSIS — I10 Essential (primary) hypertension: Secondary | ICD-10-CM | POA: Diagnosis not present

## 2021-11-30 DIAGNOSIS — G4700 Insomnia, unspecified: Secondary | ICD-10-CM | POA: Diagnosis not present

## 2021-11-30 DIAGNOSIS — R413 Other amnesia: Secondary | ICD-10-CM | POA: Diagnosis not present

## 2021-11-30 DIAGNOSIS — I5020 Unspecified systolic (congestive) heart failure: Secondary | ICD-10-CM | POA: Diagnosis not present

## 2021-11-30 DIAGNOSIS — C3490 Malignant neoplasm of unspecified part of unspecified bronchus or lung: Secondary | ICD-10-CM | POA: Diagnosis not present

## 2021-11-30 DIAGNOSIS — R3589 Other polyuria: Secondary | ICD-10-CM | POA: Diagnosis not present

## 2021-11-30 DIAGNOSIS — Z0000 Encounter for general adult medical examination without abnormal findings: Secondary | ICD-10-CM | POA: Diagnosis not present

## 2021-11-30 DIAGNOSIS — Z1331 Encounter for screening for depression: Secondary | ICD-10-CM | POA: Diagnosis not present

## 2021-11-30 DIAGNOSIS — Z1339 Encounter for screening examination for other mental health and behavioral disorders: Secondary | ICD-10-CM | POA: Diagnosis not present

## 2021-11-30 DIAGNOSIS — G4733 Obstructive sleep apnea (adult) (pediatric): Secondary | ICD-10-CM | POA: Diagnosis not present

## 2021-11-30 DIAGNOSIS — I2584 Coronary atherosclerosis due to calcified coronary lesion: Secondary | ICD-10-CM | POA: Diagnosis not present

## 2021-11-30 DIAGNOSIS — J449 Chronic obstructive pulmonary disease, unspecified: Secondary | ICD-10-CM | POA: Diagnosis not present

## 2021-11-30 DIAGNOSIS — G43809 Other migraine, not intractable, without status migrainosus: Secondary | ICD-10-CM | POA: Diagnosis not present

## 2021-11-30 DIAGNOSIS — M8589 Other specified disorders of bone density and structure, multiple sites: Secondary | ICD-10-CM | POA: Diagnosis not present

## 2021-12-01 DIAGNOSIS — M25572 Pain in left ankle and joints of left foot: Secondary | ICD-10-CM | POA: Diagnosis not present

## 2021-12-01 DIAGNOSIS — S93492A Sprain of other ligament of left ankle, initial encounter: Secondary | ICD-10-CM | POA: Diagnosis not present

## 2021-12-02 DIAGNOSIS — M25552 Pain in left hip: Secondary | ICD-10-CM | POA: Diagnosis not present

## 2021-12-02 DIAGNOSIS — M5416 Radiculopathy, lumbar region: Secondary | ICD-10-CM | POA: Diagnosis not present

## 2021-12-02 DIAGNOSIS — M25551 Pain in right hip: Secondary | ICD-10-CM | POA: Diagnosis not present

## 2021-12-02 DIAGNOSIS — M79605 Pain in left leg: Secondary | ICD-10-CM | POA: Diagnosis not present

## 2021-12-03 DIAGNOSIS — R350 Frequency of micturition: Secondary | ICD-10-CM | POA: Diagnosis not present

## 2021-12-03 DIAGNOSIS — M13849 Other specified arthritis, unspecified hand: Secondary | ICD-10-CM | POA: Diagnosis not present

## 2021-12-03 DIAGNOSIS — M1811 Unilateral primary osteoarthritis of first carpometacarpal joint, right hand: Secondary | ICD-10-CM | POA: Diagnosis not present

## 2021-12-03 DIAGNOSIS — R52 Pain, unspecified: Secondary | ICD-10-CM | POA: Diagnosis not present

## 2021-12-03 DIAGNOSIS — M13841 Other specified arthritis, right hand: Secondary | ICD-10-CM | POA: Diagnosis not present

## 2021-12-03 DIAGNOSIS — M13842 Other specified arthritis, left hand: Secondary | ICD-10-CM | POA: Diagnosis not present

## 2021-12-03 DIAGNOSIS — M659 Synovitis and tenosynovitis, unspecified: Secondary | ICD-10-CM | POA: Diagnosis not present

## 2021-12-03 DIAGNOSIS — N3941 Urge incontinence: Secondary | ICD-10-CM | POA: Diagnosis not present

## 2021-12-03 DIAGNOSIS — M180 Bilateral primary osteoarthritis of first carpometacarpal joints: Secondary | ICD-10-CM | POA: Diagnosis not present

## 2021-12-03 DIAGNOSIS — R351 Nocturia: Secondary | ICD-10-CM | POA: Diagnosis not present

## 2021-12-04 DIAGNOSIS — M19049 Primary osteoarthritis, unspecified hand: Secondary | ICD-10-CM | POA: Insufficient documentation

## 2021-12-04 DIAGNOSIS — M65941 Unspecified synovitis and tenosynovitis, right hand: Secondary | ICD-10-CM

## 2021-12-04 DIAGNOSIS — M659 Synovitis and tenosynovitis, unspecified: Secondary | ICD-10-CM | POA: Insufficient documentation

## 2021-12-04 HISTORY — DX: Unspecified synovitis and tenosynovitis, right hand: M65.941

## 2021-12-04 HISTORY — DX: Primary osteoarthritis, unspecified hand: M19.049

## 2021-12-09 ENCOUNTER — Ambulatory Visit: Payer: Medicare Other

## 2021-12-09 ENCOUNTER — Ambulatory Visit (INDEPENDENT_AMBULATORY_CARE_PROVIDER_SITE_OTHER): Payer: Medicare Other

## 2021-12-09 ENCOUNTER — Telehealth: Payer: Self-pay | Admitting: Emergency Medicine

## 2021-12-09 ENCOUNTER — Ambulatory Visit (INDEPENDENT_AMBULATORY_CARE_PROVIDER_SITE_OTHER): Payer: Medicare Other | Admitting: Internal Medicine

## 2021-12-09 ENCOUNTER — Encounter: Payer: Self-pay | Admitting: Internal Medicine

## 2021-12-09 VITALS — BP 88/58 | HR 100 | Temp 97.9°F | Ht 65.5 in | Wt 170.6 lb

## 2021-12-09 DIAGNOSIS — R059 Cough, unspecified: Secondary | ICD-10-CM | POA: Diagnosis not present

## 2021-12-09 DIAGNOSIS — R0602 Shortness of breath: Secondary | ICD-10-CM

## 2021-12-09 DIAGNOSIS — J441 Chronic obstructive pulmonary disease with (acute) exacerbation: Secondary | ICD-10-CM | POA: Diagnosis not present

## 2021-12-09 DIAGNOSIS — R051 Acute cough: Secondary | ICD-10-CM

## 2021-12-09 MED ORDER — PREDNISONE 20 MG PO TABS: 40.0000 mg | ORAL_TABLET | Freq: Every day | ORAL | 0 refills | Status: DC

## 2021-12-09 MED ORDER — ALBUTEROL SULFATE (2.5 MG/3ML) 0.083% IN NEBU
2.5000 mg | INHALATION_SOLUTION | Freq: Four times a day (QID) | RESPIRATORY_TRACT | Status: AC
  Administered 2021-12-09: 2.5 mg via RESPIRATORY_TRACT

## 2021-12-09 NOTE — Progress Notes (Signed)
[name] [name]    [id number]    1956-03-11  Primary Care Physician:[name], MD Date of Appointment: 12/09/2021 Established Patient Visit  Chief complaint:   Chief Complaint  Patient presents with   Follow-up    Pt here on sick visit, having symptoms since 7/1, no fever but SOB, productive cough. Reported taking 2 COVID test and all neg. Nothing seems to improve the symptoms.     HPI: [name] [name] is a 66 y.o. woman with stage 2B lung cancer s/ Right partial lobectomy.   Interval Updates: Here for sick visit. Having some sinus drainage with thin clear mucus and now has thickened.   She is on albuterol as needed - she has been more short of breath and now is short of breath at rest.   Does not get sick frequently. Took covid test which was negative x 2.   Has noticed some wheezing which has worsened at night.   No fever. Keeping up with fluids. Appetite is ok. Trying not to use cough suppression to suppress the cough.    I have reviewed the patient's family social and past medical history and updated as appropriate.   Past Medical History:  Diagnosis Date   Anxiety    husband also cancer pt   Cancer ([place])    lung   Complication of anesthesia    bronchospasms post-op (pulmonary did consult but pt was discharged later that day)  With last surg. she was given albuterol inhaler & had steroid with surg.     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, transient, takes Verapimil for migraine & BP control    PONV (postoperative nausea and vomiting)    PVC (premature ventricular contraction)     Shortness of breath dyspnea    with exertion   Thyroid nodule    benign by biopsy   Varicose veins    left leg   Visual disturbance    Episodic   Vitamin D deficiency    Wears glasses     Past Surgical History:  Procedure Laterality Date   CARPAL TUNNEL RELEASE Left    CESAREAN SECTION     COLONOSCOPY W/ BIOPSIES AND POLYPECTOMY     DILATION AND CURETTAGE OF UTERUS     TONSILLECTOMY     VARICOSE VEIN SURGERY     left   VIDEO ASSISTED THORACOSCOPY (VATS)/ LOBECTOMY Right 07/23/2014   Procedure: VIDEO ASSISTED THORACOSCOPY (VATS)/ LOBECTOMY;  Surgeon: Ivin Poot, MD;  Location: [place];  Service: Thoracic;  Laterality: Right;   VIDEO BRONCHOSCOPY N/A 07/23/2014   Procedure: VIDEO BRONCHOSCOPY;  Surgeon: Ivin Poot, MD;  Location: [place];  Service: Thoracic;  Laterality: N/A;   VIDEO BRONCHOSCOPY WITH ENDOBRONCHIAL ULTRASOUND Right 04/17/2014   Procedure: VIDEO BRONCHOSCOPY WITH ENDOBRONCHIAL ULTRASOUND;  Surgeon: Collene Gobble, MD;  Location: [place];  Service: Thoracic;  Laterality: Right;   VIDEO BRONCHOSCOPY WITH ENDOBRONCHIAL ULTRASOUND N/A 07/08/2014   Procedure: VIDEO BRONCHOSCOPY WITH ENDOBRONCHIAL ULTRASOUND;  Surgeon: Ivin Poot, MD;  Location:  [facility];  Service: Thoracic;  Laterality: N/A;    Family History  Problem Relation Age of Onset   Cancer Mother        cancer of the small intestines   Hypertension Father    Atrial fibrillation Father    Diverticulitis Father    Hypertension Sister    Hypertension Sister    Headache Sister    Hypertension Brother    Diverticulitis Brother     Social History   Occupational History   Occupation: Nurse    Employer: [employer]  Tobacco Use   Smoking status: Former    Packs/day: 0.50    Years: 40.00    Total pack years: 20.00    Types: Cigarettes    Quit date: 03/17/2013    Years since quitting: 8.7   Smokeless tobacco: Never  Vaping Use   Vaping Use: Never used  Substance and Sexual Activity    Alcohol use: Yes    Alcohol/week: 0.0 standard drinks of alcohol    Comment: " rare"   Drug use: No   Sexual activity: Not on file     Physical Exam: Blood pressure (!) 88/58, pulse 100, temperature 97.9 F (36.6 C), temperature source Oral, height 5' 5.5" (1.664 m), weight 170 lb 9.6 oz (77.4 kg), SpO2 94 %.  Gen:      No acute distress ENT:  no nasal polyps, mucus membranes moist Lungs:    No increased respiratory effort, symmetric chest wall excursion, clear to auscultation bilaterally, no wheezes or crackles CV:         Regular rate and rhythm; no murmurs, rubs, or gallops.  No pedal edema   Data Reviewed: Imaging: I have personally reviewed the chest xray today shows changes s/p partial right lobectomy. No pneumonia.   PFTs:     Latest Ref Rng & Units 11/26/2019   10:56 AM 05/09/2014    1:01 PM  PFT Results  FVC-Pre L 2.69  2.73  P  FVC-Predicted Pre % 85  82  P  FVC-Post L 2.73  2.59  P  FVC-Predicted Post % 86  78  P  Pre FEV1/FVC % % 57  62  P  Post FEV1/FCV % % 63  65  P  FEV1-Pre L 1.53  1.68  P  FEV1-Predicted Pre % 63  65  P  FEV1-Post L 1.72  1.68  P  DLCO uncorrected ml/min/mmHg 14.88  15.53  P  DLCO UNC% % 74  63  P  DLCO corrected ml/min/mmHg 14.88    DLCO COR %Predicted % 74    DLVA Predicted % 83  68  P  TLC L 5.65  4.98  P  TLC % Predicted % 111  97  P  RV % Predicted % 134  105  P    P Preliminary result   I have personally reviewed the patient's PFTs and show moderate airflow limitation with hyperinflation and air trapping.   Labs:  Lab Results  Component Value Date   WBC 7.2 02/12/2021   HGB 14.2 02/12/2021   HCT 43.6 02/12/2021   MCV 92.0 02/12/2021   PLT 255 02/12/2021   Lab Results  Component Value Date   NA 142 06/24/2021   K 4.3 06/24/2021   CL 105 06/24/2021   CO2 21 06/24/2021    Immunization status: Immunization History  Administered Date(s) Administered   Fluad Quad(high Dose 65+) 04/11/2019, 03/06/2021   Influenza  Inj Mdck Quad With Preservative 04/12/2018  Influenza Split 03/20/2018   Influenza,inj,Quad PF,6+ Mos 03/10/2015, 04/01/2015, 02/15/2017   Influenza-Unspecified 03/07/2014   PFIZER(Purple Top)SARS-COV-2 Vaccination 06/29/2019, 07/20/2019, 03/07/2020   Pneumococcal-Unspecified 09/23/2013   Tdap 09/28/2016, 04/06/2017   Zoster Recombinat (Shingrix) 11/11/2017, 02/16/2018   Zoster, Live 11/11/2017    External Records Personally Reviewed: pulmonary, oncology  Assessment:  COPD with acute exacerbation  Plan/Recommendations: Chest xray without evidence of pneumonia. She thinks trigger was spending time outside recently and poor air quality. Denies overt allergy symptoms. Will do prednisone burst. Neb in office. Instructed her to take albuterol with her on her recent trip out of time, and that she should take it more frequently at home over the next couple of days while sob at rest. Call us back if not feeling better.    Return to Care: Follow up with Dr. Lamonte Sakai as recommended.    Lenice Llamas, MD Pulmonary and [place]     es

## 2021-12-09 NOTE — Telephone Encounter (Signed)
Primary Pulmonologist: Byrum Last office visit and with whom: 05/22/2021 Byrum What do we see them for (pulmonary problems): Primary Cancer of RLL, COPD Last OV assessment/plan:   Assessment & Plan:  Primary cancer of right lower lobe of lung Now 7 years out from dx. on observation.  Planning for annual CT chest next September   COPD GOLD II B  Doing quite well and not on scheduled bronchodilator therapy.  Rarely uses albuterol.  She is interested in increasing her functional capacity and did pulmonary rehab, is now working at [employer].  She may decide to get a personal trainer going forward after she completes the [facility] program.  I did talk to her about trying her albuterol preexercise so that we can make note of any improvement.  If she does have an improvement with bronchodilator it may be time for [place] to get her started on scheduled therapy.   Keep your albuterol available to use 2 puffs when you needed for shortness of breath, chest tightness, wheezing.  You might want to try using it about 15 minutes before your exercise to see if it impacts your breathing. Agree with continuing your exercise and conditioning plan Keep your flu and COVID vaccines up-to-date Follow with Dr. Lamonte Sakai in 12 months or sooner if you have any problems.    Baltazar Apo, MD, PhD 05/22/2021, 12:36 PM [facility] [phone] or if no answer [phone]        Assessment & Plan Note by Collene Gobble, MD at 05/22/2021 12:35 PM  Author: Collene Gobble, MD Author Type: Physician Filed: 05/22/2021 12:36 PM  Note Status: Written Cosign: Cosign Not Required Encounter Date: 05/22/2021  Problem: COPD GOLD II B   Editor: Collene Gobble, MD (Physician)               Doing quite well and not on scheduled bronchodilator therapy.  Rarely uses albuterol.  She is interested in increasing her functional capacity and did pulmonary rehab, is now working at [employer].  She may decide to get a personal trainer  going forward after she completes the [facility] program.  I did talk to her about trying her albuterol preexercise so that we can make note of any improvement.  If she does have an improvement with bronchodilator it may be time for [place] to get her started on scheduled therapy.   Keep your albuterol available to use 2 puffs when you needed for shortness of breath, chest tightness, wheezing.  You might want to try using it about 15 minutes before your exercise to see if it impacts your breathing. Agree with continuing your exercise and conditioning plan Keep your flu and COVID vaccines up-to-date Follow with Dr. Lamonte Sakai in 12 months or sooner if you have any problems.         Assessment & Plan Note by Collene Gobble, MD at 05/22/2021 12:35 PM  Author: Collene Gobble, MD Author Type: Physician Filed: 05/22/2021 12:35 PM  Note Status: Written Cosign: Cosign Not Required Encounter Date: 05/22/2021  Problem: Primary cancer of right lower lobe of lung  Editor: Collene Gobble, MD (Physician)               Now 7 years out from dx. on observation.  Planning for annual CT chest next September        Patient Instructions by Collene Gobble, MD at 05/22/2021 11:45 AM  Author: Collene Gobble, MD Author Type: Physician Filed:  05/22/2021 12:14 PM  Note Status: Signed Cosign: Cosign Not Required Encounter Date: 05/22/2021  Editor: Collene Gobble, MD (Physician)               Keep your albuterol available to use 2 puffs when you needed for shortness of breath, chest tightness, wheezing.  You might want to try using it about 15 minutes before your exercise to see if it impacts your breathing. Agree with continuing your exercise and conditioning plan Keep your flu and COVID vaccines up-to-date Get your surveillance CT scan of the chest next year as planned by Dr. Julien Nordmann Follow with Dr. Lamonte Sakai in 12 months or sooner if you have any problems.          Orthostatic Vitals Recorded in This Encounter    05/22/2021  1140     Patient Position: Sitting  BP Location: Left Arm  Cuff Size: Normal   Instructions  Keep your albuterol available to use 2 puffs when you needed for shortness of breath, chest tightness, wheezing.  You might want to try using it about 15 minutes before your exercise to see if it impacts your breathing. Agree with continuing your exercise and conditioning plan Keep your flu and COVID vaccines up-to-date Get your surveillance CT scan of the chest next year as planned by Dr. Julien Nordmann Follow with Dr. Lamonte Sakai in 12 months or sooner if you have any problems.       Was appointment offered to patient (explain)?  None available.   Reason for call: symptoms started late Sat pm 7/1, 2 covid tests are negative.  Mucous is thick when able to get it up.  Nasal drainage is clear.  She has had lung cancer and had had a portion of one of her lungs removed.  Tried inhalers in the past that did not make a difference.  Trying to stay off inhalers as long as possible, only has rescue inhaler.  Has not used rescue inhaler.  SOB with rest, talking and exertion.  Usually has not had sob to this extent or had to use rescue inhaler.  Chest discomfort with cough.  Has not used anything OTC for cough.  Saw pcp recently (11/30/21) and sats were in the high 90's and had no symptoms at that time.  She does not have a pulse oximeter to check her sats at home.  Sarah, please advise.  Thank you.  (examples of things to ask: : When did symptoms start? Fever? Cough? Productive? Color to sputum? More sputum than usual? Wheezing? Have you needed increased oxygen? Are you taking your respiratory medications? What over the counter measures have you tried?)  Allergies  Allergen Reactions   Advil [Ibuprofen] Other (See Comments)    Causes hematuria    Codeine Hives   Hydrocodone-Guaifenesin Itching   Sulfa Antibiotics Hives and Swelling   Temazepam Other (See Comments)    dizziness   Plasticized Base  [Plastibase] Rash    tape   Zofran [Ondansetron Hcl] Palpitations    Immunization History  Administered Date(s) Administered   Fluad Quad(high Dose 65+) 04/11/2019, 03/06/2021   Influenza Inj Mdck Quad With Preservative 04/12/2018   Influenza Split 03/20/2018   Influenza,inj,Quad PF,6+ Mos 03/10/2015, 04/01/2015, 02/15/2017   Influenza-Unspecified 03/07/2014   PFIZER(Purple Top)SARS-COV-2 Vaccination 06/29/2019, 07/20/2019, 03/07/2020   Pneumococcal-Unspecified 09/23/2013   Tdap 09/28/2016, 04/06/2017   Zoster Recombinat (Shingrix) 11/11/2017, 02/16/2018   Zoster, Live 11/11/2017

## 2021-12-09 NOTE — Patient Instructions (Signed)
Follow up with Dr Lamonte Sakai as scheduled.   Take prednisone 40 mg x 5 days.  Take the albuterol rescue inhaler every 4 to 6 hours as needed for wheezing or shortness of breath. Take this more frequently over the next couple of days while you are recovering.  You can also take it 15 minutes before exercise or exertional activity. Side effects include heart racing or pounding, jitters or anxiety. If you have a history of an irregular heart rhythm, it can make this worse. Can also give some patients a hard time sleeping.  When you are outdoors - keep albuterol with you. Avoid the hottest part of the days outside. Take antihistamine as needed for any allergy symptoms. A N95 mask will be helpful if you are outdoors with poor air quality.

## 2021-12-09 NOTE — Telephone Encounter (Signed)
Called and spoke with patient, advised of recommendations per Eric Form NP.  She verbalized understanding.  She will arrive by 2 pm for check in and chest x-ray and scheduled to see Dr. Shearon Stalls at 2:30 pm.   Nothing further needed.

## 2021-12-10 ENCOUNTER — Telehealth: Payer: Self-pay | Admitting: Emergency Medicine

## 2021-12-10 MED ORDER — AMOXICILLIN-POT CLAVULANATE 875-125 MG PO TABS: 1.0000 | ORAL_TABLET | Freq: Two times a day (BID) | ORAL | 0 refills | Status: DC

## 2021-12-10 MED ORDER — BENZONATATE 100 MG PO CAPS: 100.0000 mg | ORAL_CAPSULE | Freq: Two times a day (BID) | ORAL | 0 refills | Status: DC | PRN

## 2021-12-10 NOTE — Telephone Encounter (Signed)
Called and spoke with patient. She stated that she was seen by Dr. Shearon Stalls yesterday and was prescribed prednisone 40mg . She took the first dose this morning and has not noticed an improvement. She had a productive cough yesterday with mostly clear phlegm but today it has changed to dark tan with blood streaks. Denied any fever or body aches. She has used her albuterol 3 times today.   She wants to know if Dr. Shearon Stalls would be willing to call in an abx and Tessalon perles for her.   [facility]arm[facility] [address].   Dr. Shearon Stalls, can you please advise? Thanks!

## 2021-12-10 NOTE — Telephone Encounter (Signed)
Spero Geralds, MD to [facility] Triage Pool       12/10/21  4:31 PM  I have sent benzonatate to her pharmacy. I would give the prednisone more time - would not expect it to work in just a few short hours, and please get her a nebulizer machine with albuterol nebs 4 times daily as needed if the breathing treatment yesterday was helpful   I spoke with the pt and notified of response per Dr Shearon Stalls. She disagreed and stated that she does need abx and wants to now call her PCP for this. She says will discuss further with Dr Lamonte Sakai at her next appt.

## 2021-12-10 NOTE — Telephone Encounter (Signed)
Pt seen for acute visit with [facility] 7/5. Took prednisone this morning at 7am and has already used emergency inhaler 3x. Coughing episodes are worse and phelgm is dark tan with bloodstreaks. SOB is worse. Wonders if she can get an antibiotic and tessalon pearl. Please advise.

## 2021-12-17 ENCOUNTER — Encounter (HOSPITAL_BASED_OUTPATIENT_CLINIC_OR_DEPARTMENT_OTHER): Payer: Self-pay

## 2021-12-17 ENCOUNTER — Emergency Department (HOSPITAL_BASED_OUTPATIENT_CLINIC_OR_DEPARTMENT_OTHER): Payer: Medicare Other | Admitting: Radiology

## 2021-12-17 ENCOUNTER — Emergency Department (HOSPITAL_BASED_OUTPATIENT_CLINIC_OR_DEPARTMENT_OTHER)
Admission: EM | Admit: 2021-12-17 | Discharge: 2021-12-17 | Disposition: A | Discharge status: Home / Self Care | Payer: Medicare Other | Attending: Emergency Medicine | Admitting: Emergency Medicine

## 2021-12-17 ENCOUNTER — Other Ambulatory Visit: Payer: Self-pay

## 2021-12-17 ENCOUNTER — Other Ambulatory Visit (HOSPITAL_BASED_OUTPATIENT_CLINIC_OR_DEPARTMENT_OTHER): Payer: Self-pay

## 2021-12-17 DIAGNOSIS — Z79899 Other long term (current) drug therapy: Secondary | ICD-10-CM | POA: Diagnosis not present

## 2021-12-17 DIAGNOSIS — R112 Nausea with vomiting, unspecified: Secondary | ICD-10-CM | POA: Insufficient documentation

## 2021-12-17 DIAGNOSIS — I2510 Atherosclerotic heart disease of native coronary artery without angina pectoris: Secondary | ICD-10-CM | POA: Diagnosis not present

## 2021-12-17 DIAGNOSIS — R197 Diarrhea, unspecified: Secondary | ICD-10-CM | POA: Diagnosis not present

## 2021-12-17 DIAGNOSIS — Z85118 Personal history of other malignant neoplasm of bronchus and lung: Secondary | ICD-10-CM | POA: Insufficient documentation

## 2021-12-17 DIAGNOSIS — I10 Essential (primary) hypertension: Secondary | ICD-10-CM | POA: Insufficient documentation

## 2021-12-17 DIAGNOSIS — J449 Chronic obstructive pulmonary disease, unspecified: Secondary | ICD-10-CM | POA: Insufficient documentation

## 2021-12-17 DIAGNOSIS — Z7982 Long term (current) use of aspirin: Secondary | ICD-10-CM | POA: Diagnosis not present

## 2021-12-17 LAB — CBC WITH DIFFERENTIAL/PLATELET
Abs Immature Granulocytes: 0.05 10*3/uL (ref 0.00–0.07)
Basophils Absolute: 0 10*3/uL (ref 0.0–0.1)
Basophils Relative: 0 %
Eosinophils Absolute: 0.1 10*3/uL (ref 0.0–0.5)
Eosinophils Relative: 1 %
HCT: 48 % — ABNORMAL HIGH (ref 36.0–46.0)
Hemoglobin: 15.9 g/dL — ABNORMAL HIGH (ref 12.0–15.0)
Immature Granulocytes: 0 %
Lymphocytes Relative: 10 %
Lymphs Abs: 1.6 10*3/uL (ref 0.7–4.0)
MCH: 29.3 pg (ref 26.0–34.0)
MCHC: 33.1 g/dL (ref 30.0–36.0)
MCV: 88.6 fL (ref 80.0–100.0)
Monocytes Absolute: 1.1 10*3/uL — ABNORMAL HIGH (ref 0.1–1.0)
Monocytes Relative: 7 %
Neutro Abs: 12.7 10*3/uL — ABNORMAL HIGH (ref 1.7–7.7)
Neutrophils Relative %: 82 %
Platelets: 321 10*3/uL (ref 150–400)
RBC: 5.42 MIL/uL — ABNORMAL HIGH (ref 3.87–5.11)
RDW: 12.4 % (ref 11.5–15.5)
WBC: 15.6 10*3/uL — ABNORMAL HIGH (ref 4.0–10.5)
nRBC: 0 % (ref 0.0–0.2)

## 2021-12-17 LAB — COMPREHENSIVE METABOLIC PANEL
ALT: 7 U/L (ref 0–44)
AST: 9 U/L — ABNORMAL LOW (ref 15–41)
Albumin: 4 g/dL (ref 3.5–5.0)
Alkaline Phosphatase: 81 U/L (ref 38–126)
Anion gap: 15 (ref 5–15)
BUN: 16 mg/dL (ref 8–23)
CO2: 21 mmol/L — ABNORMAL LOW (ref 22–32)
Calcium: 9.7 mg/dL (ref 8.9–10.3)
Chloride: 102 mmol/L (ref 98–111)
Creatinine, Ser: 0.68 mg/dL (ref 0.44–1.00)
GFR, Estimated: >60 mL/min (ref >60)
Glucose, Bld: 143 mg/dL — ABNORMAL HIGH (ref 70–99)
Potassium: 3.8 mmol/L (ref 3.5–5.1)
Sodium: 138 mmol/L (ref 135–145)
Total Bilirubin: 1 mg/dL (ref 0.3–1.2)
Total Protein: 6.7 g/dL (ref 6.5–8.1)

## 2021-12-17 MED ORDER — LACTATED RINGERS IV BOLUS
1000.0000 mL | Freq: Once | INTRAVENOUS | Status: AC
  Administered 2021-12-17: 1000 mL via INTRAVENOUS

## 2021-12-17 MED ORDER — PROCHLORPERAZINE MALEATE 10 MG PO TABS
10.0000 mg | ORAL_TABLET | Freq: Two times a day (BID) | ORAL | 0 refills | Status: DC | PRN
  Filled 2021-12-17: qty 10, 5d supply, fill #0

## 2021-12-17 MED ORDER — PROCHLORPERAZINE EDISYLATE 10 MG/2ML IJ SOLN
5.0000 mg | Freq: Once | INTRAMUSCULAR | Status: AC
  Administered 2021-12-17: 5 mg via INTRAVENOUS
  Filled 2021-12-17: qty 2

## 2021-12-17 NOTE — ED Provider Notes (Signed)
[name] Provider Note   CSN: [id number] Arrival date & time: 12/17/21  1100     History  Chief Complaint  Patient presents with   Diarrhea    [name] [name] is a 66 y.o. female.   Diarrhea Patient presents with diarrhea.  On July 5 took prednisone and then started Augmentin.  A few days later began to have nausea vomiting diarrhea.  Has had set abdominal cramping and decreased oral intake since.  No fevers.  Respiratory status is cleared up.  History of COPD.  No blood in the diarrhea.  Has had nausea and vomiting.      Past Medical History:  Diagnosis Date   Anxiety    husband also cancer pt   Cancer ([place])    lung   Complication of anesthesia    bronchospasms post-op (pulmonary did consult but pt was discharged later that day)  With last surg. she was given albuterol inhaler & had steroid with surg.     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 Authorizing Provider  amoxicillin-clavulanate (AUGMENTIN) 875-125 MG tablet Take 1 tablet by mouth 2 (two) times daily. 12/10/21   Spero Geralds, MD  aspirin EC 81 MG tablet Take 1 tablet (81 mg total) by  mouth daily. 08/16/17   Belva Crome, MD  benzonatate (TESSALON) 100 MG capsule Take 1 capsule (100 mg total) by mouth 2 (two) times daily as needed for cough. 12/10/21   Spero Geralds, MD  losartan (COZAAR) 50 MG tablet Take 1 tablet (50 mg total) by mouth daily. 06/10/21   Belva Crome, MD  nitroGLYCERIN (NITROSTAT) 0.4 MG SL tablet Place 1 tablet (0.4 mg total) under the tongue every 5 (five) minutes as needed for chest pain. 06/10/21 09/08/21  Belva Crome, MD  predniSONE (DELTASONE) 20 MG tablet Take 2 tablets (40 mg total) by mouth daily with breakfast. 12/09/21   Spero Geralds, MD  rosuvastatin (CRESTOR) 10 MG tablet TAKE ONE TABLET BY MOUTH DAILY 11/10/21   Belva Crome, MD  Suvorexant (BELSOMRA) 10 MG TABS Take 10 mg by mouth at bedtime as needed. 10/08/21   Dohmeier, Asencion Partridge, MD  VENTOLIN HFA 108 (90 Base) MCG/ACT inhaler INHALE 1 PUFF BY MOUTH THREE TIMES DAILY AS NEEDED FOR WHEEZING OR SHORTNESS OF BREATH 02/24/21   Collene Gobble, MD  Vitamin D, Ergocalciferol, (DRISDOL) 50000 units CAPS capsule Take 50,000 Units by mouth every 7 (seven) days.    [provider]      Allergies  Advil [ibuprofen], Codeine, Hydrocodone-guaifenesin, Sulfa antibiotics, Temazepam, Plasticized base [plastibase], and Zofran [ondansetron hcl]    Review of Systems   Review of Systems  Gastrointestinal:  Positive for diarrhea.    Physical Exam Updated Vital Signs BP 128/71   Pulse (!) 105   Temp 98.4 F (36.9 C) (Oral)   Resp 16   Ht 5' 5.5" (1.664 m)   Wt 77.1 kg   SpO2 97%   BMI 27.86 kg/m  Physical Exam Vitals and nursing note reviewed.  HENT:     Mouth/Throat:     Mouth: Mucous membranes are moist.  Cardiovascular:     Rate and Rhythm: Normal rate and regular rhythm.  Pulmonary:     Effort: Pulmonary effort is normal.  Abdominal:     Tenderness: There is no abdominal tenderness.  Musculoskeletal:        General: No tenderness.  Skin:    General: Skin is warm.     Capillary  Refill: Capillary refill takes less than 2 seconds.  Neurological:     Mental Status: She is alert.     ED Results / Procedures / Treatments   Labs (all labs ordered are listed, but only abnormal results are displayed) Labs Reviewed  CBC WITH DIFFERENTIAL/PLATELET - Abnormal; Notable for the following components:      Result Value   WBC 15.6 (*)    RBC 5.42 (*)    Hemoglobin 15.9 (*)    HCT 48.0 (*)    Neutro Abs 12.7 (*)    Monocytes Absolute 1.1 (*)    All other components within normal limits  COMPREHENSIVE METABOLIC PANEL - Abnormal; Notable for the following components:   CO2 21 (*)    Glucose, Bld 143 (*)    AST 9 (*)    All other components within normal limits  C DIFFICILE QUICK SCREEN W PCR REFLEX      EKG None  Radiology DG Chest 2 View  Result Date: 12/17/2021 CLINICAL DATA:  Nausea, vomiting EXAM: CHEST - 2 VIEW COMPARISON:  12/09/2021 FINDINGS: Cardiac size is within normal limits. There are no signs of pulmonary edema or focal pulmonary consolidation. Small linear densities in the parahilar regions may suggest minimal scarring. Blunting of right lateral CP angle may suggest pleural thickening. No significant interval changes are noted. IMPRESSION: There are no signs of pulmonary edema or new focal infiltrates. Electronically Signed   By: Elmer Picker M.D.   On: 12/17/2021 11:43    Procedures Procedures    Medications Ordered in ED Medications  lactated ringers bolus 1,000 mL (1,000 mLs Intravenous New Bag/Given 12/17/21 1424)  prochlorperazine (COMPAZINE) injection 5 mg (5 mg Intravenous Given 12/17/21 1425)    ED Course/ Medical Decision Making/ A&P                           Medical Decision Making Amount and/or Complexity of Data Reviewed Labs: ordered. Radiology: ordered.  Risk Prescription drug management.   Patient with nausea vomiting diarrhea.  Has had for the last few days.  Decreased oral intake.  Began after antibiotics.  This  would put her at high risk for pathology such as C. difficile.  Also potentially could be just secondary to antibiotic use.  Benign abdominal exam.  White count done and mildly elevated.  Potentially could be infectious diarrhea also.  Fluid will be given IV as are antiemetics.  Will give oral trial.  If tolerates orals hopefully should  be able to be discharged home.  If continued pain or cannot tolerate oral may evaluate with CT of the abdomen/pelvis.  Patient is states her diarrhea is decreased since taking Imodium.  Care turned over to Dr. Ashok Cordia        Final Clinical Impression(s) / ED Diagnoses Final diagnoses:  None    Rx / DC Orders ED Discharge Orders     None         Davonna Belling, MD 12/17/21 1513

## 2021-12-17 NOTE — ED Triage Notes (Signed)
"  My pulmonologist treated me for a URI on July 5th, took prednisone for 5 days, and then done Augmentin, on day 4 of the Augmentin was July 11th I began having n/v/d" per pt

## 2022-01-05 DIAGNOSIS — I10 Essential (primary) hypertension: Secondary | ICD-10-CM | POA: Diagnosis not present

## 2022-01-05 DIAGNOSIS — G43809 Other migraine, not intractable, without status migrainosus: Secondary | ICD-10-CM | POA: Diagnosis not present

## 2022-01-05 DIAGNOSIS — G4733 Obstructive sleep apnea (adult) (pediatric): Secondary | ICD-10-CM | POA: Diagnosis not present

## 2022-01-05 DIAGNOSIS — I5020 Unspecified systolic (congestive) heart failure: Secondary | ICD-10-CM | POA: Diagnosis not present

## 2022-01-05 DIAGNOSIS — J449 Chronic obstructive pulmonary disease, unspecified: Secondary | ICD-10-CM | POA: Diagnosis not present

## 2022-01-18 DIAGNOSIS — Z1231 Encounter for screening mammogram for malignant neoplasm of breast: Secondary | ICD-10-CM | POA: Diagnosis not present

## 2022-01-18 DIAGNOSIS — L821 Other seborrheic keratosis: Secondary | ICD-10-CM | POA: Diagnosis not present

## 2022-01-18 DIAGNOSIS — D2271 Melanocytic nevi of right lower limb, including hip: Secondary | ICD-10-CM | POA: Diagnosis not present

## 2022-01-18 DIAGNOSIS — D1801 Hemangioma of skin and subcutaneous tissue: Secondary | ICD-10-CM | POA: Diagnosis not present

## 2022-01-18 DIAGNOSIS — L812 Freckles: Secondary | ICD-10-CM | POA: Diagnosis not present

## 2022-01-18 DIAGNOSIS — D2272 Melanocytic nevi of left lower limb, including hip: Secondary | ICD-10-CM | POA: Diagnosis not present

## 2022-01-18 DIAGNOSIS — D225 Melanocytic nevi of trunk: Secondary | ICD-10-CM | POA: Diagnosis not present

## 2022-01-20 DIAGNOSIS — N3941 Urge incontinence: Secondary | ICD-10-CM | POA: Diagnosis not present

## 2022-01-20 DIAGNOSIS — R350 Frequency of micturition: Secondary | ICD-10-CM | POA: Diagnosis not present

## 2022-01-20 DIAGNOSIS — R351 Nocturia: Secondary | ICD-10-CM | POA: Diagnosis not present

## 2022-01-21 ENCOUNTER — Encounter: Payer: Self-pay | Admitting: Neurology

## 2022-01-21 ENCOUNTER — Ambulatory Visit (INDEPENDENT_AMBULATORY_CARE_PROVIDER_SITE_OTHER): Payer: Medicare Other | Admitting: Neurology

## 2022-01-21 VITALS — BP 145/79 | HR 81 | Ht 65.5 in | Wt 177.0 lb

## 2022-01-21 DIAGNOSIS — H53413 Scotoma involving central area, bilateral: Secondary | ICD-10-CM | POA: Diagnosis not present

## 2022-01-21 HISTORY — DX: Scotoma involving central area, bilateral: H53.413

## 2022-01-21 MED ORDER — ASPIRIN 81 MG PO TBEC: 81.0000 mg | DELAYED_RELEASE_TABLET | Freq: Every day | ORAL | 12 refills | Status: AC

## 2022-01-21 NOTE — Progress Notes (Signed)
SLEEP MEDICINE CLINIC    Provider:  Larey Seat, MD  Primary Care Physician:  [name], [place] [address]     Referring Provider: [name], [place],  [place] [address]          Chief Complaint according to patient   Patient presents with:     New Problem (Initial Visit)     Pt now referred for evaluation of migraines. Pt was on verapamil for 11 years for migraines, but about 2 years ago pt was taken of due to heart problems. She then switched to losartin. Pt sx started back about a month ago, pt started having migraines again w partial vision loss. Not taking anything.       HISTORY OF PRESENT ILLNESS:    [name] [name] is a 66 y.o.Caucasian female patient seen here as a referral on 01/21/2022 from Dr Jannifer Franklin, for a sleep evaluation. My second visit was on 10-08-2021 she was here to address sleep and memory issues. Now she is her for atypical migraines, on 01-21-2022. [name] [name] reports that for cardiac reasons her cardiologist Dr. Daneen Schick had to switch her from verapamil which had protected her for years from these ophthalmic migraines to losartan 18 months ago- about 3 or 4 weeks ago she had 2 episodes of central scotoma.  She described looking at sign and can only see part of a street name or headline.  She is not sure if this is a bilateral vision restriction, but I suspect it is. The missing part of her visual field is neither black not blurred- no floaters.  No associated balance problems, weakness, numbness. No speech problems. Self resolving symptoms within 60 minutes   PS : She is sleeping better overall on Belsomra.  She is awaiting neuropsychological memory testing with Dr Melvyn Novas.      10-08-2021:  Dr .Jannifer Franklin did MMSE and MRI and had her followed by NP. She reports slower word finding, naming, and some spatial orientation difficulties with finding the car in a car park. Today is 08 Oct 2021 and I am  meeting today with Mrs. Mealey to discuss the results of her sleep study which had been performed in February of this year on 2-6 2023 upon referral from Dr. Jannifer Franklin.  The patient had reported nonrestorative nonrefreshing sleep.   Difficulties to fall asleep and to stay asleep.  She had an AHI of 8.5/h which is very mild sleep apnea only during REM sleep was AHI 21.1/h.  In supine sleep there were more apnea spent in nonsupine.  She had only 4 minutes of hypoxia which is insignificant.  She did have frequent periodic limb movements but this did not lead to arousals.  There were also no REM no limb movements during REM sleep which is a good sign.  Normal sinus rhythm was seen on her cardiac channel.  She had 3 bathroom breaks which is a higher number than usually seen.  And she had a lot of spontaneous arousals with either can indicate chronic insomnia but can also be related to underlying pain or anxiety or depression.  The treatment of such mild apnea would be considered optional.  If she would want treatment I would recommend CPAP but we meet today to discuss that further. She opted out of CPAP.   Her memory will also be tested today:  MOCA:     10/08/2021    2:45 PM  Montreal Cognitive  Assessment   Visuospatial/ Executive (0/5) 2  Naming (0/3) 2  Attention: Read list of digits (0/2) 2  Attention: Read list of letters (0/1) 1  Attention: Serial 7 subtraction starting at 100 (0/3) 3  Language: Repeat phrase (0/2) 2  Language : Fluency (0/1) 1  Abstraction (0/2) 2  Delayed Recall (0/5) 3  Orientation (0/6) 5  Total 23      06-24-2021:  Referral from Dr. Jannifer Franklin, who followed her for memory disorder associated with fatigue and frequent awakening. Wants evaluation  to see if her forgetfulness is coming from possible sleep apnea.  Pt is not aware of any snoring or apneic episodes.  Dr .Jannifer Franklin did MMSE and MRI and had her followed by NP.  She reports slower word finding, naming, and some spatial  orientation difficulties with finding the car in a car park.  Sleep relevant medical history: Her sleep is not refreshing, fragmented, tossing and turning. Initiating sleep is difficult and insomnia also following sleep interruption after nocturia 2-4 times at night.  She has had a tonsillectomy and adenoid ectomy- age 59, DDD cervical spine, enough degeneration affecting  her barium swallow test- no osteoporosis. Thyroid biopsy.  Former smoker, quit 2014. After her husband passed she used low dose ativan and did not like the hang- over effect.  [name]   has a past medical history of Anxiety, Cancer ([facility]), Complication of anesthesia, Congenital renal atrophy, COPD (chronic obstructive pulmonary disease) ([facility]), Coronary artery disease, Degenerative arthritis, GERD (gastroesophageal reflux disease), History of migraine, Hypertension, Memory disorder (03/29/2017), Migraine equivalent (11/09/2012), PONV (postoperative nausea and vomiting), PVC (premature ventricular contraction), Shortness of breath dyspnea, Thyroid nodule, Varicose veins, Visual disturbance, Vitamin D deficiency, and Wears glasses.   Family medical /sleep history: non diagnosed family member on CPAP with OSA, many people have insomnia, no sleep walkers.  2 sisters, a brother, mother with insomnia.    Social history:  Patient is retired from Therapist, sports- former Geophysical data processor- and lives in a household alone. Family status is widowed 4/ 2018 , with adult children. She has cats and dogs. A daughter has moved back in.   The patient used to work in shifts( night/ rotating,) Tobacco use quit 2014.   ETOH use - rare ,  Caffeine intake in form of Coffee( 1 cup in AM) Soda( /) Tea ( /) or energy drinks. Regular exercise : none .   Hobbies : too tired    Sleep habits are as follows: The patient's dinner time is between 6-8 PM. The patient goes to bed at 11-12 PM and continues to sleep for intervals of 1-3  hours, wakes for many bathroom  breaks, the first time at 1 AM.   The preferred sleep position is laterally, with the support of 1-2 pillows.  Dreams are reportedly rare.  8-9  AM is the usual rise time. The patient wakes up spontaneously or by puppy alarm.  She reports not feeling refreshed or restored in AM, with symptoms such as dry mouth, no morning headaches, and residual fatigue.  Verapamil controlled her migraines.  Naps are taken infrequently, lasting from 60 to 120 minutes .   Review of Systems: Out of a complete 14 system review, the patient complains of only the following symptoms, and all other reviewed systems are negative.:  Fatigue, sleepiness , snoring, fragmented sleep, Insomnia NOCTURIA, no RLS.     How likely are you to doze in the following situations: 0 = not likely, 1 = slight  chance, 2 = moderate chance, 3 = high chance   Sitting and Reading? Watching Television? Sitting inactive in a public place (theater or meeting)? As a passenger in a car for an hour without a break? Lying down in the afternoon when circumstances permit? Sitting and talking to someone? Sitting quietly after lunch without alcohol? In a car, while stopped for a few minutes in traffic?   Total = /7 24 points   FSS endorsed at 41/ 63 points.   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  Tobacco Use   Smoking status: Former    Packs/day: 0.50    Years: 40.00    Total pack years: 20.00    Types: Cigarettes    Quit date: 03/17/2013    Years since quitting: 8.8   Smokeless tobacco: Never  Vaping Use   Vaping Use: Never used  Substance and Sexual Activity   Alcohol use: Yes    Alcohol/week: 0.0 standard drinks of alcohol    Comment: " rare"   Drug use: No   Sexual activity: Not on  file  Other Topics Concern   Not on file  Social History Narrative   Lives w daughter   Caffeine use: 1-2 cups coffee per day   Right handed    Social Determinants of Health   Financial Resource Strain: Not on file  Food Insecurity: Not on file  Transportation Needs: Not on file  Physical Activity: Not on file  Stress: Not on file  Social Connections: Not on file    Family History  Problem Relation Age of Onset   Cancer Mother        cancer of the small intestines   Hypertension Father    Atrial fibrillation Father    Diverticulitis Father    Hypertension Sister    Hypertension Sister    Headache Sister    Hypertension Brother    Diverticulitis Brother     Past Medical History:  Diagnosis Date   Anxiety    husband also cancer pt   Cancer ([facility])    lung   Complication of anesthesia    bronchospasms post-op (pulmonary did consult but pt was discharged later that day)  With last surg. she was given albuterol inhaler & had steroid with surg.     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, transient, takes Verapimil for migraine & BP control    PONV (postoperative nausea and vomiting)    PVC (premature ventricular contraction)    Shortness of breath dyspnea    with exertion   Thyroid nodule    benign by biopsy   Varicose veins    left leg   Visual disturbance    Episodic   Vitamin D deficiency    Wears glasses     Past Surgical History:  Procedure Laterality Date   CARPAL TUNNEL RELEASE Left    CESAREAN SECTION     COLONOSCOPY W/ BIOPSIES AND POLYPECTOMY     DILATION AND CURETTAGE OF UTERUS      TONSILLECTOMY     VARICOSE VEIN SURGERY     left   VIDEO ASSISTED THORACOSCOPY (VATS)/ LOBECTOMY Right 07/23/2014   Procedure: VIDEO ASSISTED THORACOSCOPY (VATS)/ LOBECTOMY;  Surgeon: Ivin Poot, MD;  Location: [facility];  Service: Thoracic;  Laterality: Right;   VIDEO BRONCHOSCOPY N/A 07/23/2014   Procedure: VIDEO BRONCHOSCOPY;  Surgeon: Ivin Poot, MD;  Location: [facility];  Service: Thoracic;  Laterality: N/A;   VIDEO BRONCHOSCOPY WITH ENDOBRONCHIAL ULTRASOUND Right 04/17/2014   Procedure: VIDEO BRONCHOSCOPY WITH ENDOBRONCHIAL ULTRASOUND;  Surgeon: Collene Gobble, MD;  Location: [facility];  Service: Thoracic;  Laterality: Right;   VIDEO BRONCHOSCOPY WITH ENDOBRONCHIAL ULTRASOUND N/A 07/08/2014   Procedure: VIDEO BRONCHOSCOPY WITH ENDOBRONCHIAL ULTRASOUND;  Surgeon: Ivin Poot, MD;  Location: [facility];  Service: Thoracic;  Laterality: N/A;     Current Outpatient Medications on File Prior to Visit  Medication Sig Dispense Refill   aspirin EC 81 MG tablet Take 1 tablet (81 mg total) by mouth daily. 90 tablet 3   losartan (COZAAR) 50 MG tablet Take 1 tablet (50 mg total) by mouth daily. 90 tablet 3   rosuvastatin (CRESTOR) 10 MG tablet TAKE ONE TABLET BY MOUTH DAILY 90 tablet 2   Suvorexant (BELSOMRA) 10 MG TABS Take 10 mg by mouth at bedtime as needed. 30 tablet 2   VENTOLIN HFA 108 (90 Base) MCG/ACT inhaler INHALE 1 PUFF BY MOUTH THREE TIMES DAILY AS NEEDED FOR WHEEZING OR SHORTNESS OF BREATH 18 g 9   Vitamin D, Ergocalciferol, (DRISDOL) 50000 units CAPS capsule Take 50,000 Units by mouth every 7 (seven) days.     nitroGLYCERIN (NITROSTAT) 0.4 MG SL tablet Place 1 tablet (0.4 mg total) under the tongue every 5 (five) minutes as needed for chest pain. 25 tablet 6   Current Facility-Administered Medications on File Prior to Visit  Medication Dose Route Frequency Provider Last Rate Last Admin   Ampicillin-Sulbactam (UNASYN) 3 g in sodium chloride 0.9 % 100 mL IVPB  3 g Intravenous Once Avelina Laine, PA-C        Allergies  Allergen Reactions   Advil [Ibuprofen] Other (See Comments)    Causes hematuria    Augmentin [Amoxicillin-Pot Clavulanate] Nausea And Vomiting   Codeine Hives   Hydrocodone-Guaifenesin Itching   Sulfa Antibiotics Hives and Swelling   Temazepam Other (See Comments)    dizziness   Plasticized Base [Plastibase] Rash    tape   Zofran [Ondansetron Hcl] Palpitations    Physical exam:  Today's Vitals   01/21/22 1043  BP: (!) 145/79  Pulse: 81  Weight: 177 lb (80.3 kg)  Height: 5' 5.5" (1.664 m)   Body mass index is 29.01 kg/m.   Wt Readings from Last 3 Encounters:  01/21/22 177 lb (80.3 kg)  12/17/21 170 lb (77.1 kg)  12/09/21 170 lb 9.6 oz (77.4 kg)     Ht Readings from Last 3 Encounters:  01/21/22 5' 5.5" (1.664 m)  12/17/21 5' 5.5" (1.664 m)  12/09/21 5' 5.5" (1.664 m)      General: The patient is awake, alert and appears not in acute distress. The patient is well groomed. Head: Normocephalic, atraumatic.  Neck is supple. Mallampati 3  neck circumference:14 inches . Nasal airflow  patent. Prognathia is seen.  Dental  status: biological teeth  Cardiovascular:  Regular rate and cardiac rhythm by pulse,  without distended neck veins. Respiratory: Lungs are clear to auscultation.  Skin:  Without evidence of ankle edema, with EF of 45 % (10 . Trunk: The patient's posture is erect.   Neurologic exam : The patient is awake and alert, oriented to place and time.   Memory subjective described as intact.  Attention span & concentration ability appears normal.  Speech is slowed, non-fluent,  without  dysarthria, dysphonia .  Mood and affect are appropriate.   Cranial nerves: no loss of smell or taste reported  Pupils are equal and briskly reactive to light. Funduscopic exam deferred..  Extraocular movements in vertical and horizontal planes were intact and without nystagmus. No Diplopia. Visual fields by finger perimetry are  intact. Hearing was intact to soft voice and finger rubbing.   Facial motor strength is symmetric and tongue and uvula move midline.  Neck ROM : rotation, tilt and flexion extension were normal for age and shoulder shrug was symmetrical.    Motor exam:  Symmetric bulk, tone and ROM.   Normal tone without cog -wheeling, symmetric grip strength .   Sensory:  Fine touch and vibration were intact.   Proprioception tested in the upper extremities was normal.   Coordination: Rapid alternating movements in the fingers/hands were of normal speed.  The Finger-to-nose maneuver was intact.  Arthritic changes to fingers.   Gait and station: Patient could rise unassisted from a seated position, walked without assistive device.  Stance is of normal width/ base.  Toe and heel walk were deferred.  Deep tendon reflexes: in the upper and lower extremities are symmetric and intact.  Babinski response was deferred.        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 She was on daily ASA, but d/c  baby aspirin 3 months ago for Colonoscopy,stool sample.  She was a smoker, she has a restricted EF 40-45% , all this makes TIA prevention and vascular health imperative.  Plan : She should restart an enteric coated aspirin- 81 mg.  All the following problems were not part of today's examination:   1) COGNITIVE DECLINE, subjectively- progressive word finding difficulties: On MOCA today no trouble generating words. More of a visio-spatial and executive function problem.  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   2)Long standing Insomnia. Failed trazodone,had panic attacks. Try Belsomra.   3) Stage 3 Lung cancer - 2016-2018 . Monitored. Dr Earlie Server, Dr Lamonte Sakai.    After spending a total time of 35 minutes face to face and additional time for  physical and neurologic examination, review of laboratory studies,  personal review of imaging studies, reports and results of other testing and review of referral information / records as far as provided in visit, we discussed MRI brain and lab results.    I have established the following Plan      1) Mental status- cognitive slowing, delayed wordfinding- MCI ? college graduate - needs MOCA test yearly.   2) refer to neuropsychologist , memory testing in detail.   3) Belsomra , patient should not use anti-cholinergica.   I would like to thank [name], MD and [name], [address],  [place] [address] for allowing me to meet with and to take care of this pleasant patient.  I plan to follow up either personally or through our NP within 4  months , following neuropsychology work up in February    CC: I will share my notes with PCP.  Electronically signed by: Larey Seat, MD 01/21/2022 11:15 AM  [facility] Board certified by The [employer] of Sleep Medicine and Diplomate of the [employer] of Sleep Medicine. Board certified In Neurology through the [place], Fellow of the [employer] of Neurology. Medical Director of [facility].

## 2022-01-21 NOTE — Patient Instructions (Signed)
Scotoma  Scotoma is an area of your vision where you cannot see as well or cannot see at all. This is sometimes called a blind spot. It may look like a dark or gray area where you cannot see anything, and it may be surrounded by a ring or an area of blurry vision. There are two types of scotoma: Central scotoma. This is when you have a blind spot in the middle of your vision. Peripheral scotoma. This is when you have a blind spot at the edge of your vision. There are many possible causes of scotoma. You may need to see a doctor who specializes in eye conditions (ophthalmologist) to find the cause. Follow these instructions at home: Take over-the-counter and prescription medicines only as told by your doctor. Do not drive or use machines unless your doctor says it is okay. Work with a Psychologist, educational as told by your doctor. When you are reading or doing other activities that use small objects or involve small text, use good lighting and magnifying devices. Do not smoke or use any products that contain nicotine or tobacco. If you need help quitting, ask your doctor. Watch for any changes in your symptoms. Tell your doctor about them or any new symptoms. Keep all follow-up visits. Contact a doctor if: You have changes in your condition. Your symptoms get worse. You have eye pain. Get help right away if:  You have sudden loss of vision. You have any signs of a stroke. "BE FAST" is an easy way to remember the main warning signs: B - Balance. Dizziness, sudden trouble walking, or loss of balance. E - Eyes. Trouble seeing or a change in how you see. F - Face. Sudden weakness or loss of feeling of the face. The face or eyelid may droop on one side. A - Arms. Weakness or loss of feeling in an arm. This happens all of a sudden and most often on one side of the body. S - Speech. Sudden trouble speaking, slurred speech, or trouble understanding what people say. T - Time. Time to call emergency  services. Write down what time symptoms started. You have other signs of a stroke, such as: A sudden, very bad headache with no known cause. Feeling like you may vomit (nausea). Vomiting. A seizure. These symptoms may be an emergency. Get help right away. Call your local emergency services (911 in the [place] Do not wait to see if the symptoms will go away. Do not drive yourself to the hospital. Summary Scotoma is an area of your vision where you cannot see as well or cannot see at all. This is sometimes called a blind spot. When you are reading or doing other activities that use small objects or involve small text, use good lighting and magnifying devices. Keep all follow-up visits. This information is not intended to replace advice given to you by your health care provider. Make sure you discuss any questions you have with your health care provider. Document Revised: 08/06/2020 Document Reviewed: 08/06/2020 Elsevier Patient Education  [place].

## 2022-01-22 ENCOUNTER — Ambulatory Visit (INDEPENDENT_AMBULATORY_CARE_PROVIDER_SITE_OTHER): Payer: Medicare Other | Admitting: Podiatry

## 2022-01-22 DIAGNOSIS — B351 Tinea unguium: Secondary | ICD-10-CM

## 2022-01-22 NOTE — Progress Notes (Signed)
Subjective:  Patient ID: [name] [name], female    DOB: 01/16/1956,  MRN: [id number]  Chief Complaint  Patient presents with   Foot Problem    thick and discolored great toe-left foot, callouses on the left and right hallux     66 y.o. female presents with the above complaint.  Patient presents with thickened elongated dystrophic mycotic nail left hallux.  Mild pain on palpation.  She would like to proceed forward.  She has not seen anyone me.  She would like to discuss treatment options for nail fungus.  She does not want to oral medication.  She is tried topical stuff but has not helped.   Review of Systems: Negative except as noted in the HPI. Denies N/V/F/Ch.  Past Medical History:  Diagnosis Date   Anxiety    husband also cancer pt   Cancer ([place])    lung   Complication of anesthesia    bronchospasms post-op (pulmonary did consult but pt was discharged later that day)  With last surg. she was given albuterol inhaler & had steroid with surg.     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 Swallow whole., Disp: 30 tablet, Rfl: 12   losartan (COZAAR) 50 MG tablet, Take 1 tablet (50 mg  total) by mouth daily., Disp: 90 tablet, Rfl: 3   rosuvastatin (CRESTOR) 10 MG tablet, TAKE ONE TABLET BY MOUTH DAILY, Disp: 90 tablet, Rfl: 2   Suvorexant (BELSOMRA) 10 MG TABS, Take 10 mg by mouth at bedtime as needed., Disp: 30 tablet, Rfl: 2   VENTOLIN HFA 108 (90 Base) MCG/ACT inhaler, INHALE 1 PUFF BY MOUTH THREE TIMES DAILY AS NEEDED FOR WHEEZING OR SHORTNESS OF BREATH, Disp: 18 g, Rfl: 9   Vitamin D, Ergocalciferol, (DRISDOL) 50000 units CAPS capsule, Take 50,000 Units by mouth every 7 (seven) days., Disp: , Rfl:    nitroGLYCERIN (NITROSTAT) 0.4 MG SL tablet, Place 1 tablet (0.4 mg total) under the tongue every 5 (five) minutes as needed for chest pain., Disp: 25 tablet, Rfl: 6  Current Facility-Administered Medications:    Ampicillin-Sulbactam (UNASYN) 3 g in sodium chloride 0.9 % 100 mL IVPB, 3 g, Intravenous, Once, Avelina Laine, PA-C  Social History   Tobacco Use  Smoking Status Former   Packs/day: 0.50   Years: 40.00   Total pack years: 20.00   Types: Cigarettes   Quit date: 03/17/2013   Years since quitting: 8.8  Smokeless Tobacco Never    Allergies  Allergen Reactions   Advil [Ibuprofen]  Other (See Comments)    Causes hematuria    Augmentin [Amoxicillin-Pot Clavulanate] Nausea And Vomiting   Codeine Hives   Hydrocodone-Guaifenesin Itching   Sulfa Antibiotics Hives and Swelling   Temazepam Other (See Comments)    dizziness   Plasticized Base [Plastibase] Rash    tape   Zofran [Ondansetron Hcl] Palpitations   Objective:  There were no vitals filed for this visit. There is no height or weight on file to calculate BMI. Constitutional Well developed. Well nourished.  Vascular Dorsalis pedis pulses palpable bilaterally. Posterior tibial pulses palpable bilaterally. Capillary refill normal to all digits.  No cyanosis or clubbing noted. Pedal hair growth normal.  Neurologic Normal speech. Oriented to person, place, and time. Epicritic sensation to light  touch grossly present bilaterally.  Dermatologic Nails thickened elongated dystrophic mycotic toenails left hallux.  Mild pain on palpation. Skin within normal limits  Orthopedic: Normal joint ROM without pain or crepitus bilaterally. No visible deformities. No bony tenderness.   Radiographs: None Assessment:  No diagnosis found. Plan:  Patient was evaluated and treated and all questions answered.  Left hallux onychomycosis -Educated the patient on the etiology of onychomycosis and various treatment options associated with improving the fungal load.  I explained to the patient that there is 3 treatment options available to treat the onychomycosis including topical, p.o., laser treatment.  Patient elected to undergo laser therapy.  I discussed with her it can take 6-7 application.  She states understanding would like to proceed with laser   No follow-ups on file.

## 2022-02-01 ENCOUNTER — Encounter: Payer: Self-pay | Admitting: Interventional Cardiology

## 2022-02-01 ENCOUNTER — Ambulatory Visit: Payer: Medicare Other | Attending: Interventional Cardiology | Admitting: Interventional Cardiology

## 2022-02-01 VITALS — BP 146/88 | HR 96 | Ht 65.5 in | Wt 174.8 lb

## 2022-02-01 DIAGNOSIS — I25118 Atherosclerotic heart disease of native coronary artery with other forms of angina pectoris: Secondary | ICD-10-CM | POA: Diagnosis not present

## 2022-02-01 DIAGNOSIS — E785 Hyperlipidemia, unspecified: Secondary | ICD-10-CM | POA: Diagnosis not present

## 2022-02-01 DIAGNOSIS — I493 Ventricular premature depolarization: Secondary | ICD-10-CM | POA: Diagnosis not present

## 2022-02-01 DIAGNOSIS — Z79899 Other long term (current) drug therapy: Secondary | ICD-10-CM

## 2022-02-01 DIAGNOSIS — I10 Essential (primary) hypertension: Secondary | ICD-10-CM | POA: Diagnosis not present

## 2022-02-01 DIAGNOSIS — R0602 Shortness of breath: Secondary | ICD-10-CM

## 2022-02-01 DIAGNOSIS — J449 Chronic obstructive pulmonary disease, unspecified: Secondary | ICD-10-CM

## 2022-02-01 DIAGNOSIS — I5042 Chronic combined systolic (congestive) and diastolic (congestive) heart failure: Secondary | ICD-10-CM

## 2022-02-01 MED ORDER — SACUBITRIL-VALSARTAN 24-26 MG PO TABS: 1.0000 | ORAL_TABLET | Freq: Two times a day (BID) | ORAL | 11 refills | Status: DC

## 2022-02-01 NOTE — Progress Notes (Signed)
Cardiology Office Note:    Date:  02/01/2022   ID:  [name], [name] 02-08-1956, MRN [id number]  PCP:  [name], MD  Cardiologist:  Sinclair Grooms, MD   Referring MD: [name], MD   Chief Complaint  Patient presents with   Congestive Heart Failure   Hypertension    History of Present Illness:    [name] [name] is a 66 y.o. female with a hx of asymptomatic CAD denoted by coronary calcification with mild obstructive RCA disease by Cor CTA 2020, hyperlipidemia, hypertension, Lung CA , chronic combined systolic and diastolic HF, and prior smoking history.  Recent evaluation by Dr. Brett Fairy for central scotoma in summary diagnosis included: Cognitive decline; longstanding insomnia.  [name] [name] continues to gain weight.  Continues to complain of exertional dyspnea and has not been able to exercise due to left hip injury and fracture left foot.  She is taking her medications as prescribed.  Her only antihypertensive is Cozaar 50 mg/day.  Past Medical History:  Diagnosis Date   Anxiety    husband also cancer pt   Cancer ([place])    lung   Complication of anesthesia    bronchospasms post-op (pulmonary did consult but pt was discharged later that day)  With last surg. she was given albuterol inhaler & had steroid with surg.     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, transient, takes Verapimil for migraine & BP control    PONV (postoperative nausea and vomiting)    PVC (premature ventricular contraction)    Shortness of breath dyspnea    with exertion   Thyroid nodule    benign by biopsy    Varicose veins    left leg   Visual disturbance    Episodic   Vitamin D deficiency    Wears glasses     Past Surgical History:  Procedure Laterality Date   CARPAL TUNNEL RELEASE Left    CESAREAN SECTION     COLONOSCOPY W/ BIOPSIES AND POLYPECTOMY     DILATION AND CURETTAGE OF UTERUS     TONSILLECTOMY     VARICOSE VEIN SURGERY     left   VIDEO ASSISTED THORACOSCOPY (VATS)/ LOBECTOMY Right 07/23/2014   Procedure: VIDEO ASSISTED THORACOSCOPY (VATS)/ LOBECTOMY;  Surgeon: Ivin Poot, MD;  Location: [facility];  Service: Thoracic;  Laterality: Right;   VIDEO BRONCHOSCOPY N/A 07/23/2014   Procedure: VIDEO BRONCHOSCOPY;  Surgeon: Ivin Poot, MD;  Location: [facility];  Service: Thoracic;  Laterality: N/A;   VIDEO BRONCHOSCOPY WITH ENDOBRONCHIAL ULTRASOUND Right 04/17/2014   Procedure: VIDEO BRONCHOSCOPY WITH ENDOBRONCHIAL ULTRASOUND;  Surgeon: Collene Gobble, MD;  Location: [facility];  Service: Thoracic;  Laterality: Right;   VIDEO BRONCHOSCOPY WITH ENDOBRONCHIAL ULTRASOUND N/A 07/08/2014   Procedure: VIDEO BRONCHOSCOPY WITH ENDOBRONCHIAL ULTRASOUND;  Surgeon: Ivin Poot, MD;  Location: [facility];  Service: Thoracic;  Laterality: N/A;    Current Medications: Current Meds  Medication Sig   aspirin EC 81 MG tablet Take 1 tablet (81 mg total) by mouth daily. Swallow whole.  rosuvastatin (CRESTOR) 10 MG tablet TAKE ONE TABLET BY MOUTH DAILY   sacubitril-valsartan (ENTRESTO) 24-26 MG Take 1 tablet by mouth 2 (two) times daily.   Suvorexant (BELSOMRA) 10 MG TABS Take 10 mg by mouth at bedtime as needed.   VENTOLIN HFA 108 (90 Base) MCG/ACT inhaler INHALE 1 PUFF BY MOUTH THREE TIMES DAILY AS NEEDED FOR WHEEZING OR SHORTNESS OF BREATH   Vitamin D, Ergocalciferol, (DRISDOL) 50000 units CAPS capsule Take 50,000 Units by mouth every 7 (seven) days.   [DISCONTINUED] losartan (COZAAR) 50 MG tablet Take 1 tablet (50 mg total) by mouth daily.   Current Facility-Administered Medications for the 02/01/22  encounter (Office Visit) with Belva Crome, MD  Medication   Ampicillin-Sulbactam (UNASYN) 3 g in sodium chloride 0.9 % 100 mL IVPB     Allergies:   Advil [ibuprofen], Augmentin [amoxicillin-pot clavulanate], Codeine, Hydrocodone-guaifenesin, Sulfa antibiotics, Temazepam, Plasticized base [plastibase], and Zofran Alvis Lemmings hcl]   Social History   Socioeconomic History   Marital status: Widowed    Spouse name: Not on file   Number of children: 1   Years of education: 52   Highest education level: Associate degree: academic program  Occupational History   Occupation: Optician, dispensing: [employer]  Tobacco Use   Smoking status: Former    Packs/day: 0.50    Years: 40.00    Total pack years: 20.00    Types: Cigarettes    Quit date: 03/17/2013    Years since quitting: 8.8   Smokeless tobacco: Never  Vaping Use   Vaping Use: Never used  Substance and Sexual Activity   Alcohol use: Yes    Alcohol/week: 0.0 standard drinks of alcohol    Comment: " rare"   Drug use: No   Sexual activity: Not on file  Other Topics Concern   Not on file  Social History Narrative   Lives w daughter   Caffeine use: 1-2 cups coffee per day   Right handed    Social Determinants of Health   Financial Resource Strain: Not on file  Food Insecurity: Not on file  Transportation Needs: Not on file  Physical Activity: Not on file  Stress: Not on file  Social Connections: Not on file     Family History: The patient's family history includes Atrial fibrillation in her father; Cancer in her mother; Diverticulitis in her brother and father; Headache in her sister; Hypertension in her brother, father, sister, and sister.  ROS:   Please see the history of present illness.    Urinary frequency.  She is concerned about having 1 kidney.  She has never allow me to use diuretic therapy.  Any other medication that has any chance of causing increased urinary frequency or kidney injury it is difficult  to get in agreement from her to use.  She is not smoking.  She is surveillance going on for her lung cancer diagnosis.  Dr. Lorna Few is her oncologist.  All other systems reviewed and are negative.  EKGs/Labs/Other Studies Reviewed:    The following studies were reviewed today:  2D Doppler echocardiogram Oct 30, 2020: IMPRESSIONS    1. Left ventricular ejection fraction, by estimation, is 40 to 45%. The  left ventricle has mildly decreased function. The left ventricle  demonstrates global hypokinesis. Left ventricular diastolic parameters are  consistent with Grade I diastolic  dysfunction (impaired relaxation).   2. Right ventricular systolic function is mildly reduced. The right  ventricular size is normal. Tricuspid regurgitation signal  is inadequate  for assessing PA pressure.   3. The mitral valve is normal in structure. Mild mitral valve  regurgitation. No evidence of mitral stenosis.   4. The aortic valve is tricuspid. Aortic valve regurgitation is not  visualized. Mild aortic valve sclerosis is present, with no evidence of  aortic valve stenosis.   5. The inferior vena cava is normal in size with greater than 50%  respiratory variability, suggesting right atrial pressure of 3 mmHg.   EKG:  EKG demonstrates sinus rhythm at 96 bpm, normal PR, PVC.  Otherwise unremarkable.  Recent Labs: 06/24/2021: TSH 0.772 12/17/2021: ALT 7; BUN 16; Creatinine, Ser 0.68; Hemoglobin 15.9; Platelets 321; Potassium 3.8; Sodium 138  Recent Lipid Panel    Component Value Date/Time   CHOL 127 11/14/2020 1029   TRIG 46 11/14/2020 1029   HDL 68 11/14/2020 1029   CHOLHDL 1.9 11/14/2020 1029   LDLCALC 48 11/14/2020 1029    Physical Exam:    VS:  BP (!) 146/88   Pulse 96   Ht 5' 5.5" (1.664 m)   Wt 174 lb 12.8 oz (79.3 kg)   SpO2 95%   BMI 28.65 kg/m     Wt Readings from Last 3 Encounters:  02/01/22 174 lb 12.8 oz (79.3 kg)  01/21/22 177 lb (80.3 kg)  12/17/21 170 lb (77.1 kg)      GEN: Her weight is up.. No acute distress HEENT: Normal NECK: No JVD. LYMPHATICS: No lymphadenopathy CARDIAC: No murmur. RRR no gallop, or edema. VASCULAR:  Normal Pulses. No bruits. RESPIRATORY:  Clear to auscultation without rales, wheezing or rhonchi  ABDOMEN: Soft, non-tender, non-distended, No pulsatile mass, MUSCULOSKELETAL: No deformity  SKIN: Warm and dry NEUROLOGIC:  Alert and oriented x 3 PSYCHIATRIC:  Normal affect   ASSESSMENT:    1. Coronary artery disease of native artery of native heart with stable angina pectoris ([place])   2. SOB (shortness of breath)   3. Chronic combined systolic and diastolic heart failure ([place])   4. Hyperlipidemia with target LDL less than 70   5. Primary hypertension   6. Chronic obstructive pulmonary disease, unspecified COPD type ([place])   7. Frequent PVCs   8. Medication management    PLAN:    In order of problems listed above:  No angina although she could have anginal equivalent dyspnea. Likely multifactorial related to COPD, lung radiation, and heart failure. Blood pressure is poorly controlled.  Discontinue losartan and start Entresto 24/26 mg p.o. twice daily in a.m.  Basic metabolic panel in 7 to 10 days.  We need to start SGLT2 therapy but she resists.  MRA therapy is also being resistant.  We will likely add low-dose beta-blocker therapy on return. Crestor 10 mg/day should be continued.  Follow-up labs have been performed.  Most recent LDL was 60 07 November 2021. Target blood pressure 130/80.  Uptitrating Delene Loll will do a better job of blood pressure control than losartan. Follow-up with pulmonary Having PVCs noted today.   When she is on a good heart failure regimen, we will need to repeat 2D Doppler echocardiogram sometime before the year is out.  Basic metabolic panel as outlined above.  Clinical follow-up in 1 month.  Add beta-blocker at that time and consider SGLT2.  Medication Adjustments/Labs and Tests Ordered: Current  medicines are reviewed at length with the patient today.  Concerns regarding medicines are outlined above.  Orders Placed This Encounter  Procedures   Basic metabolic panel   EKG [id number]  Meds ordered this encounter  Medications   sacubitril-valsartan (ENTRESTO) 24-26 MG    Sig: Take 1 tablet by mouth 2 (two) times daily.    Dispense:  60 tablet    Refill:  11    Please Honor Card patient is presenting for Carmie Kanner: [id number]; Juanna Cao: [phone]; SELTR: [facility]; VUYE: [phone]    Patient Instructions  Medication Instructions:  Your physician has recommended you make the following change in your medication:   1) STOP Losartan 2) START Entresto 24-26mg  twice daily  *If you need a refill on your cardiac medications before your next appointment, please call your pharmacy*  Lab Work: In 7-10 days: BMET If you have labs (blood work) drawn today and your tests are completely normal, you will receive your results only by: [place] (if you have MyChart) OR A paper copy in the mail If you have any lab test that is abnormal or we need to change your treatment, we will call you to review the results.  Testing/Procedures: NONE  Follow-Up: At [facility], you and your health needs are our priority.  As part of our continuing mission to provide you with exceptional heart care, we have created designated Provider Care Teams.  These Care Teams include your primary Cardiologist (physician) and Advanced Practice Providers (APPs -  Physician Assistants and Nurse Practitioners) who all work together to provide you with the care you need, when you need it.  Your next appointment:   1 month(s)  The format for your next appointment:   In Person  Provider:   Sinclair Grooms, MD      Important Information About Sugar         Signed, Sinclair Grooms, MD  02/01/2022 5:13 PM    [place]

## 2022-02-01 NOTE — Patient Instructions (Signed)
Medication Instructions:  Your physician has recommended you make the following change in your medication:   1) STOP Losartan 2) START Entresto 24-26mg  twice daily  *If you need a refill on your cardiac medications before your next appointment, please call your pharmacy*  Lab Work: In 7-10 days: BMET If you have labs (blood work) drawn today and your tests are completely normal, you will receive your results only by: [place] (if you have MyChart) OR A paper copy in the mail If you have any lab test that is abnormal or we need to change your treatment, we will call you to review the results.  Testing/Procedures: NONE  Follow-Up: At [facility], you and your health needs are our priority.  As part of our continuing mission to provide you with exceptional heart care, we have created designated Provider Care Teams.  These Care Teams include your primary Cardiologist (physician) and Advanced Practice Providers (APPs -  Physician Assistants and Nurse Practitioners) who all work together to provide you with the care you need, when you need it.  Your next appointment:   1 month(s)  The format for your next appointment:   In Person  Provider:   Sinclair Grooms, MD      Important Information About Sugar

## 2022-02-09 ENCOUNTER — Ambulatory Visit (INDEPENDENT_AMBULATORY_CARE_PROVIDER_SITE_OTHER): Payer: Medicare Other

## 2022-02-09 DIAGNOSIS — B351 Tinea unguium: Secondary | ICD-10-CM

## 2022-02-09 NOTE — Patient Instructions (Signed)

## 2022-02-09 NOTE — Progress Notes (Signed)
Patient presents today for the 1st laser treatment. Diagnosed with mycotic nail infection by Dr. Posey Pronto.   Toenail most affected 1st left.  All other systems are negative.  Nails were filed thin. Laser therapy was administered to 1st toenails left and patient tolerated the treatment well. All safety precautions were in place.   Single laser pass was done on non-affected nails.   Follow up in 4 weeks for laser # 2.

## 2022-02-10 ENCOUNTER — Ambulatory Visit: Payer: Medicare Other | Attending: Cardiology

## 2022-02-10 DIAGNOSIS — I10 Essential (primary) hypertension: Secondary | ICD-10-CM | POA: Insufficient documentation

## 2022-02-10 DIAGNOSIS — I5042 Chronic combined systolic (congestive) and diastolic (congestive) heart failure: Secondary | ICD-10-CM | POA: Diagnosis not present

## 2022-02-10 DIAGNOSIS — Z79899 Other long term (current) drug therapy: Secondary | ICD-10-CM | POA: Diagnosis not present

## 2022-02-10 LAB — BASIC METABOLIC PANEL
BUN/Creatinine Ratio: 19 (ref 12–28)
BUN: 15 mg/dL (ref 8–27)
CO2: 22 mmol/L (ref 20–29)
Calcium: 9.6 mg/dL (ref 8.7–10.3)
Chloride: 105 mmol/L (ref 96–106)
Creatinine, Ser: 0.78 mg/dL (ref 0.57–1.00)
Glucose: 102 mg/dL — ABNORMAL HIGH (ref 70–99)
Potassium: 4.5 mmol/L (ref 3.5–5.2)
Sodium: 142 mmol/L (ref 134–144)
eGFR: 83 mL/min/{1.73_m2} (ref >59)

## 2022-02-12 ENCOUNTER — Other Ambulatory Visit: Payer: Medicare Other

## 2022-02-15 ENCOUNTER — Ambulatory Visit
Admission: RE | Admit: 2022-02-15 | Discharge: 2022-02-15 | Disposition: A | Discharge status: Home / Self Care | Payer: Medicare Other | Source: Ambulatory Visit | Attending: Internal Medicine | Admitting: Internal Medicine

## 2022-02-15 ENCOUNTER — Ambulatory Visit: Payer: Medicare Other | Admitting: Internal Medicine

## 2022-02-15 ENCOUNTER — Inpatient Hospital Stay: Payer: Medicare Other | Attending: Internal Medicine

## 2022-02-15 DIAGNOSIS — Z9221 Personal history of antineoplastic chemotherapy: Secondary | ICD-10-CM | POA: Insufficient documentation

## 2022-02-15 DIAGNOSIS — J432 Centrilobular emphysema: Secondary | ICD-10-CM | POA: Diagnosis not present

## 2022-02-15 DIAGNOSIS — Z902 Acquired absence of lung [part of]: Secondary | ICD-10-CM | POA: Insufficient documentation

## 2022-02-15 DIAGNOSIS — C3490 Malignant neoplasm of unspecified part of unspecified bronchus or lung: Secondary | ICD-10-CM

## 2022-02-15 DIAGNOSIS — I700 Atherosclerosis of aorta: Secondary | ICD-10-CM | POA: Diagnosis not present

## 2022-02-15 DIAGNOSIS — C3431 Malignant neoplasm of lower lobe, right bronchus or lung: Secondary | ICD-10-CM | POA: Diagnosis not present

## 2022-02-15 DIAGNOSIS — Z923 Personal history of irradiation: Secondary | ICD-10-CM | POA: Diagnosis not present

## 2022-02-15 DIAGNOSIS — I2510 Atherosclerotic heart disease of native coronary artery without angina pectoris: Secondary | ICD-10-CM | POA: Diagnosis not present

## 2022-02-15 LAB — CBC WITH DIFFERENTIAL (CANCER CENTER ONLY)
Abs Immature Granulocytes: 0.01 10*3/uL (ref 0.00–0.07)
Basophils Absolute: 0 10*3/uL (ref 0.0–0.1)
Basophils Relative: 1 %
Eosinophils Absolute: 0.2 10*3/uL (ref 0.0–0.5)
Eosinophils Relative: 3 %
HCT: 43.8 % (ref 36.0–46.0)
Hemoglobin: 14.5 g/dL (ref 12.0–15.0)
Immature Granulocytes: 0 %
Lymphocytes Relative: 25 %
Lymphs Abs: 1.6 10*3/uL (ref 0.7–4.0)
MCH: 30 pg (ref 26.0–34.0)
MCHC: 33.1 g/dL (ref 30.0–36.0)
MCV: 90.7 fL (ref 80.0–100.0)
Monocytes Absolute: 0.6 10*3/uL (ref 0.1–1.0)
Monocytes Relative: 9 %
Neutro Abs: 4 10*3/uL (ref 1.7–7.7)
Neutrophils Relative %: 62 %
Platelet Count: 270 10*3/uL (ref 150–400)
RBC: 4.83 MIL/uL (ref 3.87–5.11)
RDW: 12.4 % (ref 11.5–15.5)
WBC Count: 6.5 10*3/uL (ref 4.0–10.5)
nRBC: 0 % (ref 0.0–0.2)

## 2022-02-15 LAB — CMP (CANCER CENTER ONLY)
ALT: 11 U/L (ref 0–44)
AST: 16 U/L (ref 15–41)
Albumin: 4.3 g/dL (ref 3.5–5.0)
Alkaline Phosphatase: 95 U/L (ref 38–126)
Anion gap: 4 — ABNORMAL LOW (ref 5–15)
BUN: 16 mg/dL (ref 8–23)
CO2: 29 mmol/L (ref 22–32)
Calcium: 9.8 mg/dL (ref 8.9–10.3)
Chloride: 106 mmol/L (ref 98–111)
Creatinine: 0.84 mg/dL (ref 0.44–1.00)
GFR, Estimated: >60 mL/min (ref >60)
Glucose, Bld: 117 mg/dL — ABNORMAL HIGH (ref 70–99)
Potassium: 4.5 mmol/L (ref 3.5–5.1)
Sodium: 139 mmol/L (ref 135–145)
Total Bilirubin: 0.6 mg/dL (ref 0.3–1.2)
Total Protein: 7.1 g/dL (ref 6.5–8.1)

## 2022-02-15 MED ORDER — IOPAMIDOL (ISOVUE-300) INJECTION 61%
75.0000 mL | Freq: Once | INTRAVENOUS | Status: AC | PRN
  Administered 2022-02-15: 75 mL via INTRAVENOUS

## 2022-02-17 ENCOUNTER — Ambulatory Visit: Payer: Medicare Other | Admitting: Internal Medicine

## 2022-02-17 DIAGNOSIS — N3941 Urge incontinence: Secondary | ICD-10-CM | POA: Diagnosis not present

## 2022-02-17 DIAGNOSIS — R351 Nocturia: Secondary | ICD-10-CM | POA: Diagnosis not present

## 2022-02-17 DIAGNOSIS — R350 Frequency of micturition: Secondary | ICD-10-CM | POA: Diagnosis not present

## 2022-02-18 ENCOUNTER — Inpatient Hospital Stay (HOSPITAL_BASED_OUTPATIENT_CLINIC_OR_DEPARTMENT_OTHER): Payer: Medicare Other | Admitting: Internal Medicine

## 2022-02-18 VITALS — BP 142/83 | HR 80 | Temp 97.9°F | Resp 15 | Wt 173.7 lb

## 2022-02-18 DIAGNOSIS — C3431 Malignant neoplasm of lower lobe, right bronchus or lung: Secondary | ICD-10-CM | POA: Diagnosis not present

## 2022-02-18 DIAGNOSIS — Z9221 Personal history of antineoplastic chemotherapy: Secondary | ICD-10-CM | POA: Diagnosis not present

## 2022-02-18 DIAGNOSIS — Z923 Personal history of irradiation: Secondary | ICD-10-CM | POA: Diagnosis not present

## 2022-02-18 DIAGNOSIS — C3490 Malignant neoplasm of unspecified part of unspecified bronchus or lung: Secondary | ICD-10-CM

## 2022-02-18 DIAGNOSIS — Z902 Acquired absence of lung [part of]: Secondary | ICD-10-CM | POA: Diagnosis not present

## 2022-02-18 NOTE — Progress Notes (Signed)
[place] [phone]   [phone]  OFFICE PROGRESS NOTE  [name], [place] [address]  DIAGNOSIS: Primary cancer of right lower lobe of lung   Staging form: Lung, AJCC 7th Edition     Clinical: Stage IIIA (T2a, N2, M0) - Unsigned       Staging comments: Adenocarcinoma  Foundation 1 molecular studies: positive for [id number], KRASG13C, [id number]*, [id number]*11, GATA6 amplification and [id number] Negative for BRAF, ALK, MET, RET and ERBB2  PRIOR THERAPY:  1) Concurrent chemoradiation with chemotherapy in the form of weekly carboplatin for an AUC of 2 and paclitaxel 45 mg/m given concurrent with radiation 2) Right VATS (video-assisted thoracoscopic surgery) with right lower lobectomy and mediastinal lymph node dissection under the care of [name] 07/23/2014. The final pathology showed residual 1.8 cm moderate to poorly differentiated invasive adenocarcinoma with no pleural or lymphovascular invasion. The dissected lymph nodes were negative for malignancy and the final pathologic stage was (pT1a, pN0, pMx).  CURRENT THERAPY: Observation.   INTERVAL HISTORY: [name] Walla89y.o. female returns to the clinic today for annual follow-up visit.  The patient is feeling fine today with no concerning complaints except for mild fatigue.  She has no chest pain, shortness of breath, cough or hemoptysis.  She has no nausea, vomiting, diarrhea or constipation.  She has no headache but has some visual changes and followed by ophthalmology.  She has no recent weight loss or night sweats.  She is here today for evaluation with repeat CT scan of the chest for restaging of her disease.  MEDICAL HISTORY:  Past Medical History:  Diagnosis Date   Anxiety    husband also cancer pt   Cancer ([place]    lung   Complication of anesthesia    bronchospasms post-op (pulmonary did consult but pt was discharged later that day)   With last surg. she was given albuterol inhaler & had steroid with surg.     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, transient, takes Verapimil for migraine & BP control    PONV (postoperative nausea and vomiting)    PVC (premature ventricular contraction)    Shortness of breath dyspnea    with exertion   Thyroid nodule    benign by biopsy   Varicose veins    left leg   Visual disturbance    Episodic   Vitamin D deficiency    Wears glasses     ALLERGIES:  is allergic to advil [ibuprofen], augmentin [amoxicillin-pot clavulanate], codeine, hydrocodone-guaifenesin, sulfa antibiotics, temazepam, plasticized base [plastibase], and zofran [ondansetron hcl].  MEDICATIONS:  Current Outpatient Medications  Medication Sig Dispense Refill   aspirin EC 81 MG tablet Take 1 tablet (81 mg total) by mouth daily. Swallow whole. 30 tablet 12   nitroGLYCERIN (NITROSTAT) 0.4 MG SL tablet Place 1 tablet (0.4 mg total) under the tongue every 5 (five) minutes as needed for chest pain. 25 tablet 6   rosuvastatin (CRESTOR) 10 MG tablet TAKE ONE TABLET BY MOUTH DAILY 90  tablet 2   sacubitril-valsartan (ENTRESTO) 24-26 MG Take 1 tablet by mouth 2 (two) times daily. 60 tablet 11   Suvorexant (BELSOMRA) 10 MG TABS Take 10 mg by mouth at bedtime as needed. 30 tablet 2   VENTOLIN HFA 108 (90 Base) MCG/ACT inhaler INHALE 1 PUFF BY MOUTH THREE TIMES DAILY AS NEEDED FOR WHEEZING OR SHORTNESS OF BREATH 18 g 9   Vitamin D, Ergocalciferol, (DRISDOL) 50000 units CAPS capsule Take 50,000 Units by mouth every 7 (seven) days.     Current Facility-Administered  Medications  Medication Dose Route Frequency Provider Last Rate Last Admin   Ampicillin-Sulbactam (UNASYN) 3 g in sodium chloride 0.9 % 100 mL IVPB  3 g Intravenous Once Avelina Laine, PA-C        SURGICAL HISTORY:  Past Surgical History:  Procedure Laterality Date   CARPAL TUNNEL RELEASE Left    CESAREAN SECTION     COLONOSCOPY W/ BIOPSIES AND POLYPECTOMY     DILATION AND CURETTAGE OF UTERUS     TONSILLECTOMY     VARICOSE VEIN SURGERY     left   VIDEO ASSISTED THORACOSCOPY (VATS)/ LOBECTOMY Right 07/23/2014   Procedure: VIDEO ASSISTED THORACOSCOPY (VATS)/ LOBECTOMY;  Surgeon: Ivin Poot, MD;  Location: [facility];  Service: Thoracic;  Laterality: Right;   VIDEO BRONCHOSCOPY N/A 07/23/2014   Procedure: VIDEO BRONCHOSCOPY;  Surgeon: Ivin Poot, MD;  Location: [facility];  Service: Thoracic;  Laterality: N/A;   VIDEO BRONCHOSCOPY WITH ENDOBRONCHIAL ULTRASOUND Right 04/17/2014   Procedure: VIDEO BRONCHOSCOPY WITH ENDOBRONCHIAL ULTRASOUND;  Surgeon: Collene Gobble, MD;  Location: [facility];  Service: Thoracic;  Laterality: Right;   VIDEO BRONCHOSCOPY WITH ENDOBRONCHIAL ULTRASOUND N/A 07/08/2014   Procedure: VIDEO BRONCHOSCOPY WITH ENDOBRONCHIAL ULTRASOUND;  Surgeon: Ivin Poot, MD;  Location[facility];  Service: Thoracic;  Laterality: N/A;    REVIEW OF SYSTEMS:  A comprehensive review of systems was negative except for: Constitutional: positive for fatigue   PHYSICAL EXAMINATION: General appearance: alert, cooperative and no distress Head: Normocephalic, without obvious abnormality, atraumatic Neck: no adenopathy, no JVD, supple, symmetrical, trachea midline and thyroid not enlarged, symmetric, no tenderness/mass/nodules Lymph nodes: Cervical, supraclavicular, and axillary nodes normal. Resp: clear to auscultation bilaterally Back: symmetric, no curvature. ROM normal. No CVA tenderness. Cardio: regular rate and rhythm, S1, S2 normal, no murmur, click, rub or gallop GI: soft, non-tender;  bowel sounds normal; no masses,  no organomegaly Extremities: extremities normal, atraumatic, no cyanosis or edema  ECOG PERFORMANCE STATUS: 0 - Asymptomatic  Blood pressure (!) 142/83, pulse 80, temperature 97.9 F (36.6 C), temperature source Oral, resp. rate 15, weight 173 lb 11.2 oz (78.8 kg), SpO2 98 %.  LABORATORY DATA: Lab Results  Component Value Date   WBC 6.5 02/15/2022   HGB 14.5 02/15/2022   HCT 43.8 02/15/2022   MCV 90.7 02/15/2022   PLT 270 02/15/2022      Chemistry      Component Value Date/Time   NA 139 02/15/2022 1000   NA 142 02/10/2022 1206   NA 142 01/19/2017 0958   K 4.5 02/15/2022 1000   K 4.3 01/19/2017 0958   CL 106 02/15/2022 1000   CO2 29 02/15/2022 1000   CO2 25 01/19/2017 0958   BUN 16 02/15/2022 1000   BUN 15 02/10/2022 1206   BUN 15.2 01/19/2017 0958   CREATININE 0.84 02/15/2022 1000   CREATININE 0.8 01/19/2017 0958      Component Value Date/Time   CALCIUM 9.8 02/15/2022 1000  CALCIUM 9.5 01/19/2017 0958   ALKPHOS 95 02/15/2022 1000   ALKPHOS 121 01/19/2017 0958   AST 16 02/15/2022 1000   AST 19 01/19/2017 0958   ALT 11 02/15/2022 1000   ALT 20 01/19/2017 0958   BILITOT 0.6 02/15/2022 1000   BILITOT 0.64 01/19/2017 0958       RADIOGRAPHIC STUDIES: CT Chest W Contrast  Result Date: 02/16/2022 CLINICAL DATA:  Non-small cell lung cancer restaging, status post right lower lobectomy, chemotherapy and radiation * Tracking Code: BO * EXAM: CT CHEST WITH CONTRAST TECHNIQUE: Multidetector CT imaging of the chest was performed during intravenous contrast administration. RADIATION DOSE REDUCTION: This exam was performed according to the departmental dose-optimization program which includes automated exposure control, adjustment of the mA and/or kV according to patient size and/or use of iterative reconstruction technique. CONTRAST:  56m ISOVUE-300 IOPAMIDOL (ISOVUE-300) INJECTION 61% COMPARISON:  02/12/2021 FINDINGS: Cardiovascular: Aortic  atherosclerosis. Normal heart size. Left and right coronary artery calcifications. No pericardial effusion. Mediastinum/Nodes: Unchanged prominent left axillary lymph node, measuring 0.9 x 0.8 cm (series 2, image 29). No overtly enlarged mediastinal, hilar, or axillary lymph nodes. Unchanged low intermediate attenuation pericardial recess in the pretracheal space (series 2, image 38, 45). Thyroid gland, trachea, and esophagus demonstrate no significant findings. Lungs/Pleura: Unchanged postoperative findings status post right lower lobectomy. Moderate centrilobular emphysema. Bandlike scarring of the left lung base and right middle lobe, unchanged (series 8, image 88). No pleural effusion or pneumothorax. Upper Abdomen: No acute abnormality.  Colonic diverticula. Musculoskeletal: No chest wall abnormality. No acute osseous findings. IMPRESSION: 1. Unchanged postoperative findings status post right lower lobectomy. No evidence of recurrent or metastatic disease in the chest. 2. Emphysema. 3. Coronary artery disease. Aortic Atherosclerosis (ICD10-I70.0) and Emphysema (ICD10-J43.9). Electronically Signed   By: ADelanna AhmadiM.D.   On: 02/16/2022 09:19     ASSESSMENT AND PLAN:  This is a very pleasant 66years old white female with a stage IIIA non-small cell lung cancer status post neoadjuvant concurrent chemoradiation with weekly carboplatin and paclitaxel followed by right lower lobectomy and lymph node dissection with the final pathology consistent with a pathological stage pT1a, pN0. The patient has been on observation since 2016. The patient is feeling fine today with no concerning complaints except for mild fatigue.  He had repeat CT scan of the chest performed recently.  I personally and independently reviewed the scans and discussed the result with the patient today. Her scan showed no concerning findings for disease recurrence or metastasis. I recommended for her to continue on observation with repeat  CT scan of the chest in 1 year. She was advised to call immediately if she has any other concerning symptoms in the interval. The patient voices understanding of current disease status and treatment options and is in agreement with the current care plan. All questions were answered. The patient knows to call the clinic with any problems, questions or concerns. We can certainly see the patient much sooner if necessary.  Disclaimer: This note was dictated with voice recognition software. Similar sounding words can inadvertently be transcribed and may not be corrected upon review.

## 2022-03-01 ENCOUNTER — Encounter: Payer: Self-pay | Admitting: Interventional Cardiology

## 2022-03-07 NOTE — Progress Notes (Unsigned)
Cardiology Office Note:    Date:  03/08/2022   ID:  [name] [name], DOB 1955-11-27, MRN [id number]  PCP:  [name], MD  Cardiologist:  Sinclair Grooms, MD   Referring MD: [name], MD   Chief Complaint  Patient presents with   Coronary Artery Disease   Hyperlipidemia   Hypertension    History of Present Illness:    [name] [name] is a 66 y.o. female with a hx of asymptomatic CAD denoted by coronary calcification with mild obstructive RCA disease by Cor CTA 2020, hyperlipidemia, hypertension, Lung CA , chronic combined systolic and diastolic HF(EF 29-52% 8413) , and prior smoking history  She has pulmonary disease, dyspnea on exertion, and reluctance to use the diuretics because she already has a nocturia issue.  EF is reduced.  We will try and institute a guideline directed regimen for preservation of LV function.  Low-dose Delene Loll has been started.  Because she complains of shortness of breath my thought was to add an SGLT2 although she objects to this.  Today we have decided to add carvedilol 3.125 mg p.o. twice daily.  He is not able to exercise because of plantar fasciitis.  Because she is unable to exercise her weight has gradually increased.  Past Medical History:  Diagnosis Date   Anxiety    husband also cancer pt   Cancer ([place])    lung   Complication of anesthesia    bronchospasms post-op (pulmonary did consult but pt was discharged later that day)  With last surg. she was given albuterol inhaler & had steroid with surg.     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, transient, takes  Verapimil for migraine & BP control    PONV (postoperative nausea and vomiting)    PVC (premature ventricular contraction)    Shortness of breath dyspnea    with exertion   Thyroid nodule    benign by biopsy   Varicose veins    left leg   Visual disturbance    Episodic   Vitamin D deficiency    Wears glasses     Past Surgical History:  Procedure Laterality Date   CARPAL TUNNEL RELEASE Left    CESAREAN SECTION     COLONOSCOPY W/ BIOPSIES AND POLYPECTOMY     DILATION AND CURETTAGE OF UTERUS     TONSILLECTOMY     VARICOSE VEIN SURGERY     left   VIDEO ASSISTED THORACOSCOPY (VATS)/ LOBECTOMY Right 07/23/2014   Procedure: VIDEO ASSISTED THORACOSCOPY (VATS)/ LOBECTOMY;  Surgeon: Ivin Poot, MD;  Location: [facility];  [name]: Thoracic;  Laterality: Right;   VIDEO BRONCHOSCOPY N/A 07/23/2014   Procedure: VIDEO BRONCHOSCOPY;  Surgeon: Ivin Poot, MD;  Location: [facility];  [name]: Thoracic;  Laterality: N/A;   VIDEO BRONCHOSCOPY WITH ENDOBRONCHIAL ULTRASOUND Right 04/17/2014   Procedure: VIDEO BRONCHOSCOPY WITH ENDOBRONCHIAL ULTRASOUND;  Surgeon: Collene Gobble, MD;  Location: [facility];  [name]: Thoracic;  Laterality: Right;   VIDEO BRONCHOSCOPY WITH ENDOBRONCHIAL ULTRASOUND N/A 07/08/2014   Procedure: VIDEO BRONCHOSCOPY WITH ENDOBRONCHIAL ULTRASOUND;  Surgeon: Ivin Poot, MD;  [facility];  [name]: Thoracic;  Laterality: N/A;    Current Medications: Current Meds  Medication Sig   aspirin EC 81 MG tablet Take 1 tablet (81 mg total) by mouth daily. Swallow whole.   carvedilol (COREG) 3.125 MG tablet Take 1 tablet (3.125 mg total) by mouth 2 (two) times daily.   loratadine (CLARITIN) 10 MG tablet Take 10 mg by mouth daily.   nitroGLYCERIN (NITROSTAT) 0.4 MG SL tablet Place 1 tablet (0.4 mg total) under the tongue every 5 (five) minutes as needed for chest pain.   rosuvastatin (CRESTOR) 10 MG tablet TAKE ONE TABLET BY MOUTH DAILY   sacubitril-valsartan (ENTRESTO) 24-26 MG Take 1  tablet by mouth 2 (two) times daily.   Suvorexant (BELSOMRA) 10 MG TABS Take 10 mg by mouth at bedtime as needed.   VENTOLIN HFA 108 (90 Base) MCG/ACT inhaler INHALE 1 PUFF BY MOUTH THREE TIMES DAILY AS NEEDED FOR WHEEZING OR SHORTNESS OF BREATH   Vitamin D, Ergocalciferol, (DRISDOL) 50000 units CAPS capsule Take 50,000 Units by mouth every 7 (seven) days.   Current Facility-Administered Medications for the 03/08/22 encounter (Office Visit) with Belva Crome, MD  Medication   Ampicillin-Sulbactam (UNASYN) 3 g in sodium chloride 0.9 % 100 mL IVPB     Allergies:   Advil [ibuprofen], Augmentin [amoxicillin-pot clavulanate], Codeine, Hydrocodone-guaifenesin, Sulfa antibiotics, Temazepam, Plasticized base [plastibase], and Zofran Alvis Lemmings hcl]   Social History   Socioeconomic History   Marital status: Widowed    Spouse name: Not on file   Number of children: 1   Years of education: 33   Highest education level: Associate degree: academic program  Occupational History   Occupation: Optician, dispensing: [employer]  Tobacco Use   Smoking status: Former    Packs/day: 0.50    Years: 40.00    Total pack years: 20.00    Types: Cigarettes    Quit date: 03/17/2013    Years since quitting: 8.9   Smokeless tobacco: Never  Vaping Use   Vaping Use: Never used  Substance and Sexual Activity   Alcohol use: Yes    Alcohol/week: 0.0 standard drinks of alcohol    Comment: " rare"   Drug use: No   Sexual activity: Not on file  Other Topics Concern   Not on file  Social History Narrative   Lives w daughter   Caffeine use: 1-2 cups coffee per day   Right handed    Social Determinants of Health   Financial Resource Strain: Not on file  Food Insecurity: Not on file  Transportation Needs: Not on file  Physical Activity: Not on file  Stress: Not on file  Social Connections: Not on file     Family History: The patient's family history includes Atrial fibrillation in her father;  Cancer in her mother; Diverticulitis in her brother and father; Headache in her sister; Hypertension in her brother, father, sister, and sister.  ROS:   Please see the history of present illness.    No specific complaints other than concerns about increased urinary frequency and trying to avoid medications that will it have a tendency to increase the need to urinate.  All other systems reviewed and are negative.  EKGs/Labs/Other Studies Reviewed:    The following studies were reviewed today: No new data other than the echo done earlier this year demonstrating EF in the mid reduced range.  EKG:  EKG not repeated  Recent Labs: 06/24/2021: TSH  0.772 02/15/2022: ALT 11; BUN 16; Creatinine 0.84; Hemoglobin 14.5; Platelet Count 270; Potassium 4.5; Sodium 139  Recent Lipid Panel    Component Value Date/Time   CHOL 127 11/14/2020 1029   TRIG 46 11/14/2020 1029   HDL 68 11/14/2020 1029   CHOLHDL 1.9 11/14/2020 1029   LDLCALC 48 11/14/2020 1029    Physical Exam:    VS:  BP 124/80   Pulse 94   Ht 5' 5.5" (1.664 m)   Wt 174 lb 3.2 oz (79 kg)   SpO2 96%   BMI 28.55 kg/m     Wt Readings from Last 3 Encounters:  03/08/22 174 lb 3.2 oz (79 kg)  02/18/22 173 lb 11.2 oz (78.8 kg)  02/01/22 174 lb 12.8 oz (79.3 kg)     GEN: Neck veins not distended. No acute distress HEENT: Normal NECK: No JVD. LYMPHATICS: No lymphadenopathy CARDIAC: No murmur. RRR no gallop, or edema. VASCULAR:  Normal Pulses. No bruits. RESPIRATORY:  Clear to auscultation without rales, wheezing or rhonchi  ABDOMEN: Soft, non-tender, non-distended, No pulsatile mass, MUSCULOSKELETAL: No deformity  SKIN: Warm and dry NEUROLOGIC:  Alert and oriented x 3 PSYCHIATRIC:  Normal affect   ASSESSMENT:    1. Coronary artery disease of native artery of native heart with stable angina pectoris ([place])   2. SOB (shortness of breath)   3. Chronic combined systolic and diastolic heart failure ([place])   4. Hyperlipidemia with  target LDL less than 70   5. Primary hypertension   6. Chronic obstructive pulmonary disease, unspecified COPD type ([place])    PLAN:    In order of problems listed above:  Stable on preventive therapy. Probably multifactorial including deconditioning, COPD, partial pneumonectomy, and diastolic heart failure. Add carvedilol 3.125 mg p.o. twice daily.  Wanted to start SGLT2 therapy but she objects to both MRA and SGLT2 as it would increase urinary output and aggravate her urinary incontinence.  2D Doppler echocardiogram will be performed in mid December with follow-up 1 to 2 weeks thereafter to see if there is any improvement in LV function on these relatively low doses of preventative therapy. Continue statin therapy Blood pressure is better controlled on her current agents.  Guideline directed therapy for left ventricular systolic dysfunction: Angiotensin receptor-neprilysin inhibitor (ARNI)-Entresto; beta-blocker therapy - carvedilol, metoprolol succinate, or bisoprolol; mineralocorticoid receptor antagonist (MRA) therapy -spironolactone or eplerenone.  SGLT-2 agents -  Dapagliflozin Wilder Glade) or Empagliflozin (Jardiance).These therapies have been shown to improve clinical outcomes including reduction of rehospitalization, survival, and acute heart failure.   She prefers to see Dr. Johney Frame or Dr. Irish Lack   Medication Adjustments/Labs and Tests Ordered: Current medicines are reviewed at length with the patient today.  Concerns regarding medicines are outlined above.  Orders Placed This Encounter  Procedures   ECHOCARDIOGRAM COMPLETE   Meds ordered this encounter  Medications   carvedilol (COREG) 3.125 MG tablet    Sig: Take 1 tablet (3.125 mg total) by mouth 2 (two) times daily.    Dispense:  180 tablet    Refill:  3    Patient Instructions  Medication Instructions:  Your physician has recommended you make the following change in your medication:   1) START Carvedilol (Coreg)  3.125mg  twice daily  *If you need a refill on your cardiac medications before your next appointment, please call your pharmacy*  Lab Work: NONE  Testing/Procedures: Your physician has requested that you have an echocardiogram early December, needs to be done at least 1-2 weeks before follow-up appointment.  Echocardiography is a painless test that uses sound waves to create images of your heart. It provides your doctor with information about the size and shape of your heart and how well your heart's chambers and valves are working. This procedure takes approximately one hour. There are no restrictions for this procedure.  Follow-Up: At [facility], you and your health needs are our priority.  As part of our continuing mission to provide you with exceptional heart care, we have created designated Provider Care Teams.  These Care Teams include your primary Cardiologist (physician) and Advanced Practice Providers (APPs -  Physician Assistants and Nurse Practitioners) who all work together to provide you with the care you need, when you need it.  Your next appointment:   3 month(s)  The format for your next appointment:   In Person  Provider:   Sinclair Grooms, MD  or Gwyndolyn Kaufman, MD or Larae Grooms, MD   Important Information About Sugar         Signed, Sinclair Grooms, MD  03/08/2022 3:19 PM    [place]

## 2022-03-08 ENCOUNTER — Encounter: Payer: Self-pay | Admitting: Interventional Cardiology

## 2022-03-08 ENCOUNTER — Ambulatory Visit: Payer: Medicare Other | Attending: Interventional Cardiology | Admitting: Interventional Cardiology

## 2022-03-08 VITALS — BP 124/80 | HR 94 | Ht 65.5 in | Wt 174.2 lb

## 2022-03-08 DIAGNOSIS — I25118 Atherosclerotic heart disease of native coronary artery with other forms of angina pectoris: Secondary | ICD-10-CM

## 2022-03-08 DIAGNOSIS — J449 Chronic obstructive pulmonary disease, unspecified: Secondary | ICD-10-CM

## 2022-03-08 DIAGNOSIS — I10 Essential (primary) hypertension: Secondary | ICD-10-CM | POA: Diagnosis not present

## 2022-03-08 DIAGNOSIS — E785 Hyperlipidemia, unspecified: Secondary | ICD-10-CM

## 2022-03-08 DIAGNOSIS — I5042 Chronic combined systolic (congestive) and diastolic (congestive) heart failure: Secondary | ICD-10-CM | POA: Diagnosis not present

## 2022-03-08 DIAGNOSIS — R0602 Shortness of breath: Secondary | ICD-10-CM

## 2022-03-08 MED ORDER — CARVEDILOL 3.125 MG PO TABS: 3.1250 mg | ORAL_TABLET | Freq: Two times a day (BID) | ORAL | 3 refills | Status: DC

## 2022-03-08 NOTE — Patient Instructions (Signed)
Medication Instructions:  Your physician has recommended you make the following change in your medication:   1) START Carvedilol (Coreg) 3.125mg  twice daily  *If you need a refill on your cardiac medications before your next appointment, please call your pharmacy*  Lab Work: NONE  Testing/Procedures: Your physician has requested that you have an echocardiogram early December, needs to be done at least 1-2 weeks before follow-up appointment. Echocardiography is a painless test that uses sound waves to create images of your heart. It provides your doctor with information about the size and shape of your heart and how well your heart's chambers and valves are working. This procedure takes approximately one hour. There are no restrictions for this procedure.  Follow-Up: At [facility], you and your health needs are our priority.  As part of our continuing mission to provide you with exceptional heart care, we have created designated Provider Care Teams.  These Care Teams include your primary Cardiologist (physician) and Advanced Practice Providers (APPs -  Physician Assistants and Nurse Practitioners) who all work together to provide you with the care you need, when you need it.  Your next appointment:   3 month(s)  The format for your next appointment:   In Person  Provider:   Sinclair Grooms, MD  or Gwyndolyn Kaufman, MD or Larae Grooms, MD   Important Information About Sugar

## 2022-03-12 DIAGNOSIS — M722 Plantar fascial fibromatosis: Secondary | ICD-10-CM | POA: Insufficient documentation

## 2022-03-12 DIAGNOSIS — M25572 Pain in left ankle and joints of left foot: Secondary | ICD-10-CM | POA: Diagnosis not present

## 2022-03-12 DIAGNOSIS — M6289 Other specified disorders of muscle: Secondary | ICD-10-CM | POA: Insufficient documentation

## 2022-03-12 DIAGNOSIS — R29898 Other symptoms and signs involving the musculoskeletal system: Secondary | ICD-10-CM | POA: Diagnosis not present

## 2022-03-12 DIAGNOSIS — S93492A Sprain of other ligament of left ankle, initial encounter: Secondary | ICD-10-CM | POA: Diagnosis not present

## 2022-03-12 HISTORY — DX: Plantar fascial fibromatosis: M72.2

## 2022-03-12 HISTORY — DX: Other specified disorders of muscle: M62.89

## 2022-03-16 DIAGNOSIS — M25572 Pain in left ankle and joints of left foot: Secondary | ICD-10-CM | POA: Diagnosis not present

## 2022-03-16 DIAGNOSIS — M79672 Pain in left foot: Secondary | ICD-10-CM | POA: Diagnosis not present

## 2022-03-19 ENCOUNTER — Other Ambulatory Visit: Payer: Medicare Other

## 2022-03-23 ENCOUNTER — Ambulatory Visit (INDEPENDENT_AMBULATORY_CARE_PROVIDER_SITE_OTHER): Payer: Self-pay

## 2022-03-23 DIAGNOSIS — B351 Tinea unguium: Secondary | ICD-10-CM

## 2022-03-23 NOTE — Progress Notes (Signed)
Patient presents today for the 1st laser treatment. Diagnosed with mycotic nail infection by Dr. Posey Pronto.   Toenail most affected 1st left.  All other systems are negative.  Nails were filed thin. Laser therapy was administered to 1st toenails left and patient tolerated the treatment well. All safety precautions were in place.     Follow up in 4 weeks for laser # 3.

## 2022-03-31 DIAGNOSIS — M722 Plantar fascial fibromatosis: Secondary | ICD-10-CM | POA: Diagnosis not present

## 2022-04-01 ENCOUNTER — Ambulatory Visit (INDEPENDENT_AMBULATORY_CARE_PROVIDER_SITE_OTHER): Payer: Medicare Other | Admitting: Podiatry

## 2022-04-01 ENCOUNTER — Encounter: Payer: Self-pay | Admitting: Podiatry

## 2022-04-01 DIAGNOSIS — M722 Plantar fascial fibromatosis: Secondary | ICD-10-CM

## 2022-04-01 DIAGNOSIS — I25118 Atherosclerotic heart disease of native coronary artery with other forms of angina pectoris: Secondary | ICD-10-CM

## 2022-04-01 DIAGNOSIS — L600 Ingrowing nail: Secondary | ICD-10-CM

## 2022-04-01 NOTE — Patient Instructions (Signed)

## 2022-04-02 DIAGNOSIS — Z23 Encounter for immunization: Secondary | ICD-10-CM | POA: Diagnosis not present

## 2022-04-04 NOTE — Progress Notes (Signed)
Subjective:   Patient ID: [name] [name], female   DOB: 66 y.o.   MRN: [id number]   HPI Patient states that she is getting some laser which has been somewhat effective with her fungus but she developed a painful ingrown toenail on her left big toe and it is sore when pressed and making it hard to wear shoe gear comfortably.  Also has problems with heel pain   ROS      Objective:  Physical Exam  Neurovascular status intact with an incurvated medial border of the left hallux painful when pressed with irritation of the bed slight redness no active drainage noted.  Does still have some yellow discoloration also was noted to have heel pain with palpation     Assessment:  Ingrown toenail deformity left hallux medial border with pain along with inflammation of the plantar fascia painful when pressed     Plan:  Reviewed ingrown toenail recommended correction explained procedure risk patient wants surgery and today I went ahead and I allowed her to read consent form understanding surgery she signed the consent form I did sterile prep I anesthetized 60 mg like Marcaine mixture I then remove the medial border with sterile instrumentation exposed the matrix of the bed and applied phenol 3 applications 30 seconds followed by alcohol lavage sterile dressing gave instructions on soaks and to wear dressing 24 hours take it off earlier if throbbing were to occur.  Also discussed the Planter fasciitis I did discuss the consideration for treatment of this with different modalities we have available but at this point I instructed her on physical therapy shoe gear modifications

## 2022-04-07 ENCOUNTER — Ambulatory Visit: Payer: Medicare Other | Admitting: Podiatry

## 2022-04-07 DIAGNOSIS — M722 Plantar fascial fibromatosis: Secondary | ICD-10-CM | POA: Diagnosis not present

## 2022-04-14 DIAGNOSIS — M722 Plantar fascial fibromatosis: Secondary | ICD-10-CM | POA: Diagnosis not present

## 2022-04-16 ENCOUNTER — Other Ambulatory Visit: Payer: Medicare Other

## 2022-04-26 ENCOUNTER — Encounter: Payer: Self-pay | Admitting: Interventional Cardiology

## 2022-04-26 DIAGNOSIS — I10 Essential (primary) hypertension: Secondary | ICD-10-CM

## 2022-04-26 DIAGNOSIS — Z79899 Other long term (current) drug therapy: Secondary | ICD-10-CM

## 2022-04-27 DIAGNOSIS — M722 Plantar fascial fibromatosis: Secondary | ICD-10-CM | POA: Diagnosis not present

## 2022-04-28 MED ORDER — DAPAGLIFLOZIN PROPANEDIOL 10 MG PO TABS: 10.0000 mg | ORAL_TABLET | Freq: Every day | ORAL | 11 refills | Status: DC

## 2022-04-28 NOTE — Telephone Encounter (Signed)
Called and spoke with patient and discussed Dr. Thompson Caul recommendations.  Per Dr. Tamala Julian: Yes, stop Carvedilol and place in allergies as an intolerance causing SOB. Start [place] or [place] 10 mg daily. BMET in 2-3 weeks.    Med list updated. Samples of Farxiga 10mg  left at front desk for pick-up.  BMET ordered and lab appt made for 05/17/22.  Patient verbalized understanding of the above and expressed appreciation for call.

## 2022-05-04 ENCOUNTER — Encounter: Payer: Self-pay | Admitting: Interventional Cardiology

## 2022-05-06 DIAGNOSIS — R278 Other lack of coordination: Secondary | ICD-10-CM | POA: Diagnosis not present

## 2022-05-06 DIAGNOSIS — R351 Nocturia: Secondary | ICD-10-CM | POA: Diagnosis not present

## 2022-05-06 DIAGNOSIS — N3941 Urge incontinence: Secondary | ICD-10-CM | POA: Diagnosis not present

## 2022-05-06 MED ORDER — EMPAGLIFLOZIN 10 MG PO TABS: 10.0000 mg | ORAL_TABLET | Freq: Every day | ORAL | 11 refills | Status: DC

## 2022-05-11 ENCOUNTER — Encounter: Payer: Self-pay | Admitting: Interventional Cardiology

## 2022-05-12 ENCOUNTER — Encounter: Payer: Self-pay | Admitting: Podiatry

## 2022-05-12 ENCOUNTER — Ambulatory Visit (INDEPENDENT_AMBULATORY_CARE_PROVIDER_SITE_OTHER): Payer: Medicare Other | Admitting: Podiatry

## 2022-05-12 DIAGNOSIS — M722 Plantar fascial fibromatosis: Secondary | ICD-10-CM

## 2022-05-12 MED ORDER — TRIAMCINOLONE ACETONIDE 10 MG/ML IJ SUSP
10.0000 mg | Freq: Once | INTRAMUSCULAR | Status: AC
  Administered 2022-05-12: 10 mg

## 2022-05-13 NOTE — Progress Notes (Signed)
Subjective:   Patient ID: [name] [name], female   DOB: 66 y.o.   MRN: [id number]   HPI Patient states that I seem to be doing okay with the nail that was trimmed out and I just wanted to check the fungus.  Patient is also complaining of a lot of pain in the left heel and arch   ROS      Objective:  Physical Exam  Neuro vascular status intact with patient's found to have white area on the medial border of the left hallux which is remained very localized with no proximal spread noted currently.  I noted there to be a lot of discomfort in the left heel and arch along the fascial medial band of the plantar fascia     Assessment:  Moderate trauma with possible mild fungal infiltration left hallux nail bed localized with no proximal spread and history of laser therapy also.  Planter fasciitis left is not responded to previous physical therapy     Plan:  Reviewed condition do not recommend further treatment currently and if it were to expand may require more or if pain were to recur may require removal of ingrown toenail permanently.  Patient was educated on this will be seen back to recheck.  Reviewed the condition of fascial inflammation and I went ahead today I did sterile prep and did a mid arch fascial injection 3 mg Kenalog 5 mg Xylocaine and applied fascial brace that was fitted properly to her lower leg to hold the foot with heat and ice application.  Patient to be seen back as symptoms indicate

## 2022-05-17 ENCOUNTER — Other Ambulatory Visit: Payer: Medicare Other

## 2022-05-21 ENCOUNTER — Other Ambulatory Visit: Payer: Medicare Other

## 2022-05-24 ENCOUNTER — Ambulatory Visit: Payer: Medicare Other | Attending: Interventional Cardiology

## 2022-05-24 DIAGNOSIS — Z79899 Other long term (current) drug therapy: Secondary | ICD-10-CM | POA: Diagnosis not present

## 2022-05-24 DIAGNOSIS — I10 Essential (primary) hypertension: Secondary | ICD-10-CM | POA: Diagnosis not present

## 2022-05-25 ENCOUNTER — Encounter: Payer: Self-pay | Admitting: Emergency Medicine

## 2022-05-25 ENCOUNTER — Ambulatory Visit (INDEPENDENT_AMBULATORY_CARE_PROVIDER_SITE_OTHER): Payer: Medicare Other | Admitting: Emergency Medicine

## 2022-05-25 ENCOUNTER — Other Ambulatory Visit: Payer: Self-pay | Admitting: Emergency Medicine

## 2022-05-25 VITALS — BP 120/68 | HR 83 | Temp 98.5°F | Ht 65.5 in | Wt 172.6 lb

## 2022-05-25 DIAGNOSIS — I25118 Atherosclerotic heart disease of native coronary artery with other forms of angina pectoris: Secondary | ICD-10-CM

## 2022-05-25 DIAGNOSIS — J449 Chronic obstructive pulmonary disease, unspecified: Secondary | ICD-10-CM | POA: Diagnosis not present

## 2022-05-25 DIAGNOSIS — C3431 Malignant neoplasm of lower lobe, right bronchus or lung: Secondary | ICD-10-CM | POA: Diagnosis not present

## 2022-05-25 DIAGNOSIS — H52203 Unspecified astigmatism, bilateral: Secondary | ICD-10-CM | POA: Diagnosis not present

## 2022-05-25 DIAGNOSIS — H18603 Keratoconus, unspecified, bilateral: Secondary | ICD-10-CM | POA: Diagnosis not present

## 2022-05-25 DIAGNOSIS — Z961 Presence of intraocular lens: Secondary | ICD-10-CM | POA: Diagnosis not present

## 2022-05-25 LAB — BASIC METABOLIC PANEL
BUN/Creatinine Ratio: 22 (ref 12–28)
BUN: 15 mg/dL (ref 8–27)
CO2: 22 mmol/L (ref 20–29)
Calcium: 9.4 mg/dL (ref 8.7–10.3)
Chloride: 105 mmol/L (ref 96–106)
Creatinine, Ser: 0.67 mg/dL (ref 0.57–1.00)
Glucose: 99 mg/dL (ref 70–99)
Potassium: 4.3 mmol/L (ref 3.5–5.2)
Sodium: 142 mmol/L (ref 134–144)
eGFR: 95 mL/min/{1.73_m2} (ref >59)

## 2022-05-25 MED ORDER — VENTOLIN HFA 108 (90 BASE) MCG/ACT IN AERS: INHALATION_SPRAY | RESPIRATORY_TRACT | 9 refills | Status: DC

## 2022-05-25 NOTE — Patient Instructions (Signed)
Please try using your albuterol 2 puffs prior to exercise, significant exertion.  Take it 15 minutes before you exert yourself.  You could also use 2 puffs up to every 4 hours if you need it for episodes of shortness of breath, chest tightness, wheezing. We discussed possibly starting a scheduled inhaler medication today.  We will hold off for now while we wait to see how you respond to the intermittent albuterol Continue to follow with Dr. Julien Nordmann as planned Follow with Dr Lamonte Sakai in 6 months or sooner if you have any problems

## 2022-05-25 NOTE — Assessment & Plan Note (Signed)
Doing well on surveillance. Continue to follow with Dr. Julien Nordmann as planned

## 2022-05-25 NOTE — Progress Notes (Signed)
Subjective:    Patient ID: [name] [name], female    DOB: [birth year], 66 y.o.   MRN: [id number]  COPD She complains of cough and shortness of breath. There is no wheezing. Pertinent negatives include no ear pain, fever, headaches, postnasal drip, rhinorrhea, sneezing, sore throat or trouble swallowing. Her past medical history is significant for COPD.    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  Also with a history of CAD.  Currently managed on albuterol as needed.  She is been on Stiolto in the past but did not miss it when we stopped it. She did pulmonary rehab again, finished it in November. May have gotten some benefit, not as much as last time. No desaturations noted. She is going to the [facility] now for exercise and wt loss. Hasn't used her albuterol in a year.   Most recent CT chest 02/12/2021 reviewed by me showed no evidence of recurrent disease.  Stable left axillary lymph node, not pathologically enlarged.  Planning for repeat in 1 year  ROV 05/25/22 --[name] [name] is 66 with history of right lower lobe lobectomy for adenocarcinoma with neoadjuvant chemo, COPD, CAD, tension with diastolic in systolic CHF (EF 30-09%).  As of our last visit 1 year ago she was no longer on scheduled maintenance BD therapy (previously on Stiolto).  She had an acute flare in July thought due to either exposures and allergies versus URI.  She was seen in office at that time, received pred.  Today she reports that she has been following with cardiology, was recently started on carvedilol, is on Entresto.  Her activity level has been curtailed by plantar fasciitis. She believes the b-blocker may have worsened her breathing - based on this the carvedilol was stopped and she went [place]. A repeat echo is pending.  Her breathing did improve some off the carvedilol. She is still having some exertional  SOB and fatigue. She has used some albuterol occasionally.   CT chest 02/16/2022 reviewed by me showed unchanged postoperative findings without any evidence of recurrent or metastatic disease.   Review of Systems  Constitutional:  Negative for fever and unexpected weight change.  HENT:  Negative for congestion, dental problem, ear pain, nosebleeds, postnasal drip, rhinorrhea, sinus pressure, sneezing, sore throat and trouble swallowing.   Eyes:  Negative for redness and itching.  Respiratory:  Positive for cough and shortness of breath. Negative for chest tightness and wheezing.   Cardiovascular:  Negative for palpitations and leg swelling.  Gastrointestinal:  Negative for nausea and vomiting.  Genitourinary:  Negative for dysuria.  Musculoskeletal:  Negative for joint swelling.  Skin:  Negative for rash.  Neurological:  Negative for headaches.  Hematological:  Does not bruise/bleed easily.  Psychiatric/Behavioral:  Negative for dysphoric mood. The patient is not nervous/anxious.        Objective:   Physical Exam Vitals:   05/25/22 1131  BP: 120/68  Pulse: 83  Temp: 98.5 F (36.9 C)  TempSrc: Oral  SpO2: 96%  Weight: 172 lb 9.6 oz (78.3 kg)  Height: 5' 5.5" (1.664 m)   Gen: Pleasant, well-nourished, in no distress,  normal affect  ENT: No lesions,  mouth clear,  oropharynx clear, no postnasal drip, no stridor  Lungs: No use of accessory muscles, no crackles, no active wheezing  Cardiovascular: RRR, heart sounds normal, no murmur or gallops, no peripheral edema  Musculoskeletal: No deformities, no cyanosis  or clubbing  Neuro: alert, non focal  Skin: Warm, no lesions or rashes      Assessment & Plan:  COPD GOLD II B  She not really using albuterol with any frequency.  She does have exertional shortness of breath.  Suspect that there is a component of her COPD here.  We could retry scheduled bronchodilator therapy.  She would prefer to try strategically using her  albuterol before exercise.  We discussed strategies for this.  If she is significantly benefiting then may be reasonable to get her back on Stiolto.  We can discuss next time.  Please try using your albuterol 2 puffs prior to exercise, significant exertion.  Take it 15 minutes before you exert yourself.  You could also use 2 puffs up to every 4 hours if you need it for episodes of shortness of breath, chest tightness, wheezing. We discussed possibly starting a scheduled inhaler medication today.  We will hold off for now while we wait to see how you respond to the intermittent albuterol Follow with Dr Lamonte Sakai in 6 months or sooner if you have any problems  Primary cancer of right lower lobe of lung Doing well on surveillance. Continue to follow with Dr. Julien Nordmann as planned  Baltazar Apo, MD, PhD 05/25/2022, 11:58 AM [facility] [phone] or if no answer [phone]

## 2022-05-25 NOTE — Assessment & Plan Note (Signed)
She not really using albuterol with any frequency.  She does have exertional shortness of breath.  Suspect that there is a component of her COPD here.  We could retry scheduled bronchodilator therapy.  She would prefer to try strategically using her albuterol before exercise.  We discussed strategies for this.  If she is significantly benefiting then may be reasonable to get her back on Stiolto.  We can discuss next time.  Please try using your albuterol 2 puffs prior to exercise, significant exertion.  Take it 15 minutes before you exert yourself.  You could also use 2 puffs up to every 4 hours if you need it for episodes of shortness of breath, chest tightness, wheezing. We discussed possibly starting a scheduled inhaler medication today.  We will hold off for now while we wait to see how you respond to the intermittent albuterol Follow with Dr Lamonte Sakai in 6 months or sooner if you have any problems

## 2022-05-27 DIAGNOSIS — M722 Plantar fascial fibromatosis: Secondary | ICD-10-CM | POA: Diagnosis not present

## 2022-05-28 DIAGNOSIS — N3941 Urge incontinence: Secondary | ICD-10-CM | POA: Diagnosis not present

## 2022-05-28 DIAGNOSIS — M6289 Other specified disorders of muscle: Secondary | ICD-10-CM | POA: Diagnosis not present

## 2022-05-28 DIAGNOSIS — M62838 Other muscle spasm: Secondary | ICD-10-CM | POA: Diagnosis not present

## 2022-05-28 DIAGNOSIS — R351 Nocturia: Secondary | ICD-10-CM | POA: Diagnosis not present

## 2022-05-28 DIAGNOSIS — M6281 Muscle weakness (generalized): Secondary | ICD-10-CM | POA: Diagnosis not present

## 2022-06-10 DIAGNOSIS — M722 Plantar fascial fibromatosis: Secondary | ICD-10-CM | POA: Diagnosis not present

## 2022-06-17 DIAGNOSIS — M722 Plantar fascial fibromatosis: Secondary | ICD-10-CM | POA: Diagnosis not present

## 2022-06-23 ENCOUNTER — Ambulatory Visit (HOSPITAL_COMMUNITY): Payer: Medicare Other | Attending: Interventional Cardiology

## 2022-06-23 ENCOUNTER — Ambulatory Visit: Payer: Medicare Other | Admitting: Podiatry

## 2022-06-23 ENCOUNTER — Other Ambulatory Visit (HOSPITAL_COMMUNITY): Payer: Medicare Other

## 2022-06-23 DIAGNOSIS — I5042 Chronic combined systolic (congestive) and diastolic (congestive) heart failure: Secondary | ICD-10-CM | POA: Diagnosis not present

## 2022-06-23 DIAGNOSIS — R0602 Shortness of breath: Secondary | ICD-10-CM | POA: Insufficient documentation

## 2022-06-23 DIAGNOSIS — M722 Plantar fascial fibromatosis: Secondary | ICD-10-CM | POA: Diagnosis not present

## 2022-06-23 LAB — ECHOCARDIOGRAM COMPLETE
Area-P 1/2: 6.07 cm2
MV M vel: 4.71 m/s
MV Peak grad: 88.7 mmHg
Radius: 0.55 cm
S' Lateral: 3.8 cm

## 2022-06-30 DIAGNOSIS — M722 Plantar fascial fibromatosis: Secondary | ICD-10-CM | POA: Diagnosis not present

## 2022-07-06 DIAGNOSIS — R29898 Other symptoms and signs involving the musculoskeletal system: Secondary | ICD-10-CM | POA: Diagnosis not present

## 2022-07-06 DIAGNOSIS — M722 Plantar fascial fibromatosis: Secondary | ICD-10-CM | POA: Diagnosis not present

## 2022-07-06 DIAGNOSIS — S93492A Sprain of other ligament of left ankle, initial encounter: Secondary | ICD-10-CM | POA: Diagnosis not present

## 2022-07-06 NOTE — Progress Notes (Unsigned)
Cardiology Office Note   Date:  07/07/2022   ID:  [name] [name], [name] [name] 25-Oct-1955, MRN [id number]  PCP:  [name], MD    No chief complaint on file.  Coronary artery calcification  Wt Readings from Last 3 Encounters:  07/07/22 174 lb 3.2 oz (79 kg)  05/25/22 172 lb 9.6 oz (78.3 kg)  03/08/22 174 lb 3.2 oz (79 kg)       History of Present Illness: [name] [name] is a 67 y.o. female who was a patient of Dr. Daneen Schick.  Prior records from October 2023 show: " with a hx of asymptomatic CAD denoted by coronary calcification with mild obstructive RCA disease by Cor CTA 2020, hyperlipidemia, hypertension, Lung CA , chronic combined systolic and diastolic HF(EF 98-33% 8250) , and prior smoking history   She has pulmonary disease, dyspnea on exertion, and reluctance to use the diuretics because she already has a nocturia issue.  EF is reduced.  We will try and institute a guideline directed regimen for preservation of LV function.  Low-dose Delene Loll has been started.  Because she complains of shortness of breath my thought was to add an SGLT2 although she objects to this.  Today we have decided to add carvedilol 3.125 mg p.o. twice daily.   He is not able to exercise because of plantar fasciitis.  Because she is unable to exercise her weight has gradually increased."   LVEF 40 to 45% in May 2022 in January 2024 by echocardiogram. 2020 CT FFR: "CT FFR analysis shows mid and distal RCA =.74 to .76. suggesting moderate stenosis in RCA noted on CTA is significant and consistent with ischemia."  Ultimately, she did try Jardiance for 2 months but did not increased urination.   Denies : Chest pain. Dizziness. Leg edema. Nitroglycerin use. Orthopnea. Palpitations. Paroxysmal nocturnal dyspnea. Shortness of breath. Syncope.    Has not been exercising a lot since COVID.  Joint pains/ortho issues limited her.    Past Medical History:  Diagnosis Date   Anxiety     husband also cancer pt   Cancer ([place])    lung   Complication of anesthesia    bronchospasms post-op (pulmonary did consult but pt was discharged later that day)  With last surg. she was given albuterol inhaler & had steroid with surg.     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, transient, takes Verapimil for migraine & BP control    PONV (postoperative nausea and vomiting)    PVC (premature ventricular contraction)    Shortness of breath dyspnea    with exertion   Thyroid nodule    benign by biopsy   Varicose veins    left leg   Visual disturbance    Episodic   Vitamin D deficiency    Wears glasses     Past Surgical History:  Procedure Laterality Date   CARPAL TUNNEL RELEASE Left    CESAREAN SECTION     COLONOSCOPY W/ BIOPSIES AND POLYPECTOMY     DILATION AND CURETTAGE OF UTERUS  TONSILLECTOMY     VARICOSE VEIN SURGERY     left   VIDEO ASSISTED THORACOSCOPY (VATS)/ LOBECTOMY Right 07/23/2014   Procedure: VIDEO ASSISTED THORACOSCOPY (VATS)/ LOBECTOMY;  Surgeon: Ivin Poot, MD;  Location: [facility];  Service: Thoracic;  Laterality: Right;   VIDEO BRONCHOSCOPY N/A 07/23/2014   Procedure: VIDEO BRONCHOSCOPY;  Surgeon: Ivin Poot, MD;  Location: [facility];  Service: Thoracic;  Laterality: N/A;   VIDEO BRONCHOSCOPY WITH ENDOBRONCHIAL ULTRASOUND Right 04/17/2014   Procedure: VIDEO BRONCHOSCOPY WITH ENDOBRONCHIAL ULTRASOUND;  Surgeon: Collene Gobble, MD;  Location: [facility];  Service: Thoracic;  Laterality: Right;   VIDEO BRONCHOSCOPY WITH ENDOBRONCHIAL ULTRASOUND N/A 07/08/2014   Procedure: VIDEO BRONCHOSCOPY WITH ENDOBRONCHIAL  ULTRASOUND;  Surgeon: Ivin Poot, MD;  Location: [facility];  Service: Thoracic;  Laterality: N/A;     Current Outpatient Medications  Medication Sig Dispense Refill   aspirin EC 81 MG tablet Take 1 tablet (81 mg total) by mouth daily. Swallow whole. 30 tablet 12   empagliflozin (JARDIANCE) 10 MG TABS tablet Take 1 tablet (10 mg total) by mouth daily before breakfast. 30 tablet 11   loratadine (CLARITIN) 10 MG tablet Take 10 mg by mouth daily as needed.     rosuvastatin (CRESTOR) 10 MG tablet TAKE ONE TABLET BY MOUTH DAILY 90 tablet 2   sacubitril-valsartan (ENTRESTO) 24-26 MG Take 1 tablet by mouth 2 (two) times daily. 60 tablet 11   Suvorexant (BELSOMRA) 10 MG TABS Take 10 mg by mouth at bedtime as needed. 30 tablet 2   VENTOLIN HFA 108 (90 Base) MCG/ACT inhaler INHALE 1 PUFF BY MOUTH THREE TIMES DAILY AS NEEDED FOR WHEEZING FOR SHORTNESS OF BREATH 18 g 0   Vitamin D, Ergocalciferol, (DRISDOL) 50000 units CAPS capsule Take 50,000 Units by mouth every 7 (seven) days.     nitroGLYCERIN (NITROSTAT) 0.4 MG SL tablet Place 1 tablet (0.4 mg total) under the tongue every 5 (five) minutes as needed for chest pain. 25 tablet 6   Current Facility-Administered Medications  Medication Dose Route Frequency Provider Last Rate Last Admin   Ampicillin-Sulbactam (UNASYN) 3 g in sodium chloride 0.9 % 100 mL IVPB  3 g Intravenous Once Avelina Laine, PA-C        Allergies:   Advil [ibuprofen], Amoxicillin-pot clavulanate, Carvedilol, Codeine, Hydrocodone-guaifenesin, Sulfa antibiotics, Temazepam, Plasticized base [plastibase], and Zofran [ondansetron hcl]    Social History:  The patient  reports that she quit smoking about 9 years ago. Her smoking use included cigarettes. She has a 20.00 pack-year smoking history. She has never used smokeless tobacco. She reports current alcohol use. She reports that she does not use drugs.   Family History:  The patient's family history includes Atrial fibrillation in her  father; Cancer in her mother; Diverticulitis in her brother and father; Headache in her sister; Hypertension in her brother, father, sister, and sister.    ROS:  Please see the history of present illness.   Otherwise, review of systems are positive for foot pain- plantar fasciitis.   All other systems are reviewed and negative.    PHYSICAL EXAM: VS:  BP (!) 156/76   Pulse 100   Ht 5' 5.5" (1.664 m)   Wt 174 lb 3.2 oz (79 kg)   SpO2 96%   BMI 28.55 kg/m  , BMI Body mass index is 28.55 kg/m. GEN: Well nourished, well developed, in no acute distress HEENT: normal Neck: no JVD, carotid bruits, or masses Cardiac: RRR; no murmurs, rubs, or  gallops,no edema  Respiratory:  clear to auscultation bilaterally, normal work of breathing GI: soft, nontender, nondistended, + BS MS: no deformity or atrophy Skin: warm and dry, no rash Neuro:  Strength and sensation are intact Psych: euthymic mood, full affect     Recent Labs: 02/15/2022: ALT 11; Hemoglobin 14.5; Platelet Count 270 05/24/2022: BUN 15; Creatinine, Ser 0.67; Potassium 4.3; Sodium 142   Lipid Panel    Component Value Date/Time   CHOL 127 11/14/2020 1029   TRIG 46 11/14/2020 1029   HDL 68 11/14/2020 1029   CHOLHDL 1.9 11/14/2020 1029   LDLCALC 48 11/14/2020 1029     Other studies Reviewed: Additional studies/ records that were reviewed today with results demonstrating: LVEF 40 to 45% in May 2022 in January 2024 by echocardiogram.   ASSESSMENT AND PLAN:  Coronary artery calcification: Continue statin therapy.  No clear angina.  We discussed the rationale of medical therapy for her RCA disease given her lack of symptoms.  She has been following this treatment plan for several years with Dr. Tamala Julian.  We will continue medical therapy unless she has any increasing symptoms or decrease in exercise tolerance.  She is hoping to get back in more exercise in the upcoming months. Dyspnea on exertion: Chronic.  Likely multifactorial  including COPD, prior partial pneumonectomy, diastolic heart failure and deconditioning. Chronic systolic heart failure: LVEF 40 to 45%.  Carvedilol was added in October 2023.  SGLT2 was discussed but she declined due to concerns over increased urination.  Ultimately, she did try Jardiance for 2 months but did not increased urination.  Did not tolerate coreg due to resp issues.  Hypertension: Has not been checking recently.  Did gain weight so BP may have increased from that.  Increase Entresto to 49/51 BID.   Former smoker:  PVCs: asymptomatic currently   Current medicines are reviewed at length with the patient today.  The patient concerns regarding her medicines were addressed.  The following changes have been made:  No change  Labs/ tests ordered today include:  No orders of the defined types were placed in this encounter.   Recommend 150 minutes/week of aerobic exercise Low fat, low carb, high fiber diet recommended  Disposition:   FU in 6 months   Signed, Larae Grooms, MD  07/07/2022 4:43 PM    [facility] [place], [address], [place]  [address] Phone: [phone]; Fax: [phone]

## 2022-07-07 ENCOUNTER — Ambulatory Visit: Payer: Medicare Other | Attending: Interventional Cardiology | Admitting: Interventional Cardiology

## 2022-07-07 ENCOUNTER — Encounter: Payer: Self-pay | Admitting: Interventional Cardiology

## 2022-07-07 VITALS — BP 156/76 | HR 100 | Ht 65.5 in | Wt 174.2 lb

## 2022-07-07 DIAGNOSIS — I493 Ventricular premature depolarization: Secondary | ICD-10-CM | POA: Insufficient documentation

## 2022-07-07 DIAGNOSIS — I5042 Chronic combined systolic (congestive) and diastolic (congestive) heart failure: Secondary | ICD-10-CM | POA: Diagnosis not present

## 2022-07-07 DIAGNOSIS — I2584 Coronary atherosclerosis due to calcified coronary lesion: Secondary | ICD-10-CM | POA: Diagnosis not present

## 2022-07-07 DIAGNOSIS — I10 Essential (primary) hypertension: Secondary | ICD-10-CM | POA: Diagnosis not present

## 2022-07-07 DIAGNOSIS — I2510 Atherosclerotic heart disease of native coronary artery without angina pectoris: Secondary | ICD-10-CM | POA: Diagnosis not present

## 2022-07-07 DIAGNOSIS — Z87891 Personal history of nicotine dependence: Secondary | ICD-10-CM

## 2022-07-07 DIAGNOSIS — E785 Hyperlipidemia, unspecified: Secondary | ICD-10-CM | POA: Insufficient documentation

## 2022-07-07 MED ORDER — NITROGLYCERIN 0.4 MG SL SUBL: 0.4000 mg | SUBLINGUAL_TABLET | SUBLINGUAL | 6 refills | Status: DC | PRN

## 2022-07-07 MED ORDER — ENTRESTO 49-51 MG PO TABS: 1.0000 | ORAL_TABLET | Freq: Two times a day (BID) | ORAL | 6 refills | Status: DC

## 2022-07-07 MED ORDER — ROSUVASTATIN CALCIUM 10 MG PO TABS: 10.0000 mg | ORAL_TABLET | Freq: Every day | ORAL | 3 refills | Status: DC

## 2022-07-07 NOTE — Patient Instructions (Signed)
Medication Instructions:  Your physician has recommended you make the following change in your medication: Increase Entresto to 49/51 mg by mouth twice daily  *If you need a refill on your cardiac medications before your next appointment, please call your pharmacy*   Lab Work: Your physician recommends that you return for lab work on 07/23/22--BMP.  This is not fasting.  The lab is open from 7:15 AM to 4:30 PM  If you have labs (blood work) drawn today and your tests are completely normal, you will receive your results only by: [place] (if you have MyChart) OR A paper copy in the mail If you have any lab test that is abnormal or we need to change your treatment, we will call you to review the results.   Testing/Procedures: none   Follow-Up: At [facility], you and your health needs are our priority.  As part of our continuing mission to provide you with exceptional heart care, we have created designated Provider Care Teams.  These Care Teams include your primary Cardiologist (physician) and Advanced Practice Providers (APPs -  Physician Assistants and Nurse Practitioners) who all work together to provide you with the care you need, when you need it.  We recommend signing up for the patient portal called "MyChart".  Sign up information is provided on this After Visit Summary.  MyChart is used to connect with patients for Virtual Visits (Telemedicine).  Patients are able to view lab/test results, encounter notes, upcoming appointments, etc.  Non-urgent messages can be sent to your provider as well.   To learn more about what you can do with MyChart, go to [email or web address].    Your next appointment:   January 21, 2023 at 4:20  Provider:   Larae Grooms, MD     Other Instructions

## 2022-07-08 ENCOUNTER — Other Ambulatory Visit (HOSPITAL_COMMUNITY): Payer: Medicare Other

## 2022-07-08 DIAGNOSIS — M722 Plantar fascial fibromatosis: Secondary | ICD-10-CM | POA: Diagnosis not present

## 2022-07-15 ENCOUNTER — Encounter: Payer: Self-pay | Admitting: Psychology

## 2022-07-15 DIAGNOSIS — I10 Essential (primary) hypertension: Secondary | ICD-10-CM | POA: Insufficient documentation

## 2022-07-15 DIAGNOSIS — F411 Generalized anxiety disorder: Secondary | ICD-10-CM | POA: Insufficient documentation

## 2022-07-15 DIAGNOSIS — E559 Vitamin D deficiency, unspecified: Secondary | ICD-10-CM | POA: Insufficient documentation

## 2022-07-15 DIAGNOSIS — I2510 Atherosclerotic heart disease of native coronary artery without angina pectoris: Secondary | ICD-10-CM | POA: Insufficient documentation

## 2022-07-16 ENCOUNTER — Ambulatory Visit: Payer: Medicare Other | Admitting: Psychology

## 2022-07-16 ENCOUNTER — Encounter: Payer: Self-pay | Admitting: Psychology

## 2022-07-16 ENCOUNTER — Ambulatory Visit (INDEPENDENT_AMBULATORY_CARE_PROVIDER_SITE_OTHER): Payer: Medicare Other | Admitting: Psychology

## 2022-07-16 DIAGNOSIS — R4189 Other symptoms and signs involving cognitive functions and awareness: Secondary | ICD-10-CM

## 2022-07-16 DIAGNOSIS — G4733 Obstructive sleep apnea (adult) (pediatric): Secondary | ICD-10-CM | POA: Diagnosis not present

## 2022-07-16 HISTORY — DX: Other symptoms and signs involving cognitive functions and awareness: R41.89

## 2022-07-16 NOTE — Progress Notes (Signed)
   Psychometrician Note   Cognitive testing was administered to [name] [name] by Milana Kidney, B.S. (psychometrist) under the supervision of Dr. Christia Reading, Ph.D., licensed psychologist on 07/16/2022. Ms. Ailes did not appear overtly distressed by the testing session per behavioral observation or responses across self-report questionnaires. Rest breaks were offered.    The battery of tests administered was selected by Dr. Christia Reading, Ph.D. with consideration to Ms. Hutsell's current level of functioning, the nature of her symptoms, emotional and behavioral responses during interview, level of literacy, observed level of motivation/effort, and the nature of the referral question. This battery was communicated to the psychometrist. Communication between Dr. Christia Reading, Ph.D. and the psychometrist was ongoing throughout the evaluation and Dr. Christia Reading, Ph.D. was immediately accessible at all times. Dr. Christia Reading, Ph.D. provided supervision to the psychometrist on the date of this [name] to the extent necessary to assure the quality of all services provided.    [name] [name] will return within approximately 1-2 weeks for an interactive feedback session with Dr. Melvyn Novas at which time her test performances, clinical impressions, and treatment recommendations will be reviewed in detail. Ms. Christon understands she can contact our office should she require our assistance before this time.  A total of 150 minutes of billable time were spent face-to-face with Ms. Vonbargen by the psychometrist. This includes both test administration and scoring time. Billing for these services is reflected in the clinical report generated by Dr. Christia Reading, Ph.D.  This note reflects time spent with the psychometrician and does not include test scores or any clinical interpretations made by Dr. Melvyn Novas. The full report will follow in a separate note.

## 2022-07-16 NOTE — Progress Notes (Unsigned)
NEUROPSYCHOLOGICAL EVALUATION [place]. [facility] Department of Neurology  Date of Evaluation: July 16, 2022  Reason for Referral:   [name] [name] is a 67 y.o. mixed-handed Caucasian female referred by  Larey Seat, M.D. , to characterize her current cognitive functioning and assist with diagnostic clarity and treatment planning in the context of subjective cognitive decline.   Assessment and Plan:   Clinical Impression(s): [name] [name] pattern of performance is suggestive of neuropsychological functioning within normal limits relative to age-matched peers. No cognitive domains exhibited impairment. Performances were appropriate across all assessed domains. This includes processing speed, attention/concentration, executive functioning, safety/judgment, receptive and expressive language, visuospatial abilities, and all aspects of learning and memory. [name] [name] denied difficulties completing instrumental activities of daily living (ADLs) independently. She does not meet formal diagnostic criteria for a neurocognitive disorder at the present time.   Subjective cognitive dysfunction may simply represent age-related and age-appropriate changes based upon current testing. Dysfunction could also be influenced by sleep disturbances (she scored in the moderate range across a related questionnaire), various medical ailments (e.g., cardiovascular conditions, elevated glucose levels), and somatic preoccupation surrounding various physical ailments.   Memory performances were excellent and a relative strength across an already strong testing profile. More specifically, [name] [name] was able to learn novel verbal and visual information efficiently and retain this knowledge after lengthy delays. Overall, memory performance combined with intact performances across other areas of cognitive functioning is not suggestive of Alzheimer's disease. Likewise, her cognitive and  behavioral profile is not suggestive of any other form of neurodegenerative illness presently.  Recommendations: Should [name] [name] express concern surrounding progressive cognitive and/or functional decline in the future, a repeat evaluation could be considered at that time. The current evaluation will serve as an excellent baseline moving forward.   [name] [name] is encouraged to attend to lifestyle factors for brain health (e.g., regular physical exercise, good nutrition habits, regular participation in cognitively-stimulating activities, and general stress management techniques), which are likely to have benefits for both emotional adjustment and cognition. In fact, in addition to promoting good general health, regular exercise incorporating aerobic activities (e.g., brisk walking, jogging, cycling, etc.) has been demonstrated to be a very effective treatment for depression and stress, with similar efficacy rates to both antidepressant medication and psychotherapy. Optimal control of vascular risk factors (including safe cardiovascular exercise and adherence to dietary recommendations) is encouraged. Regular use of a CPAP machine could be considered given very mild sleep apnea. Continued participation in activities which provide mental stimulation and social interaction is also recommended.   If interested, there are some activities which have therapeutic value and can be useful in keeping her cognitively stimulated. For suggestions, [name] [name] is encouraged to go to the following website: https://www.[facility].org/get-to-know-barrow/centers-programs/neurorehabilitation-center/neuro-rehab-apps-and-games/ which has options, categorized by level of difficulty. It should be noted that these activities should not be viewed as a substitute for therapy.  Memory can be improved using internal strategies such as rehearsal, repetition, chunking, mnemonics, association, and imagery. External strategies such as  written notes in a consistently used memory journal, visual and nonverbal auditory cues such as a calendar on the refrigerator or appointments with alarm, such as on a cell phone, can also help maximize recall.    To address problems with fluctuating attention, she may wish to consider:   -Avoiding external distractions when needing to concentrate   -Limiting exposure to fast paced environments with multiple sensory demands   -Writing down complicated information and using checklists   -  Attempting and completing one task at a time (i.e., no multi-tasking)   -Verbalizing aloud each step of a task to maintain focus   -Taking frequent breaks during the completion of steps/tasks to avoid fatigue   -Reducing the amount of information considered at one time  Review of Records:   [name] [name] was seen by [facility] Larey Seat, M.D.) on 10/08/2021 for follow-up of sleep and memory concerns. Regarding the former, a recent sleep study suggested very mild sleep apnea to the extent that the use of CPAP intervention was voluntary. Several bathroom breaks were noted disrupting overall sleep attainment. Regarding cognition, [name] [name] described word finding difficulties and generalized forgetfulness. ADLs were intact. Performance on a brief cognitive screening instrument (MOCA) was 23/30. Ultimately, [name] [name] was referred for a comprehensive neuropsychological evaluation to characterize her cognitive abilities and to assist with diagnostic clarity and treatment planning.   Brain MRI on 07/11/2021 was unremarkable.  Past Medical History:  Diagnosis Date   Arthritis of hand 12/04/2021   CAD (coronary artery disease)    Central scotoma, bilateral 40/98/1191   Complication of anesthesia    bronchospasms post-op (pulmonary did consult but pt was discharged later that day)  With last surg. she was given albuterol inhaler & had steroid with surg.     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, transient, takes Verapimil for migraine & BP control    Mild obstructive sleep apnea 07/28/2021   Pain of right thumb 06/24/2017   Plantar fasciitis of left foot 03/12/2022   PONV (postoperative nausea and vomiting)    Primary cancer of right lower lobe of lung 04/17/2014   Shortness of breath 09/17/2019   Sprain of lateral ligament of ankle joint 09/11/2021   Subjective memory complaints 07/16/2022   Tenosynovitis of both hands 12/04/2021   Thyroid nodule    benign by biopsy   Tightness of left gastrocnemius muscle 03/12/2022   Transient visual loss 01/25/2012   Varicose veins    left leg   Vitamin D deficiency    Wears glasses     Past Surgical History:  Procedure Laterality Date   CARPAL TUNNEL RELEASE Left    CESAREAN SECTION     COLONOSCOPY W/ BIOPSIES AND POLYPECTOMY     DILATION AND CURETTAGE OF UTERUS     TONSILLECTOMY     VARICOSE VEIN SURGERY     left   VIDEO ASSISTED THORACOSCOPY (VATS)/ LOBECTOMY Right 07/23/2014   Procedure: VIDEO ASSISTED THORACOSCOPY (VATS)/ LOBECTOMY;  Surgeon: Ivin Poot, MD;  Location: [place];  Service: Thoracic;  Laterality: Right;   VIDEO BRONCHOSCOPY N/A 07/23/2014   Procedure: VIDEO BRONCHOSCOPY;  Surgeon: Ivin Poot, MD;  Location: [place];  Service: Thoracic;  Laterality: N/A;   VIDEO BRONCHOSCOPY WITH ENDOBRONCHIAL ULTRASOUND Right 04/17/2014   Procedure: VIDEO BRONCHOSCOPY WITH ENDOBRONCHIAL ULTRASOUND;  Surgeon: Collene Gobble, MD;  Location: [place];  Service: Thoracic;  Laterality: Right;   VIDEO  BRONCHOSCOPY WITH ENDOBRONCHIAL ULTRASOUND N/A 07/08/2014   Procedure: VIDEO BRONCHOSCOPY WITH  ENDOBRONCHIAL ULTRASOUND;  Surgeon: Ivin Poot, MD;  Location: [facility] OR;  Service: Thoracic;  Laterality: N/A;    Current Outpatient Medications:    aspirin EC 81 MG tablet, Take 1 tablet (81 mg total) by mouth daily. Swallow whole., Disp: 30 tablet, Rfl: 12   empagliflozin (JARDIANCE) 10 MG TABS tablet, Take 1 tablet (10 mg total) by mouth daily before breakfast., Disp: 30 tablet, Rfl: 11   loratadine (CLARITIN) 10 MG tablet, Take 10 mg by mouth daily as needed., Disp: , Rfl:    nitroGLYCERIN (NITROSTAT) 0.4 MG SL tablet, Place 1 tablet (0.4 mg total) under the tongue every 5 (five) minutes as needed for chest pain., Disp: 25 tablet, Rfl: 6   rosuvastatin (CRESTOR) 10 MG tablet, Take 1 tablet (10 mg total) by mouth daily., Disp: 90 tablet, Rfl: 3   sacubitril-valsartan (ENTRESTO) 49-51 MG, Take 1 tablet by mouth 2 (two) times daily., Disp: 60 tablet, Rfl: 6   Suvorexant (BELSOMRA) 10 MG TABS, Take 10 mg by mouth at bedtime as needed., Disp: 30 tablet, Rfl: 2   VENTOLIN HFA 108 (90 Base) MCG/ACT inhaler, INHALE 1 PUFF BY MOUTH THREE TIMES DAILY AS NEEDED FOR WHEEZING FOR SHORTNESS OF BREATH, Disp: 18 g, Rfl: 0   Vitamin D, Ergocalciferol, (DRISDOL) 50000 units CAPS capsule, Take 50,000 Units by mouth every 7 (seven) days., Disp: , Rfl:   Current Facility-Administered Medications:    Ampicillin-Sulbactam (UNASYN) 3 g in sodium chloride 0.9 % 100 mL IVPB, 3 g, Intravenous, Once, Avelina Laine, PA-C  Clinical Interview:   The following information was obtained during a clinical interview with Ms. Bamber prior to cognitive testing.  Cognitive Symptoms: Decreased short-term memory: Endorsed. She described fairly generalized concerns surrounding trouble with information retrieval. Michela Pitcher information was generally said to come with time. She described trouble finding her car in larger parking lots at times,  as well as a few isolated incidents where she has forgotten how to turn on her windshield wipers. Decreased long-term memory: Denied. Decreased attention/concentration: Denied. Reduced processing speed: Endorsed. She described feeling mentally foggy at times, generally presenting as greater trouble understanding what individuals may be saying to her. Difficulties with executive functions: Endorsed. She noted some trouble with multi-tasking, as well as more modest trouble with organization. She denied trouble with impulsivity or any significant personality changes.  Difficulties with emotion regulation: Denied. Difficulties with receptive language: Denied. Difficulties with word finding: Endorsed. Decreased visuoperceptual ability: Denied.  Trajectory of deficits: She has a positive cancer history and underwent six rounds of chemotherapy and 28 radiation sessions about 9 years prior. She noted that radiation sessions were cut short due to concern for over-radiation. Mild cognitive inefficiencies were said to first be noticed around the time of active treatment. They have more or less persisted to present day. However, she did add that they seem to have become more noticeable during the past several years.   Difficulties completing ADLs: Denied.  Additional Medical History: History of traumatic brain injury/concussion: Denied. History of stroke: Denied. History of seizure activity: Denied. History of known exposure to toxins: Denied. Symptoms of chronic pain: Endorsed. She reported ongoing symptoms of plantar fasciitis, as well as diffuse arthritic pain with particular areas of emphasis surrounding her neck, back, and thumbs.  Experience of frequent headaches/migraines: Denied. She reported a more remote history of atypical migraines with visual abnormalities rather than pronounced pain.   Sensory changes: She wears glasses with benefit. Other sensory changes/difficulties (e.g., hearing, taste,  smell) were  denied.  Balance/coordination difficulties: Endorsed. She reported a very long history of poor balance, noting that current abilities are stable relative to her baseline. She denied any recent falls.  Other motor difficulties: Denied.  Sleep History: Estimated hours obtained each night: Variable. Difficulties falling asleep: Endorsed. She described nights where she feels "very restless" and might finally get to sleep around the time she is supposed to wake that day. Difficulties staying asleep: Endorsed. She reported waking to use the restroom several times during the night.  Feels rested and refreshed upon awakening: Denied.  History of snoring: Endorsed. History of waking up gasping for air: Endorsed. Witnessed breath cessation while asleep: Endorsed. She described a history of very mild sleep study. She stated that her physician informed her that CPAP treatment was voluntary given mild symptoms. Medical records do confirm this. As such, she is not currently utilizing a CPAP device.   History of vivid dreaming: Denied. Excessive movement while asleep: Denied. Instances of acting out her dreams: Denied.  Psychiatric/Behavioral Health History: Depression: She denied prior or current mental health concerns or diagnoses. She did describe some situational depression/bereavement following the passing of her husband about 6 years prior. She described the primary symptom impacting her mood and motivation levels surrounding fatigue rather than depression. She also acknowledged that she has seemed a bit more irritable and short-fused lately. Current or remote suicidal ideation, intent, or plan was denied.  Anxiety: Denied. Mania: Denied. Trauma History: Denied. Visual/auditory hallucinations: Denied. Delusional thoughts: Denied.  Tobacco: Denied. Alcohol: She denied current alcohol consumption as well as a history of problematic alcohol abuse or dependence.  Recreational drugs:  Denied.  Family History: Problem Relation Age of Onset   Cancer Mother        cancer of the small intestines   Hypertension Father    Atrial fibrillation Father    Diverticulitis Father    Hypertension Sister    Hypertension Sister    Headache Sister    Hypertension Brother    Diverticulitis Brother    This information was confirmed by [name] [name].  Academic/Vocational History: Highest level of educational attainment: 15 years. 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 No relative weaknesses were identified.  History of developmental delay: Denied. History of grade repetition: Denied. Enrollment in special education courses: Denied. History of LD/ADHD: Denied.  Employment: Retired. She previously worked as a Marine scientist.  Evaluation Results:   Behavioral Observations: [name] [name] was unaccompanied, arrived to her appointment on time, and was appropriately dressed and groomed. She appeared alert and oriented. Observed gait and station were within normal limits. Gross motor functioning appeared intact upon informal observation and no abnormal movements (e.g., tremors) were noted. Her affect was generally relaxed and positive, but did range appropriately given the subject being discussed during the clinical interview or the task at hand during testing procedures. Spontaneous speech was fluent and word finding difficulties were not observed during the clinical interview. Thought processes were coherent, organized, and normal in content. Insight into her cognitive difficulties appeared appropriate.   During testing, sustained attention was appropriate. Task engagement was adequate and she persisted when challenged. Overall, [name] [name] was cooperative with the clinical interview and subsequent testing procedures.   Adequacy of Effort: The validity of  neuropsychological testing is limited by the extent to which the individual being tested may be assumed to have exerted adequate effort during testing. Ms.  [name] expressed her intention to perform to the best of her abilities and exhibited adequate task engagement and persistence. Scores across stand-alone and embedded performance validity measures were within expectation. As such, the results of the current evaluation are believed to be a valid representation of [name] [name]'s current cognitive functioning.  Test Results: [name] [name] was fully oriented at the time of the current evaluation.  Intellectual abilities based upon educational and vocational attainment were estimated to be in the average range. Premorbid abilities were estimated to be within the above average range based upon a single-word reading test.   Processing speed was mildly variable but overall appropriate, ranging from the below average to well above average normative ranges. Basic attention was average. More complex attention (e.g., working memory) was above average. Executive functioning was average to above average. She also performed in the exceptionally high range across a task assessing safety and judgment.  Assessed receptive language abilities were above average. Likewise, [name] [name] did not exhibit any difficulties comprehending task instructions and answered all questions asked of her appropriately. Assessed expressive language (e.g., verbal fluency and confrontation naming) was average to exceptionally high.     Assessed visuospatial/visuoconstructional abilities were average to well above average.    Learning (i.e., encoding) of novel verbal and visual information was average to well above average. Spontaneous delayed recall (i.e., retrieval) of previously learned information was also average to well above average. Performance across recognition tasks was strong, suggesting evidence for information consolidation.    Results of emotional screening instruments suggested that recent symptoms of generalized anxiety were in the minimal range, while symptoms of depression were within normal limits. A screening instrument assessing recent sleep quality suggested the presence of moderate sleep dysfunction.  Tables of Scores:   Note: This summary of test scores accompanies the interpretive report and should not be considered in isolation without reference to the appropriate sections in the text. Descriptors are based on appropriate normative data and may be adjusted based on clinical judgment. Terms such as "Within Normal Limits" and "Outside Normal Limits" are used when a more specific description of the test score cannot be determined.       Percentile - Normative Descriptor > 98 - Exceptionally High 91-97 - Well Above Average 75-90 - Above Average 25-74 - Average 9-24 - Below Average 2-8 - Well Below Average < 2 - Exceptionally Low       Orientation:      Raw Score Percentile   NAB Orientation, Form 1 29/29 --- ---       Cognitive Screening:      Raw Score Percentile   SLUMS: 30/30 --- ---       Intellectual Functioning:      Standard Score Percentile   Test of Premorbid Functioning: 116 86 Above Average       Memory:     NAB Memory Module, Form 1: Standard Score/ T Score Percentile   Total Memory Index 125 95 Well Above Average  List Learning       Total Trials 1-3 29/36 (64) 92 Well Above Average    List B 9/12 (71) 98 Exceptionally High    Short Delay Free Recall 11/12 (69) 91 Well Above Average    Long Delay Free Recall 11/12 (69) 97 Well Above Average    Retention Percentage 100 (51) 54 Average    Recognition Discriminability 12 (63) 2 Well Above Average  Shape Learning       Total Trials 1-3 17/27 (55)  69 Average    Delayed Recall 5/9 (47) 38 Average    Retention Percentage 63 (39) 14 Below Average    Recognition Discriminability 9 (62) 88 Above Average  Story Learning        Immediate Recall 63/80 (50) 50 Average    Delayed Recall 38/40 (64) 92 Well Above Average    Retention Percentage 100 (55) 69 Average  Daily Living Memory       Immediate Recall 49/51 (68) 96 Well Above Average    Delayed Recall 17/17 (62) 88 Above Average    Retention Percentage 100 (59) 82 Above Average    Recognition Hits 10/10 (60) 84 Above Average       Attention/Executive Function:     Trail Making Test (TMT): Raw Score (T Score) Percentile     Part A 44 secs.,  0 errors (42) 21 Below Average    Part B 56 secs.,  0 errors (58) 79 Above Average         Scaled Score Percentile   WAIS-IV Coding: 11 63 Average       NAB Attention Module, Form 1: T Score Percentile     Digits Forward 48 42 Average    Digits Backwards 62 88 Above Average        Scaled Score Percentile   WAIS-IV Similarities: 9 37 Average       D-KEFS Color-Word Interference Test: Raw Score (Scaled Score) Percentile     Color Naming 25 secs. (13) 84 Above Average    Word Reading 17 secs. (14) 91 Well Above Average    Inhibition 64 secs. (11) 63 Average      Total Errors 0 errors (12) 75 Above Average    Inhibition/Switching 77 secs. (10) 50 Average      Total Errors 1 error (12) 75 Above Average       D-KEFS Verbal Fluency Test: Raw Score (Scaled Score) Percentile     Letter Total Correct 49 (14) 91 Well Above Average    Category Total Correct 52 (17) 99 Exceptionally High    Category Switching Total Correct 15 (13) 84 Above Average    Category Switching Accuracy 12 (11) 63 Average      Total Set Loss Errors 4 (8) 25 Average      Total Repetition Errors 3 (10) 50 Average       NAB Executive Functions Module, Form 1: T Score Percentile     Judgment 76 >99 Exceptionally High       Language:     Verbal Fluency Test: Raw Score (T Score) Percentile     Phonemic Fluency (FAS) 49 (55) 69 Average    Animal Fluency 23 (59) 82 Above Average             NAB Language Module, Form 1: T Score Percentile      Auditory Comprehension 57 75 Above Average    Naming 31/31 (57) 75 Above Average       Visuospatial/Visuoconstruction:      Raw Score Percentile   Clock Drawing: 9/10 --- Within Normal Limits       NAB Spatial Module, Form 1: T Score Percentile     Figure Drawing Copy 66 95 Well Above Average        Scaled Score Percentile   WAIS-IV Block Design: 8 25 Average       Mood and Personality:      Raw Score Percentile   Beck Depression Inventory - II: 6 --- Within  Normal Limits  PROMIS Anxiety Questionnaire: 7 --- None to Slight       Additional Questionnaires:      Raw Score Percentile   PROMIS Sleep Disturbance Questionnaire: 35 --- Moderate   Informed Consent and Coding/Compliance:   The current evaluation represents a clinical evaluation for the purposes previously outlined by the referral source and is in no way reflective of a forensic evaluation.   [name] [name] was provided with a verbal description of the nature and purpose of the present neuropsychological evaluation. Also reviewed were the foreseeable risks and/or discomforts and benefits of the procedure, limits of confidentiality, and mandatory reporting requirements of this provider. The patient was given the opportunity to ask questions and receive answers about the evaluation. Oral consent to participate was provided by the patient.   This evaluation was conducted by Christia Reading, Ph.D., ABPP-CN, board certified clinical neuropsychologist. [name] [name] completed a clinical interview with Dr. Melvyn Novas, billed as one unit [phone], and 150 minutes of cognitive testing and scoring, billed as one unit [phone] and four additional units [id number]. Psychometrist Milana Kidney, B.S., assisted Dr. Melvyn Novas with test administration and scoring procedures. As a separate and discrete service, Dr. Melvyn Novas spent a total of 160 minutes in interpretation and report writing billed as one unit [phone] and two units [id number].

## 2022-07-19 ENCOUNTER — Encounter: Payer: Self-pay | Admitting: Psychology

## 2022-07-21 DIAGNOSIS — M722 Plantar fascial fibromatosis: Secondary | ICD-10-CM | POA: Diagnosis not present

## 2022-07-23 ENCOUNTER — Ambulatory Visit: Payer: Medicare Other | Attending: Interventional Cardiology

## 2022-07-23 DIAGNOSIS — I2510 Atherosclerotic heart disease of native coronary artery without angina pectoris: Secondary | ICD-10-CM

## 2022-07-23 DIAGNOSIS — Z87891 Personal history of nicotine dependence: Secondary | ICD-10-CM | POA: Diagnosis not present

## 2022-07-23 DIAGNOSIS — I10 Essential (primary) hypertension: Secondary | ICD-10-CM | POA: Diagnosis not present

## 2022-07-23 DIAGNOSIS — I2584 Coronary atherosclerosis due to calcified coronary lesion: Secondary | ICD-10-CM | POA: Diagnosis not present

## 2022-07-23 DIAGNOSIS — I5042 Chronic combined systolic (congestive) and diastolic (congestive) heart failure: Secondary | ICD-10-CM | POA: Diagnosis not present

## 2022-07-23 DIAGNOSIS — E785 Hyperlipidemia, unspecified: Secondary | ICD-10-CM | POA: Diagnosis not present

## 2022-07-23 DIAGNOSIS — I493 Ventricular premature depolarization: Secondary | ICD-10-CM

## 2022-07-24 LAB — BASIC METABOLIC PANEL
BUN/Creatinine Ratio: 16 (ref 12–28)
BUN: 12 mg/dL (ref 8–27)
CO2: 24 mmol/L (ref 20–29)
Calcium: 9.4 mg/dL (ref 8.7–10.3)
Chloride: 109 mmol/L — ABNORMAL HIGH (ref 96–106)
Creatinine, Ser: 0.73 mg/dL (ref 0.57–1.00)
Glucose: 92 mg/dL (ref 70–99)
Potassium: 3.7 mmol/L (ref 3.5–5.2)
Sodium: 147 mmol/L — ABNORMAL HIGH (ref 134–144)
eGFR: 90 mL/min/{1.73_m2} (ref >59)

## 2022-07-26 ENCOUNTER — Telehealth: Payer: Self-pay | Admitting: Interventional Cardiology

## 2022-07-26 ENCOUNTER — Ambulatory Visit (INDEPENDENT_AMBULATORY_CARE_PROVIDER_SITE_OTHER): Payer: Medicare Other | Admitting: Psychology

## 2022-07-26 DIAGNOSIS — R4189 Other symptoms and signs involving cognitive functions and awareness: Secondary | ICD-10-CM | POA: Diagnosis not present

## 2022-07-26 DIAGNOSIS — G4733 Obstructive sleep apnea (adult) (pediatric): Secondary | ICD-10-CM

## 2022-07-26 NOTE — Telephone Encounter (Signed)
-----   Message from Jettie Booze, MD sent at 07/26/2022  2:19 PM EST ----- Kidney function and potassium stable.  Sodium mildly increased.  Stay well hydrated.  How is BP. Continue current Entresto  dose.

## 2022-07-26 NOTE — Telephone Encounter (Signed)
Patient is returning call to discuss lab results. 

## 2022-07-26 NOTE — Progress Notes (Signed)
   Neuropsychology Feedback Session [name] [name]. [name] [name] Department of Neurology  Reason for Referral:   [name] [name] is a 67 y.o. mixed-handed Caucasian female referred by  Larey Seat, M.D. , to characterize her current cognitive functioning and assist with diagnostic clarity and treatment planning in the context of subjective cognitive decline.   Feedback:   [name] [name] completed a comprehensive neuropsychological evaluation on 07/16/2022. Please refer to that encounter for the full report and recommendations. Briefly, results suggested neuropsychological functioning within normal limits relative to age-matched peers. No cognitive domains exhibited impairment. Performances were appropriate across all assessed domains. This includes processing speed, attention/concentration, executive functioning, safety/judgment, receptive and expressive language, visuospatial abilities, and all aspects of learning and memory.   [name] [name] was unaccompanied during the current feedback session. Content of the current session focused on the results of her neuropsychological evaluation. [name] [name] was given the opportunity to ask questions and her questions were answered. She was encouraged to reach out should additional questions arise. A copy of her report was provided at the conclusion of the visit.      20 minutes were spent conducting the current feedback session with [name] [name], billed as one unit [phone].

## 2022-07-26 NOTE — Telephone Encounter (Signed)
Patient notified.  She is going to start checking her BP this week.  She will send readings in through my chart in about a week.

## 2022-07-27 DIAGNOSIS — M722 Plantar fascial fibromatosis: Secondary | ICD-10-CM | POA: Diagnosis not present

## 2022-07-28 DIAGNOSIS — M180 Bilateral primary osteoarthritis of first carpometacarpal joints: Secondary | ICD-10-CM | POA: Diagnosis not present

## 2022-08-02 ENCOUNTER — Ambulatory Visit: Payer: Medicare Other | Admitting: Neurology

## 2022-08-02 DIAGNOSIS — M722 Plantar fascial fibromatosis: Secondary | ICD-10-CM | POA: Diagnosis not present

## 2022-08-09 DIAGNOSIS — M722 Plantar fascial fibromatosis: Secondary | ICD-10-CM | POA: Diagnosis not present

## 2022-08-23 DIAGNOSIS — S61442A Puncture wound with foreign body of left hand, initial encounter: Secondary | ICD-10-CM | POA: Diagnosis not present

## 2022-08-23 DIAGNOSIS — I10 Essential (primary) hypertension: Secondary | ICD-10-CM | POA: Diagnosis not present

## 2022-08-23 DIAGNOSIS — W5501XA Bitten by cat, initial encounter: Secondary | ICD-10-CM | POA: Diagnosis not present

## 2022-08-23 DIAGNOSIS — M722 Plantar fascial fibromatosis: Secondary | ICD-10-CM | POA: Diagnosis not present

## 2022-09-01 DIAGNOSIS — M722 Plantar fascial fibromatosis: Secondary | ICD-10-CM | POA: Diagnosis not present

## 2022-09-08 DIAGNOSIS — M722 Plantar fascial fibromatosis: Secondary | ICD-10-CM | POA: Diagnosis not present

## 2022-09-27 DIAGNOSIS — M722 Plantar fascial fibromatosis: Secondary | ICD-10-CM | POA: Diagnosis not present

## 2022-10-11 ENCOUNTER — Ambulatory Visit (INDEPENDENT_AMBULATORY_CARE_PROVIDER_SITE_OTHER): Payer: Medicare Other | Admitting: Neurology

## 2022-10-11 ENCOUNTER — Encounter: Payer: Self-pay | Admitting: Neurology

## 2022-10-11 VITALS — BP 136/80 | HR 99 | Ht 65.0 in | Wt 177.2 lb

## 2022-10-11 DIAGNOSIS — R4189 Other symptoms and signs involving cognitive functions and awareness: Secondary | ICD-10-CM | POA: Diagnosis not present

## 2022-10-11 MED ORDER — BELSOMRA 10 MG PO TABS: 10.0000 mg | ORAL_TABLET | Freq: Every evening | ORAL | 2 refills | Status: DC | PRN

## 2022-10-11 NOTE — Progress Notes (Signed)
Provider:  Melvyn Novas, MD  Primary Care Physician:  Alysia Penna, MD [address] [place] [address]     Referring Provider: Alysia Penna, Md [address] [place],  [place] [address]          Chief Complaint according to patient   Patient presents with:     New Patient (Initial Visit)           HISTORY OF PRESENT ILLNESS:  [name] [name] is a 67 y.o. female patient who is here for revisit 10/11/2022 for  MCI.  Chief concern according to patient :  here for memory follow up-  she has seen Urology for nocturia and tried Detrol, but her mouth was to dry. She went through pelvic PT.  She spoke to her cardiologist , dx with CHF 40-45% , on entresto , spoke about her mild sleep apnea and he also did not feel that CPAP was needed.  She reports mild COPD ,  has some SOB with exertion.   Had testing with Dr Milbert Coulter. [name] [name] completed a comprehensive neuropsychological evaluation on 07/16/2022.  [name] was seen by [facility] Melvyn Novas, M.D.) on 10/08/2021 for follow-up of sleep and memory concerns. Regarding the former, a recent sleep study suggested very mild sleep apnea to the extent that the use of CPAP intervention was voluntary. Several bathroom breaks were noted disrupting overall sleep attainment. Regarding cognition, [name] [name] described word finding difficulties and generalized forgetfulness. ADLs were intact. Performance on a brief cognitive screening instrument (MOCA) was 23/30. Ultimately, Ms. Judy was referred for a comprehensive neuropsychological evaluation to characterize her cognitive abilities and to assist with diagnostic clarity and treatment planning.    Brain MRI on 07/11/2021 was unremarkable.Please refer to that encounter for the full report and recommendations. Briefly, results suggested neuropsychological functioning within normal limits relative to age-matched peers. No cognitive domains exhibited  impairment. Performances were appropriate across all assessed domains. This includes processing speed, attention/concentration, executive functioning, safety/judgment, receptive and expressive language, visuospatial abilities, and all aspects of learning and memory. Subjective cognitive dysfunction may simply represent age-related and age-appropriate changes based upon current testing. Dysfunction could also be influenced by sleep disturbances (she scored in the moderate range across a related questionnaire), various medical ailments (e.g., cardiovascular conditions, elevated glucose levels), and somatic preoccupation surrounding various physical ailments.    Memory performances were excellent and a relative strength across an already strong testing profile. More specifically, [name] [name] was able to learn novel verbal and visual information efficiently and retain this knowledge after lengthy delays. Overall, memory performance combined with intact performances across other areas of cognitive functioning is not suggestive of Alzheimer's disease. Likewise, her cognitive and behavioral profile is not suggestive of any other form of neurodegenerative illness presently.     01-21-2022   [name] is a 67 y.o.Caucasian female patient seen here as a referral on 01/21/2022 from Dr Anne Hahn, for a sleep evaluation. My second visit was on 10-08-2021 she was here to address sleep and memory issues. Now she is her for atypical migraines, on 01-21-2022.Pt now referred for evaluation of migraines. Pt was on verapamil for 11 years for migraines, but about 2 years ago pt was taken of due to heart problems. She then switched to losartin. Pt sx started back about a month ago, pt started having migraines again w partial vision loss. No more migraines since Verapamil was d/c.  [name] [name] reports that  for cardiac reasons her cardiologist Dr. Verdis Prime had to switch her from verapamil which had protected her for years  from these ophthalmic migraines to losartan 18 months ago- about 3 or 4 weeks ago she had 2 episodes of central scotoma.  She described looking at sign and can only see part of a street name or headline.  She is not sure if this is a bilateral vision restriction, but I suspect it is. The missing part of her visual field is neither black not blurred- no floaters.  No associated balance problems, weakness, numbness. No speech problems. Self resolving symptoms within 60 minutes    PS : She is sleeping better overall on Belsomra.  She is awaiting neuropsychological memory testing with Dr Milbert Coulter.          10-08-2021:  Dr .Anne Hahn did MMSE and MRI and had her followed by NP. She reports slower word finding, naming, and some spatial orientation difficulties with finding the car in a car park. Today is 08 Oct 2021 and I am meeting today with [name] [name] to discuss the results of her sleep study which had been performed in February of this year on 2-6 2023 upon referral from Dr. Anne Hahn.  The patient had reported nonrestorative nonrefreshing sleep.   Difficulties to fall asleep and to stay asleep.  She had an AHI of 8.5/h which is very mild sleep apnea only during REM sleep was AHI 21.1/h.  In supine sleep there were more apnea spent in nonsupine.  She had only 4 minutes of hypoxia which is insignificant.  She did have frequent periodic limb movements but this did not lead to arousals.  There were also no REM no limb movements during REM sleep which is a good sign.  Normal sinus rhythm was seen on her cardiac channel.  She had 3 bathroom breaks which is a higher number than usually seen.  And she had a lot of spontaneous arousals with either can indicate chronic insomnia but can also be related to underlying pain or anxiety or depression.  The treatment of such mild apnea would be considered optional.  If she would want treatment I would recommend CPAP but we meet today to discuss that further. She opted out of CPAP.    Her memory will also be tested today:  MOCA: 23/ 30       06-24-2021: Referral from Dr. Anne Hahn, who followed her for memory disorder associated with fatigue and frequent awakening. Wants evaluation to see if her forgetfulness is coming from possible sleep apnea.  Pt is not aware of any snoring or apneic episodes.  Dr .Anne Hahn did MMSE and MRI and had her followed by NP for several visits. .  She reports slower word finding, naming, and some spatial orientation difficulties with finding the car in a car park.   Sleep relevant medical history: Her sleep is not refreshing, fragmented, tossing and turning. Initiating sleep is difficult and insomnia also following sleep interruption after nocturia 2-4 times at night.  She has had a tonsillectomy and adenoidectomy- age 62, DDD cervical spine, enough degeneration affecting  her barium swallow test- no osteoporosis. Thyroid biopsy.  Former smoker, quit 2014. After her husband passed she used low dose ativan and did not like the hang- over effect.  Lung Cancer was dx by chest CT and she underwent radiation- this may have caused the COPD and cardiomyopathy with EF now at 45%  [name]   has a past medical history of Anxiety,  Cancer [facility]), Complication of anesthesia, Congenital renal atrophy, COPD (chronic obstructive pulmonary disease) (HCC), Coronary artery disease, Degenerative arthritis, GERD (gastroesophageal reflux disease), History of migraine, Hypertension, Memory disorder (03/29/2017), Migraine equivalent (11/09/2012), PONV (postoperative nausea and vomiting), PVC (premature ventricular contraction), Shortness of breath dyspnea, Thyroid nodule, Varicose veins, Visual disturbance, Vitamin D deficiency, and Wears glasses.   Family medical /sleep history: non diagnosed family member on CPAP with OSA, many people have insomnia, no sleep walkers.  2 sisters, a brother, mother with insomnia.    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 Family status is widowed 4/ 2018 , with adult children. She has cats and dogs. A daughter has moved back in.   The patient used to work in shifts( night/ rotating,) Tobacco use quit 2014.   ETOH use - rare ,  Caffeine intake in form of Coffee( 1 cup in AM) Soda( /) Tea ( /) or energy drinks.    Review of Systems: Out of a complete 14 system review, the patient complains of only the following symptoms, and all other reviewed systems are negative.:      10/08/2021    2:45 PM  Montreal Cognitive Assessment   Visuospatial/ Executive (0/5) 2  Naming (0/3) 2  Attention: Read list of digits (0/2) 2  Attention: Read list of letters (0/1) 1  Attention: Serial 7 subtraction starting at 100 (0/3) 3  Language: Repeat phrase (0/2) 2  Language : Fluency (0/1) 1  Abstraction (0/2) 2  Delayed Recall (0/5) 3  Orientation (0/6) 5  Total 23    27/ 30  today !!    10/11/2022    1:20 PM 01/21/2021    9:47 AM 05/22/2018   10:04 AM 09/29/2017    2:29 PM 03/29/2017    2:07 PM  MMSE - Mini Mental State Exam  Orientation to time 5 5 4 5 5   Orientation to Place 5 5 5 5 5   Registration 3 3 3 3 3   Attention/ Calculation 5 5 5 5 5   Recall 3 3 3 2 3   Language- name 2 objects 2 2 2 2 2   Language- repeat 1 1 1 1 1   Language- follow 3 step command 3 3 3 3 3   Language- read & follow direction 1 1 1 1 1   Write a sentence 1 1 1 1 1   Copy design 1 0 1 1 1   Total score 30 29 29 29 30       Social History   Socioeconomic History   Marital status: Widowed    Spouse name: Not on file   Number of children: 1   Years of education: 15   Highest education level: Associate degree: academic program  Occupational History   Occupation: Retired    Comment: Psychologist, forensic  Tobacco Use   Smoking status: Former    Packs/day: 0.50    Years: 40.00    Additional pack years: 0.00    Total pack years: 20.00    Types: Cigarettes    Quit date: 03/17/2013     Years since quitting: 9.5   Smokeless tobacco: Never  Vaping Use   Vaping Use: Never used  Substance and Sexual Activity   Alcohol use: Not Currently    Comment: " rare"   Drug use: No   Sexual activity: Not on file  Other Topics Concern   Not on file  Social History Narrative   Lives  w daughter   Caffeine use: 1-2 cups coffee per day   Right handed    Social Determinants of Health   Financial Resource Strain: Not on file  Food Insecurity: Not on file  Transportation Needs: Not on file  Physical Activity: Not on file  Stress: Not on file  Social Connections: Not on file    Family History  Problem Relation Age of Onset   Cancer Mother        cancer of the small intestines   Hypertension Father    Atrial fibrillation Father    Diverticulitis Father    Hypertension Sister    Hypertension Sister    Headache Sister    Hypertension Brother    Diverticulitis Brother     Past Medical History:  Diagnosis Date   Arthritis of hand 12/04/2021   CAD (coronary artery disease)    Central scotoma, 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 she was given albuterol inhaler & had steroid with surg.     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, transient, takes Verapimil for migraine & BP control    Mild obstructive sleep apnea 07/28/2021   Pain of right thumb 06/24/2017   Plantar fasciitis of left foot 03/12/2022   PONV (postoperative nausea and  vomiting)    Primary cancer of right lower lobe of lung 04/17/2014   Shortness of breath 09/17/2019   Sprain of lateral ligament of ankle joint 09/11/2021   Subjective memory complaints 07/16/2022   Tenosynovitis of both hands 12/04/2021   Thyroid nodule    benign by biopsy   Tightness of left gastrocnemius muscle 03/12/2022   Transient visual loss 01/25/2012   Varicose veins    left leg   Vitamin D deficiency    Wears glasses     Past Surgical History:  Procedure Laterality Date   CARPAL TUNNEL RELEASE Left    CESAREAN SECTION     COLONOSCOPY W/ BIOPSIES AND POLYPECTOMY     DILATION AND CURETTAGE OF UTERUS     TONSILLECTOMY     VARICOSE VEIN SURGERY     left   VIDEO ASSISTED THORACOSCOPY (VATS)/ LOBECTOMY Right 07/23/2014   Procedure: VIDEO ASSISTED THORACOSCOPY (VATS)/ LOBECTOMY;  Surgeon: Kerin Perna, MD;  Location: [facility] OR;  Service: Thoracic;  Laterality: Right;   VIDEO BRONCHOSCOPY N/A 07/23/2014   Procedure: VIDEO BRONCHOSCOPY;  Surgeon: Kerin Perna, MD;  Location: [facility] OR;  Service: Thoracic;  Laterality: N/A;   VIDEO BRONCHOSCOPY WITH ENDOBRONCHIAL ULTRASOUND Right 04/17/2014   Procedure: VIDEO BRONCHOSCOPY WITH ENDOBRONCHIAL ULTRASOUND;  Surgeon: Leslye Peer, MD;  Location: [facility] OR;  Service: Thoracic;  Laterality: Right;   VIDEO BRONCHOSCOPY WITH ENDOBRONCHIAL ULTRASOUND N/A 07/08/2014   Procedure: VIDEO BRONCHOSCOPY WITH ENDOBRONCHIAL ULTRASOUND;  Surgeon: Kerin Perna, MD;  Location: [facility] OR;  Service: Thoracic;  Laterality: N/A;     Current Outpatient Medications on File Prior to Visit  Medication Sig  Dispense Refill   aspirin EC 81 MG tablet Take 1 tablet (81 mg total) by mouth daily. Swallow whole. 30 tablet 12   loratadine (CLARITIN) 10 MG tablet Take 10 mg by mouth daily as needed.     nitroGLYCERIN (NITROSTAT) 0.4 MG SL tablet Place 1 tablet (0.4 mg total) under the tongue every 5 (five) minutes as needed for chest pain. 25 tablet 6   rosuvastatin  (CRESTOR) 10 MG tablet Take 1 tablet (10 mg total) by mouth daily. 90 tablet 3   sacubitril-valsartan (ENTRESTO) 49-51 MG Take 1 tablet by mouth 2 (two) times daily. 60 tablet 6   Suvorexant (BELSOMRA) 10 MG TABS Take 10 mg by mouth at bedtime as needed. 30 tablet 2   VENTOLIN HFA 108 (90 Base) MCG/ACT inhaler INHALE 1 PUFF BY MOUTH THREE TIMES DAILY AS NEEDED FOR WHEEZING FOR SHORTNESS OF BREATH 18 g 0   Vitamin D, Ergocalciferol, (DRISDOL) 50000 units CAPS capsule Take 50,000 Units by mouth every 7 (seven) days.     Current Facility-Administered Medications on File Prior to Visit  Medication Dose Route Frequency Provider Last Rate Last Admin   Ampicillin-Sulbactam (UNASYN) 3 g in sodium chloride 0.9 % 100 mL IVPB  3 g Intravenous Once Karie Chimera, PA-C        Allergies  Allergen Reactions   Advil [Ibuprofen] Other (See Comments)    Causes hematuria    Amoxicillin-Pot Clavulanate Nausea And Vomiting and Other (See Comments)   Carvedilol     SOB   Codeine Hives   Hydrocodone-Guaifenesin Itching   Sulfa Antibiotics Hives and Swelling   Temazepam Other (See Comments)    dizziness   Plasticized Base [Plastibase] Rash    tape   Zofran [Ondansetron Hcl] Palpitations     DIAGNOSTIC DATA (LABS, IMAGING, TESTING) - I reviewed patient records, labs, notes, testing and imaging myself where available.  Lab Results  Component Value Date   WBC 6.5 02/15/2022   HGB 14.5 02/15/2022   HCT 43.8 02/15/2022   MCV 90.7 02/15/2022   PLT 270 02/15/2022      Component Value Date/Time   NA 147 (H) 07/23/2022 1303   NA 142 01/19/2017 0958   K 3.7 07/23/2022 1303   K 4.3 01/19/2017 0958   CL 109 (H) 07/23/2022 1303   CO2 24 07/23/2022 1303   CO2 25 01/19/2017 0958   GLUCOSE 92 07/23/2022 1303   GLUCOSE 117 (H) 02/15/2022 1000   GLUCOSE 101 01/19/2017 0958   BUN 12 07/23/2022 1303   BUN 15.2 01/19/2017 0958   CREATININE 0.73 07/23/2022 1303   CREATININE 0.84 02/15/2022 1000    CREATININE 0.8 01/19/2017 0958   CALCIUM 9.4 07/23/2022 1303   CALCIUM 9.5 01/19/2017 0958   PROT 7.1 02/15/2022 1000   PROT 7.2 06/24/2021 1133   PROT 6.9 01/19/2017 0958   ALBUMIN 4.3 02/15/2022 1000   ALBUMIN 4.5 06/24/2021 1133   ALBUMIN 3.7 01/19/2017 0958   AST 16 02/15/2022 1000   AST 19 01/19/2017 0958   ALT 11 02/15/2022 1000   ALT 20 01/19/2017 0958   ALKPHOS 95 02/15/2022 1000   ALKPHOS 121 01/19/2017 0958   BILITOT 0.6 02/15/2022 1000   BILITOT 0.64 01/19/2017 0958   GFRNONAA >60 02/15/2022 1000   GFRNONAA >89 08/08/2014 1439   GFRAA >60 02/05/2020 1044   GFRAA >89 08/08/2014 1439   Lab Results  Component Value Date   CHOL 127 11/14/2020   HDL 68 11/14/2020   LDLCALC 48 11/14/2020  TRIG 46 11/14/2020   CHOLHDL 1.9 11/14/2020   No results found for: "HGBA1C" Lab Results  Component Value Date   VITAMINB12 315 06/24/2021   Lab Results  Component Value Date   TSH 0.772 06/24/2021    PHYSICAL EXAM:  Today's Vitals   10/11/22 1312  BP: 136/80  Pulse: 99  Weight: 177 lb 3.2 oz (80.4 kg)  Height: 5\' 5"  (1.651 m)   Body mass index is 29.49 kg/m.   Wt Readings from Last 3 Encounters:  10/11/22 177 lb 3.2 oz (80.4 kg)  07/07/22 174 lb 3.2 oz (79 kg)  05/25/22 172 lb 9.6 oz (78.3 kg)     Ht Readings from Last 3 Encounters:  10/11/22 5\' 5"  (1.651 m)  07/07/22 5' 5.5" (1.664 m)  05/25/22 5' 5.5" (1.664 m)      General:  The patient is awake, alert and appears not in acute distress. The patient is well groomed. Head: Normocephalic, atraumatic.  Neck is supple. Mallampati 3  neck circumference:14 inches . Nasal airflow  patent. Prognathia is seen.  Dental status: biological teeth  Cardiovascular:  Regular rate and cardiac rhythm by pulse,  without distended neck veins. Respiratory: Lungs are clear to auscultation.  Skin:  Without evidence of ankle edema, or rash. Trunk: The patient's posture is erect.   Neurologic exam : The patient is awake and  alert, oriented to place and time.   Memory subjective described as intact.  Attention span & concentration ability appears normal.  Speech is slowed, non-fluent,  without  dysarthria, dysphonia .  Mood and affect are appropriate.   Cranial nerves: no loss of smell or taste reported  Pupils are equal and briskly reactive to light. Funduscopic exam deferred..  Extraocular movements in vertical and horizontal planes were intact and without nystagmus. No Diplopia. Visual fields by finger perimetry are intact. Hearing was intact to soft voice and finger rubbing.   Facial sensation intact to fine touch. Facial motor strength is symmetric and tongue and uvula move midline.  Neck ROM : rotation, tilt and flexion extension were normal for age and shoulder shrug was symmetrical.    Motor exam:  Symmetric bulk, tone and ROM.   Normal tone without cog -wheeling, symmetric grip strength .   Sensory:  Fine touch, pinprick and vibration were tested  and  normal.  Proprioception tested in the upper extremities was normal.   Coordination: Rapid alternating movements in the fingers/hands were of normal speed.  The Finger-to-nose maneuver was intact without evidence of ataxia, dysmetria or tremor. Arthritic changes to fingers.  Gait and station: Patient could rise unassisted from a seated position, walked without assistive device.  Stance is of normal width/ base a.  Toe and heel walk were deferred.  Deep tendon reflexes: in the upper and lower extremities are symmetric and intact.  Babinski response was deferred.         ASSESSMENT AND PLAN 67 y.o. year old female  here with:    1)  confirmed stabilized memory, subjective reports of anecdotal "misses" but MOCA improved to 27/ 30 and MMSE 30/ 30. I thank Dr Rachel Bo for the detailed testing and report. 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 We continue to screen by [facility] yearly-   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   Now improved after d/c of Verapamil.[name] Kitchen  3) chronic insomnia, mildest apnea: no CPAP. She can use Belsomra prn. I renewed her present script, as she is changing health plans  and pharmacies. Family history, 2 sisters and one brother, daughter, nieces all with insomnia.    I plan to follow up either personally or through our NP within 12 months.   I would like to thank Alysia Penna, MD  for allowing me to meet with and to take care of this pleasant patient.    After spending a total time of  35  minutes face to face and additional time for physical and neurologic examination, review of laboratory studies,  personal review of imaging studies, reports and results of other testing and review of referral information / records as far as provided in visit,   Electronically signed by: Melvyn Novas, MD 10/11/2022 2:01 PM  [facility] Board certified by The [employer] of Sleep Medicine and Diplomate of the [employer] of Sleep Medicine. Board certified In Neurology through the ABPN, Fellow of the [employer] of Neurology. Medical Director of [facility].

## 2022-10-11 NOTE — Patient Instructions (Addendum)
ASSESSMENT AND PLAN 67 y.o. year old female  here with:     1)  confirmed stabilized memory, subjective reports of anecdotal "misses" but MOCA improved to 27/ 30 and MMSE 30/ 30. I thank Dr Rachel Bo for the detailed testing and report. 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 We continue to screen by [facility] yearly- she is mostly impaired during multitasking, sometimes thrown off when are interrupted while doing routine tasks, almost an apraxia.    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  Now improved after d/c of Verapamil.   3) chronic insomnia, mildest apnea: no CPAP needed . Yes, she can use Belsomra prn. I renewed her present script, as she is changing health plans and pharmacies. Family history, 2 sisters and one brother, daughter, nieces all with insomnia.      I plan to follow up either personally or through our NP within 12 months.      Mild Neurocognitive Disorder Mild neurocognitive disorder, formerly known as mild cognitive impairment, is a disorder in which memory does not work as well as it should. This disorder may also cause problems with other mental functions, including thought, communication, behavior, and completion of tasks. These problems can be noticed and measured, but they usually do not interfere with daily activities or the ability to live independently. Mild neurocognitive disorder typically develops after 69 years of age, but it can also develop at younger ages. It is not as serious as major neurocognitive disorder, also known as dementia, but it may be the first sign of it. Generally, symptoms of this condition get worse over time. In rare cases, symptoms can get better. What are the causes? This condition may be caused by: Brain disorders like Alzheimer's disease, Parkinson's disease, and other conditions that gradually damage nerve cells (neurodegenerative conditions). Diseases that affect blood vessels in the brain and result in small  strokes. Certain infections, such as HIV. Traumatic brain injury. Other medical conditions, such as brain tumors, underactive thyroid (hypothyroidism), and vitamin B12 deficiency. Use of certain drugs or prescription medicines. What increases the risk? The following factors may make you more likely to develop this condition: Being older than 65 years. Being female. Low education level. Diabetes, high blood pressure, high cholesterol, and other conditions that increase the risk for blood vessel diseases. Untreated or undertreated sleep apnea. Having a certain type of gene that can be passed from parent to child (inherited). Chronic health problems such as heart disease, lung disease, liver disease, kidney disease, or depression. What are the signs or symptoms? Symptoms of this condition include: Difficulty remembering. You may: Forget names, phone numbers, or details of recent events. Forget social events and appointments. Repeatedly forget where you put your car keys or other items. Difficulty thinking and solving problems. You may have trouble with complex tasks, such as: Paying bills. Driving in unfamiliar places. Difficulty communicating. You may have trouble: Finding the right word or naming an object. Forming a sentence that makes sense, or understanding what you read or hear. Changes in your behavior or personality. When this happens, you may: Lose interest in the things that you used to enjoy. Withdraw from social situations. Get angry more easily than usual. Act before thinking. How is this diagnosed? This condition is diagnosed based on: Your symptoms. Your health care provider may ask you and the people you spend time with, such as family and friends, about your symptoms. Evaluation of mental functions (neuropsychological testing). Your health care provider may refer  you to a neurologist or mental health specialist to evaluate your mental functions in detail. To identify  the cause of your condition, your health care provider may: Get a detailed medical history. Ask about use of alcohol, drugs, and prescription medicines. Do a physical exam. Order blood tests and brain imaging exams. How is this treated? Mild neurocognitive disorder that is caused by medicine use, drug use, infection, or another medical condition may improve when the cause is treated, or when medicines or drugs are stopped. If this disorder has another cause, it generally does not improve and may get worse. In these cases, the goal of treatment is to help you manage the loss of mental function. Treatments in these cases include: Medicine. Medicine mainly helps memory and behavior symptoms. Talk therapy. Talk therapy provides education, emotional support, memory aids, and other ways of making up for problems with mental function. Lifestyle changes, including: Getting regular exercise. Eating a healthy diet that includes omega-3 fatty acids. Challenging your thinking and memory skills. Having more social interaction. Follow these instructions at home: Eating and drinking  Drink enough fluid to keep your urine pale yellow. Eat a healthy diet that includes omega-3 fatty acids. These can be found in: Fish. Nuts. Leafy vegetables. Vegetable oils. If you drink alcohol: Limit how much you use to: 0-1 drink a day for women. 0-2 drinks a day for men. Be aware of how much alcohol is in your drink. In the [place] one drink equals one 12 oz bottle of beer (355 mL), one 5 oz glass of wine (148 mL), or one 1 oz glass of hard liquor (44 mL). Lifestyle  Get regular exercise as told by your health care provider. Do not use any products that contain nicotine or tobacco, such as cigarettes, e-cigarettes, and chewing tobacco. If you need help quitting, ask your health care provider. Practice ways to manage stress. If you need help managing stress, ask your health care provider. Continue to have social  interaction. Keep your mind active with stimulating activities you enjoy, such as reading or playing games. Make sure to get quality sleep. Follow these tips: Avoid napping during the day. Keep your sleeping area dark and cool. Avoid exercising during the few hours before you go to bed. Avoid caffeine products in the evening. General instructions Take over-the-counter and prescription medicines only as told by your health care provider. Your health care provider may recommend that you avoid taking medicines that can affect thinking, such as pain medicines or sleep medicines. Work with your health care provider to find out what you need help with and what your safety needs are. Keep all follow-up visits. This is important. Where to find more information [employer] on Aging: https://walker.com/ Contact a health care provider if: You have any new symptoms. Get help right away if: You develop new confusion or your confusion gets worse. You act in ways that place you or your family in danger. Summary Mild neurocognitive disorder is a disorder in which memory does not work as well as it should. Mild neurocognitive disorder can have many causes. It may be the first stage of dementia. To manage your condition, get regular exercise, keep your mind active, get quality sleep, and eat a healthy diet. This information is not intended to replace advice given to you by your health care provider. Make sure you discuss any questions you have with your health care provider. Document Revised: 10/08/2019 Document Reviewed: 10/08/2019 Elsevier Patient Education  2023 [employer].

## 2022-12-13 DIAGNOSIS — E538 Deficiency of other specified B group vitamins: Secondary | ICD-10-CM | POA: Diagnosis not present

## 2023-01-12 ENCOUNTER — Ambulatory Visit (INDEPENDENT_AMBULATORY_CARE_PROVIDER_SITE_OTHER): Payer: Medicare Other | Admitting: Podiatry

## 2023-01-12 ENCOUNTER — Encounter: Payer: Self-pay | Admitting: Podiatry

## 2023-01-12 DIAGNOSIS — L600 Ingrowing nail: Secondary | ICD-10-CM | POA: Diagnosis not present

## 2023-01-12 NOTE — Patient Instructions (Signed)

## 2023-01-14 NOTE — Progress Notes (Signed)
Subjective:   Patient ID: [name] [name], female   DOB: 67 y.o.   MRN: [id number]   HPI Patient states she is got very painful toenail left it has been thick and she cannot take care of but she knows she needs it removed   ROS      Objective:  Physical Exam  Neuro vascular status intact damage thickened left hallux toenail moderately loose when pressed pressing against the underlying nailbed     Assessment:  Significant pain left hallux toenail with chronic deformity of the bed     Plan:  H&P reviewed I do think given the chronic nature deformity and pain it would be best removed permanently and I explained procedure risk and allowed her to read then signed consent form after review.  Patient at this time have a left hallux infiltrated 60 mg like Marcaine mixture sterile prep done using sterile instrumentation remove the hallux nail exposed matrix applied phenol for applications 30 seconds followed by alcohol lavage sterile dressing gave instructions on soaks reappoint to recheck encouraged to wear dressing 24 hours take it off earlier if throbbing were to occur

## 2023-01-19 ENCOUNTER — Telehealth: Payer: Self-pay

## 2023-01-19 ENCOUNTER — Ambulatory Visit: Payer: Medicare Other | Admitting: Emergency Medicine

## 2023-01-19 ENCOUNTER — Encounter: Payer: Self-pay | Admitting: Emergency Medicine

## 2023-01-19 VITALS — BP 112/68 | HR 106 | Resp 16 | Ht 65.0 in | Wt 177.6 lb

## 2023-01-19 DIAGNOSIS — E559 Vitamin D deficiency, unspecified: Secondary | ICD-10-CM | POA: Diagnosis not present

## 2023-01-19 DIAGNOSIS — Z79899 Other long term (current) drug therapy: Secondary | ICD-10-CM | POA: Diagnosis not present

## 2023-01-19 DIAGNOSIS — I110 Hypertensive heart disease with heart failure: Secondary | ICD-10-CM | POA: Diagnosis not present

## 2023-01-19 DIAGNOSIS — R7989 Other specified abnormal findings of blood chemistry: Secondary | ICD-10-CM | POA: Diagnosis not present

## 2023-01-19 DIAGNOSIS — J449 Chronic obstructive pulmonary disease, unspecified: Secondary | ICD-10-CM

## 2023-01-19 DIAGNOSIS — I5020 Unspecified systolic (congestive) heart failure: Secondary | ICD-10-CM | POA: Diagnosis not present

## 2023-01-19 DIAGNOSIS — E785 Hyperlipidemia, unspecified: Secondary | ICD-10-CM | POA: Diagnosis not present

## 2023-01-19 MED ORDER — STIOLTO RESPIMAT 2.5-2.5 MCG/ACT IN AERS: 2.0000 | INHALATION_SPRAY | Freq: Every day | RESPIRATORY_TRACT | Status: DC

## 2023-01-19 NOTE — Patient Instructions (Signed)
Follow-up with cardiology as planned We will retry Stiolto 2 puffs once daily.  Keep track of how this medication helps your breathing.  If you benefit then we will continue it going forward. Agree with working on your exercise and conditioning Follow-up with APP in 6 weeks to review how you are doing on the Stiolto Follow with Dr. Delton Coombes in 6 months, sooner if you have problems.

## 2023-01-19 NOTE — Telephone Encounter (Signed)
Patient asked what she could take for the congestion in her throat and Dr Delton Coombes has said she could use Mucinex. I have notified the patient of this information.

## 2023-01-19 NOTE — Assessment & Plan Note (Signed)
Progressive dyspnea and decreasing exercise tolerance.  May be multifactorial, including some degree of deconditioning and weight gain.  She is following with cardiology and may have her Entresto dose adjusted in the near future.  I would like to try her on scheduled BD therapy to see if she gets benefit.  Will retry [employer]

## 2023-01-19 NOTE — Progress Notes (Signed)
Subjective:    Patient ID: [name] [name], female    DOB: 12/16/1955, 67 y.o.   MRN: [id number]  COPD She complains of cough and shortness of breath. There is no wheezing. Pertinent negatives include no ear pain, fever, headaches, postnasal drip, rhinorrhea, sneezing, sore throat or trouble swallowing. Her past medical history is significant for COPD.    ROV 05/25/22 --[name] [name] is 67 with history of right lower lobe lobectomy for adenocarcinoma with neoadjuvant chemo, COPD, CAD, tension with diastolic in systolic CHF (EF 93-23%).  As of our last visit 1 year ago she was no longer on scheduled maintenance BD therapy (previously on Stiolto).  She had an acute flare in July thought due to either exposures and allergies versus URI.  She was seen in office at that time, received pred.  Today she reports that she has been following with cardiology, was recently started on carvedilol, is on Entresto.  Her activity level has been curtailed by plantar fasciitis. She believes the b-blocker may have worsened her breathing - based on this the carvedilol was stopped and she went [place]. A repeat echo is pending.  Her breathing did improve some off the carvedilol. She is still having some exertional SOB and fatigue. She has used some albuterol occasionally.   CT chest 02/16/2022 reviewed by me showed unchanged postoperative findings without any evidence of recurrent or metastatic disease.  ROV 01/19/2023 --follow-up visit for 67 year old woman with a history of a right lower lobe lobectomy and neoadjuvant chemo for adenocarcinoma.  Also with COPD, CAD, hypertension with systolic and diastolic dysfunction (EF 45%).  She reports more exertional shortness of breath since our last visit. She has had some interval wt gain, has not really worked any on her conditioning.  Not currently on scheduled bronchodilator therapy.  Uses albuterol occasionally. She is due for repeat CT scan of the chest in September. Since  last time she was dx w mild OSA, CPAP was deferred.      Review of Systems  Constitutional:  Negative for fever and unexpected weight change.  HENT:  Negative for congestion, dental problem, ear pain, nosebleeds, postnasal drip, rhinorrhea, sinus pressure, sneezing, sore throat and trouble swallowing.   Eyes:  Negative for redness and itching.  Respiratory:  Positive for cough and shortness of breath. Negative for chest tightness and wheezing.   Cardiovascular:  Negative for palpitations and leg swelling.  Gastrointestinal:  Negative for nausea and vomiting.  Genitourinary:  Negative for dysuria.  Musculoskeletal:  Negative for joint swelling.  Skin:  Negative for rash.  Neurological:  Negative for headaches.  Hematological:  Does not bruise/bleed easily.  Psychiatric/Behavioral:  Negative for dysphoric mood. The patient is not nervous/anxious.        Objective:   Physical Exam Vitals:   01/19/23 1325  BP: 112/68  Pulse: (!) 106  Resp: 16  SpO2: 97%  Weight: 177 lb 9.6 oz (80.6 kg)  Height: 5\' 5"  (1.651 m)   Gen: Pleasant, well-nourished, in no distress,  normal affect  ENT: No lesions,  mouth clear,  oropharynx clear, no postnasal drip, no stridor  Lungs: No use of accessory muscles, no crackles, no active wheezing  Cardiovascular: RRR, heart sounds normal, no murmur or gallops, no peripheral edema  Musculoskeletal: No deformities, no cyanosis or clubbing  Neuro: alert, non focal  Skin: Warm, no lesions or rashes      Assessment & Plan:  COPD GOLD II B  Progressive dyspnea and decreasing exercise tolerance.  May be multifactorial, including some degree of deconditioning and weight gain.  She is following with cardiology and may have her Entresto dose adjusted in the near future.  I would like to try her on scheduled BD therapy to see if she gets benefit.  Will retry Stiolto   Levy Pupa, MD, PhD 01/19/2023, 1:55 PM [facility] [phone]  or if no answer [phone]

## 2023-01-19 NOTE — Addendum Note (Signed)
Addended by: Jama Flavors on: 01/19/2023 02:14 PM   Modules accepted: Orders

## 2023-01-20 NOTE — H&P (View-Only) (Signed)
Cardiology Office Note   Date:  01/21/2023   ID:  [name] [name], DOB 17-Jun-1955, MRN [id number]  PCP:  Alysia Penna, MD    No chief complaint on file.  Coronary artery calcification  Wt Readings from Last 3 Encounters:  01/21/23 176 lb 3.2 oz (79.9 kg)  01/19/23 177 lb 9.6 oz (80.6 kg)  10/11/22 177 lb 3.2 oz (80.4 kg)       History of Present Illness: [name] [name] is a 67 y.o. female  who was a patient of Dr. Verdis Prime.   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  EF is reduced.  We will try and institute a guideline directed regimen for preservation of LV function.  Low-dose Sherryll Burger has been started.  Because she complains of shortness of breath my thought was to add an SGLT2 although she objects to this.  Today we have decided to add carvedilol 3.125 mg p.o. twice daily.   He is not able to exercise because of plantar fasciitis.  Because she is unable to exercise her weight has gradually increased."    LVEF 40 to 45% in May 2022   2020 CT FFR: "CT FFR analysis shows mid and distal RCA =.74 to .76. suggesting moderate stenosis in RCA noted on CTA is significant and consistent with ischemia."   Ultimately, she did try Jardiance for 2 months but did not tolerate increased urination.    January 2024 by echocardiogram: "Left ventricular ejection fraction, by estimation, is 40 to 45%. The  left ventricle has mildly decreased function. The left ventricle  demonstrates global hypokinesis. Left ventricular diastolic parameters are  indeterminate.   2. Right ventricular systolic function is  normal. The right ventricular  size is normal. Tricuspid regurgitation signal is inadequate for assessing  PA pressure.   3. The mitral valve is normal in structure. Mild to moderate mitral valve  regurgitation. No evidence of mitral stenosis.   4. The aortic valve is grossly normal. Aortic valve regurgitation is not  visualized. No aortic stenosis is present.   5. The inferior vena cava is normal in size with <50% respiratory  variability, suggesting right atrial pressure of 8 mmHg.   Comparison(s): No significant change from prior study. "  Denies :  Dizziness. Leg edema. Nitroglycerin use. Orthopnea. Palpitations. Paroxysmal nocturnal dyspnea.  Syncope.    Has some chest heaviness and DOE.   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 she was given albuterol inhaler & had steroid with surg.     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   GERD (gastroesophageal reflux disease)    Hypertension  h/o PVC- asymptomatic    Insomnia 10/08/2021   Migraine equivalent 11/09/2012   atypical - loss of vision, transient, takes Verapimil for migraine & BP control    Mild obstructive sleep apnea 07/28/2021   Pain of right thumb 06/24/2017   Plantar fasciitis of left foot 03/12/2022   PONV (postoperative nausea and vomiting)    Primary cancer of right lower lobe of lung 04/17/2014   Shortness of breath 09/17/2019   Sprain of lateral ligament of ankle joint 09/11/2021   Subjective memory complaints 07/16/2022    Tenosynovitis of both hands 12/04/2021   Thyroid nodule    benign by biopsy   Tightness of left gastrocnemius muscle 03/12/2022   Transient visual loss 01/25/2012   Varicose veins    left leg   Vitamin D deficiency    Wears glasses     Past Surgical History:  Procedure Laterality Date   CARPAL TUNNEL RELEASE Left    CESAREAN SECTION     COLONOSCOPY W/ BIOPSIES AND POLYPECTOMY     DILATION AND CURETTAGE OF UTERUS     TONSILLECTOMY     VARICOSE VEIN SURGERY     left   VIDEO ASSISTED THORACOSCOPY (VATS)/ LOBECTOMY Right 07/23/2014   Procedure: VIDEO ASSISTED THORACOSCOPY (VATS)/ LOBECTOMY;  Surgeon: Kerin Perna, MD;  Location: [facility] OR;  Service: Thoracic;  Laterality: Right;   VIDEO BRONCHOSCOPY N/A 07/23/2014   Procedure: VIDEO BRONCHOSCOPY;  Surgeon: Kerin Perna, MD;  Location: [facility] OR;  Service: Thoracic;  Laterality: N/A;   VIDEO BRONCHOSCOPY WITH ENDOBRONCHIAL ULTRASOUND Right 04/17/2014   Procedure: VIDEO BRONCHOSCOPY WITH ENDOBRONCHIAL ULTRASOUND;  Surgeon: Leslye Peer, MD;  Location: [facility] OR;  Service: Thoracic;  Laterality: Right;   VIDEO BRONCHOSCOPY WITH ENDOBRONCHIAL ULTRASOUND N/A 07/08/2014   Procedure: VIDEO BRONCHOSCOPY WITH ENDOBRONCHIAL ULTRASOUND;  Surgeon: Kerin Perna, MD;  Location: [facility] OR;  Service: Thoracic;  Laterality: N/A;     Current Outpatient Medications  Medication Sig Dispense Refill   aspirin EC 81 MG tablet Take 1 tablet (81 mg total) by mouth daily. Swallow whole. 30 tablet 12   loratadine (CLARITIN) 10 MG tablet Take 10 mg by mouth daily as needed.     nitroGLYCERIN (NITROSTAT) 0.4 MG SL tablet Place 1 tablet (0.4 mg total) under the tongue every 5 (five) minutes as needed for chest pain. 25 tablet 6   rosuvastatin (CRESTOR) 10 MG tablet Take 1 tablet (10 mg total) by mouth daily. 90 tablet 3   sacubitril-valsartan (ENTRESTO) 49-51 MG Take 1 tablet by mouth 2 (two) times daily. 60 tablet 6   Suvorexant (BELSOMRA) 10 MG TABS Take 1 tablet (10  mg total) by mouth at bedtime as needed. 30 tablet 2   Tiotropium Bromide-Olodaterol (STIOLTO RESPIMAT) 2.5-2.5 MCG/ACT AERS Inhale 2 puffs into the lungs daily.     VENTOLIN HFA 108 (90 Base) MCG/ACT inhaler INHALE 1 PUFF BY MOUTH THREE TIMES DAILY AS NEEDED FOR WHEEZING FOR SHORTNESS OF BREATH 18 g 0   Vitamin D, Ergocalciferol, (DRISDOL) 50000 units CAPS capsule Take 50,000 Units by mouth every 7 (seven) days.     Current Facility-Administered Medications  Medication Dose Route Frequency Provider Last Rate Last Admin   Ampicillin-Sulbactam (UNASYN) 3 g in sodium chloride 0.9 % 100 mL IVPB  3 g Intravenous Once Karie Chimera, PA-C        Allergies:   Advil [ibuprofen], Amoxicillin-pot clavulanate, Carvedilol, Codeine, Hydrocodone-guaifenesin, Sulfa antibiotics, Temazepam, Plasticized base [plastibase], and Zofran [ondansetron hcl]    Social  History:  The patient  reports that she quit smoking about 9 years ago. Her smoking use included cigarettes. She started smoking about 49 years ago. She has a 20 pack-year smoking history. She has never used smokeless tobacco. She reports that she does not currently use alcohol. She reports that she does not use drugs.   Family History:  The patient's family history includes Atrial fibrillation in her father; Cancer in her mother; Diverticulitis in her brother and father; Headache in her sister; Hypertension in her brother, father, sister, and sister.    ROS:  Please see the history of present illness.   Otherwise, review of systems are positive for persistent SHOB.   All other systems are reviewed and negative.    PHYSICAL EXAM: VS:  BP 130/86   Pulse 92   Ht 5' 5.5" (1.664 m)   Wt 176 lb 3.2 oz (79.9 kg)   SpO2 96%   BMI 28.88 kg/m  , BMI Body mass index is 28.88 kg/m. GEN: Well nourished, well developed, in no acute distress HEENT: normal Neck: no JVD, carotid bruits, or masses Cardiac: RRR; no murmurs, rubs, or gallops,no edema   Respiratory:  clear to auscultation bilaterally, normal work of breathing GI: soft, nontender, nondistended, + BS MS: no deformity or atrophy Skin: warm and dry, no rash Neuro:  Strength and sensation are intact Psych: euthymic mood, full affect   EKG:   The ekg ordered today demonstrates NSR, PVC   Recent Labs: 02/15/2022: ALT 11; Hemoglobin 14.5; Platelet Count 270 07/23/2022: BUN 12; Creatinine, Ser 0.73; Potassium 3.7; Sodium 147   Lipid Panel    Component Value Date/Time   CHOL 127 11/14/2020 1029   TRIG 46 11/14/2020 1029   HDL 68 11/14/2020 1029   CHOLHDL 1.9 11/14/2020 1029   LDLCALC 48 11/14/2020 1029     Other studies Reviewed: Additional studies/ records that were reviewed today with results demonstrating: Normal renal function in February 2024, LDL 63 in June 2023.   ASSESSMENT AND PLAN:  Coronary artery calcification: Calcium score of 209 in 2020 with moderate RCA disease.  Given persistent shortness of breath, will plan for right and left heart catheterization. DOE: persistent.  Emphysema history.  Working with pulmonary on inhalers. Chronic systolic heart failure: Appears euvolemic.  Continue Entresto.  She has been intolerant of SGLT2 inhibitor and beta-blockers. HTN: The current medical regimen is effective;  continue present plan and medications.  Tolerating higher dose of Entresto. PVCs: No sx.  The patient understands that risks include but are not limited to stroke (1 in 1000), death (1 in 1000), kidney failure [usually temporary] (1 in 500), bleeding (1 in 200), allergic reaction [possibly serious] (1 in 200), and agrees to proceed.     Current medicines are reviewed at length with the patient today.  The patient concerns regarding her medicines were addressed.  The following changes have been made:  No change  Labs/ tests ordered today include:   Orders Placed This Encounter  Procedures   EKG 12-Lead    Recommend 150 minutes/week of aerobic  exercise Low fat, low carb, high fiber diet recommended  Disposition:   FU based on cath results   Signed, Lance Muss, MD  01/21/2023 4:49 PM    [facility] [address] [place], [place], [place]  [address] Phone: [phone]; Fax: [phone]

## 2023-01-20 NOTE — Progress Notes (Signed)
Cardiology Office Note   Date:  01/21/2023   ID:  [name] [name], DOB 17-Jun-1955, MRN [id number]  PCP:  Alysia Penna, MD    No chief complaint on file.  Coronary artery calcification  Wt Readings from Last 3 Encounters:  01/21/23 176 lb 3.2 oz (79.9 kg)  01/19/23 177 lb 9.6 oz (80.6 kg)  10/11/22 177 lb 3.2 oz (80.4 kg)       History of Present Illness: [name] [name] is a 67 y.o. female  who was a patient of Dr. Verdis Prime.   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  EF is reduced.  We will try and institute a guideline directed regimen for preservation of LV function.  Low-dose Sherryll Burger has been started.  Because she complains of shortness of breath my thought was to add an SGLT2 although she objects to this.  Today we have decided to add carvedilol 3.125 mg p.o. twice daily.   He is not able to exercise because of plantar fasciitis.  Because she is unable to exercise her weight has gradually increased."    LVEF 40 to 45% in May 2022   2020 CT FFR: "CT FFR analysis shows mid and distal RCA =.74 to .76. suggesting moderate stenosis in RCA noted on CTA is significant and consistent with ischemia."   Ultimately, she did try Jardiance for 2 months but did not tolerate increased urination.    January 2024 by echocardiogram: "Left ventricular ejection fraction, by estimation, is 40 to 45%. The  left ventricle has mildly decreased function. The left ventricle  demonstrates global hypokinesis. Left ventricular diastolic parameters are  indeterminate.   2. Right ventricular systolic function is  normal. The right ventricular  size is normal. Tricuspid regurgitation signal is inadequate for assessing  PA pressure.   3. The mitral valve is normal in structure. Mild to moderate mitral valve  regurgitation. No evidence of mitral stenosis.   4. The aortic valve is grossly normal. Aortic valve regurgitation is not  visualized. No aortic stenosis is present.   5. The inferior vena cava is normal in size with <50% respiratory  variability, suggesting right atrial pressure of 8 mmHg.   Comparison(s): No significant change from prior study. "  Denies :  Dizziness. Leg edema. Nitroglycerin use. Orthopnea. Palpitations. Paroxysmal nocturnal dyspnea.  Syncope.    Has some chest heaviness and DOE.   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 she was given albuterol inhaler & had steroid with surg.     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   GERD (gastroesophageal reflux disease)    Hypertension  h/o PVC- asymptomatic    Insomnia 10/08/2021   Migraine equivalent 11/09/2012   atypical - loss of vision, transient, takes Verapimil for migraine & BP control    Mild obstructive sleep apnea 07/28/2021   Pain of right thumb 06/24/2017   Plantar fasciitis of left foot 03/12/2022   PONV (postoperative nausea and vomiting)    Primary cancer of right lower lobe of lung 04/17/2014   Shortness of breath 09/17/2019   Sprain of lateral ligament of ankle joint 09/11/2021   Subjective memory complaints 07/16/2022    Tenosynovitis of both hands 12/04/2021   Thyroid nodule    benign by biopsy   Tightness of left gastrocnemius muscle 03/12/2022   Transient visual loss 01/25/2012   Varicose veins    left leg   Vitamin D deficiency    Wears glasses     Past Surgical History:  Procedure Laterality Date   CARPAL TUNNEL RELEASE Left    CESAREAN SECTION     COLONOSCOPY W/ BIOPSIES AND POLYPECTOMY     DILATION AND CURETTAGE OF UTERUS     TONSILLECTOMY     VARICOSE VEIN SURGERY     left   VIDEO ASSISTED THORACOSCOPY (VATS)/ LOBECTOMY Right 07/23/2014   Procedure: VIDEO ASSISTED THORACOSCOPY (VATS)/ LOBECTOMY;  Surgeon: Kerin Perna, MD;  Location: [facility] OR;  Service: Thoracic;  Laterality: Right;   VIDEO BRONCHOSCOPY N/A 07/23/2014   Procedure: VIDEO BRONCHOSCOPY;  Surgeon: Kerin Perna, MD;  Location: [facility] OR;  Service: Thoracic;  Laterality: N/A;   VIDEO BRONCHOSCOPY WITH ENDOBRONCHIAL ULTRASOUND Right 04/17/2014   Procedure: VIDEO BRONCHOSCOPY WITH ENDOBRONCHIAL ULTRASOUND;  Surgeon: Leslye Peer, MD;  Location: [facility] OR;  Service: Thoracic;  Laterality: Right;   VIDEO BRONCHOSCOPY WITH ENDOBRONCHIAL ULTRASOUND N/A 07/08/2014   Procedure: VIDEO BRONCHOSCOPY WITH ENDOBRONCHIAL ULTRASOUND;  Surgeon: Kerin Perna, MD;  Location: [facility] OR;  Service: Thoracic;  Laterality: N/A;     Current Outpatient Medications  Medication Sig Dispense Refill   aspirin EC 81 MG tablet Take 1 tablet (81 mg total) by mouth daily. Swallow whole. 30 tablet 12   loratadine (CLARITIN) 10 MG tablet Take 10 mg by mouth daily as needed.     nitroGLYCERIN (NITROSTAT) 0.4 MG SL tablet Place 1 tablet (0.4 mg total) under the tongue every 5 (five) minutes as needed for chest pain. 25 tablet 6   rosuvastatin (CRESTOR) 10 MG tablet Take 1 tablet (10 mg total) by mouth daily. 90 tablet 3   sacubitril-valsartan (ENTRESTO) 49-51 MG Take 1 tablet by mouth 2 (two) times daily. 60 tablet 6   Suvorexant (BELSOMRA) 10 MG TABS Take 1 tablet (10  mg total) by mouth at bedtime as needed. 30 tablet 2   Tiotropium Bromide-Olodaterol (STIOLTO RESPIMAT) 2.5-2.5 MCG/ACT AERS Inhale 2 puffs into the lungs daily.     VENTOLIN HFA 108 (90 Base) MCG/ACT inhaler INHALE 1 PUFF BY MOUTH THREE TIMES DAILY AS NEEDED FOR WHEEZING FOR SHORTNESS OF BREATH 18 g 0   Vitamin D, Ergocalciferol, (DRISDOL) 50000 units CAPS capsule Take 50,000 Units by mouth every 7 (seven) days.     Current Facility-Administered Medications  Medication Dose Route Frequency Provider Last Rate Last Admin   Ampicillin-Sulbactam (UNASYN) 3 g in sodium chloride 0.9 % 100 mL IVPB  3 g Intravenous Once Karie Chimera, PA-C        Allergies:   Advil [ibuprofen], Amoxicillin-pot clavulanate, Carvedilol, Codeine, Hydrocodone-guaifenesin, Sulfa antibiotics, Temazepam, Plasticized base [plastibase], and Zofran [ondansetron hcl]    Social  History:  The patient  reports that she quit smoking about 9 years ago. Her smoking use included cigarettes. She started smoking about 49 years ago. She has a 20 pack-year smoking history. She has never used smokeless tobacco. She reports that she does not currently use alcohol. She reports that she does not use drugs.   Family History:  The patient's family history includes Atrial fibrillation in her father; Cancer in her mother; Diverticulitis in her brother and father; Headache in her sister; Hypertension in her brother, father, sister, and sister.    ROS:  Please see the history of present illness.   Otherwise, review of systems are positive for persistent SHOB.   All other systems are reviewed and negative.    PHYSICAL EXAM: VS:  BP 130/86   Pulse 92   Ht 5' 5.5" (1.664 m)   Wt 176 lb 3.2 oz (79.9 kg)   SpO2 96%   BMI 28.88 kg/m  , BMI Body mass index is 28.88 kg/m. GEN: Well nourished, well developed, in no acute distress HEENT: normal Neck: no JVD, carotid bruits, or masses Cardiac: RRR; no murmurs, rubs, or gallops,no edema   Respiratory:  clear to auscultation bilaterally, normal work of breathing GI: soft, nontender, nondistended, + BS MS: no deformity or atrophy Skin: warm and dry, no rash Neuro:  Strength and sensation are intact Psych: euthymic mood, full affect   EKG:   The ekg ordered today demonstrates NSR, PVC   Recent Labs: 02/15/2022: ALT 11; Hemoglobin 14.5; Platelet Count 270 07/23/2022: BUN 12; Creatinine, Ser 0.73; Potassium 3.7; Sodium 147   Lipid Panel    Component Value Date/Time   CHOL 127 11/14/2020 1029   TRIG 46 11/14/2020 1029   HDL 68 11/14/2020 1029   CHOLHDL 1.9 11/14/2020 1029   LDLCALC 48 11/14/2020 1029     Other studies Reviewed: Additional studies/ records that were reviewed today with results demonstrating: Normal renal function in February 2024, LDL 63 in June 2023.   ASSESSMENT AND PLAN:  Coronary artery calcification: Calcium score of 209 in 2020 with moderate RCA disease.  Given persistent shortness of breath, will plan for right and left heart catheterization. DOE: persistent.  Emphysema history.  Working with pulmonary on inhalers. Chronic systolic heart failure: Appears euvolemic.  Continue Entresto.  She has been intolerant of SGLT2 inhibitor and beta-blockers. HTN: The current medical regimen is effective;  continue present plan and medications.  Tolerating higher dose of Entresto. PVCs: No sx.  The patient understands that risks include but are not limited to stroke (1 in 1000), death (1 in 1000), kidney failure [usually temporary] (1 in 500), bleeding (1 in 200), allergic reaction [possibly serious] (1 in 200), and agrees to proceed.     Current medicines are reviewed at length with the patient today.  The patient concerns regarding her medicines were addressed.  The following changes have been made:  No change  Labs/ tests ordered today include:   Orders Placed This Encounter  Procedures   EKG 12-Lead    Recommend 150 minutes/week of aerobic  exercise Low fat, low carb, high fiber diet recommended  Disposition:   FU based on cath results   Signed, Lance Muss, MD  01/21/2023 4:49 PM    [facility] [address] [place], [place], [place]  [address] Phone: [phone]; Fax: [phone]

## 2023-01-21 ENCOUNTER — Ambulatory Visit: Payer: Medicare Other | Attending: Interventional Cardiology | Admitting: Interventional Cardiology

## 2023-01-21 ENCOUNTER — Encounter: Payer: Self-pay | Admitting: Interventional Cardiology

## 2023-01-21 VITALS — BP 130/86 | HR 92 | Ht 65.5 in | Wt 176.2 lb

## 2023-01-21 DIAGNOSIS — I10 Essential (primary) hypertension: Secondary | ICD-10-CM | POA: Diagnosis not present

## 2023-01-21 DIAGNOSIS — I493 Ventricular premature depolarization: Secondary | ICD-10-CM | POA: Diagnosis not present

## 2023-01-21 DIAGNOSIS — E785 Hyperlipidemia, unspecified: Secondary | ICD-10-CM | POA: Insufficient documentation

## 2023-01-21 DIAGNOSIS — I2584 Coronary atherosclerosis due to calcified coronary lesion: Secondary | ICD-10-CM | POA: Diagnosis not present

## 2023-01-21 DIAGNOSIS — I5042 Chronic combined systolic (congestive) and diastolic (congestive) heart failure: Secondary | ICD-10-CM | POA: Insufficient documentation

## 2023-01-21 DIAGNOSIS — Z87891 Personal history of nicotine dependence: Secondary | ICD-10-CM | POA: Diagnosis not present

## 2023-01-21 DIAGNOSIS — I2510 Atherosclerotic heart disease of native coronary artery without angina pectoris: Secondary | ICD-10-CM | POA: Insufficient documentation

## 2023-01-21 MED ORDER — NITROGLYCERIN 0.4 MG SL SUBL: 0.4000 mg | SUBLINGUAL_TABLET | SUBLINGUAL | 6 refills | Status: DC | PRN

## 2023-01-21 NOTE — Patient Instructions (Signed)
Medication Instructions:  Your physician recommends that you continue on your current medications as directed. Please refer to the Current Medication list given to you today.  *If you need a refill on your cardiac medications before your next appointment, please call your pharmacy*   Lab Work: None today-We will get the lab work from your primary care provider If you have labs (blood work) drawn today and your tests are completely normal, you will receive your results only by: MyChart Message (if you have MyChart) OR A paper copy in the mail If you have any lab test that is abnormal or we need to change your treatment, we will call you to review the results.   Testing/Procedures: Your physician has requested that you have a cardiac catheterization. Cardiac catheterization is used to diagnose and/or treat various heart conditions. Doctors may recommend this procedure for a number of different reasons. The most common reason is to evaluate chest pain. Chest pain can be a symptom of coronary artery disease (CAD), and cardiac catheterization can show whether plaque is narrowing or blocking your heart's arteries. This procedure is also used to evaluate the valves, as well as measure the blood flow and oxygen levels in different parts of your heart. For further information please visit [email or web address] Please follow instruction sheet, as given. Scheduled for January 26, 2023   Follow-Up: At [facility], you and your health needs are our priority.  As part of our continuing mission to provide you with exceptional heart care, we have created designated Provider Care Teams.  These Care Teams include your primary Cardiologist (physician) and Advanced Practice Providers (APPs -  Physician Assistants and Nurse Practitioners) who all work together to provide you with the care you need, when you need it.  We recommend signing up for the patient portal called "MyChart".  Sign up information is  provided on this After Visit Summary.  MyChart is used to connect with patients for Virtual Visits (Telemedicine).  Patients are able to view lab/test results, encounter notes, upcoming appointments, etc.  Non-urgent messages can be sent to your provider as well.   To learn more about what you can do with MyChart, go to [email or web address].    Your next appointment:   To be arranged after procedure  Provider:   Lance Muss, MD     Other Instructions   [facility] A DEPT OF [place]. [facility] AT [facility] [address] [place], [place] [address] [place] [address] Dept: [phone] Loc: [phone]  [name] [name]  01/21/2023  You are scheduled for a Cardiac Catheterization on Wednesday, August 21 with Dr. Lance Muss.  1. Please arrive at the [facility] (Main Entrance A) at [facility]: [address] [place], [place] [address] at 6:30 AM (This time is 2 hour(s) before your procedure to ensure your preparation). Free valet parking service is available. You will check in at ADMITTING. The support person will be asked to wait in the waiting room.  It is OK to have someone drop you off and come back when you are ready to be discharged.    Special note: Every effort is made to have your procedure done on time. Please understand that emergencies sometimes delay scheduled procedures.  2. Diet: Do not eat solid foods after midnight.  The patient may have clear liquids until 5am upon the day of the procedure.  3. Labs: We will contact your primary care provider to get recent lab results  4. Medication instructions in preparation for your procedure:   Contrast Allergy: No    On the morning of your procedure, take your Aspirin 81 mg and any morning medicines NOT listed above.  You may use sips of water.  5. Plan to go home the same day, you will only stay overnight if medically necessary. 6. Bring a current list of  your medications and current insurance cards. 7. You MUST have a responsible person to drive you home. 8. Someone MUST be with you the first 24 hours after you arrive home or your discharge will be delayed. 9. Please wear clothes that are easy to get on and off and wear slip-on shoes.  Thank you for allowing [place] to care for you!   -- [place] [facility]

## 2023-01-24 ENCOUNTER — Encounter: Payer: Self-pay | Admitting: Internal Medicine

## 2023-01-24 ENCOUNTER — Telehealth: Payer: Self-pay | Admitting: Interventional Cardiology

## 2023-01-24 DIAGNOSIS — Z1231 Encounter for screening mammogram for malignant neoplasm of breast: Secondary | ICD-10-CM | POA: Diagnosis not present

## 2023-01-24 NOTE — Telephone Encounter (Signed)
I spoke with patient and answered her questions.

## 2023-01-24 NOTE — Telephone Encounter (Signed)
Patient calling with the question concerning her procedure on Wednesday. Wanting to know how long the procedure take and when will she be able to go home. And what to expect pre and post opp surgery. Will she awake during procedure. Please advise

## 2023-01-25 ENCOUNTER — Ambulatory Visit: Payer: Medicare Other | Admitting: Podiatry

## 2023-01-25 ENCOUNTER — Telehealth: Payer: Self-pay | Admitting: *Deleted

## 2023-01-25 DIAGNOSIS — Z0000 Encounter for general adult medical examination without abnormal findings: Secondary | ICD-10-CM | POA: Diagnosis not present

## 2023-01-25 DIAGNOSIS — F330 Major depressive disorder, recurrent, mild: Secondary | ICD-10-CM | POA: Diagnosis not present

## 2023-01-25 DIAGNOSIS — I110 Hypertensive heart disease with heart failure: Secondary | ICD-10-CM | POA: Diagnosis not present

## 2023-01-25 DIAGNOSIS — Z1331 Encounter for screening for depression: Secondary | ICD-10-CM | POA: Diagnosis not present

## 2023-01-25 DIAGNOSIS — C3490 Malignant neoplasm of unspecified part of unspecified bronchus or lung: Secondary | ICD-10-CM | POA: Diagnosis not present

## 2023-01-25 DIAGNOSIS — E785 Hyperlipidemia, unspecified: Secondary | ICD-10-CM | POA: Diagnosis not present

## 2023-01-25 DIAGNOSIS — L03032 Cellulitis of left toe: Secondary | ICD-10-CM | POA: Diagnosis not present

## 2023-01-25 DIAGNOSIS — I700 Atherosclerosis of aorta: Secondary | ICD-10-CM | POA: Diagnosis not present

## 2023-01-25 DIAGNOSIS — J449 Chronic obstructive pulmonary disease, unspecified: Secondary | ICD-10-CM | POA: Diagnosis not present

## 2023-01-25 DIAGNOSIS — L600 Ingrowing nail: Secondary | ICD-10-CM

## 2023-01-25 DIAGNOSIS — I5020 Unspecified systolic (congestive) heart failure: Secondary | ICD-10-CM | POA: Diagnosis not present

## 2023-01-25 DIAGNOSIS — G4700 Insomnia, unspecified: Secondary | ICD-10-CM | POA: Diagnosis not present

## 2023-01-25 DIAGNOSIS — I2510 Atherosclerotic heart disease of native coronary artery without angina pectoris: Secondary | ICD-10-CM | POA: Diagnosis not present

## 2023-01-25 DIAGNOSIS — I10 Essential (primary) hypertension: Secondary | ICD-10-CM | POA: Diagnosis not present

## 2023-01-25 DIAGNOSIS — Z1339 Encounter for screening examination for other mental health and behavioral disorders: Secondary | ICD-10-CM | POA: Diagnosis not present

## 2023-01-25 DIAGNOSIS — R82998 Other abnormal findings in urine: Secondary | ICD-10-CM | POA: Diagnosis not present

## 2023-01-25 MED ORDER — DOXYCYCLINE HYCLATE 100 MG PO TABS: 100.0000 mg | ORAL_TABLET | Freq: Two times a day (BID) | ORAL | 0 refills | Status: DC

## 2023-01-25 NOTE — Telephone Encounter (Signed)
Patient advised to go ahead and start doxycycline now before cath.

## 2023-01-25 NOTE — Telephone Encounter (Signed)
Cardiac Catheterization scheduled at [facility] for: Wednesday January 26, 2023 8:30 AM Arrival time [facility] Main Entrance A at: 6:30 AM  Nothing to eat after midnight prior to procedure, clear liquids until 5 AM day of procedure.  Medication instructions: -Usual morning medications can be taken with sips of water including aspirin 81 mg.  Patient advised okay to go ahead and start doxycycline ordered by podiatrist.  Plan to go home the same day, you will only stay overnight if medically necessary.  You must have responsible adult to drive you home.  Someone must be with you the first 24 hours after you arrive home.  Reviewed procedure instructions with patient.

## 2023-01-25 NOTE — Telephone Encounter (Signed)
Pt calling back about her procedure tomorrow. She states she saw her podiatrist today and they told her she has an infection starting in her toe. She states they want her to start on doxycycline 100mg  twice daily. She doesn't have a fever or any drainage. She wants to know if she should start now or after cath.

## 2023-01-25 NOTE — Patient Instructions (Signed)

## 2023-01-26 ENCOUNTER — Other Ambulatory Visit (HOSPITAL_COMMUNITY): Payer: Self-pay

## 2023-01-26 ENCOUNTER — Other Ambulatory Visit: Payer: Self-pay

## 2023-01-26 ENCOUNTER — Ambulatory Visit (HOSPITAL_COMMUNITY)
Admission: RE | Admit: 2023-01-26 | Discharge: 2023-01-26 | Disposition: A | Discharge status: Home / Self Care | Payer: Medicare Other | Attending: Interventional Cardiology | Admitting: Interventional Cardiology

## 2023-01-26 ENCOUNTER — Encounter (HOSPITAL_COMMUNITY)
Admission: RE | Disposition: A | Discharge status: Home / Self Care | Payer: Self-pay | Source: Home / Self Care | Attending: Interventional Cardiology

## 2023-01-26 DIAGNOSIS — E785 Hyperlipidemia, unspecified: Secondary | ICD-10-CM | POA: Diagnosis not present

## 2023-01-26 DIAGNOSIS — Z79899 Other long term (current) drug therapy: Secondary | ICD-10-CM | POA: Diagnosis not present

## 2023-01-26 DIAGNOSIS — I25118 Atherosclerotic heart disease of native coronary artery with other forms of angina pectoris: Secondary | ICD-10-CM | POA: Diagnosis not present

## 2023-01-26 DIAGNOSIS — Z85118 Personal history of other malignant neoplasm of bronchus and lung: Secondary | ICD-10-CM | POA: Insufficient documentation

## 2023-01-26 DIAGNOSIS — Z7982 Long term (current) use of aspirin: Secondary | ICD-10-CM | POA: Insufficient documentation

## 2023-01-26 DIAGNOSIS — J439 Emphysema, unspecified: Secondary | ICD-10-CM | POA: Insufficient documentation

## 2023-01-26 DIAGNOSIS — Z87891 Personal history of nicotine dependence: Secondary | ICD-10-CM | POA: Insufficient documentation

## 2023-01-26 DIAGNOSIS — I2510 Atherosclerotic heart disease of native coronary artery without angina pectoris: Secondary | ICD-10-CM | POA: Diagnosis present

## 2023-01-26 DIAGNOSIS — I110 Hypertensive heart disease with heart failure: Secondary | ICD-10-CM | POA: Insufficient documentation

## 2023-01-26 DIAGNOSIS — I2721 Secondary pulmonary arterial hypertension: Secondary | ICD-10-CM | POA: Insufficient documentation

## 2023-01-26 DIAGNOSIS — R0600 Dyspnea, unspecified: Secondary | ICD-10-CM | POA: Diagnosis present

## 2023-01-26 DIAGNOSIS — R0609 Other forms of dyspnea: Secondary | ICD-10-CM | POA: Diagnosis not present

## 2023-01-26 DIAGNOSIS — I5042 Chronic combined systolic (congestive) and diastolic (congestive) heart failure: Secondary | ICD-10-CM | POA: Insufficient documentation

## 2023-01-26 DIAGNOSIS — Z955 Presence of coronary angioplasty implant and graft: Secondary | ICD-10-CM | POA: Diagnosis not present

## 2023-01-26 DIAGNOSIS — Z7902 Long term (current) use of antithrombotics/antiplatelets: Secondary | ICD-10-CM | POA: Insufficient documentation

## 2023-01-26 DIAGNOSIS — I10 Essential (primary) hypertension: Secondary | ICD-10-CM | POA: Diagnosis present

## 2023-01-26 HISTORY — PX: CORONARY STENT INTERVENTION: CATH118234

## 2023-01-26 HISTORY — PX: RIGHT/LEFT HEART CATH AND CORONARY ANGIOGRAPHY: CATH118266

## 2023-01-26 LAB — POCT I-STAT EG7
Acid-base deficit: 2 mmol/L (ref 0.0–2.0)
Acid-base deficit: 2 mmol/L (ref 0.0–2.0)
Bicarbonate: 23.5 mmol/L (ref 20.0–28.0)
Bicarbonate: 23.8 mmol/L (ref 20.0–28.0)
Calcium, Ion: 1.23 mmol/L (ref 1.15–1.40)
Calcium, Ion: 1.23 mmol/L (ref 1.15–1.40)
HCT: 40 % (ref 36.0–46.0)
HCT: 41 % (ref 36.0–46.0)
Hemoglobin: 13.6 g/dL (ref 12.0–15.0)
Hemoglobin: 13.9 g/dL (ref 12.0–15.0)
O2 Saturation: 72 %
O2 Saturation: 75 %
Potassium: 3.4 mmol/L — ABNORMAL LOW (ref 3.5–5.1)
Potassium: 3.4 mmol/L — ABNORMAL LOW (ref 3.5–5.1)
Sodium: 142 mmol/L (ref 135–145)
Sodium: 142 mmol/L (ref 135–145)
TCO2: 25 mmol/L (ref 22–32)
TCO2: 25 mmol/L (ref 22–32)
pCO2, Ven: 43.7 mmHg — ABNORMAL LOW (ref 44–60)
pCO2, Ven: 43.6 mmHg — ABNORMAL LOW (ref 44–60)
pH, Ven: 7.339 (ref 7.25–7.43)
pH, Ven: 7.345 (ref 7.25–7.43)
pO2, Ven: 40 mmHg (ref 32–45)
pO2, Ven: 42 mmHg (ref 32–45)

## 2023-01-26 LAB — POCT I-STAT 7, (LYTES, BLD GAS, ICA,H+H)
Acid-base deficit: 3 mmol/L — ABNORMAL HIGH (ref 0.0–2.0)
Bicarbonate: 22.7 mmol/L (ref 20.0–28.0)
Calcium, Ion: 1.23 mmol/L (ref 1.15–1.40)
HCT: 41 % (ref 36.0–46.0)
Hemoglobin: 13.9 g/dL (ref 12.0–15.0)
O2 Saturation: 91 %
Potassium: 3.4 mmol/L — ABNORMAL LOW (ref 3.5–5.1)
Sodium: 142 mmol/L (ref 135–145)
TCO2: 24 mmol/L (ref 22–32)
pCO2 arterial: 41 mmHg (ref 32–48)
pH, Arterial: 7.351 (ref 7.35–7.45)
pO2, Arterial: 65 mmHg — ABNORMAL LOW (ref 83–108)

## 2023-01-26 LAB — POCT ACTIVATED CLOTTING TIME: Activated Clotting Time: 354 seconds

## 2023-01-26 SURGERY — RIGHT/LEFT HEART CATH AND CORONARY ANGIOGRAPHY
Anesthesia: LOCAL

## 2023-01-26 MED ORDER — VERAPAMIL HCL 2.5 MG/ML IV SOLN
INTRAVENOUS | Status: DC | PRN
  Administered 2023-01-26 (×2): 10 mL via INTRA_ARTERIAL

## 2023-01-26 MED ORDER — CLOPIDOGREL BISULFATE 75 MG PO TABS: 75.0000 mg | ORAL_TABLET | Freq: Every day | ORAL | Status: DC

## 2023-01-26 MED ORDER — ROSUVASTATIN CALCIUM 20 MG PO TABS: 20.0000 mg | ORAL_TABLET | Freq: Every day | ORAL | Status: DC

## 2023-01-26 MED ORDER — SODIUM CHLORIDE 0.9% FLUSH: 3.0000 mL | INTRAVENOUS | Status: DC | PRN

## 2023-01-26 MED ORDER — FENTANYL CITRATE (PF) 100 MCG/2ML IJ SOLN
INTRAMUSCULAR | Status: AC
  Filled 2023-01-26: qty 2

## 2023-01-26 MED ORDER — CLOPIDOGREL BISULFATE 300 MG PO TABS
ORAL_TABLET | ORAL | Status: AC
  Filled 2023-01-26: qty 2

## 2023-01-26 MED ORDER — ASPIRIN 81 MG PO CHEW: 81.0000 mg | CHEWABLE_TABLET | Freq: Every day | ORAL | Status: DC

## 2023-01-26 MED ORDER — CLOPIDOGREL BISULFATE 75 MG PO TABS
75.0000 mg | ORAL_TABLET | Freq: Every day | ORAL | 1 refills | Status: DC
  Filled 2023-01-26: qty 90, 90d supply, fill #0

## 2023-01-26 MED ORDER — SODIUM CHLORIDE 0.9 % WEIGHT BASED INFUSION
3.0000 mL/kg/h | INTRAVENOUS | Status: AC
  Administered 2023-01-26: 3 mL/kg/h via INTRAVENOUS

## 2023-01-26 MED ORDER — ONDANSETRON HCL 4 MG/2ML IJ SOLN: 4.0000 mg | Freq: Four times a day (QID) | INTRAMUSCULAR | Status: DC | PRN

## 2023-01-26 MED ORDER — SODIUM CHLORIDE 0.9 % IV SOLN: 250.0000 mL | INTRAVENOUS | Status: DC | PRN

## 2023-01-26 MED ORDER — LIDOCAINE HCL (PF) 1 % IJ SOLN
INTRAMUSCULAR | Status: AC
  Filled 2023-01-26: qty 30

## 2023-01-26 MED ORDER — LIDOCAINE HCL (PF) 1 % IJ SOLN
INTRAMUSCULAR | Status: DC | PRN
  Administered 2023-01-26: 6 mL

## 2023-01-26 MED ORDER — ASPIRIN 81 MG PO CHEW: 81.0000 mg | CHEWABLE_TABLET | ORAL | Status: DC

## 2023-01-26 MED ORDER — CLOPIDOGREL BISULFATE 300 MG PO TABS
ORAL_TABLET | ORAL | Status: DC | PRN
  Administered 2023-01-26: 600 mg via ORAL

## 2023-01-26 MED ORDER — MIDAZOLAM HCL 2 MG/2ML IJ SOLN
INTRAMUSCULAR | Status: AC
  Filled 2023-01-26: qty 2

## 2023-01-26 MED ORDER — MIDAZOLAM HCL 2 MG/2ML IJ SOLN
INTRAMUSCULAR | Status: DC | PRN
  Administered 2023-01-26: 1 mg via INTRAVENOUS
  Administered 2023-01-26: 2 mg via INTRAVENOUS
  Administered 2023-01-26: 1 mg via INTRAVENOUS

## 2023-01-26 MED ORDER — NITROGLYCERIN 1 MG/10 ML FOR IR/CATH LAB
INTRA_ARTERIAL | Status: AC
  Filled 2023-01-26: qty 10

## 2023-01-26 MED ORDER — VERAPAMIL HCL 2.5 MG/ML IV SOLN
INTRAVENOUS | Status: AC
  Filled 2023-01-26: qty 2

## 2023-01-26 MED ORDER — HEPARIN (PORCINE) IN NACL 1000-0.9 UT/500ML-% IV SOLN
INTRAVENOUS | Status: DC | PRN
  Administered 2023-01-26 (×2): 500 mL

## 2023-01-26 MED ORDER — HEPARIN SODIUM (PORCINE) 1000 UNIT/ML IJ SOLN
INTRAMUSCULAR | Status: AC
  Filled 2023-01-26: qty 10

## 2023-01-26 MED ORDER — HEPARIN SODIUM (PORCINE) 1000 UNIT/ML IJ SOLN
INTRAMUSCULAR | Status: DC | PRN
  Administered 2023-01-26: 6000 [IU] via INTRAVENOUS
  Administered 2023-01-26: 4000 [IU] via INTRAVENOUS

## 2023-01-26 MED ORDER — NITROGLYCERIN 1 MG/10 ML FOR IR/CATH LAB
INTRA_ARTERIAL | Status: DC | PRN
  Administered 2023-01-26: 200 ug via INTRACORONARY

## 2023-01-26 MED ORDER — LABETALOL HCL 5 MG/ML IV SOLN: 10.0000 mg | INTRAVENOUS | Status: AC | PRN

## 2023-01-26 MED ORDER — SODIUM CHLORIDE 0.9 % IV SOLN: INTRAVENOUS | Status: AC

## 2023-01-26 MED ORDER — FENTANYL CITRATE (PF) 100 MCG/2ML IJ SOLN
INTRAMUSCULAR | Status: DC | PRN
  Administered 2023-01-26 (×3): 25 ug via INTRAVENOUS

## 2023-01-26 MED ORDER — IOHEXOL 350 MG/ML SOLN
INTRAVENOUS | Status: DC | PRN
  Administered 2023-01-26: 88 mL

## 2023-01-26 MED ORDER — HYDRALAZINE HCL 20 MG/ML IJ SOLN: 10.0000 mg | INTRAMUSCULAR | Status: AC | PRN

## 2023-01-26 MED ORDER — SODIUM CHLORIDE 0.9 % WEIGHT BASED INFUSION: 1.0000 mL/kg/h | INTRAVENOUS | Status: DC

## 2023-01-26 MED ORDER — ACETAMINOPHEN 325 MG PO TABS: 650.0000 mg | ORAL_TABLET | ORAL | Status: DC | PRN

## 2023-01-26 MED ORDER — SODIUM CHLORIDE 0.9% FLUSH: 3.0000 mL | Freq: Two times a day (BID) | INTRAVENOUS | Status: DC

## 2023-01-26 SURGICAL SUPPLY — 21 items
BALLN ~~LOC~~ EMERGE MR 3.25X8 (BALLOONS) ×1
BALLOON ~~LOC~~ EMERGE MR 3.25X8 (BALLOONS) IMPLANT
CATH 5FR JL3.5 JR4 ANG PIG MP (CATHETERS) IMPLANT
CATH BALLN WEDGE 5F 110CM (CATHETERS) IMPLANT
CATH VISTA GUIDE 6FR JR4 (CATHETERS) IMPLANT
DEVICE RAD COMP TR BAND LRG (VASCULAR PRODUCTS) IMPLANT
ELECT DEFIB PAD ADLT CADENCE (PAD) IMPLANT
GLIDESHEATH SLEND A-KIT 6F 22G (SHEATH) IMPLANT
GLIDESHEATH SLEND SS 6F .021 (SHEATH) IMPLANT
GUIDEWIRE INQWIRE 1.5J.035X260 (WIRE) IMPLANT
INQWIRE 1.5J .035X260CM (WIRE) ×1
KIT ENCORE 26 ADVANTAGE (KITS) IMPLANT
KIT HEMO VALVE WATCHDOG (MISCELLANEOUS) IMPLANT
KIT SYRINGE INJ CVI SPIKEX1 (MISCELLANEOUS) IMPLANT
PACK CARDIAC CATHETERIZATION (CUSTOM PROCEDURE TRAY) ×1 IMPLANT
SET ATX-X65L (MISCELLANEOUS) IMPLANT
SHEATH GLIDE SLENDER 4/5FR (SHEATH) IMPLANT
SHEATH PROBE COVER 6X72 (BAG) IMPLANT
STENT SYNERGY XD 3.0X16 (Permanent Stent) IMPLANT
SYNERGY XD 3.0X16 (Permanent Stent) ×1 IMPLANT
WIRE RUNTHROUGH .014X180CM (WIRE) IMPLANT

## 2023-01-26 NOTE — Progress Notes (Signed)
CARDIAC REHAB PHASE I     Post stent education including site care, restrictions, risk factors, exercise guidelines, NTG use, antiplatelet therapy importance, heart healthy diet, and CRP2 reviewed. All questions and concerns addressed. Will refer to [facility] for CRP2. Plan for home later today.   [phone]  Woodroe Chen, RN BSN 01/26/2023 1:37 PM

## 2023-01-26 NOTE — Discharge Instructions (Signed)

## 2023-01-26 NOTE — Interval H&P Note (Signed)
Cath Lab Visit (complete for each Cath Lab visit)  Clinical Evaluation Leading to the Procedure:   ACS: No.  Non-ACS:    Anginal Classification: CCS III  Anti-ischemic medical therapy: Minimal Therapy (1 class of medications)  Non-Invasive Test Results: Intermediate-risk stress test findings: cardiac mortality 1-3%/year  Prior CABG: No previous CABG   Mild LV dysfunction   History and Physical Interval Note:  01/26/2023 8:31 AM  [name] [name]  has presented today for surgery, with the diagnosis of shortness of breath abnromal cardiac CT.  The various methods of treatment have been discussed with the patient and family. After consideration of risks, benefits and other options for treatment, the patient has consented to  Procedure(s): RIGHT/LEFT HEART CATH AND CORONARY ANGIOGRAPHY (N/A) as a surgical intervention.  The patient's history has been reviewed, patient examined, no change in status, stable for surgery.  I have reviewed the patient's chart and labs.  Questions were answered to the patient's satisfaction.     Lance Muss

## 2023-01-26 NOTE — Progress Notes (Signed)
Called to pt room in short stay for forearm hematoma. Large hematoma found extending from distal wrist to roughly 7-10cm from crease of elbow proximally. B/P cuff placed on hematoma and inflated to under SBP. Left on for 15 minutes and slowly decreased pressure. Pt had positive result with softening of hematoma and pain/pressure relief. Dr. Eldridge Dace to bedside to see pt, agreed on hematoma care and plan to keep inflating up and down the arm with b/p cuff and monitor with 12cc air replaced in TR band over right ulnar skin hole and arteriotomy site. Continued to move B/P cuff down arm until all area was compressed and soft. Pt understands to call if signs of swelling growing return. P11 Primary RN witnessed sight and informed of TR band and Dr. Eldridge Dace aware of result. Primary RN will continue to monitor and contact us if hematoma returns.

## 2023-01-26 NOTE — Progress Notes (Signed)
Hematoma resolved, will resume air removal from TR band at 1415. Will continue to monitor

## 2023-01-26 NOTE — Progress Notes (Signed)
Assumed patient care 1540.  1615- patient was concerned that right arm was swelling again. Adam from cath lab came and assessed arm and stated arm was soft and looked good to him. Also PA came to assess arm and stated it looked and felt good to her.

## 2023-01-26 NOTE — Discharge Summary (Cosign Needed)
Discharge Summary for Same Day PCI   Patient ID: [name] [name] MRN: [id number]; DOB: 08-31-1955  Admit date: 01/26/2023 Discharge date: 01/26/2023  Primary Care Provider: Alysia Penna, MD  Primary Cardiologist: Lance Muss, MD  Primary Electrophysiologist:  None   Discharge Diagnoses    Principal Problem:   Dyspnea Active Problems:   CAD (coronary artery disease)   Hypertension   Chronic combined systolic and diastolic CHF (congestive heart failure) (HCC)   Hyperlipidemia    Diagnostic Studies/Procedures    Cardiac Catheterization 01/26/2023:   Prox RCA lesion is 75% stenosed.   A drug-eluting stent was successfully placed using a SYNERGY XD 3.0X16, postdilated to greater than 3.25 mm.   Post intervention, there is a 0% residual stenosis.   The left ventricular systolic function is normal.   LV end diastolic pressure is normal.   The left ventricular ejection fraction is 50-55% by visual estimate.   Hemodynamic findings consistent with mild pulmonary hypertension.   There is no aortic valve stenosis.   In the absence of any other complications or medical issues, we expect the patient to be ready for discharge from an interventional cardiology perspective on 01/26/2023.   Recommend uninterrupted dual antiplatelet therapy with Aspirin 81mg  daily and Clopidogrel 75mg  daily for a minimum of 6 months (stable ischemic heart disease-Class I recommendation).   On room air, aortic saturation 91%, PA saturation 74%, mean right atrial pressure 8 mmHg, PA pressure 40/18, mean PA pressure 28 mmHg, mean pulmonary capillary wedge pressure 11 mmHg, cardiac output 8.3 L/min, cardiac index 4.4.   Mild pulmonary artery hypertension, likely related to her prior lung disease. Successful drug-eluting stent placement to the proximal RCA.  Continue clopidogrel for 6 months along with aspirin.  Continue aggressive secondary prevention.   Plan for same-day discharge.  Results  discussed with the patient's daughter, [name].  Diagnostic Dominance: Co-dominant  Intervention      _____________   History of Present Illness     [name] [name] is a 67 y.o. female with a history of CAD noted on coronary CTA in 2020, chronic combined CHF with EF of 40-45%, hypertension, hyperlipidemia, lung cancer, and prior tobacco use. She was previously followed by Dr. Katrinka Blazing and now follows with Dr. Eldridge Dace. She recently saw Dr. Eldridge Dace on 01/21/2023 at which time she reported persistent shortness of breath. GDMT has been limited in the past due to medication intolerances. Therefore, outpatient right/ left cardiac catheterization was arranged for further evaluation.  Hospital Course     Patient presented to [facility] on 01/26/2023 for planned outpatient cardiac catheterization. R/ LHC showed 75% stenosis of proximal and otherwise only minimal disease. Hemodynamic findings were consistent with mild pulmonary hypertension likely related to lung disease (mean pulmonary capillary wedge pressure of 11 mmHg). She underwent successful PC with DES to RCA lesion.  Plan is for DAPT with Aspirin 81mg  daily and Plavix 75mg  daily for at least 6 months. Of note, ultrasound showed ulnar artery was larger than the radial artery so ulnar artery was used for cath. The patient was seen by Cardiac Rehab while in short stay. She did develop a large hematoma post cath. BP cuff was placed on hematoma and inflated to 15 mmHg under systolic BP and left on for 15 minutes and then slowly decreased with significant improvement in hematoma. I personally re-evaluated right ulnar cath site prior to discharge (as did cath lab staff) - she still has a mild hematoma but it is soft. She has  a good radial and ulnar pulse with good sensation; therefore, felt to be stable for discharge. Instructions/precautions regarding cath site care were given prior to discharge.  [name] [name] was seen by Dr. Eldridge Dace and  determined stable for discharge home. Follow up with our office has been arranged. Medications are listed below. Pertinent changes include the addition of Plavix and an increase of home Crestor to 20mg  daily. Will need repeat lipid panel and LFTs in 6-8 weeks.    _____________  Cath/PCI Registry Performance & Quality Measures: Aspirin prescribed? - Yes ADP Receptor Inhibitor (Plavix/Clopidogrel, Brilinta/Ticagrelor or Effient/Prasugrel) prescribed (includes medically managed patients)? - Yes High Intensity Statin (Lipitor 40-80mg  or Crestor 20-40mg ) prescribed? - Yes For EF <40%, was ACEI/ARB prescribed? - Not Applicable (EF >/= 40%) For EF <40%, Aldosterone Antagonist (Spironolactone or Eplerenone) prescribed? - Not Applicable (EF >/= 40%) Cardiac Rehab Phase II ordered (Included Medically managed Patients)? - Yes  _____________   Discharge Vitals Blood pressure 137/80, pulse 76, temperature 97.7 F (36.5 C), temperature source Oral, resp. rate (!) 22, height 5\' 5"  (1.651 m), weight 79.8 kg, SpO2 97%.  Filed Weights   01/26/23 0711  Weight: 79.8 kg    Last Labs & Radiologic Studies    CBC Recent Labs    01/26/23 0858 01/26/23 0859  HGB 13.6 13.9  HCT 40.0 41.0   Basic Metabolic Panel Recent Labs    52/84/13 0858 01/26/23 0859  NA 142 142  K 3.4* 3.4*   Liver Function Tests No results for input(s): "AST", "ALT", "ALKPHOS", "BILITOT", "PROT", "ALBUMIN" in the last 72 hours. No results for input(s): "LIPASE", "AMYLASE" in the last 72 hours. High Sensitivity Troponin:   No results for input(s): "TROPONINIHS" in the last 720 hours.  BNP Invalid input(s): "POCBNP" D-Dimer No results for input(s): "DDIMER" in the last 72 hours. Hemoglobin A1C No results for input(s): "HGBA1C" in the last 72 hours. Fasting Lipid Panel No results for input(s): "CHOL", "HDL", "LDLCALC", "TRIG", "CHOLHDL", "LDLDIRECT" in the last 72 hours. Thyroid Function Tests No results for input(s):  "TSH", "T4TOTAL", "T3FREE", "THYROIDAB" in the last 72 hours.  Invalid input(s): "FREET3" _____________  CARDIAC CATHETERIZATION  Result Date: 01/26/2023   Prox RCA lesion is 75% stenosed.   A drug-eluting stent was successfully placed using a SYNERGY XD 3.0X16, postdilated to greater than 3.25 mm.   Post intervention, there is a 0% residual stenosis.   The left ventricular systolic function is normal.   LV end diastolic pressure is normal.   The left ventricular ejection fraction is 50-55% by visual estimate.   Hemodynamic findings consistent with mild pulmonary hypertension.   There is no aortic valve stenosis.   In the absence of any other complications or medical issues, we expect the patient to be ready for discharge from an interventional cardiology perspective on 01/26/2023.   Recommend uninterrupted dual antiplatelet therapy with Aspirin 81mg  daily and Clopidogrel 75mg  daily for a minimum of 6 months (stable ischemic heart disease-Class I recommendation).   On room air, aortic saturation 91%, PA saturation 74%, mean right atrial pressure 8 mmHg, PA pressure 40/18, mean PA pressure 28 mmHg, mean pulmonary capillary wedge pressure 11 mmHg, cardiac output 8.3 L/min, cardiac index 4.4. Mild pulmonary artery hypertension, likely related to her prior lung disease. Successful drug-eluting stent placement to the proximal RCA.  Continue clopidogrel for 6 months along with aspirin.  Continue aggressive secondary prevention. Plan for same-day discharge.  Results discussed with the patient's daughter, [name].    Disposition  Patient is being discharged home today in good condition.  Follow-up Plans & Appointments     Follow-up Information     Louanne Skye Devoria Albe., NP Follow up.   Specialty: Cardiology Why: Follow-up with Cardiology scheduled fo 02/03/2023 at 2:20pm. Please arrive 15 minutes early for check-in. If this date/ time does not work for you, please call our office to reschedule. Contact  information: [address] [place] [address] [phone]                Discharge Instructions     Amb Referral to Cardiac Rehabilitation   Complete by: As directed    Diagnosis: Coronary Stents   After initial evaluation and assessments completed: Virtual Based Care may be provided alone or in conjunction with Phase 2 Cardiac Rehab based on patient barriers.: Yes   Intensive Cardiac Rehabilitation (ICR) [facility] location only OR Traditional Cardiac Rehabilitation (TCR) *If criteria for ICR are not met will enroll in TCR [facility] only): Yes        Discharge Medications   Allergies as of 01/26/2023       Reactions   Advil [ibuprofen] Other (See Comments)   Causes hematuria   Amoxicillin-pot Clavulanate Nausea And Vomiting, Other (See Comments)   Severe dehydration   Carvedilol    SOB   Codeine Hives   Hydrocodone-guaifenesin Itching   Sulfa Antibiotics Hives, Swelling   Temazepam Other (See Comments)   dizziness   Plasticized Base [plastibase] Rash   tape   Zofran [ondansetron Hcl] Palpitations        Medication List     TAKE these medications    aspirin EC 81 MG tablet Take 1 tablet (81 mg total) by mouth daily. Swallow whole.   Belsomra 10 MG Tabs Generic drug: Suvorexant Take 1 tablet (10 mg total) by mouth at bedtime as needed.   clopidogrel 75 MG tablet Commonly known as: Plavix Take 1 tablet (75 mg total) by mouth daily.   doxycycline 100 MG tablet Commonly known as: VIBRA-TABS Take 1 tablet (100 mg total) by mouth 2 (two) times daily. Notes to patient: Complete a total of 7 days.    Entresto 49-51 MG Generic drug: sacubitril-valsartan Take 1 tablet by mouth 2 (two) times daily.   loratadine 10 MG tablet Commonly known as: CLARITIN Take 10 mg by mouth daily as needed for rhinitis or allergies.   neomycin-bacitracin-polymyxin [phone] ointment Apply 1 Application topically daily as needed (wound care).    nitroGLYCERIN 0.4 MG SL tablet Commonly known as: NITROSTAT Place 1 tablet (0.4 mg total) under the tongue every 5 (five) minutes as needed for chest pain.   rosuvastatin 20 MG tablet Commonly known as: CRESTOR Take 1 tablet (20 mg total) by mouth daily. What changed:  medication strength how much to take   Stiolto Respimat 2.5-2.5 MCG/ACT Aers Generic drug: Tiotropium Bromide-Olodaterol Inhale 2 puffs into the lungs daily.   Ventolin HFA 108 (90 Base) MCG/ACT inhaler Generic drug: albuterol INHALE 1 PUFF BY MOUTH THREE TIMES DAILY AS NEEDED FOR WHEEZING FOR SHORTNESS OF BREATH   Vitamin D (Ergocalciferol) 1.25 MG (50000 UNIT) Caps capsule Commonly known as: DRISDOL Take 50,000 Units by mouth every 7 (seven) days.           Allergies Allergies  Allergen Reactions   Advil [Ibuprofen] Other (See Comments)    Causes hematuria    Amoxicillin-Pot Clavulanate Nausea And Vomiting and Other (See Comments)    Severe dehydration   Carvedilol  SOB   Codeine Hives   Hydrocodone-Guaifenesin Itching   Sulfa Antibiotics Hives and Swelling   Temazepam Other (See Comments)    dizziness   Plasticized Base [Plastibase] Rash    tape   Zofran [Ondansetron Hcl] Palpitations    Outstanding Labs/Studies   Repeat lipid panel and LFTs in 6-8 weeks.  Duration of Discharge Encounter   Greater than 30 minutes including physician time.  Signed, Corrin Parker, PA-C 01/26/2023, 5:12 PM  I have examined the patient and reviewed assessment and plan and discussed with patient.  Agree with above as stated.    S/p RCA stent.  Mild pulmonary HTN noted as well.    Right forearm hematoma.  Treated with BP cuff compressions.  Improved swelling and extra observation time.  OK for same day PCI.  Daughter will be with her.  Stressed importance of clopidogrel and aspirin, ideally for 6 months.   Lance Muss

## 2023-01-27 ENCOUNTER — Encounter (HOSPITAL_COMMUNITY): Payer: Self-pay | Admitting: Interventional Cardiology

## 2023-01-30 NOTE — Progress Notes (Signed)
Subjective: Chief Complaint  Patient presents with   Ingrown Toenail    Ingrown toenail follow up, pt stated  that the toe was purple over the weekend. Pt present today to see if the toe did not get infected.    67 year old female presents the office with above concerns.  She recently went up ingrown toenail removal with Dr. Charlsie Merles on January 12, 2023.  She denies some purple discoloration as well as some redness.  She is concerned about infection.  She still been soaking.  No fevers or chills.  Objective: AAO x3, NAD DP/PT pulses palpable bilaterally, CRT less than 3 seconds Status post nail removal.  Scab is present but there is localized edema and erythema present around the nail.  Unable to appreciate any drainage or pus there is no fluctuation.  There is no drainable collection noted today.  No ascending cellulitis. No pain with calf compression, swelling, warmth, erythema  Assessment: Status post nail removal with localized infection  Plan: -All treatment options discussed with the patient including all alternatives, risks, complications.  -Encouraged to continue soaking Epsom salts twice a day covered with antibiotic ointment and a bandage.  Prescribed doxycycline.  Continue shoes to avoid excess pressure on the toe. -Monitor for any clinical signs or symptoms of infection and directed to call the office immediately should any occur or go to the ER. -Patient encouraged to call the office with any questions, concerns, change in symptoms.   Vivi Barrack DPM

## 2023-01-31 ENCOUNTER — Other Ambulatory Visit: Payer: Self-pay | Admitting: Interventional Cardiology

## 2023-02-01 ENCOUNTER — Telehealth (HOSPITAL_COMMUNITY): Payer: Self-pay

## 2023-02-01 NOTE — Progress Notes (Unsigned)
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 she was given albuterol inhaler & had steroid with surg.     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, transient, takes Verapimil for migraine & BP control    Mild obstructive sleep apnea 07/28/2021   Pain of right thumb 06/24/2017   Plantar fasciitis of left foot 03/12/2022   PONV (postoperative nausea and vomiting)    Primary cancer of right lower lobe of lung 04/17/2014   Shortness of breath 09/17/2019   Sprain of lateral ligament of ankle joint 09/11/2021   Subjective memory complaints 07/16/2022   Tenosynovitis of both hands 12/04/2021   Thyroid nodule    benign by biopsy   Tightness of left gastrocnemius muscle 03/12/2022   Transient visual loss 01/25/2012   Varicose veins    left leg   Vitamin D deficiency    Wears glasses    Past Surgical History:  Procedure Laterality Date   CARPAL TUNNEL RELEASE Left    CESAREAN SECTION     COLONOSCOPY W/ BIOPSIES AND POLYPECTOMY     CORONARY STENT INTERVENTION N/A 01/26/2023   Procedure: CORONARY STENT  INTERVENTION;  Surgeon: Corky Crafts, MD;  Location: [facility] INVASIVE CV LAB;  Service: Cardiovascular;  Laterality: N/A;   DILATION AND CURETTAGE OF UTERUS     RIGHT/LEFT HEART CATH AND CORONARY ANGIOGRAPHY N/A 01/26/2023   Procedure: RIGHT/LEFT HEART CATH AND CORONARY ANGIOGRAPHY;  Surgeon: Corky Crafts, MD;  Location: [facility] INVASIVE CV LAB;  Service: Cardiovascular;  Laterality: N/A;   TONSILLECTOMY     VARICOSE VEIN SURGERY     left   VIDEO ASSISTED THORACOSCOPY (VATS)/ LOBECTOMY Right 07/23/2014   Procedure: VIDEO ASSISTED THORACOSCOPY (VATS)/ LOBECTOMY;  Surgeon: Kerin Perna, MD;  Location: [facility] OR;  Service: Thoracic;  Laterality: Right;   VIDEO BRONCHOSCOPY N/A 07/23/2014   Procedure: VIDEO BRONCHOSCOPY;  Surgeon: Kerin Perna, MD;  Location: [facility] OR;  Service: Thoracic;  Laterality: N/A;   VIDEO BRONCHOSCOPY WITH ENDOBRONCHIAL ULTRASOUND Right 04/17/2014   Procedure: VIDEO BRONCHOSCOPY WITH ENDOBRONCHIAL ULTRASOUND;  Surgeon: Leslye Peer, MD;  Location: [facility] OR;  Service: Thoracic;  Laterality: Right;   VIDEO BRONCHOSCOPY WITH ENDOBRONCHIAL ULTRASOUND N/A 07/08/2014   Procedure: VIDEO BRONCHOSCOPY WITH ENDOBRONCHIAL ULTRASOUND;  Surgeon: Kerin Perna, MD;  Location: [facility] OR;  Service: Thoracic;  Laterality: N/A;    Allergies  Allergies  Allergen Reactions   Advil [Ibuprofen] Other (See Comments)    Causes hematuria    Amoxicillin-Pot Clavulanate Nausea And Vomiting and Other (See Comments)    Severe  dehydration   Carvedilol     SOB   Codeine Hives   Hydrocodone-Guaifenesin Itching   Sulfa Antibiotics Hives and Swelling   Temazepam Other (See Comments)    dizziness   Plasticized Base [Plastibase] Rash    tape   Zofran [Ondansetron Hcl] Palpitations     History of Present Illness    [name] [name]  is a 67 year old female with a PMH of CAD s/p R/LHC with 75% proximal RCA treated with DES x 1, mild pulmonary HTN, HLD, PVCs, non-small cell lung cancer  stage IIIa s/p partial pneumonectomy and radiation, COPD, GERD who presents today for post PCI follow-up.  Ms. Dust was seen initially by Dr. Katrinka Blazing in 2015 PVCs and is currently followed by Dr. Eldridge Dace.  She underwent a coronary CT in 2019 that showed presence of calcifications.  She completed a nuclear perfusion study that was low risk for ischemia.  She underwent a coronary CTA in 02/2019 that showed moderate blockage in RCA of 50 to 70%.  Medical therapy was elected at that time with statin and aspirin.  She was seen initially by Dr. Eldridge Dace and 07/07/2022 and 2D echo have been completed prior showing chronic combined systolic and diastolic CHF with EF of 40-45%.  GDMT was titrated low-dose Entresto.  She was seen on 01/21/2023 and patient continued to have complaint of persistent shortness of breath.  She underwent R/LHC which revealed 75% stenosis in the proximal RCA that was treated with PCI/DES x 1 patient was placed on DAPT for 6 months with aspirin and clopidogrel.  Right heart catheterization also noted mild pulmonary hypertension and patient developed hematoma postprocedure in right radial artery.  Since last being seen in the office patient reports she has been doing better since her heart cath procedure.  She reports that her shortness of breath has improved since her heart catheterization. Her blood pressure today is controlled at 122/70 and heart rate is 95 bpm.  She is compliant with her current medications and denies any adverse reactions.  She developed a hematoma to her right radial and ulna site which has improved and is currently soft with out inflammation.  She is interested in cardiac rehab and would like to pursue at this time.  During today's visit we discussed her left heart cath and reviewed her treatment plan regarding her repeat echo.  She had all questions answered to her satisfaction during today's visit.  Patient denies chest pain, palpitations, dyspnea, PND, orthopnea, nausea,  vomiting, dizziness, syncope, edema, weight gain, or early satiety.  Home Medications    Current Outpatient Medications  Medication Sig Dispense Refill   aspirin EC 81 MG tablet Take 1 tablet (81 mg total) by mouth daily. Swallow whole. 30 tablet 12   clopidogrel (PLAVIX) 75 MG tablet Take 1 tablet (75 mg total) by mouth daily. 90 tablet 1   doxycycline (VIBRA-TABS) 100 MG tablet Take 1 tablet (100 mg total) by mouth 2 (two) times daily. 14 tablet 0   loratadine (CLARITIN) 10 MG tablet Take 10 mg by mouth daily as needed for rhinitis or allergies.     neomycin-bacitracin-polymyxin (NEOSPORIN) [phone] ointment Apply 1 Application topically daily as needed (wound care).     nitroGLYCERIN (NITROSTAT) 0.4 MG SL tablet Place 1 tablet (0.4 mg total) under the tongue every 5 (five) minutes as needed for chest pain. 25 tablet 6   rosuvastatin (CRESTOR) 20 MG tablet Take 1 tablet (20 mg total) by mouth daily.  sacubitril-valsartan (ENTRESTO) 49-51 MG Take 1 tablet by mouth 2 (two) times daily. 60 tablet 6   Suvorexant (BELSOMRA) 10 MG TABS Take 1 tablet (10 mg total) by mouth at bedtime as needed. 30 tablet 2   Tiotropium Bromide-Olodaterol (STIOLTO RESPIMAT) 2.5-2.5 MCG/ACT AERS Inhale 2 puffs into the lungs daily.     VENTOLIN HFA 108 (90 Base) MCG/ACT inhaler INHALE 1 PUFF BY MOUTH THREE TIMES DAILY AS NEEDED FOR WHEEZING FOR SHORTNESS OF BREATH 18 g 0   Vitamin D, Ergocalciferol, (DRISDOL) 50000 units CAPS capsule Take 50,000 Units by mouth every 7 (seven) days.     No current facility-administered medications for this visit.     Review of Systems  Please see the history of present illness.    (+) Fatigue (+) Bruising at left ulna and radial site  All other systems reviewed and are otherwise negative except as noted above.  Physical Exam    Wt Readings from Last 3 Encounters:  01/26/23 176 lb (79.8 kg)  01/21/23 176 lb 3.2 oz (79.9 kg)  01/19/23 177 lb 9.6 oz (80.6 kg)   ZO:[id number]  were no vitals filed for this visit.,There is no height or weight on file to calculate BMI.  Constitutional:      Appearance: Healthy appearance. Not in distress.  Neck:     Vascular: JVD normal.  Pulmonary:     Effort: Pulmonary effort is normal.     Breath sounds: No wheezing. No rales. Diminished in the bases Cardiovascular:     Patient's Site is clean dry and intact with deep mottling but no evidence of hematoma present.  Normal rate. Regular rhythm. Normal S1. Normal S2.      Murmurs: There is no murmur.  Edema:    Peripheral edema absent.  Abdominal:     Palpations: Abdomen is soft non tender. There is no hepatomegaly.  Skin:    General: Skin is warm and dry.  Neurological:     General: No focal deficit present.     Mental Status: Alert and oriented to person, place and time.     Cranial Nerves: Cranial nerves are intact.  EKG/LABS/ Recent Cardiac Studies    ECG personally reviewed by me today -none completed today   Lab Results  Component Value Date   CREATININE 0.73 07/23/2022   BUN 12 07/23/2022   NA 142 01/26/2023   K 3.4 (L) 01/26/2023   CL 109 (H) 07/23/2022   CO2 24 07/23/2022   Lab Results  Component Value Date   ALT 11 02/15/2022   AST 16 02/15/2022   ALKPHOS 95 02/15/2022   BILITOT 0.6 02/15/2022   Lab Results  Component Value Date   CHOL 127 11/14/2020   HDL 68 11/14/2020   LDLCALC 48 11/14/2020   TRIG 46 11/14/2020   CHOLHDL 1.9 11/14/2020    No results found for: "HGBA1C"   Assessment & Plan    1.  Coronary artery disease: -Patient underwent right/LHC due to progressive shortness of breath on 01/26/2023 showing 75% stenosis and proximal RCA treated with DES x 1 -Today patient reports improvement to shortness of breath since heart catheterization. -She is interested in cardiac rehab and is cleared to proceed at this time. -Postprocedure patient had significant hematoma due to ulnar artery that was used for cath.  The site today has improved  and is soft with lots of mottling but no evidence of inflammation. -She was advised to elevate her extremity when dependent -Please continue GDMT with ASA  81 mg, Plavix 75 mg, Crestor 20 mg daily  2.  Chronic combined CHF: -2D echo was completed 06/2022 showing EF of 40-45% with mildly decreased LV function and global hypokinesis -Patient was euvolemic on examination today and has less shortness of breath since left heart cath. -Continue GDMT with Entresto 49/51 mg twice daily -We will plan to repeat echo in 1 month -Low sodium diet, fluid restriction <2L, and daily weights encouraged. Educated to contact our office for weight gain of 2 lbs overnight or 5 lbs in one week.   3.  Essential hypertension: -Patient's blood pressure today was controlled at 122/70 -Continue Entresto 49/51 mg twice daily  4.  COPD Gold 2B: -Patient currently followed by pulmonology with improvement to shortness of breath since heart cath procedure.    Cardiac Rehabilitation Eligibility Assessment       Disposition: Follow-up with Lance Muss, MD or APP in 1 months    Medication Adjustments/Labs and Tests Ordered: Current medicines are reviewed at length with the patient today.  Concerns regarding medicines are outlined above.   Signed, Napoleon Form, Leodis Rains, NP 02/01/2023, 1:07 PM [facility]

## 2023-02-01 NOTE — Telephone Encounter (Signed)
Pt insurance is active and benefits verified through Medicare A/B. Co-pay $0.00, DED $240.00/$240.00 met, out of pocket $0.00/$0.00 met, co-insurance 20%. No pre-authorization required. Passport, 02/01/23 @ 2:49PM, REF#[id number]   How many CR sessions are covered? (36 visits for TCR, 72 visits for ICR)72 Is this a lifetime maximum or an annual maximum? Lifetime Has the member used any of these services to date? No Is there a time limit (weeks/months) on start of program and/or program completion? No   2ndary insurance is active and benefits verified through [place]. Co-pay $0.00, DED $0.00/$0.00 met, out of pocket $0.00/$0.00 met, co-insurance 0%. No pre-authorization required. Passport, 02/01/23 @ 2:51PM, REF#[id number]      Will contact patient to see if she is interested in the Cardiac Rehab Program. If interested, patient will need to complete follow up appt. Once completed, patient will be contacted for scheduling upon review by the RN Navigator.

## 2023-02-01 NOTE — Telephone Encounter (Signed)
Attempted to call patient in regards to Cardiac Rehab - LM on VM 

## 2023-02-02 MED ORDER — ENTRESTO 49-51 MG PO TABS: 1.0000 | ORAL_TABLET | Freq: Two times a day (BID) | ORAL | 3 refills | Status: DC

## 2023-02-03 ENCOUNTER — Encounter: Payer: Self-pay | Admitting: Nurse Practitioner

## 2023-02-03 ENCOUNTER — Ambulatory Visit: Payer: Medicare Other | Attending: Nurse Practitioner | Admitting: Nurse Practitioner

## 2023-02-03 ENCOUNTER — Telehealth (HOSPITAL_COMMUNITY): Payer: Self-pay

## 2023-02-03 ENCOUNTER — Encounter (HOSPITAL_COMMUNITY): Payer: Self-pay

## 2023-02-03 VITALS — BP 122/70 | HR 95 | Ht 65.0 in | Wt 174.0 lb

## 2023-02-03 DIAGNOSIS — I2510 Atherosclerotic heart disease of native coronary artery without angina pectoris: Secondary | ICD-10-CM | POA: Insufficient documentation

## 2023-02-03 DIAGNOSIS — I10 Essential (primary) hypertension: Secondary | ICD-10-CM | POA: Diagnosis not present

## 2023-02-03 DIAGNOSIS — J449 Chronic obstructive pulmonary disease, unspecified: Secondary | ICD-10-CM | POA: Insufficient documentation

## 2023-02-03 DIAGNOSIS — I5042 Chronic combined systolic (congestive) and diastolic (congestive) heart failure: Secondary | ICD-10-CM | POA: Insufficient documentation

## 2023-02-03 NOTE — Telephone Encounter (Signed)
Pt returned CR phone called and stated she is interested. She will come in for orientation on 02/08/23 @ 10:30AM and will attend the 11:45AM class.   Mailed letter

## 2023-02-03 NOTE — Telephone Encounter (Signed)
Attempted to call patient in regards to Cardiac Rehab - LM on VM Mailed letter 

## 2023-02-03 NOTE — Patient Instructions (Addendum)
Medication Instructions:  Your physician recommends that you continue on your current medications as directed. Please refer to the Current Medication list given to you today. *If you need a refill on your cardiac medications before your next appointment, please call your pharmacy*   Lab Work: None ordered   Testing/Procedures: Your physician has requested that you have an echocardiogram. Echocardiography is a painless test that uses sound waves to create images of your heart. It provides your doctor with information about the size and shape of your heart and how well your heart's chambers and valves are working. This procedure takes approximately one hour. There are no restrictions for this procedure. Please do NOT wear cologne, perfume, aftershave, or lotions (deodorant is allowed). Please arrive 15 minutes prior to your appointment time. Please schedule in 1 month.   Follow-Up: At [facility], you and your health needs are our priority.  As part of our continuing mission to provide you with exceptional heart care, we have created designated Provider Care Teams.  These Care Teams include your primary Cardiologist (physician) and Advanced Practice Providers (APPs -  Physician Assistants and Nurse Practitioners) who all work together to provide you with the care you need, when you need it.  We recommend signing up for the patient portal called "MyChart".  Sign up information is provided on this After Visit Summary.  MyChart is used to connect with patients for Virtual Visits (Telemedicine).  Patients are able to view lab/test results, encounter notes, upcoming appointments, etc.  Non-urgent messages can be sent to your provider as well.   To learn more about what you can do with MyChart, go to [email or web address].    Your next appointment:   3 month(s)  Provider:   Robin Searing, NP Then, Donato Schultz, MD will plan to see you again in 3 month(s).    Other Instructions

## 2023-02-04 ENCOUNTER — Telehealth (HOSPITAL_COMMUNITY): Payer: Self-pay | Admitting: *Deleted

## 2023-02-04 ENCOUNTER — Telehealth (HOSPITAL_COMMUNITY): Payer: Self-pay

## 2023-02-04 NOTE — Telephone Encounter (Signed)
Spoke with the patient. Confirmed appointment for cardiac orientation on 02/08/23.Thayer Headings RN BSN

## 2023-02-08 ENCOUNTER — Telehealth: Payer: Self-pay | Admitting: Emergency Medicine

## 2023-02-08 ENCOUNTER — Encounter (HOSPITAL_COMMUNITY)
Admission: RE | Admit: 2023-02-08 | Discharge: 2023-02-08 | Disposition: A | Discharge status: Home / Self Care | Payer: Medicare Other | Source: Ambulatory Visit | Attending: Interventional Cardiology | Admitting: Interventional Cardiology

## 2023-02-08 VITALS — BP 126/72 | HR 73 | Ht 66.0 in | Wt 175.9 lb

## 2023-02-08 DIAGNOSIS — Z48812 Encounter for surgical aftercare following surgery on the circulatory system: Secondary | ICD-10-CM | POA: Insufficient documentation

## 2023-02-08 DIAGNOSIS — Z955 Presence of coronary angioplasty implant and graft: Secondary | ICD-10-CM | POA: Diagnosis not present

## 2023-02-08 NOTE — Progress Notes (Signed)
Cardiac Individual Treatment Plan  Patient Details  Name: [name] [name] MRN: [id number] Date of Birth: [birth year] Referring Provider:   Flowsheet Row INTENSIVE CARDIAC REHAB ORIENT from 02/08/2023 in [facility], Vascular, & Lung [facility]  Referring Provider Lance Muss, MD       Initial Encounter Date:  Flowsheet Row INTENSIVE CARDIAC REHAB ORIENT from 02/08/2023 in [facility] for Heart, Vascular, [facility] Lung [facility]  Date 02/08/23       Visit Diagnosis: 01/26/23 Status post coronary artery stent placement  Patient's Home Medications on Admission:  Current Outpatient Medications:    aspirin EC 81 MG tablet, Take 1 tablet (81 mg total) by mouth daily. Swallow whole., Disp: 30 tablet, Rfl: 12   clopidogrel (PLAVIX) 75 MG tablet, Take 1 tablet (75 mg total) by mouth daily., Disp: 90 tablet, Rfl: 1   loratadine (CLARITIN) 10 MG tablet, Take 10 mg by mouth daily as needed for rhinitis or allergies., Disp: , Rfl:    neomycin-bacitracin-polymyxin (NEOSPORIN) [phone] ointment, Apply 1 Application topically daily as needed (wound care)., Disp: , Rfl:    nitroGLYCERIN (NITROSTAT) 0.4 MG SL tablet, Place 1 tablet (0.4 mg total) under the tongue every 5 (five) minutes as needed for chest pain., Disp: 25 tablet, Rfl: 6   rosuvastatin (CRESTOR) 20 MG tablet, Take 1 tablet (20 mg total) by mouth daily., Disp: , Rfl:    sacubitril-valsartan (ENTRESTO) 49-51 MG, Take 1 tablet by mouth 2 (two) times daily., Disp: 180 tablet, Rfl: 3   Suvorexant (BELSOMRA) 10 MG TABS, Take 1 tablet (10 mg total) by mouth at bedtime as needed., Disp: 30 tablet, Rfl: 2   Tiotropium Bromide-Olodaterol (STIOLTO RESPIMAT) 2.5-2.5 MCG/ACT AERS, Inhale 2 puffs into the lungs daily., Disp: , Rfl:    VENTOLIN HFA 108 (90 Base) MCG/ACT inhaler, INHALE 1 PUFF BY MOUTH THREE TIMES DAILY AS NEEDED FOR WHEEZING FOR SHORTNESS OF BREATH, Disp: 18 g, Rfl: 0   Vitamin D,  Ergocalciferol, (DRISDOL) 50000 units CAPS capsule, Take 50,000 Units by mouth every 7 (seven) days., Disp: , Rfl:   Past Medical History: Past Medical History:  Diagnosis Date   Arthritis of hand 12/04/2021   CAD (coronary artery disease)    Central scotoma, 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 she was given albuterol inhaler & had steroid with surg.     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, transient, takes Verapimil for migraine & BP control    Mild obstructive sleep apnea 07/28/2021   Pain of right thumb 06/24/2017   Plantar fasciitis of left foot 03/12/2022   PONV (postoperative nausea and vomiting)    Primary cancer of right lower lobe of lung 04/17/2014   Shortness of breath 09/17/2019   Sprain of lateral ligament of ankle joint 09/11/2021   Subjective memory complaints 07/16/2022   Tenosynovitis of both hands 12/04/2021   Thyroid nodule  benign by biopsy   Tightness of left gastrocnemius muscle 03/12/2022   Transient visual loss 01/25/2012   Varicose veins    left leg   Vitamin D deficiency    Wears glasses     Tobacco Use: Social History   Tobacco Use  Smoking Status Former   Current packs/day: 0.00   Average packs/day: 0.5 packs/day for 40.0 years (20.0 ttl pk-yrs)   Types: Cigarettes   Start date: 03/17/1973   Quit date: 03/17/2013   Years since quitting: 9.9  Smokeless Tobacco Never    Labs: Review Flowsheet  More  data exists      Latest Ref Rng & Units 10/11/2017 04/30/2019 04/21/2020 11/14/2020 01/26/2023  Labs for ITP Cardiac and Pulmonary Rehab  Cholestrol 100 - 199 mg/dL 355  732  202  542  -  LDL (calc) 0 - 99 mg/dL 55  53  44  48  -  HDL-C >39 mg/dL 73  82  87  68  -  Trlycerides 0 - 149 mg/dL 54  64  60  46  -  PH, Arterial 7.35 - 7.45 - - - - 7.351   PCO2 arterial 32 - 48 mmHg - - - - 41.0   Bicarbonate 20.0 - 28.0 mmol/L - - - - 23.8  23.5  22.7   TCO2 22 - 32 mmol/L - - - - 25  25  24    Acid-base deficit 0.0 - 2.0 mmol/L - - - - 2.0  2.0  3.0   O2 Saturation % - - - - 75  72  91     Details       Multiple values from one day are sorted in reverse-chronological order         Capillary Blood Glucose: Lab Results  Component Value Date   GLUCAP 119 (H) 04/23/2014     Exercise Target Goals: Exercise Program Goal: Individual exercise prescription set using results from initial 6 min walk test and THRR while considering  patient's activity barriers and safety.   Exercise Prescription Goal: Initial exercise prescription builds to 30-45 minutes a day of aerobic activity, 2-3 days per week.  Home exercise guidelines will be given to patient during program as part of exercise prescription that the participant will acknowledge.  Activity Barriers & Risk Stratification:  Activity Barriers & Cardiac Risk Stratification - 02/08/23 1320       Activity Barriers & Cardiac Risk Stratification   Activity Barriers Balance Concerns;Arthritis;Joint Problems;Back Problems;Deconditioning;Shortness of Breath;Neck/Spine Problems    Cardiac Risk Stratification High             6 Minute Walk:  6 Minute Walk     Row Name 02/08/23 1143         6 Minute Walk   Phase Initial     Distance 1374 feet     Walk Time 6 minutes     # of Rest Breaks 0     MPH 2.6     METS 3.4     RPE 11     Perceived Dyspnea  1     VO2 Peak 11.8     Symptoms Yes (comment)     Comments SOB, RPD = 1      Resting HR 79 bpm     Resting BP 126/72     Resting Oxygen Saturation  97 %     Exercise Oxygen Saturation  during 6 min walk 96 %     Max Ex. HR 119  bpm     Max Ex. BP 150/80     2 Minute Post BP 132/70       Interval Oxygen   Interval Oxygen? Yes     Baseline Oxygen Saturation % 97 %     1 Minute Oxygen Saturation % 94 %     1 Minute Liters of Oxygen 0 L     2 Minute Oxygen Saturation % 97 %     2 Minute Liters of Oxygen 0 L     3 Minute Oxygen Saturation % 95 %     3 Minute Liters of Oxygen 0 L     4 Minute Oxygen Saturation % 96 %     4 Minute Liters of Oxygen 0 L     5 Minute Oxygen Saturation % 95 %     5 Minute Liters of Oxygen 0 L     6 Minute Oxygen Saturation % 96 %     6 Minute Liters of Oxygen 0 L     2 Minute Post Oxygen Saturation % 99 %              Oxygen Initial Assessment:   Oxygen Re-Evaluation:   Oxygen Discharge (Final Oxygen Re-Evaluation):   Initial Exercise Prescription:  Initial Exercise Prescription - 02/08/23 1300       Date of Initial Exercise RX and Referring Provider   Date 02/08/23    Referring Provider Lance Muss, MD    Expected Discharge Date 04/23/21      NuStep   Level 2    SPM 80    Minutes 15    METs 3.4      Recumbant Elliptical   Level 2    RPM 50    Watts 25    Minutes 15    METs 3.4      Prescription Details   Frequency (times per week) 3    Duration Progress to 30 minutes of continuous aerobic without signs/symptoms of physical distress      Intensity   THRR 40-80% of Max Heartrate 60-121    Ratings of Perceived Exertion 11-13    Perceived Dyspnea 0-4      Progression   Progression Continue progressive overload as per policy without signs/symptoms or physical distress.      Resistance Training   Training Prescription Yes    Weight 3 lbs    Reps 10-15             Perform Capillary Blood Glucose checks as needed.  Exercise Prescription Changes:   Exercise Comments:   Exercise  Goals and Review:   Exercise Goals     Row Name 02/08/23 1208             Exercise Goals   Increase Physical Activity Yes       Intervention Provide advice, education, support and counseling about physical activity/exercise needs.;Develop an individualized exercise prescription for aerobic and resistive training based on initial evaluation findings, risk stratification, comorbidities and participant's personal goals.       Expected Outcomes Short Term: Attend rehab on a regular basis to increase amount of physical activity.;Long Term: Add in home exercise to make exercise part of routine and to increase amount of physical activity.;Long Term: Exercising regularly at least 3-5 days a week.       Increase Strength and Stamina Yes       Intervention Provide advice, education, support and counseling about physical activity/exercise needs.;Develop an individualized exercise prescription for  aerobic and resistive training based on initial evaluation findings, risk stratification, comorbidities and participant's personal goals.       Expected Outcomes Short Term: Increase workloads from initial exercise prescription for resistance, speed, and METs.;Short Term: Perform resistance training exercises routinely during rehab and add in resistance training at home;Long Term: Improve cardiorespiratory fitness, muscular endurance and strength as measured by increased METs and functional capacity ( )       Able to understand and use rate of perceived exertion (RPE) scale Yes       Intervention Provide education and explanation on how to use RPE scale       Expected Outcomes Short Term: Able to use RPE daily in rehab to express subjective intensity level;Long Term:  Able to use RPE to guide intensity level when exercising independently       Able to understand and use Dyspnea scale Yes       Intervention Provide education and explanation on how to use Dyspnea scale       Expected Outcomes Short Term: Able to  use Dyspnea scale daily in rehab to express subjective sense of shortness of breath during exertion;Long Term: Able to use Dyspnea scale to guide intensity level when exercising independently       Knowledge and understanding of Target Heart Rate Range (THRR) Yes       Intervention Provide education and explanation of THRR including how the numbers were predicted and where they are located for reference       Expected Outcomes Short Term: Able to state/look up THRR;Short Term: Able to use daily as guideline for intensity in rehab;Long Term: Able to use THRR to govern intensity when exercising independently       Understanding of Exercise Prescription Yes       Intervention Provide education, explanation, and written materials on patient's individual exercise prescription       Expected Outcomes Short Term: Able to explain program exercise prescription;Long Term: Able to explain home exercise prescription to exercise independently                Exercise Goals Re-Evaluation :   Discharge Exercise Prescription (Final Exercise Prescription Changes):   Nutrition:  Target Goals: Understanding of nutrition guidelines, daily intake of sodium 1500mg , cholesterol 200mg , calories 30% from fat and 7% or less from saturated fats, daily to have 5 or more servings of fruits and vegetables.  Biometrics:  Pre Biometrics - 02/08/23 1209       Pre Biometrics   % Body Fat 41.9 %             Post Biometrics - 02/08/23 1209        Post  Biometrics   Waist Circumference 41 inches    Hip Circumference 46 inches    Waist to Hip Ratio 0.89 %    Triceps Skinfold 31 mm    Grip Strength 34 kg    Flexibility 13.5 in    Single Leg Stand 6.37 seconds             Nutrition Therapy Plan and Nutrition Goals:   Nutrition Assessments:  MEDIFICTS Score Key: ?70 Need to make dietary changes  40-70 Heart Healthy Diet ? 40 Therapeutic Level Cholesterol Diet    Picture Your Plate  Scores: <10 Unhealthy dietary pattern with much room for improvement. 41-50 Dietary pattern unlikely to meet recommendations for good health and room for improvement. 51-60 More healthful dietary pattern, with some room for improvement.  >60 Healthy  dietary pattern, although there may be some specific behaviors that could be improved.    Nutrition Goals Re-Evaluation:   Nutrition Goals Re-Evaluation:   Nutrition Goals Discharge (Final Nutrition Goals Re-Evaluation):   Psychosocial: Target Goals: Acknowledge presence or absence of significant depression and/or stress, maximize coping skills, provide positive support system. Participant is able to verbalize types and ability to use techniques and skills needed for reducing stress and depression.  Initial Review & Psychosocial Screening:  Initial Psych Review & Screening - 02/08/23 1210       Initial Review   Current issues with Current Sleep Concerns   Pt has mild sleep apena but has not been Rx a CPAP     Family Dynamics   Good Support System? Yes   Has daughter for support, and neice     Barriers   Psychosocial barriers to participate in program There are no identifiable barriers or psychosocial needs.      Screening Interventions   Interventions Encouraged to exercise             Quality of Life Scores:  Quality of Life - 02/08/23 1323       Quality of Life   Select Quality of Life      Quality of Life Scores   Health/Function Pre 28.62 %    Socioeconomic Pre 30 %    Psych/Spiritual Pre 30 %    Family Pre 30 %    GLOBAL Pre 29.38 %            Scores of 19 and below usually indicate a poorer quality of life in these areas.  A difference of  2-3 points is a clinically meaningful difference.  A difference of 2-3 points in the total score of the Quality of Life Index has been associated with significant improvement in overall quality of life, self-image, physical symptoms, and general health in studies  assessing change in quality of life.  PHQ-9: Review Flowsheet  More data exists      02/08/2023 04/23/2021 02/16/2021 07/10/2018 06/05/2014  Depression screen PHQ 2/9  Decreased Interest 0 0 0 0 0  Down, Depressed, Hopeless 0 0 0 0 0  PHQ - 2 Score 0 0 0 0 0  Altered sleeping 1 1 3  0 -  Tired, decreased energy 1 1 0 1 -  Change in appetite 0 0 0 0 -  Feeling bad or failure about yourself  0 0 0 0 -  Trouble concentrating 0 0 0 0 -  Moving slowly or fidgety/restless 0 0 0 0 -  Suicidal thoughts 0 0 0 0 -  PHQ-9 Score 2 2 - 1 -  Difficult doing work/chores Not difficult at all Not difficult at all Not difficult at all Not difficult at all -    Details           Interpretation of Total Score  Total Score Depression Severity:  1-4 = Minimal depression, 5-9 = Mild depression, 10-14 = Moderate depression, 15-19 = Moderately severe depression, 20-27 = Severe depression   Psychosocial Evaluation and Intervention:   Psychosocial Re-Evaluation:   Psychosocial Discharge (Final Psychosocial Re-Evaluation):   Vocational Rehabilitation: Provide vocational rehab assistance to qualifying candidates.   Vocational Rehab Evaluation & Intervention:  Vocational Rehab - 02/08/23 1224       Initial Vocational Rehab Evaluation & Intervention   Assessment shows need for Vocational Rehabilitation No   Pt is reitred.  Education: Education Goals: Education classes will be provided on a weekly basis, covering required topics. Participant will state understanding/return demonstration of topics presented.     Core Videos: Exercise    Move It!  Clinical staff conducted group or individual video education with verbal and written material and guidebook.  Patient learns the recommended Pritikin exercise program. Exercise with the goal of living a long, healthy life. Some of the health benefits of exercise include controlled diabetes, healthier blood pressure levels, improved  cholesterol levels, improved heart and lung capacity, improved sleep, and better body composition. Everyone should speak with their doctor before starting or changing an exercise routine.  Biomechanical Limitations Clinical staff conducted group or individual video education with verbal and written material and guidebook.  Patient learns how biomechanical limitations can impact exercise and how we can mitigate and possibly overcome limitations to have an impactful and balanced exercise routine.  Body Composition Clinical staff conducted group or individual video education with verbal and written material and guidebook.  Patient learns that body composition (ratio of muscle mass to fat mass) is a key component to assessing overall fitness, rather than body weight alone. Increased fat mass, especially visceral belly fat, can put [place] at increased risk for metabolic syndrome, type 2 diabetes, heart disease, and even death. It is recommended to combine diet and exercise (cardiovascular and resistance training) to improve your body composition. Seek guidance from your physician and exercise physiologist before implementing an exercise routine.  Exercise Action Plan Clinical staff conducted group or individual video education with verbal and written material and guidebook.  Patient learns the recommended strategies to achieve and enjoy long-term exercise adherence, including variety, self-motivation, self-efficacy, and positive decision making. Benefits of exercise include fitness, good health, weight management, more energy, better sleep, less stress, and overall well-being.  Medical   Heart Disease Risk Reduction Clinical staff conducted group or individual video education with verbal and written material and guidebook.  Patient learns our heart is our most vital organ as it circulates oxygen, nutrients, white blood cells, and hormones throughout the entire body, and carries waste away. Data supports a  plant-based eating plan like the Pritikin Program for its effectiveness in slowing progression of and reversing heart disease. The video provides a number of recommendations to address heart disease.   Metabolic Syndrome and Belly Fat  Clinical staff conducted group or individual video education with verbal and written material and guidebook.  Patient learns what metabolic syndrome is, how it leads to heart disease, and how one can reverse it and keep it from coming back. You have metabolic syndrome if you have 3 of the following 5 criteria: abdominal obesity, high blood pressure, high triglycerides, low HDL cholesterol, and high blood sugar.  Hypertension and Heart Disease Clinical staff conducted group or individual video education with verbal and written material and guidebook.  Patient learns that high blood pressure, or hypertension, is very common in the [place]. Hypertension is largely due to excessive salt intake, but other important risk factors include being overweight, physical inactivity, drinking too much alcohol, smoking, and not eating enough potassium from fruits and vegetables. High blood pressure is a leading risk factor for heart attack, stroke, congestive heart failure, dementia, kidney failure, and premature death. Long-term effects of excessive salt intake include stiffening of the arteries and thickening of heart muscle and organ damage. Recommendations include ways to reduce hypertension and the risk of heart disease.  Diseases of Our Time - Focusing on Diabetes Clinical  staff conducted group or individual video education with verbal and written material and guidebook.  Patient learns why the best way to stop diseases of our time is prevention, through food and other lifestyle changes. Medicine (such as prescription pills and surgeries) is often only a Band-Aid on the problem, not a long-term solution. Most common diseases of our time include obesity, type 2 diabetes,  hypertension, heart disease, and cancer. The Pritikin Program is recommended and has been proven to help reduce, reverse, and/or prevent the damaging effects of metabolic syndrome.  Nutrition   Overview of the Pritikin Eating Plan  Clinical staff conducted group or individual video education with verbal and written material and guidebook.  Patient learns about the Pritikin Eating Plan for disease risk reduction. The Pritikin Eating Plan emphasizes a wide variety of unrefined, minimally-processed carbohydrates, like fruits, vegetables, whole grains, and legumes. Go, Caution, and Stop food choices are explained. Plant-based and lean animal proteins are emphasized. Rationale provided for low sodium intake for blood pressure control, low added sugars for blood sugar stabilization, and low added fats and oils for coronary artery disease risk reduction and weight management.  Calorie Density  Clinical staff conducted group or individual video education with verbal and written material and guidebook.  Patient learns about calorie density and how it impacts the Pritikin Eating Plan. Knowing the characteristics of the food you choose will help you decide whether those foods will lead to weight gain or weight loss, and whether you want to consume more or less of them. Weight loss is usually a side effect of the Pritikin Eating Plan because of its focus on low calorie-dense foods.  Label Reading  Clinical staff conducted group or individual video education with verbal and written material and guidebook.  Patient learns about the Pritikin recommended label reading guidelines and corresponding recommendations regarding calorie density, added sugars, sodium content, and whole grains.  Dining Out - Part 1  Clinical staff conducted group or individual video education with verbal and written material and guidebook.  Patient learns that restaurant meals can be sabotaging because they can be so high in calories, fat,  sodium, and/or sugar. Patient learns recommended strategies on how to positively address this and avoid unhealthy pitfalls.  Facts on Fats  Clinical staff conducted group or individual video education with verbal and written material and guidebook.  Patient learns that lifestyle modifications can be just as effective, if not more so, as many medications for lowering your risk of heart disease. A Pritikin lifestyle can help to reduce your risk of inflammation and atherosclerosis (cholesterol build-up, or plaque, in the artery walls). Lifestyle interventions such as dietary choices and physical activity address the cause of atherosclerosis. A review of the types of fats and their impact on blood cholesterol levels, along with dietary recommendations to reduce fat intake is also included.  Nutrition Action Plan  Clinical staff conducted group or individual video education with verbal and written material and guidebook.  Patient learns how to incorporate Pritikin recommendations into their lifestyle. Recommendations include planning and keeping personal health goals in mind as an important part of their success.  Healthy Mind-Set    Healthy Minds, Bodies, Hearts  Clinical staff conducted group or individual video education with verbal and written material and guidebook.  Patient learns how to identify when they are stressed. Video will discuss the impact of that stress, as well as the many benefits of stress management. Patient will also be introduced to stress management techniques. The way  we think, act, and feel has an impact on our hearts.  How Our Thoughts Can Heal Our Hearts  Clinical staff conducted group or individual video education with verbal and written material and guidebook.  Patient learns that negative thoughts can cause depression and anxiety. This can result in negative lifestyle behavior and serious health problems. Cognitive behavioral therapy is an effective method to help control  our thoughts in order to change and improve our emotional outlook.  Additional Videos:  Exercise    Improving Performance  Clinical staff conducted group or individual video education with verbal and written material and guidebook.  Patient learns to use a non-linear approach by alternating intensity levels and lengths of time spent exercising to help burn more calories and lose more body fat. Cardiovascular exercise helps improve heart health, metabolism, hormonal balance, blood sugar control, and recovery from fatigue. Resistance training improves strength, endurance, balance, coordination, reaction time, metabolism, and muscle mass. Flexibility exercise improves circulation, posture, and balance. Seek guidance from your physician and exercise physiologist before implementing an exercise routine and learn your capabilities and proper form for all exercise.  Introduction to Yoga  Clinical staff conducted group or individual video education with verbal and written material and guidebook.  Patient learns about yoga, a discipline of the coming together of mind, breath, and body. The benefits of yoga include improved flexibility, improved range of motion, better posture and core strength, increased lung function, weight loss, and positive self-image. Yoga's heart health benefits include lowered blood pressure, healthier heart rate, decreased cholesterol and triglyceride levels, improved immune function, and reduced stress. Seek guidance from your physician and exercise physiologist before implementing an exercise routine and learn your capabilities and proper form for all exercise.  Medical   Aging: Enhancing Your Quality of Life  Clinical staff conducted group or individual video education with verbal and written material and guidebook.  Patient learns key strategies and recommendations to stay in good physical health and enhance quality of life, such as prevention strategies, having an advocate,  securing a Health Care Proxy and Power of Attorney, and keeping a list of medications and system for tracking them. It also discusses how to avoid risk for bone loss.  Biology of Weight Control  Clinical staff conducted group or individual video education with verbal and written material and guidebook.  Patient learns that weight gain occurs because we consume more calories than we burn (eating more, moving less). Even if your body weight is normal, you may have higher ratios of fat compared to muscle mass. Too much body fat puts you at increased risk for cardiovascular disease, heart attack, stroke, type 2 diabetes, and obesity-related cancers. In addition to exercise, following the Pritikin Eating Plan can help reduce your risk.  Decoding Lab Results  Clinical staff conducted group or individual video education with verbal and written material and guidebook.  Patient learns that lab test reflects one measurement whose values change over time and are influenced by many factors, including medication, stress, sleep, exercise, food, hydration, pre-existing medical conditions, and more. It is recommended to use the knowledge from this video to become more involved with your lab results and evaluate your numbers to speak with your doctor.   Diseases of Our Time - Overview  Clinical staff conducted group or individual video education with verbal and written material and guidebook.  Patient learns that according to the CDC, 50% to 70% of chronic diseases (such as obesity, type 2 diabetes, elevated lipids, hypertension, and heart  disease) are avoidable through lifestyle improvements including healthier food choices, listening to satiety cues, and increased physical activity.  Sleep Disorders Clinical staff conducted group or individual video education with verbal and written material and guidebook.  Patient learns how good quality and duration of sleep are important to overall health and well-being.  Patient also learns about sleep disorders and how they impact health along with recommendations to address them, including discussing with a physician.  Nutrition  Dining Out - Part 2 Clinical staff conducted group or individual video education with verbal and written material and guidebook.  Patient learns how to plan ahead and communicate in order to maximize their dining experience in a healthy and nutritious manner. Included are recommended food choices based on the type of restaurant the patient is visiting.   Fueling a Banker conducted group or individual video education with verbal and written material and guidebook.  There is a strong connection between our food choices and our health. Diseases like obesity and type 2 diabetes are very prevalent and are in large-part due to lifestyle choices. The Pritikin Eating Plan provides plenty of food and hunger-curbing satisfaction. It is easy to follow, affordable, and helps reduce health risks.  Menu Workshop  Clinical staff conducted group or individual video education with verbal and written material and guidebook.  Patient learns that restaurant meals can sabotage health goals because they are often packed with calories, fat, sodium, and sugar. Recommendations include strategies to plan ahead and to communicate with the manager, chef, or server to help order a healthier meal.  Planning Your Eating Strategy  Clinical staff conducted group or individual video education with verbal and written material and guidebook.  Patient learns about the Pritikin Eating Plan and its benefit of reducing the risk of disease. The Pritikin Eating Plan does not focus on calories. Instead, it emphasizes high-quality, nutrient-rich foods. By knowing the characteristics of the foods, we choose, we can determine their calorie density and make informed decisions.  Targeting Your Nutrition Priorities  Clinical staff conducted group or individual  video education with verbal and written material and guidebook.  Patient learns that lifestyle habits have a tremendous impact on disease risk and progression. This video provides eating and physical activity recommendations based on your personal health goals, such as reducing LDL cholesterol, losing weight, preventing or controlling type 2 diabetes, and reducing high blood pressure.  Vitamins and Minerals  Clinical staff conducted group or individual video education with verbal and written material and guidebook.  Patient learns different ways to obtain key vitamins and minerals, including through a recommended healthy diet. It is important to discuss all supplements you take with your doctor.   Healthy Mind-Set    Smoking Cessation  Clinical staff conducted group or individual video education with verbal and written material and guidebook.  Patient learns that cigarette smoking and tobacco addiction pose a serious health risk which affects millions of people. Stopping smoking will significantly reduce the risk of heart disease, lung disease, and many forms of cancer. Recommended strategies for quitting are covered, including working with your doctor to develop a successful plan.  Culinary   Becoming a Set designer conducted group or individual video education with verbal and written material and guidebook.  Patient learns that cooking at home can be healthy, cost-effective, quick, and puts them in control. Keys to cooking healthy recipes will include looking at your recipe, assessing your equipment needs, planning ahead, making it simple,  choosing cost-effective seasonal ingredients, and limiting the use of added fats, salts, and sugars.  Cooking - Breakfast and Snacks  Clinical staff conducted group or individual video education with verbal and written material and guidebook.  Patient learns how important breakfast is to satiety and nutrition through the entire day.  Recommendations include key foods to eat during breakfast to help stabilize blood sugar levels and to prevent overeating at meals later in the day. Planning ahead is also a key component.  Cooking - Educational psychologist conducted group or individual video education with verbal and written material and guidebook.  Patient learns eating strategies to improve overall health, including an approach to cook more at home. Recommendations include thinking of animal protein as a side on your plate rather than center stage and focusing instead on lower calorie dense options like vegetables, fruits, whole grains, and plant-based proteins, such as beans. Making sauces in large quantities to freeze for later and leaving the skin on your vegetables are also recommended to maximize your experience.  Cooking - Healthy Salads and Dressing Clinical staff conducted group or individual video education with verbal and written material and guidebook.  Patient learns that vegetables, fruits, whole grains, and legumes are the foundations of the Pritikin Eating Plan. Recommendations include how to incorporate each of these in flavorful and healthy salads, and how to create homemade salad dressings. Proper handling of ingredients is also covered. Cooking - Soups and [employer] - Soups and Desserts Clinical staff conducted group or individual video education with verbal and written material and guidebook.  Patient learns that Pritikin soups and desserts make for easy, nutritious, and delicious snacks and meal components that are low in sodium, fat, sugar, and calorie density, while high in vitamins, minerals, and filling fiber. Recommendations include simple and healthy ideas for soups and desserts.   Overview     The Pritikin Solution Program Overview Clinical staff conducted group or individual video education with verbal and written material and guidebook.  Patient learns that the results of the Pritikin  Program have been documented in more than 100 articles published in peer-reviewed journals, and the benefits include reducing risk factors for (and, in some cases, even reversing) high cholesterol, high blood pressure, type 2 diabetes, obesity, and more! An overview of the three key pillars of the Pritikin Program will be covered: eating well, doing regular exercise, and having a healthy mind-set.  WORKSHOPS  Exercise: Exercise Basics: Building Your Action Plan Clinical staff led group instruction and group discussion with PowerPoint presentation and patient guidebook. To enhance the learning environment the use of posters, models and videos may be added. At the conclusion of this workshop, patients will comprehend the difference between physical activity and exercise, as well as the benefits of incorporating both, into their routine. Patients will understand the FITT (Frequency, Intensity, Time, and Type) principle and how to use it to build an exercise action plan. In addition, safety concerns and other considerations for exercise and cardiac rehab will be addressed by the presenter. The purpose of this lesson is to promote a comprehensive and effective weekly exercise routine in order to improve patients' overall level of fitness.   Managing Heart Disease: Your Path to a Healthier Heart Clinical staff led group instruction and group discussion with PowerPoint presentation and patient guidebook. To enhance the learning environment the use of posters, models and videos may be added.At the conclusion of this workshop, patients will understand the anatomy and physiology  of the heart. Additionally, they will understand how Pritikin's three pillars impact the risk factors, the progression, and the management of heart disease.  The purpose of this lesson is to provide a high-level overview of the heart, heart disease, and how the Pritikin lifestyle positively impacts risk factors.  Exercise  Biomechanics Clinical staff led group instruction and group discussion with PowerPoint presentation and patient guidebook. To enhance the learning environment the use of posters, models and videos may be added. Patients will learn how the structural parts of their bodies function and how these functions impact their daily activities, movement, and exercise. Patients will learn how to promote a neutral spine, learn how to manage pain, and identify ways to improve their physical movement in order to promote healthy living. The purpose of this lesson is to expose patients to common physical limitations that impact physical activity. Participants will learn practical ways to adapt and manage aches and pains, and to minimize their effect on regular exercise. Patients will learn how to maintain good posture while sitting, walking, and lifting.  Balance Training and Fall Prevention  Clinical staff led group instruction and group discussion with PowerPoint presentation and patient guidebook. To enhance the learning environment the use of posters, models and videos may be added. At the conclusion of this workshop, patients will understand the importance of their sensorimotor skills (vision, proprioception, and the vestibular system) in maintaining their ability to balance as they age. Patients will apply a variety of balancing exercises that are appropriate for their current level of function. Patients will understand the common causes for poor balance, possible solutions to these problems, and ways to modify their physical environment in order to minimize their fall risk. The purpose of this lesson is to teach patients about the importance of maintaining balance as they age and ways to minimize their risk of falling.  WORKSHOPS   Nutrition:  Fueling a Ship broker led group instruction and group discussion with PowerPoint presentation and patient guidebook. To enhance the learning  environment the use of posters, models and videos may be added. Patients will review the foundational principles of the Pritikin Eating Plan and understand what constitutes a serving size in each of the food groups. Patients will also learn Pritikin-friendly foods that are better choices when away from home and review make-ahead meal and snack options. Calorie density will be reviewed and applied to three nutrition priorities: weight maintenance, weight loss, and weight gain. The purpose of this lesson is to reinforce (in a group setting) the key concepts around what patients are recommended to eat and how to apply these guidelines when away from home by planning and selecting Pritikin-friendly options. Patients will understand how calorie density may be adjusted for different weight management goals.  Mindful Eating  Clinical staff led group instruction and group discussion with PowerPoint presentation and patient guidebook. To enhance the learning environment the use of posters, models and videos may be added. Patients will briefly review the concepts of the Pritikin Eating Plan and the importance of low-calorie dense foods. The concept of mindful eating will be introduced as well as the importance of paying attention to internal hunger signals. Triggers for non-hunger eating and techniques for dealing with triggers will be explored. The purpose of this lesson is to provide patients with the opportunity to review the basic principles of the Pritikin Eating Plan, discuss the value of eating mindfully and how to measure internal cues of hunger and fullness using the Hunger Scale.  Patients will also discuss reasons for non-hunger eating and learn strategies to use for controlling emotional eating.  Targeting Your Nutrition Priorities Clinical staff led group instruction and group discussion with PowerPoint presentation and patient guidebook. To enhance the learning environment the use of posters, models and  videos may be added. Patients will learn how to determine their genetic susceptibility to disease by reviewing their family history. Patients will gain insight into the importance of diet as part of an overall healthy lifestyle in mitigating the impact of genetics and other environmental insults. The purpose of this lesson is to provide patients with the opportunity to assess their personal nutrition priorities by looking at their family history, their own health history and current risk factors. Patients will also be able to discuss ways of prioritizing and modifying the Pritikin Eating Plan for their highest risk areas  Menu  Clinical staff led group instruction and group discussion with PowerPoint presentation and patient guidebook. To enhance the learning environment the use of posters, models and videos may be added. Using menus brought in from [employer], or printed from [employer], patients will apply the Pritikin dining out guidelines that were presented in the [employer] video. Patients will also be able to practice these guidelines in a variety of provided scenarios. The purpose of this lesson is to provide patients with the opportunity to practice hands-on learning of the Pritikin Dining Out guidelines with actual menus and practice scenarios.  Label Reading Clinical staff led group instruction and group discussion with PowerPoint presentation and patient guidebook. To enhance the learning environment the use of posters, models and videos may be added. Patients will review and discuss the Pritikin label reading guidelines presented in Pritikin's Label Reading Educational series video. Using fool labels brought in from local grocery stores and markets, patients will apply the label reading guidelines and determine if the packaged food meet the Pritikin guidelines. The purpose of this lesson is to provide patients with the opportunity to review, discuss, and practice  hands-on learning of the Pritikin Label Reading guidelines with actual packaged food labels. Cooking School  Pritikin's [employer] are designed to teach patients ways to prepare quick, simple, and affordable recipes at home. The importance of nutrition's role in chronic disease risk reduction is reflected in its emphasis in the overall Pritikin program. By learning how to prepare essential core Pritikin Eating Plan recipes, patients will increase control over what they eat; be able to customize the flavor of foods without the use of added salt, sugar, or fat; and improve the quality of the food they consume. By learning a set of core recipes which are easily assembled, quickly prepared, and affordable, patients are more likely to prepare more healthy foods at home. These workshops focus on convenient breakfasts, simple entres, side dishes, and desserts which can be prepared with minimal effort and are consistent with nutrition recommendations for cardiovascular risk reduction. Cooking [employer] are taught by a Armed forces logistics/support/administrative officer (RD) who has been trained by the [employer]. The chef or RD has a clear understanding of the importance of minimizing - if not completely eliminating - added fat, sugar, and sodium in recipes. Throughout the series of Cooking School Workshop sessions, patients will learn about healthy ingredients and efficient methods of cooking to build confidence in their capability to prepare    Cooking School weekly topics:  Adding Flavor- Sodium-Free  Fast and Healthy Breakfasts  Powerhouse Plant-Based Proteins  Satisfying  Salads and Dressings  Simple Sides and Sauces  International Cuisine-Spotlight on the [employer] Zones  Delicious Desserts  Savory Soups  [employer] - Meals in a Snap  Tasty Appetizers and Snacks  Comforting Weekend Breakfasts  One-Pot Wonders   Fast Evening Meals  Landscape architect Your Pritikin  Plate  WORKSHOPS   Healthy Mindset (Psychosocial):  Focused Goals, Sustainable Changes Clinical staff led group instruction and group discussion with PowerPoint presentation and patient guidebook. To enhance the learning environment the use of posters, models and videos may be added. Patients will be able to apply effective goal setting strategies to establish at least one personal goal, and then take consistent, meaningful action toward that goal. They will learn to identify common barriers to achieving personal goals and develop strategies to overcome them. Patients will also gain an understanding of how our mind-set can impact our ability to achieve goals and the importance of cultivating a positive and growth-oriented mind-set. The purpose of this lesson is to provide patients with a deeper understanding of how to set and achieve personal goals, as well as the tools and strategies needed to overcome common obstacles which may arise along the way.  From Head to Heart: The Power of a Healthy Outlook  Clinical staff led group instruction and group discussion with PowerPoint presentation and patient guidebook. To enhance the learning environment the use of posters, models and videos may be added. Patients will be able to recognize and describe the impact of emotions and mood on physical health. They will discover the importance of self-care and explore self-care practices which may work for them. Patients will also learn how to utilize the 4 C's to cultivate a healthier outlook and better manage stress and challenges. The purpose of this lesson is to demonstrate to patients how a healthy outlook is an essential part of maintaining good health, especially as they continue their cardiac rehab journey.  Healthy Sleep for a Healthy Heart Clinical staff led group instruction and group discussion with PowerPoint presentation and patient guidebook. To enhance the learning environment the use of posters,  models and videos may be added. At the conclusion of this workshop, patients will be able to demonstrate knowledge of the importance of sleep to overall health, well-being, and quality of life. They will understand the symptoms of, and treatments for, common sleep disorders. Patients will also be able to identify daytime and nighttime behaviors which impact sleep, and they will be able to apply these tools to help manage sleep-related challenges. The purpose of this lesson is to provide patients with a general overview of sleep and outline the importance of quality sleep. Patients will learn about a few of the most common sleep disorders. Patients will also be introduced to the concept of "sleep hygiene," and discover ways to self-manage certain sleeping problems through simple daily behavior changes. Finally, the workshop will motivate patients by clarifying the links between quality sleep and their goals of heart-healthy living.   Recognizing and Reducing Stress Clinical staff led group instruction and group discussion with PowerPoint presentation and patient guidebook. To enhance the learning environment the use of posters, models and videos may be added. At the conclusion of this workshop, patients will be able to understand the types of stress reactions, differentiate between acute and chronic stress, and recognize the impact that chronic stress has on their health. They will also be able to apply different coping mechanisms, such as reframing negative self-talk. Patients will have the opportunity to  practice a variety of stress management techniques, such as deep abdominal breathing, progressive muscle relaxation, and/or guided imagery.  The purpose of this lesson is to educate patients on the role of stress in their lives and to provide healthy techniques for coping with it.  Learning Barriers/Preferences:  Learning Barriers/Preferences - 02/08/23 1324       Learning Barriers/Preferences   Learning  Barriers Sight   wears glasses   Learning Preferences Audio;Computer/Internet;Group Instruction;Individual Instruction;Pictoral;Skilled Demonstration;Verbal Instruction;Video             Education Topics:  Knowledge Questionnaire Score:  Knowledge Questionnaire Score - 02/08/23 1325       Knowledge Questionnaire Score   Pre Score 22/24             Core Components/Risk Factors/Patient Goals at Admission:  Personal Goals and Risk Factors at Admission - 02/08/23 1224       Core Components/Risk Factors/Patient Goals on Admission    Weight Management Yes;Weight Loss    Intervention Weight Management: Develop a combined nutrition and exercise program designed to reach desired caloric intake, while maintaining appropriate intake of nutrient and fiber, sodium and fats, and appropriate energy expenditure required for the weight goal.;Weight Management: Provide education and appropriate resources to help participant work on and attain dietary goals.;Weight Management/Obesity: Establish reasonable short term and long term weight goals.    Admit Weight 175 lb 14.8 oz (79.8 kg)    Expected Outcomes Weight Loss: Understanding of general recommendations for a balanced deficit meal plan, which promotes 1-2 lb weight loss per week and includes a negative energy balance of [phone] kcal/d;Short Term: Continue to assess and modify interventions until short term weight is achieved;Long Term: Adherence to nutrition and physical activity/exercise program aimed toward attainment of established weight goal;Understanding of distribution of calorie intake throughout the day with the consumption of 4-5 meals/snacks;Understanding recommendations for meals to include 15-35% energy as protein, 25-35% energy from fat, 35-60% energy from carbohydrates, less than 200mg  of dietary cholesterol, 20-35 gm of total fiber daily    Hypertension Yes    Intervention Provide education on lifestyle modifcations including  regular physical activity/exercise, weight management, moderate sodium restriction and increased consumption of fresh fruit, vegetables, and low fat dairy, alcohol moderation, and smoking cessation.;Monitor prescription use compliance.    Expected Outcomes Short Term: Continued assessment and intervention until BP is < 140/48mm HG in hypertensive participants. < 130/88mm HG in hypertensive participants with diabetes, heart failure or chronic kidney disease.;Long Term: Maintenance of blood pressure at goal levels.    Lipids Yes    Intervention Provide education and support for participant on nutrition & aerobic/resistive exercise along with prescribed medications to achieve LDL 70mg , HDL >40mg .    Expected Outcomes Short Term: Participant states understanding of desired cholesterol values and is compliant with medications prescribed. Participant is following exercise prescription and nutrition guidelines.;Long Term: Cholesterol controlled with medications as prescribed, with individualized exercise RX and with personalized nutrition plan. Value goals: LDL < 70mg , HDL > 40 mg.             Core Components/Risk Factors/Patient Goals Review:    Core Components/Risk Factors/Patient Goals at Discharge (Final Review):    ITP Comments:  ITP Comments     Row Name 02/08/23 1204           ITP Comments Armanda Magic, MD: Medical Director.  Intorduction to the [facility] / Intensive Cardiac Rehab.  Initial orientation packet reviewed with the patient.  Comments: Participant attended orientation for the cardiac rehabilitation program on  02/08/2023  to perform initial intake and exercise walk test. Patient introduced to the [facility] education and orientation packet was reviewed. Completed 6-minute walk test, measurements, initial ITP, and exercise prescription. Vital signs stable. Telemetry-normal sinus rhythm, frequent PVC's, asymptomatic.   Service time was  from 10:24 to 12:30.

## 2023-02-08 NOTE — Progress Notes (Signed)
Cardiac Rehab Medication Review   Does the patient  feel that his/her medications are working for him/her?  yes  Has the patient been experiencing any side effects to the medications prescribed?  no  Does the patient measure his/her own blood pressure or blood glucose at home?  no   Does the patient have any problems obtaining medications due to transportation or finances?   no  Understanding of regimen: excellent Understanding of indications: excellent Potential of compliance: excellent    Comments: Pt is not currently checking blood pressure at home but plans to start checking it again. No questions regarding her medications.     Lorin Picket 02/08/2023 3:42 PM

## 2023-02-08 NOTE — Telephone Encounter (Signed)
Pt calling in bc she had a stent showed mild pulmonary hypertension , hasn't started stiolto yet and starting cardiac rehab set 9th for 12weeks. Breathing seems to be better and wants to know Dr. Delton Coombes thoughts on everything

## 2023-02-09 ENCOUNTER — Other Ambulatory Visit (HOSPITAL_COMMUNITY): Payer: Self-pay

## 2023-02-10 ENCOUNTER — Ambulatory Visit (INDEPENDENT_AMBULATORY_CARE_PROVIDER_SITE_OTHER): Payer: Medicare Other | Admitting: Podiatry

## 2023-02-10 DIAGNOSIS — L600 Ingrowing nail: Secondary | ICD-10-CM | POA: Diagnosis not present

## 2023-02-10 NOTE — Telephone Encounter (Signed)
I discussed the patient's status and her plans for cardiac rehab.  She underwent cardiac catheterization and a stent placed recently.  It has helped her dyspnea.  She has not started [employer].  I asked her to make her interventions insteps so she can judge whether she gets benefit.  She will try adding albuterol prior to her cardiac rehab workouts to see if this helps her exercise tolerance.  Later once she is at baseline she led to [facility], judge it after about 2 weeks to see if this boost her functional capacity further.  She'll make a follow-up appointment with me in 6 months.

## 2023-02-14 ENCOUNTER — Encounter (HOSPITAL_COMMUNITY)
Admission: RE | Admit: 2023-02-14 | Discharge: 2023-02-14 | Disposition: A | Discharge status: Home / Self Care | Payer: Medicare Other | Source: Ambulatory Visit | Attending: Interventional Cardiology

## 2023-02-14 DIAGNOSIS — Z955 Presence of coronary angioplasty implant and graft: Secondary | ICD-10-CM | POA: Diagnosis not present

## 2023-02-14 DIAGNOSIS — Z48812 Encounter for surgical aftercare following surgery on the circulatory system: Secondary | ICD-10-CM | POA: Diagnosis not present

## 2023-02-14 DIAGNOSIS — J449 Chronic obstructive pulmonary disease, unspecified: Secondary | ICD-10-CM

## 2023-02-14 NOTE — Progress Notes (Signed)
Daily Session Note  Patient Details  Name: [name] [name] MRN: [id number] Date of Birth: [birth year] Referring Provider:   Flowsheet Row INTENSIVE CARDIAC REHAB ORIENT from 02/08/2023 in [facility]  Referring Provider Lance Muss, MD       Encounter Date: 02/14/2023  Check In:  Session Check In - 02/14/23 1256       Check-In   Supervising physician immediately available to respond to emergencies [facility] MD immediately available    Physician(s) Joni Reining, NP    Location [facility]    Staff Present Lorin Picket, MS, ACSM-CEP, CCRP, Exercise Physiologist;Jetta Dan Humphreys BS, ACSM-CEP, Exercise Physiologist;Olinty Peggye Pitt, MS, ACSM-CEP, Exercise Physiologist;Johnny Hale Bogus, MS, Exercise Physiologist;Darothy Courtright, RN, BSN    Virtual Visit No    Medication changes reported     No    Fall or balance concerns reported    No    Tobacco Cessation No Change    Warm-up and Cool-down Performed as group-led instruction   CRP2 orientation today   Resistance Training Performed Yes    VAD Patient? No    PAD/SET Patient? No      Pain Assessment   Currently in Pain? No/denies    Pain Score 0-No pain    Pain Onset 1 to 4 weeks ago    Multiple Pain Sites No             Capillary Blood Glucose: No results found for this or any previous visit (from the past 24 hour(s)).   Exercise Prescription Changes - 02/14/23 1400       Response to Exercise   Blood Pressure (Admit) 130/60    Blood Pressure (Exercise) 156/84    Blood Pressure (Exit) 112/62    Heart Rate (Admit) 97 bpm    Heart Rate (Exercise) 115 bpm    Heart Rate (Exit) 100 bpm    Oxygen Saturation (Admit) 95 %    Oxygen Saturation (Exercise) 98 %    Oxygen Saturation (Exit) 96 %    Rating of Perceived Exertion (Exercise) 12    Perceived Dyspnea (Exercise) 1    Symptoms SOB, RPD = 1    Comments Pt's first day in the CRP2 program     Duration Continue with 30 min of aerobic exercise without signs/symptoms of physical distress.    Intensity THRR unchanged      Progression   Progression Continue to progress workloads to maintain intensity without signs/symptoms of physical distress.    Average METs 2.3      Resistance Training   Training Prescription Yes    Weight 3 lbs    Reps 10-15    Time 10 Minutes      Interval Training   Interval Training No      NuStep   Level 2    SPM 90    Minutes 15    METs 2.5      Recumbant Elliptical   Level 2    RPM 51    Watts 64    Minutes 15    METs 2.1             Social History   Tobacco Use  Smoking Status Former   Current packs/day: 0.00   Average packs/day: 0.5 packs/day for 40.0 years (20.0 ttl pk-yrs)   Types: Cigarettes   Start date: 03/17/1973   Quit date: 03/17/2013   Years since quitting: 9.9  Smokeless Tobacco Never    Goals  Met:  Exercise tolerated well No report of concerns or symptoms today Strength training completed today  Goals Unmet:  Not Applicable  Comments: Pt started cardiac rehab today.  Pt tolerated light exercise without difficulty. VSS, telemetry-Sinus Rhythm, asymptomatic.  Medication list reconciled. Pt denies barriers to medicaiton compliance.  PSYCHOSOCIAL ASSESSMENT:  PHQ-2. Pt exhibits positive coping skills, hopeful outlook with supportive family. No psychosocial needs identified at this time, no psychosocial interventions necessary.    Pt enjoys reading and gardening.   Pt oriented to exercise equipment and routine.    Understanding verbalized. Thayer Headings RN BSN    Dr. Armanda Magic is Medical Director for Cardiac Rehab at [facility].

## 2023-02-15 ENCOUNTER — Ambulatory Visit
Admission: RE | Admit: 2023-02-15 | Discharge: 2023-02-15 | Disposition: A | Discharge status: Home / Self Care | Payer: Medicare Other | Source: Ambulatory Visit | Attending: Internal Medicine | Admitting: Internal Medicine

## 2023-02-15 ENCOUNTER — Inpatient Hospital Stay: Payer: Medicare Other | Attending: Internal Medicine

## 2023-02-15 DIAGNOSIS — Z08 Encounter for follow-up examination after completed treatment for malignant neoplasm: Secondary | ICD-10-CM | POA: Diagnosis not present

## 2023-02-15 DIAGNOSIS — J439 Emphysema, unspecified: Secondary | ICD-10-CM | POA: Diagnosis not present

## 2023-02-15 DIAGNOSIS — Z85118 Personal history of other malignant neoplasm of bronchus and lung: Secondary | ICD-10-CM | POA: Diagnosis not present

## 2023-02-15 DIAGNOSIS — C3490 Malignant neoplasm of unspecified part of unspecified bronchus or lung: Secondary | ICD-10-CM

## 2023-02-15 DIAGNOSIS — I700 Atherosclerosis of aorta: Secondary | ICD-10-CM | POA: Diagnosis not present

## 2023-02-15 LAB — CBC WITH DIFFERENTIAL (CANCER CENTER ONLY)
Abs Immature Granulocytes: 0.01 10*3/uL (ref 0.00–0.07)
Basophils Absolute: 0.1 10*3/uL (ref 0.0–0.1)
Basophils Relative: 1 %
Eosinophils Absolute: 0.2 10*3/uL (ref 0.0–0.5)
Eosinophils Relative: 3 %
HCT: 41 % (ref 36.0–46.0)
Hemoglobin: 13.7 g/dL (ref 12.0–15.0)
Immature Granulocytes: 0 %
Lymphocytes Relative: 21 %
Lymphs Abs: 1.3 10*3/uL (ref 0.7–4.0)
MCH: 30.5 pg (ref 26.0–34.0)
MCHC: 33.4 g/dL (ref 30.0–36.0)
MCV: 91.3 fL (ref 80.0–100.0)
Monocytes Absolute: 0.5 10*3/uL (ref 0.1–1.0)
Monocytes Relative: 8 %
Neutro Abs: 4.2 10*3/uL (ref 1.7–7.7)
Neutrophils Relative %: 67 %
Platelet Count: 272 10*3/uL (ref 150–400)
RBC: 4.49 MIL/uL (ref 3.87–5.11)
RDW: 13 % (ref 11.5–15.5)
WBC Count: 6.2 10*3/uL (ref 4.0–10.5)
nRBC: 0 % (ref 0.0–0.2)

## 2023-02-15 LAB — CMP (CANCER CENTER ONLY)
ALT: 15 U/L (ref 0–44)
AST: 19 U/L (ref 15–41)
Albumin: 4.1 g/dL (ref 3.5–5.0)
Alkaline Phosphatase: 87 U/L (ref 38–126)
Anion gap: 5 (ref 5–15)
BUN: 18 mg/dL (ref 8–23)
CO2: 27 mmol/L (ref 22–32)
Calcium: 9.4 mg/dL (ref 8.9–10.3)
Chloride: 109 mmol/L (ref 98–111)
Creatinine: 0.87 mg/dL (ref 0.44–1.00)
GFR, Estimated: >60 mL/min (ref >60)
Glucose, Bld: 105 mg/dL — ABNORMAL HIGH (ref 70–99)
Potassium: 4.4 mmol/L (ref 3.5–5.1)
Sodium: 141 mmol/L (ref 135–145)
Total Bilirubin: 0.7 mg/dL (ref 0.3–1.2)
Total Protein: 6.9 g/dL (ref 6.5–8.1)

## 2023-02-15 MED ORDER — IOPAMIDOL (ISOVUE-300) INJECTION 61%
200.0000 mL | Freq: Once | INTRAVENOUS | Status: AC | PRN
  Administered 2023-02-15: 75 mL via INTRAVENOUS

## 2023-02-16 ENCOUNTER — Encounter (HOSPITAL_COMMUNITY)
Admission: RE | Admit: 2023-02-16 | Discharge: 2023-02-16 | Disposition: A | Discharge status: Home / Self Care | Payer: Medicare Other | Source: Ambulatory Visit | Attending: Interventional Cardiology | Admitting: Interventional Cardiology

## 2023-02-16 DIAGNOSIS — Z955 Presence of coronary angioplasty implant and graft: Secondary | ICD-10-CM

## 2023-02-16 DIAGNOSIS — Z48812 Encounter for surgical aftercare following surgery on the circulatory system: Secondary | ICD-10-CM | POA: Diagnosis not present

## 2023-02-16 NOTE — Progress Notes (Signed)
Subjective: No chief complaint on file.   67 year old female presents the office today for follow-up evaluation after undergoing nail removal with Dr. Charlsie Merles.  She is starting cardiac rehab next week and she wants to have her toe checked.  Has not present pain is doing better.  Does not report any drainage or pus.  No fevers or chills.  She has no new concerns otherwise.   Objective: AAO x3, NAD DP/PT pulses palpable bilaterally, CRT less than 3 seconds Status post nail removal.  There was swelling on the procedure site on the nailbed.  There is still some localized edema and erythema on the proximal nail fold but thinks is more inflammation as opposed to infection.  There is no drainage or pus noted today there is no ascending cellulitis.  There is no fluctuation or crepitation. No pain with calf compression, swelling, warmth, erythema  Assessment: Status post nail removal with localized infection with improvement  Plan: -All treatment options discussed with the patient including all alternatives, risks, complications.  Overall doing better.  She did complete the course of antibiotics.  I would still recommend to continue soaking Epsom salts and antibiotic ointment and a bandage in the day but leave the area open at nighttime. -Monitor for any signs or symptoms of infection or reoccurrence.  The area is not healed the next couple weeks on this now or sooner if there is any worsening or changes.  Return if symptoms worsen or fail to improve.  Vivi Barrack DPM

## 2023-02-17 ENCOUNTER — Inpatient Hospital Stay (HOSPITAL_BASED_OUTPATIENT_CLINIC_OR_DEPARTMENT_OTHER): Payer: Medicare Other | Admitting: Internal Medicine

## 2023-02-17 VITALS — BP 121/66 | HR 74 | Temp 97.6°F | Resp 15 | Ht 66.0 in | Wt 174.3 lb

## 2023-02-17 DIAGNOSIS — Z08 Encounter for follow-up examination after completed treatment for malignant neoplasm: Secondary | ICD-10-CM | POA: Diagnosis not present

## 2023-02-17 DIAGNOSIS — C3490 Malignant neoplasm of unspecified part of unspecified bronchus or lung: Secondary | ICD-10-CM

## 2023-02-17 DIAGNOSIS — Z85118 Personal history of other malignant neoplasm of bronchus and lung: Secondary | ICD-10-CM | POA: Diagnosis not present

## 2023-02-17 NOTE — Progress Notes (Signed)
[facility] [phone]   [phone]  OFFICE PROGRESS NOTE  Alysia Penna, MD [address] [place] [address]  DIAGNOSIS: Primary cancer of right lower lobe of lung   Staging form: Lung, AJCC 7th Edition     Clinical: Stage IIIA (T2a, N2, M0) - Unsigned       Staging comments: Adenocarcinoma  Foundation 1 molecular studies: positive for [id number], KRASG13C, [id number]*, [id number]*11, GATA6 amplification and [id number]. Negative for BRAF, ALK, MET, RET and ERBB2  PRIOR THERAPY:  1) Concurrent chemoradiation with chemotherapy in the form of weekly carboplatin for an AUC of 2 and paclitaxel 45 mg/m given concurrent with radiation 2) Right VATS (video-assisted thoracoscopic surgery) with right lower lobectomy and mediastinal lymph node dissection under the care of Dr. Donata Clay on 07/23/2014. The final pathology showed residual 1.8 cm moderate to poorly differentiated invasive adenocarcinoma with no pleural or lymphovascular invasion. The dissected lymph nodes were negative for malignancy and the final pathologic stage was (pT1a, pN0, pMx).  CURRENT THERAPY: Observation.   INTERVAL HISTORY: [name] [name] 67 y.o. female returns to the clinic today for annual follow-up visit.  The patient is feeling fine today with no concerning complaints.  She had a stent placed 2 weeks ago and currently on Plavix.  She is undergoing cardiac rehab.  She denied having any current chest pain, shortness of breath, cough or hemoptysis.  She has no nausea, vomiting, diarrhea or constipation.  She has no headache or visual changes.  She denied having any recent weight loss or night sweats.  She had repeat CT scan of the chest performed recently and she is here for evaluation and discussion of her scan results.  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 she was given albuterol inhaler & had steroid with surg.     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, transient, takes Verapimil for migraine & BP control    Mild obstructive sleep apnea 07/28/2021   Pain of right thumb 06/24/2017   Plantar fasciitis of left foot 03/12/2022   PONV (postoperative nausea and vomiting)    Primary cancer of right lower lobe of lung 04/17/2014   Shortness of breath 09/17/2019   Sprain of lateral ligament of ankle joint 09/11/2021   Subjective memory complaints 07/16/2022   Tenosynovitis of both hands 12/04/2021   Thyroid nodule    benign by biopsy   Tightness of left gastrocnemius muscle 03/12/2022   Transient visual loss 01/25/2012   Varicose veins    left leg   Vitamin D deficiency    Wears glasses     ALLERGIES:  is allergic to advil [ibuprofen], amoxicillin-pot clavulanate, carvedilol, codeine, hydrocodone-guaifenesin, sulfa antibiotics, temazepam, plasticized base [plastibase], and zofran [ondansetron hcl].  MEDICATIONS:  Current Outpatient Medications  Medication Sig Dispense Refill   aspirin EC 81 MG tablet Take 1 tablet (81 mg total) by mouth daily. Swallow whole. 30 tablet 12   clopidogrel (PLAVIX) 75 MG tablet Take 1 tablet (75 mg total) by mouth daily. 90 tablet 1   loratadine (CLARITIN) 10 MG tablet Take 10 mg by mouth daily as needed for rhinitis or allergies.      neomycin-bacitracin-polymyxin (NEOSPORIN) [id number] ointment Apply 1 Application topically daily as needed (wound care).     nitroGLYCERIN (NITROSTAT) 0.4 MG SL tablet Place 1 tablet (0.4 mg total) under the tongue every 5 (five) minutes as needed for chest pain. 25 tablet 6   rosuvastatin (CRESTOR) 20 MG tablet Take 1 tablet (20 mg total) by mouth daily.     sacubitril-valsartan (ENTRESTO) 49-51 MG Take 1 tablet by mouth 2 (two) times daily. 180 tablet 3   Suvorexant (BELSOMRA) 10 MG TABS Take 1 tablet (10 mg total) by mouth at bedtime as needed. 30 tablet 2   Tiotropium Bromide-Olodaterol (STIOLTO RESPIMAT) 2.5-2.5 MCG/ACT AERS Inhale 2 puffs into the lungs daily.     VENTOLIN HFA 108 (90 Base) MCG/ACT inhaler INHALE 1 PUFF BY MOUTH THREE TIMES DAILY AS NEEDED FOR WHEEZING FOR SHORTNESS OF BREATH 18 g 0   Vitamin D, Ergocalciferol, (DRISDOL) 50000 units CAPS capsule Take 50,000 Units by mouth every 7 (seven) days.     No current facility-administered medications for this visit.    SURGICAL HISTORY:  Past Surgical History:  Procedure Laterality Date   CARPAL TUNNEL RELEASE Left    CESAREAN SECTION     COLONOSCOPY W/ BIOPSIES AND POLYPECTOMY     CORONARY STENT INTERVENTION N/A 01/26/2023   Procedure: CORONARY STENT INTERVENTION;  Surgeon: Corky Crafts, MD;  Location: [facility] INVASIVE CV LAB;  Service: Cardiovascular;  Laterality: N/A;   DILATION AND CURETTAGE OF UTERUS     RIGHT/LEFT HEART CATH AND CORONARY ANGIOGRAPHY N/A 01/26/2023   Procedure: RIGHT/LEFT HEART CATH AND CORONARY ANGIOGRAPHY;  Surgeon: Corky Crafts, MD;  Location: [facility] INVASIVE CV LAB;  Service: Cardiovascular;  Laterality: N/A;   TONSILLECTOMY     VARICOSE VEIN SURGERY     left   VIDEO ASSISTED THORACOSCOPY (VATS)/ LOBECTOMY Right 07/23/2014   Procedure: VIDEO ASSISTED THORACOSCOPY (VATS)/ LOBECTOMY;  Surgeon: Kerin Perna, MD;  Location: [facility] OR;  Service: Thoracic;  Laterality: Right;   VIDEO BRONCHOSCOPY N/A  07/23/2014   Procedure: VIDEO BRONCHOSCOPY;  Surgeon: Kerin Perna, MD;  Location: [facility] OR;  Service: Thoracic;  Laterality: N/A;   VIDEO BRONCHOSCOPY WITH ENDOBRONCHIAL ULTRASOUND Right 04/17/2014   Procedure: VIDEO BRONCHOSCOPY WITH ENDOBRONCHIAL ULTRASOUND;  Surgeon: Leslye Peer, MD;  Location: [facility] OR;  Service: Thoracic;  Laterality: Right;   VIDEO BRONCHOSCOPY WITH ENDOBRONCHIAL ULTRASOUND N/A 07/08/2014   Procedure: VIDEO BRONCHOSCOPY WITH ENDOBRONCHIAL ULTRASOUND;  Surgeon: Kerin Perna, MD;  Location: [facility] OR;  Service: Thoracic;  Laterality: N/A;    REVIEW OF SYSTEMS:  A comprehensive review of systems was negative.   PHYSICAL EXAMINATION: General appearance: alert, cooperative and no distress Head: Normocephalic, without obvious abnormality, atraumatic Neck: no adenopathy, no JVD, supple, symmetrical, trachea midline and thyroid not enlarged, symmetric, no tenderness/mass/nodules Lymph nodes: Cervical, supraclavicular, and axillary nodes normal. Resp: clear to auscultation bilaterally Back: symmetric, no curvature. ROM normal. No CVA tenderness. Cardio: regular rate and rhythm, S1, S2 normal, no murmur, click, rub or gallop GI: soft, non-tender; bowel sounds normal; no masses,  no organomegaly Extremities: extremities  normal, atraumatic, no cyanosis or edema  ECOG PERFORMANCE STATUS: 0 - Asymptomatic  Blood pressure 121/66, pulse 74, temperature 97.6 F (36.4 C), temperature source Oral, resp. rate 15, height 5\' 6"  (1.676 m), weight 174 lb 4.8 oz (79.1 kg), SpO2 98%.  LABORATORY DATA: Lab Results  Component Value Date   WBC 6.2 02/15/2023   HGB 13.7 02/15/2023   HCT 41.0 02/15/2023   MCV 91.3 02/15/2023   PLT 272 02/15/2023      Chemistry      Component Value Date/Time   NA 141 02/15/2023 0949   NA 147 (H) 07/23/2022 1303   NA 142 01/19/2017 0958   K 4.4 02/15/2023 0949   K 4.3 01/19/2017 0958   CL 109 02/15/2023 0949   CO2 27 02/15/2023 0949   CO2 25  01/19/2017 0958   BUN 18 02/15/2023 0949   BUN 12 07/23/2022 1303   BUN 15.2 01/19/2017 0958   CREATININE 0.87 02/15/2023 0949   CREATININE 0.8 01/19/2017 0958      Component Value Date/Time   CALCIUM 9.4 02/15/2023 0949   CALCIUM 9.5 01/19/2017 0958   ALKPHOS 87 02/15/2023 0949   ALKPHOS 121 01/19/2017 0958   AST 19 02/15/2023 0949   AST 19 01/19/2017 0958   ALT 15 02/15/2023 0949   ALT 20 01/19/2017 0958   BILITOT 0.7 02/15/2023 0949   BILITOT 0.64 01/19/2017 0958       RADIOGRAPHIC STUDIES: CT Chest W Contrast  Result Date: 02/17/2023 CLINICAL DATA:  Non-small cell lung cancer (NSCLC), restaging. Right lower lobectomy. Completed chemotherapy and radiation therapy. * Tracking Code: BO * EXAM: CT CHEST WITH CONTRAST TECHNIQUE: Multidetector CT imaging of the chest was performed during intravenous contrast administration. RADIATION DOSE REDUCTION: This exam was performed according to the departmental dose-optimization program which includes automated exposure control, adjustment of the mA and/or kV according to patient size and/or use of iterative reconstruction technique. CONTRAST:  75mL ISOVUE-300 IOPAMIDOL (ISOVUE-300) INJECTION 61% COMPARISON:  02/15/2022 chest CT. FINDINGS: Cardiovascular: Normal heart size. No significant pericardial effusion/thickening. Left anterior descending and right coronary atherosclerosis. Atherosclerotic nonaneurysmal thoracic aorta. Normal caliber pulmonary arteries. No central pulmonary emboli. Mediastinum/Nodes: No significant thyroid nodules. Unremarkable esophagus. No pathologically enlarged axillary, mediastinal or hilar lymph nodes. Cystic right paratracheal 1.5 x 1.4 cm focus (series 2/image 34) appears contiguous with pericardial space, unchanged on numerous prior chest CT studies compatible with pericardial recess fluid. Lungs/Pleura: No pneumothorax. No pleural effusion. Moderate centrilobular emphysema. Status post right lower lobectomy. No acute  consolidative airspace disease, lung masses or significant pulmonary nodules. Stable left upper lobe parenchymal band. Upper abdomen: Simple 1.8 cm anterior left liver cyst with additional subcentimeter hypodense left liver dome lesion that is too small to characterize and unchanged. Left colonic diverticulosis. Musculoskeletal: No aggressive appearing focal osseous lesions. Moderate thoracic spondylosis. IMPRESSION: 1. No evidence of local tumor recurrence in the right lung status post right lower lobectomy. 2. No evidence of recurrent metastatic disease in the chest. 3. Two-vessel coronary atherosclerosis. 4. Aortic Atherosclerosis (ICD10-I70.0) and Emphysema (ICD10-J43.9). Electronically Signed   By: Delbert Phenix M.D.   On: 02/17/2023 08:57   CARDIAC CATHETERIZATION  Result Date: 01/26/2023   Prox RCA lesion is 75% stenosed.   A drug-eluting stent was successfully placed using a SYNERGY XD 3.0X16, postdilated to greater than 3.25 mm.   Post intervention, there is a 0% residual stenosis.   The left ventricular systolic function is normal.   LV end diastolic pressure is normal.  The left ventricular ejection fraction is 50-55% by visual estimate.   Hemodynamic findings consistent with mild pulmonary hypertension.   There is no aortic valve stenosis.   In the absence of any other complications or medical issues, we expect the patient to be ready for discharge from an interventional cardiology perspective on 01/26/2023.   Recommend uninterrupted dual antiplatelet therapy with Aspirin 81mg  daily and Clopidogrel 75mg  daily for a minimum of 6 months (stable ischemic heart disease-Class I recommendation).   On room air, aortic saturation 91%, PA saturation 74%, mean right atrial pressure 8 mmHg, PA pressure 40/18, mean PA pressure 28 mmHg, mean pulmonary capillary wedge pressure 11 mmHg, cardiac output 8.3 L/min, cardiac index 4.4. Mild pulmonary artery hypertension, likely related to her prior lung disease.  Successful drug-eluting stent placement to the proximal RCA.  Continue clopidogrel for 6 months along with aspirin.  Continue aggressive secondary prevention. Plan for same-day discharge.  Results discussed with the patient's daughter, [name].     ASSESSMENT AND PLAN:  This is a very pleasant 67 years old white female with a stage IIIA non-small cell lung cancer status post neoadjuvant concurrent chemoradiation with weekly carboplatin and paclitaxel followed by right lower lobectomy and lymph node dissection with the final pathology consistent with a pathological stage pT1a, pN0. The patient has been on observation since 2016. The patient is feeling fine today with no concerning symptoms. She had repeat CT scan of the chest performed recently.  I personally and independently reviewed the scan and discussed the result with the patient today. Her scan showed no concerning findings for disease recurrence or metastasis in the chest. I recommended for her to continue on observation with repeat CT scan of the chest in 1 year. She was advised to call immediately if she has any other concerning symptoms in the interval. The patient voices understanding of current disease status and treatment options and is in agreement with the current care plan. All questions were answered. The patient knows to call the clinic with any problems, questions or concerns. We can certainly see the patient much sooner if necessary.  Disclaimer: This note was dictated with voice recognition software. Similar sounding words can inadvertently be transcribed and may not be corrected upon review.

## 2023-02-18 ENCOUNTER — Encounter (HOSPITAL_COMMUNITY)
Admission: RE | Admit: 2023-02-18 | Discharge: 2023-02-18 | Disposition: A | Discharge status: Home / Self Care | Payer: Medicare Other | Source: Ambulatory Visit | Attending: Interventional Cardiology | Admitting: Interventional Cardiology

## 2023-02-18 DIAGNOSIS — Z48812 Encounter for surgical aftercare following surgery on the circulatory system: Secondary | ICD-10-CM | POA: Diagnosis not present

## 2023-02-18 DIAGNOSIS — Z955 Presence of coronary angioplasty implant and graft: Secondary | ICD-10-CM | POA: Diagnosis not present

## 2023-02-21 ENCOUNTER — Other Ambulatory Visit: Payer: Self-pay

## 2023-02-21 ENCOUNTER — Encounter (HOSPITAL_COMMUNITY)
Admission: RE | Admit: 2023-02-21 | Discharge: 2023-02-21 | Disposition: A | Discharge status: Home / Self Care | Payer: Medicare Other | Source: Ambulatory Visit | Attending: Interventional Cardiology | Admitting: Interventional Cardiology

## 2023-02-21 DIAGNOSIS — Z955 Presence of coronary angioplasty implant and graft: Secondary | ICD-10-CM

## 2023-02-21 DIAGNOSIS — Z48812 Encounter for surgical aftercare following surgery on the circulatory system: Secondary | ICD-10-CM | POA: Diagnosis not present

## 2023-02-21 MED ORDER — ROSUVASTATIN CALCIUM 20 MG PO TABS: 20.0000 mg | ORAL_TABLET | Freq: Every day | ORAL | 3 refills | Status: DC

## 2023-02-21 NOTE — Telephone Encounter (Signed)
Pt's medication was sent to pt's pharmacy as requested, confirmation received.

## 2023-02-23 ENCOUNTER — Encounter (HOSPITAL_COMMUNITY)
Admission: RE | Admit: 2023-02-23 | Discharge: 2023-02-23 | Disposition: A | Discharge status: Home / Self Care | Payer: Medicare Other | Source: Ambulatory Visit | Attending: Interventional Cardiology | Admitting: Interventional Cardiology

## 2023-02-23 DIAGNOSIS — Z955 Presence of coronary angioplasty implant and graft: Secondary | ICD-10-CM | POA: Diagnosis not present

## 2023-02-23 DIAGNOSIS — Z48812 Encounter for surgical aftercare following surgery on the circulatory system: Secondary | ICD-10-CM | POA: Diagnosis not present

## 2023-02-23 DIAGNOSIS — J449 Chronic obstructive pulmonary disease, unspecified: Secondary | ICD-10-CM

## 2023-02-25 ENCOUNTER — Encounter (HOSPITAL_COMMUNITY)
Admission: RE | Admit: 2023-02-25 | Discharge: 2023-02-25 | Disposition: A | Discharge status: Home / Self Care | Payer: Medicare Other | Source: Ambulatory Visit | Attending: Interventional Cardiology

## 2023-02-25 DIAGNOSIS — Z955 Presence of coronary angioplasty implant and graft: Secondary | ICD-10-CM | POA: Diagnosis not present

## 2023-02-25 DIAGNOSIS — Z48812 Encounter for surgical aftercare following surgery on the circulatory system: Secondary | ICD-10-CM | POA: Diagnosis not present

## 2023-02-25 DIAGNOSIS — J449 Chronic obstructive pulmonary disease, unspecified: Secondary | ICD-10-CM

## 2023-02-28 ENCOUNTER — Encounter (HOSPITAL_COMMUNITY)
Admission: RE | Admit: 2023-02-28 | Discharge: 2023-02-28 | Disposition: A | Discharge status: Home / Self Care | Payer: Medicare Other | Source: Ambulatory Visit | Attending: Interventional Cardiology

## 2023-02-28 DIAGNOSIS — Z955 Presence of coronary angioplasty implant and graft: Secondary | ICD-10-CM

## 2023-02-28 DIAGNOSIS — Z48812 Encounter for surgical aftercare following surgery on the circulatory system: Secondary | ICD-10-CM | POA: Diagnosis not present

## 2023-03-01 DIAGNOSIS — L812 Freckles: Secondary | ICD-10-CM | POA: Diagnosis not present

## 2023-03-01 DIAGNOSIS — L821 Other seborrheic keratosis: Secondary | ICD-10-CM | POA: Diagnosis not present

## 2023-03-01 DIAGNOSIS — D1801 Hemangioma of skin and subcutaneous tissue: Secondary | ICD-10-CM | POA: Diagnosis not present

## 2023-03-01 NOTE — Progress Notes (Signed)
Cardiac Individual Treatment Plan  Patient Details  Name: [name] [name] MRN: [id number] Date of Birth: [birth year] Referring Provider:   Flowsheet Row INTENSIVE CARDIAC REHAB ORIENT from 02/08/2023 in [facility]  Referring Provider Lance Muss, MD       Initial Encounter Date:  Flowsheet Row INTENSIVE CARDIAC REHAB ORIENT from 02/08/2023 in [facility] for Heart, Vascular, & Lung Health  Date 02/08/23       Visit Diagnosis: 01/26/23 Status post coronary artery stent placement  Patient's Home Medications on Admission:  Current Outpatient Medications:    aspirin EC 81 MG tablet, Take 1 tablet (81 mg total) by mouth daily. Swallow whole., Disp: 30 tablet, Rfl: 12   clopidogrel (PLAVIX) 75 MG tablet, Take 1 tablet (75 mg total) by mouth daily., Disp: 90 tablet, Rfl: 1   loratadine (CLARITIN) 10 MG tablet, Take 10 mg by mouth daily as needed for rhinitis or allergies., Disp: , Rfl:    neomycin-bacitracin-polymyxin (NEOSPORIN) [phone] ointment, Apply 1 Application topically daily as needed (wound care)., Disp: , Rfl:    nitroGLYCERIN (NITROSTAT) 0.4 MG SL tablet, Place 1 tablet (0.4 mg total) under the tongue every 5 (five) minutes as needed for chest pain., Disp: 25 tablet, Rfl: 6   rosuvastatin (CRESTOR) 20 MG tablet, Take 1 tablet (20 mg total) by mouth daily., Disp: 90 tablet, Rfl: 3   sacubitril-valsartan (ENTRESTO) 49-51 MG, Take 1 tablet by mouth 2 (two) times daily., Disp: 180 tablet, Rfl: 3   Suvorexant (BELSOMRA) 10 MG TABS, Take 1 tablet (10 mg total) by mouth at bedtime as needed., Disp: 30 tablet, Rfl: 2   Tiotropium Bromide-Olodaterol (STIOLTO RESPIMAT) 2.5-2.5 MCG/ACT AERS, Inhale 2 puffs into the lungs daily., Disp: , Rfl:    VENTOLIN HFA 108 (90 Base) MCG/ACT inhaler, INHALE 1 PUFF BY MOUTH THREE TIMES DAILY AS NEEDED FOR WHEEZING FOR SHORTNESS OF BREATH, Disp: 18 g, Rfl: 0    Vitamin D, Ergocalciferol, (DRISDOL) 50000 units CAPS capsule, Take 50,000 Units by mouth every 7 (seven) days., Disp: , Rfl:   Past Medical History: Past Medical History:  Diagnosis Date   Arthritis of hand 12/04/2021   CAD (coronary artery disease)    Central scotoma, 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 she was given albuterol inhaler & had steroid with surg.     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, transient, takes Verapimil for migraine & BP control    Mild obstructive sleep apnea 07/28/2021   Pain of right thumb 06/24/2017   Plantar fasciitis of left foot 03/12/2022   PONV (postoperative nausea and vomiting)    Primary cancer of right lower lobe of lung 04/17/2014   Shortness of breath 09/17/2019   Sprain of lateral ligament of ankle joint 09/11/2021   Subjective memory complaints 07/16/2022   Tenosynovitis of both hands 12/04/2021   Thyroid nodule  benign by biopsy   Tightness of left gastrocnemius muscle 03/12/2022   Transient visual loss 01/25/2012   Varicose veins    left leg   Vitamin D deficiency    Wears glasses     Tobacco Use: Social History   Tobacco Use  Smoking Status Former   Current packs/day: 0.00   Average packs/day: 0.5 packs/day for 40.0 years (20.0 ttl pk-yrs)   Types: Cigarettes   Start date: 03/17/1973   Quit date: 03/17/2013   Years since quitting: 9.9  Smokeless Tobacco Never    Labs: Review  Flowsheet  More data exists      Latest Ref Rng & Units 10/11/2017 04/30/2019 04/21/2020 11/14/2020 01/26/2023  Labs for ITP Cardiac and Pulmonary Rehab  Cholestrol 100 - 199 mg/dL 295  284  132  440  -  LDL (calc) 0 - 99 mg/dL 55  53  44  48  -  HDL-C >39 mg/dL 73  82  87  68  -  Trlycerides 0 - 149 mg/dL 54  64  60  46  -  PH, Arterial 7.35 - 7.45 - - - - 7.351   PCO2 arterial 32 - 48 mmHg - - - - 41.0   Bicarbonate 20.0 - 28.0 mmol/L - - - - 23.8  23.5  22.7   TCO2 22 - 32 mmol/L - - - - 25  25  24    Acid-base deficit 0.0 - 2.0 mmol/L - - - - 2.0  2.0  3.0   O2 Saturation % - - - - 75  72  91     Details       Multiple values from one day are sorted in reverse-chronological order         Capillary Blood Glucose: Lab Results  Component Value Date   GLUCAP 119 (H) 04/23/2014     Exercise Target Goals: Exercise Program Goal: Individual exercise prescription set using results from initial 6 min walk test and THRR while considering  patient's activity barriers and safety.   Exercise Prescription Goal: Initial exercise prescription builds to 30-45 minutes a day of aerobic activity, 2-3 days per week.  Home exercise guidelines will be given to patient during program as part of exercise prescription that the participant will acknowledge.  Activity Barriers & Risk Stratification:  Activity Barriers & Cardiac Risk Stratification - 02/08/23 1320       Activity Barriers & Cardiac Risk Stratification   Activity Barriers Balance Concerns;Arthritis;Joint Problems;Back Problems;Deconditioning;Shortness of Breath;Neck/Spine Problems    Cardiac Risk Stratification High             6 Minute Walk:  6 Minute Walk     Row Name 02/08/23 1143         6 Minute Walk   Phase Initial     Distance 1374 feet     Walk Time 6 minutes     # of Rest Breaks 0     MPH 2.6     METS 3.4     RPE 11     Perceived Dyspnea  1     VO2 Peak 11.8     Symptoms Yes (comment)     Comments SOB,  RPD = 1     Resting HR 79 bpm     Resting BP 126/72     Resting Oxygen Saturation  97 %     Exercise Oxygen Saturation  during 6 min walk 96 %     Max Ex. HR 119  bpm     Max Ex. BP 150/80     2 Minute Post BP 132/70       Interval Oxygen   Interval Oxygen? Yes     Baseline Oxygen Saturation % 97 %     1 Minute Oxygen Saturation % 94 %     1 Minute Liters of Oxygen 0 L     2 Minute Oxygen Saturation % 97 %     2 Minute Liters of Oxygen 0 L     3 Minute Oxygen Saturation % 95 %     3 Minute Liters of Oxygen 0 L     4 Minute Oxygen Saturation % 96 %     4 Minute Liters of Oxygen 0 L     5 Minute Oxygen Saturation % 95 %     5 Minute Liters of Oxygen 0 L     6 Minute Oxygen Saturation % 96 %     6 Minute Liters of Oxygen 0 L     2 Minute Post Oxygen Saturation % 99 %              Oxygen Initial Assessment:   Oxygen Re-Evaluation:   Oxygen Discharge (Final Oxygen Re-Evaluation):   Initial Exercise Prescription:  Initial Exercise Prescription - 02/08/23 1300       Date of Initial Exercise RX and Referring Provider   Date 02/08/23    Referring Provider Lance Muss, MD    Expected Discharge Date 04/23/21      NuStep   Level 2    SPM 80    Minutes 15    METs 3.4      Recumbant Elliptical   Level 2    RPM 50    Watts 25    Minutes 15    METs 3.4      Prescription Details   Frequency (times per week) 3    Duration Progress to 30 minutes of continuous aerobic without signs/symptoms of physical distress      Intensity   THRR 40-80% of Max Heartrate 60-121    Ratings of Perceived Exertion 11-13    Perceived Dyspnea 0-4      Progression   Progression Continue progressive overload as per policy without signs/symptoms or physical distress.      Resistance Training   Training Prescription Yes    Weight 3 lbs    Reps 10-15             Perform Capillary Blood Glucose checks as needed.  Exercise Prescription Changes:   Exercise Prescription  Changes     Row Name 02/14/23 1400 02/25/23 1400           Response to Exercise   Blood Pressure (Admit) 130/60 106/66      Blood Pressure (Exercise) 156/84 132/70      Blood Pressure (Exit) 112/62 118/70      Heart Rate (Admit) 97 bpm 92 bpm      Heart Rate (Exercise) 115 bpm 117 bpm      Heart Rate (Exit) 100 bpm 97 bpm      Oxygen Saturation (Admit) 95 % --      Oxygen Saturation (Exercise) 98 % --      Oxygen Saturation (Exit) 96 % --      Rating of Perceived Exertion (Exercise) 12 11.5      Perceived Dyspnea (Exercise) 1 --      Symptoms SOB, RPD = 1 none  Comments Pt's first day in the CRP2 program Reviewed METs      Duration Continue with 30 min of aerobic exercise without signs/symptoms of physical distress. Continue with 30 min of aerobic exercise without signs/symptoms of physical distress.      Intensity THRR unchanged THRR unchanged        Progression   Progression Continue to progress workloads to maintain intensity without signs/symptoms of physical distress. --      Average METs 2.3 --        Resistance Training   Training Prescription Yes Yes      Weight 3 lbs 3      Reps 10-15 10-15      Time 10 Minutes 10 Minutes        Interval Training   Interval Training No --        NuStep   Level 2 2      SPM 90 73      Minutes 15 15      METs 2.5 2.4        Recumbant Elliptical   Level 2 2      RPM 51 58      Watts 64 75      Minutes 15 15      METs 2.1 3.5               Exercise Comments:   Exercise Comments     Row Name 02/14/23 1426 02/25/23 1443         Exercise Comments Pt's first day in the CRP[facility] program. Pt excercised with no complaints other than some mild SOB. Reviewed METs with pt today               Exercise Goals and Review:   Exercise Goals     Row Name 02/08/23 1208             Exercise Goals   Increase Physical Activity Yes       Intervention Provide advice, education, support and counseling about physical  activity/exercise needs.;Develop an individualized exercise prescription for aerobic and resistive training based on initial evaluation findings, risk stratification, comorbidities and participant's personal goals.       Expected Outcomes Short Term: Attend rehab on a regular basis to increase amount of physical activity.;Long Term: Add in home exercise to make exercise part of routine and to increase amount of physical activity.;Long Term: Exercising regularly at least 3-5 days a week.       Increase Strength and Stamina Yes       Intervention Provide advice, education, support and counseling about physical activity/exercise needs.;Develop an individualized exercise prescription for aerobic and resistive training based on initial evaluation findings, risk stratification, comorbidities and participant's personal goals.       Expected Outcomes Short Term: Increase workloads from initial exercise prescription for resistance, speed, and METs.;Short Term: Perform resistance training exercises routinely during rehab and add in resistance training at home;Long Term: Improve cardiorespiratory fitness, muscular endurance and strength as measured by increased METs and functional capacity ( )       Able to understand and use rate of perceived exertion (RPE) scale Yes       Intervention Provide education and explanation on how to use RPE scale       Expected Outcomes Short Term: Able to use RPE daily in rehab to express subjective intensity level;Long Term:  Able to use RPE to guide intensity level when exercising independently  Able to understand and use Dyspnea scale Yes       Intervention Provide education and explanation on how to use Dyspnea scale       Expected Outcomes Short Term: Able to use Dyspnea scale daily in rehab to express subjective sense of shortness of breath during exertion;Long Term: Able to use Dyspnea scale to guide intensity level when exercising independently       Knowledge and  understanding of Target Heart Rate Range (THRR) Yes       Intervention Provide education and explanation of THRR including how the numbers were predicted and where they are located for reference       Expected Outcomes Short Term: Able to state/look up THRR;Short Term: Able to use daily as guideline for intensity in rehab;Long Term: Able to use THRR to govern intensity when exercising independently       Understanding of Exercise Prescription Yes       Intervention Provide education, explanation, and written materials on patient's individual exercise prescription       Expected Outcomes Short Term: Able to explain program exercise prescription;Long Term: Able to explain home exercise prescription to exercise independently                Exercise Goals Re-Evaluation :  Exercise Goals Re-Evaluation     Row Name 02/14/23 1425 02/25/23 1442           Exercise Goal Re-Evaluation   Exercise Goals Review Increase Physical Activity;Understanding of Exercise Prescription;Increase Strength and Stamina;Knowledge and understanding of Target Heart Rate Range (THRR);Able to understand and use Dyspnea scale;Able to understand and use rate of perceived exertion (RPE) scale --      Comments Pt's first day in the CRP2 program, Pt understnads the RPE scale, THRR and exercise Rx Reviewed METs with pt today      Expected Outcomes Will continue to monitor patient and progress exercise workloads as tolerated. Will continue to watch pt closely and progress as tolerated.               Discharge Exercise Prescription (Final Exercise Prescription Changes):  Exercise Prescription Changes - 02/25/23 1400       Response to Exercise   Blood Pressure (Admit) 106/66    Blood Pressure (Exercise) 132/70    Blood Pressure (Exit) 118/70    Heart Rate (Admit) 92 bpm    Heart Rate (Exercise) 117 bpm    Heart Rate (Exit) 97 bpm    Rating of Perceived Exertion (Exercise) 11.5    Symptoms none    Comments  Reviewed METs    Duration Continue with 30 min of aerobic exercise without signs/symptoms of physical distress.    Intensity THRR unchanged      Resistance Training   Training Prescription Yes    Weight 3    Reps 10-15    Time 10 Minutes      NuStep   Level 2    SPM 73    Minutes 15    METs 2.4      Recumbant Elliptical   Level 2    RPM 58    Watts 75    Minutes 15    METs 3.5             Nutrition:  Target Goals: Understanding of nutrition guidelines, daily intake of sodium 1500mg , cholesterol 200mg , calories 30% from fat and 7% or less from saturated fats, daily to have 5 or more servings of fruits and vegetables.  Biometrics:  Pre Biometrics - 02/08/23 1209       Pre Biometrics   % Body Fat 41.9 %             Post Biometrics - 02/08/23 1209        Post  Biometrics   Waist Circumference 41 inches    Hip Circumference 46 inches    Waist to Hip Ratio 0.89 %    Triceps Skinfold 31 mm    Grip Strength 34 kg    Flexibility 13.5 in    Single Leg Stand 6.37 seconds             Nutrition Therapy Plan and Nutrition Goals:  Nutrition Therapy & Goals - 02/14/23 1320       Nutrition Therapy   Diet Heart Healthy Diet    Drug/Food Interactions Statins/Certain Fruits      Personal Nutrition Goals   Nutrition Goal Patient to identify strategies for reducing cardiovascular risk by attending the [employer] education and nutrition series weekly.    Personal Goal #2 Patient to improve diet quality by using the plate method as a guide for meal planning to include lean protein/plant protein, fruits, vegetables, whole grains, nonfat dairy as part of a well-balanced diet.    Personal Goal #3 Patient to reduce sodium to 1500mg  per day.    Comments Bryley reports motvation to improve eating habits. She has medical history of CAD, CHF, HTN, COPD. Patient will benefit from participation in intensive cardiac rehab for nutrition, exercise, and lifestyle modification.       Intervention Plan   Intervention Prescribe, educate and counsel regarding individualized specific dietary modifications aiming towards targeted core components such as weight, hypertension, lipid management, diabetes, heart failure and other comorbidities.;Nutrition handout(s) given to patient.    Expected Outcomes Short Term Goal: Understand basic principles of dietary content, such as calories, fat, sodium, cholesterol and nutrients.;Long Term Goal: Adherence to prescribed nutrition plan.             Nutrition Assessments:  Nutrition Assessments - 02/15/23 1151       Rate Your Plate Scores   Pre Score 45            MEDIFICTS Score Key: >=70 Need to make dietary changes  40-70 Heart Healthy Diet <= 40 Therapeutic Level Cholesterol Diet   Flowsheet Row INTENSIVE CARDIAC REHAB from 02/14/2023 in [facility] for Heart, Vascular, & Lung Health  Picture Your Plate Total Score on Admission 45      Picture Your Plate Scores: <84 Unhealthy dietary pattern with much room for improvement. 41-50 Dietary pattern unlikely to meet recommendations for good health and room for improvement. 51-60 More healthful dietary pattern, with some room for improvement.  >60 Healthy dietary pattern, although there may be some specific behaviors that could be improved.    Nutrition Goals Re-Evaluation:  Nutrition Goals Re-Evaluation     Row Name 02/14/23 1320             Goals   Current Weight 175 lb 14.8 oz (79.8 kg)       Comment HDL 79       Expected Outcome Jaleel reports motvation to improve eating habits. She has medical history of CAD, CHF, HTN, COPD. Patient will benefit from participation in intensive cardiac rehab for nutrition, exercise, and lifestyle modification.                Nutrition Goals Re-Evaluation:  Nutrition Goals Re-Evaluation  Row Name 02/14/23 1320             Goals   Current Weight 175 lb 14.8 oz (79.8 kg)        Comment HDL 79       Expected Outcome Taryah reports motvation to improve eating habits. She has medical history of CAD, CHF, HTN, COPD. Patient will benefit from participation in intensive cardiac rehab for nutrition, exercise, and lifestyle modification.                Nutrition Goals Discharge (Final Nutrition Goals Re-Evaluation):  Nutrition Goals Re-Evaluation - 02/14/23 1320       Goals   Current Weight 175 lb 14.8 oz (79.8 kg)    Comment HDL 79    Expected Outcome Neeya reports motvation to improve eating habits. She has medical history of CAD, CHF, HTN, COPD. Patient will benefit from participation in intensive cardiac rehab for nutrition, exercise, and lifestyle modification.             Psychosocial: Target Goals: Acknowledge presence or absence of significant depression and/or stress, maximize coping skills, provide positive support system. Participant is able to verbalize types and ability to use techniques and skills needed for reducing stress and depression.  Initial Review & Psychosocial Screening:  Initial Psych Review & Screening - 02/08/23 1210       Initial Review   Current issues with Current Sleep Concerns   Pt has mild sleep apena but has not been Rx a CPAP     Family Dynamics   Good Support System? Yes   Has daughter for support, and neice     Barriers   Psychosocial barriers to participate in program There are no identifiable barriers or psychosocial needs.      Screening Interventions   Interventions Encouraged to exercise             Quality of Life Scores:  Quality of Life - 02/08/23 1323       Quality of Life   Select Quality of Life      Quality of Life Scores   Health/Function Pre 28.62 %    Socioeconomic Pre 30 %    Psych/Spiritual Pre 30 %    Family Pre 30 %    GLOBAL Pre 29.38 %            Scores of 19 and below usually indicate a poorer quality of life in these areas.  A difference of  2-3 points is a clinically  meaningful difference.  A difference of 2-3 points in the total score of the Quality of Life Index has been associated with significant improvement in overall quality of life, self-image, physical symptoms, and general health in studies assessing change in quality of life.  PHQ-9: Review Flowsheet  More data exists      02/08/2023 04/23/2021 02/16/2021 07/10/2018 06/05/2014  Depression screen PHQ 2/9  Decreased Interest 0 0 0 0 0  Down, Depressed, Hopeless 0 0 0 0 0  PHQ - 2 Score 0 0 0 0 0  Altered sleeping 1 1 3  0 -  Tired, decreased energy 1 1 0 1 -  Change in appetite 0 0 0 0 -  Feeling bad or failure about yourself  0 0 0 0 -  Trouble concentrating 0 0 0 0 -  Moving slowly or fidgety/restless 0 0 0 0 -  Suicidal thoughts 0 0 0 0 -  PHQ-9 Score 2 2 - 1 -  Difficult doing  work/chores Not difficult at all Not difficult at all Not difficult at all Not difficult at all -    Details           Interpretation of Total Score  Total Score Depression Severity:  1-4 = Minimal depression, 5-9 = Mild depression, 10-14 = Moderate depression, 15-19 = Moderately severe depression, 20-27 = Severe depression   Psychosocial Evaluation and Intervention:   Psychosocial Re-Evaluation:  Psychosocial Re-Evaluation     Row Name 02/14/23 1415 03/01/23 1356           Psychosocial Re-Evaluation   Current issues with None Identified None Identified      Comments Diyala did not voiced any concerns or stressors on her first day of exercise [place] has not voiced any concerns or stressors during exercise at cardiac rehab.      Interventions Stress management education;Encouraged to attend Cardiac Rehabilitation for the exercise;Relaxation education Stress management education;Encouraged to attend Cardiac Rehabilitation for the exercise;Relaxation education      Continue Psychosocial Services  No Follow up required No Follow up required               Psychosocial Discharge (Final Psychosocial  Re-Evaluation):  Psychosocial Re-Evaluation - 03/01/23 1356       Psychosocial Re-Evaluation   Current issues with None Identified    Comments Tenay has not voiced any concerns or stressors during exercise at cardiac rehab.    Interventions Stress management education;Encouraged to attend Cardiac Rehabilitation for the exercise;Relaxation education    Continue Psychosocial Services  No Follow up required             Vocational Rehabilitation: Provide vocational rehab assistance to qualifying candidates.   Vocational Rehab Evaluation & Intervention:  Vocational Rehab - 02/08/23 1224       Initial Vocational Rehab Evaluation & Intervention   Assessment shows need for Vocational Rehabilitation No   Pt is reitred.            Education: Education Goals: Education classes will be provided on a weekly basis, covering required topics. Participant will state understanding/return demonstration of topics presented.    Education     Row Name 02/14/23 1300     Education   Cardiac Education Topics Pritikin   Glass blower/designer Nutrition   Nutrition Workshop Label Reading   Instruction Review Code 1- Verbalizes Understanding   Class Start Time 1145   Class Stop Time 1234   Class Time Calculation (min) 49 min    Row Name 02/16/23 1400     Education   Cardiac Education Topics [employer]ikin   Orthoptist   Educator Dietitian   Weekly Topic [employer] Desserts   Instruction Review Code 1- Verbalizes Understanding   Class Start Time 1145   Class Stop Time 1230   Class Time Calculation (min) 45 min    Row Name 02/18/23 1300     Education   Cardiac Education Topics Pritikin   Nurse, children's   Educator Dietitian   Select Nutrition   Nutrition Other  Label Reading   Instruction Review Code 1- Verbalizes Understanding   Class Start Time 1150   Class Stop Time 1235   Class Time  Calculation (min) 45 min    Row Name 02/21/23 1500     Education   Cardiac Education Topics [employer]   [employer]  Workshops   Biomedical scientist Psychosocial   Psychosocial Workshop Recognizing and Reducing Stress   Instruction Review Code 1- Verbalizes Understanding   Class Start Time 1148   Class Stop Time 1237   Class Time Calculation (min) 49 min    Row Name 02/23/23 1400     Education   Cardiac Education Topics Pritikin   Orthoptist   Educator Dietitian   Weekly Topic Tasty Appetizers and Snacks   Instruction Review Code 1- Verbalizes Understanding   Class Start Time 1145   Class Stop Time 1225   Class Time Calculation (min) 40 min    Row Name 02/25/23 1200     Education   Cardiac Education Topics Pritikin   Nurse, children's Exercise Physiologist   Select Nutrition   Nutrition Calorie Density   Instruction Review Code 1- Verbalizes Understanding   Class Start Time 1155   Class Stop Time 1235   Class Time Calculation (min) 40 min    Row Name 02/28/23 1300     Education   Cardiac Education Topics Pritikin   Select Workshops     Workshops   Educator Exercise Physiologist   Select Exercise   Exercise Workshop Exercise Basics: Building Your Action Plan   Instruction Review Code 1- Verbalizes Understanding   Class Start Time 1155   Class Stop Time 1237   Class Time Calculation (min) 42 min            Core Videos: Exercise    Move It!  Clinical staff conducted group or individual video education with verbal and written material and guidebook.  Patient learns the recommended Pritikin exercise program. Exercise with the goal of living a long, healthy life. Some of the health benefits of exercise include controlled diabetes, healthier blood pressure levels, improved cholesterol levels, improved heart and lung capacity, improved sleep, and better body composition.  Everyone should speak with their doctor before starting or changing an exercise routine.  Biomechanical Limitations Clinical staff conducted group or individual video education with verbal and written material and guidebook.  Patient learns how biomechanical limitations can impact exercise and how we can mitigate and possibly overcome limitations to have an impactful and balanced exercise routine.  Body Composition Clinical staff conducted group or individual video education with verbal and written material and guidebook.  Patient learns that body composition (ratio of muscle mass to fat mass) is a key component to assessing overall fitness, rather than body weight alone. Increased fat mass, especially visceral belly fat, can put [place] at increased risk for metabolic syndrome, type 2 diabetes, heart disease, and even death. It is recommended to combine diet and exercise (cardiovascular and resistance training) to improve your body composition. Seek guidance from your physician and exercise physiologist before implementing an exercise routine.  Exercise Action Plan Clinical staff conducted group or individual video education with verbal and written material and guidebook.  Patient learns the recommended strategies to achieve and enjoy long-term exercise adherence, including variety, self-motivation, self-efficacy, and positive decision making. Benefits of exercise include fitness, good health, weight management, more energy, better sleep, less stress, and overall well-being.  Medical   Heart Disease Risk Reduction Clinical staff conducted group or individual video education with verbal and written material and guidebook.  Patient learns our heart is our most vital organ as it circulates oxygen, nutrients, white blood cells, and hormones throughout the  entire body, and carries waste away. Data supports a plant-based eating plan like the Pritikin Program for its effectiveness in slowing progression of and  reversing heart disease. The video provides a number of recommendations to address heart disease.   Metabolic Syndrome and Belly Fat  Clinical staff conducted group or individual video education with verbal and written material and guidebook.  Patient learns what metabolic syndrome is, how it leads to heart disease, and how one can reverse it and keep it from coming back. You have metabolic syndrome if you have 3 of the following 5 criteria: abdominal obesity, high blood pressure, high triglycerides, low HDL cholesterol, and high blood sugar.  Hypertension and Heart Disease Clinical staff conducted group or individual video education with verbal and written material and guidebook.  Patient learns that high blood pressure, or hypertension, is very common in the [place]. Hypertension is largely due to excessive salt intake, but other important risk factors include being overweight, physical inactivity, drinking too much alcohol, smoking, and not eating enough potassium from fruits and vegetables. High blood pressure is a leading risk factor for heart attack, stroke, congestive heart failure, dementia, kidney failure, and premature death. Long-term effects of excessive salt intake include stiffening of the arteries and thickening of heart muscle and organ damage. Recommendations include ways to reduce hypertension and the risk of heart disease.  Diseases of Our Time - Focusing on Diabetes Clinical staff conducted group or individual video education with verbal and written material and guidebook.  Patient learns why the best way to stop diseases of our time is prevention, through food and other lifestyle changes. Medicine (such as prescription pills and surgeries) is often only a Band-Aid on the problem, not a long-term solution. Most common diseases of our time include obesity, type 2 diabetes, hypertension, heart disease, and cancer. The Pritikin Program is recommended and has been proven to help  reduce, reverse, and/or prevent the damaging effects of metabolic syndrome.  Nutrition   Overview of the Pritikin Eating Plan  Clinical staff conducted group or individual video education with verbal and written material and guidebook.  Patient learns about the Pritikin Eating Plan for disease risk reduction. The Pritikin Eating Plan emphasizes a wide variety of unrefined, minimally-processed carbohydrates, like fruits, vegetables, whole grains, and legumes. Go, Caution, and Stop food choices are explained. Plant-based and lean animal proteins are emphasized. Rationale provided for low sodium intake for blood pressure control, low added sugars for blood sugar stabilization, and low added fats and oils for coronary artery disease risk reduction and weight management.  Calorie Density  Clinical staff conducted group or individual video education with verbal and written material and guidebook.  Patient learns about calorie density and how it impacts the Pritikin Eating Plan. Knowing the characteristics of the food you choose will help you decide whether those foods will lead to weight gain or weight loss, and whether you want to consume more or less of them. Weight loss is usually a side effect of the Pritikin Eating Plan because of its focus on low calorie-dense foods.  Label Reading  Clinical staff conducted group or individual video education with verbal and written material and guidebook.  Patient learns about the Pritikin recommended label reading guidelines and corresponding recommendations regarding calorie density, added sugars, sodium content, and whole grains.  Dining Out - Part 1  Clinical staff conducted group or individual video education with verbal and written material and guidebook.  Patient learns that restaurant meals can be sabotaging  because they can be so high in calories, fat, sodium, and/or sugar. Patient learns recommended strategies on how to positively address this and avoid  unhealthy pitfalls.  Facts on Fats  Clinical staff conducted group or individual video education with verbal and written material and guidebook.  Patient learns that lifestyle modifications can be just as effective, if not more so, as many medications for lowering your risk of heart disease. A Pritikin lifestyle can help to reduce your risk of inflammation and atherosclerosis (cholesterol build-up, or plaque, in the artery walls). Lifestyle interventions such as dietary choices and physical activity address the cause of atherosclerosis. A review of the types of fats and their impact on blood cholesterol levels, along with dietary recommendations to reduce fat intake is also included.  Nutrition Action Plan  Clinical staff conducted group or individual video education with verbal and written material and guidebook.  Patient learns how to incorporate Pritikin recommendations into their lifestyle. Recommendations include planning and keeping personal health goals in mind as an important part of their success.  Healthy Mind-Set    Healthy Minds, Bodies, Hearts  Clinical staff conducted group or individual video education with verbal and written material and guidebook.  Patient learns how to identify when they are stressed. Video will discuss the impact of that stress, as well as the many benefits of stress management. Patient will also be introduced to stress management techniques. The way we think, act, and feel has an impact on our hearts.  How Our Thoughts Can Heal Our Hearts  Clinical staff conducted group or individual video education with verbal and written material and guidebook.  Patient learns that negative thoughts can cause depression and anxiety. This can result in negative lifestyle behavior and serious health problems. Cognitive behavioral therapy is an effective method to help control our thoughts in order to change and improve our emotional outlook.  Additional Videos:  Exercise     Improving Performance  Clinical staff conducted group or individual video education with verbal and written material and guidebook.  Patient learns to use a non-linear approach by alternating intensity levels and lengths of time spent exercising to help burn more calories and lose more body fat. Cardiovascular exercise helps improve heart health, metabolism, hormonal balance, blood sugar control, and recovery from fatigue. Resistance training improves strength, endurance, balance, coordination, reaction time, metabolism, and muscle mass. Flexibility exercise improves circulation, posture, and balance. Seek guidance from your physician and exercise physiologist before implementing an exercise routine and learn your capabilities and proper form for all exercise.  Introduction to Yoga  Clinical staff conducted group or individual video education with verbal and written material and guidebook.  Patient learns about yoga, a discipline of the coming together of mind, breath, and body. The benefits of yoga include improved flexibility, improved range of motion, better posture and core strength, increased lung function, weight loss, and positive self-image. Yoga's heart health benefits include lowered blood pressure, healthier heart rate, decreased cholesterol and triglyceride levels, improved immune function, and reduced stress. Seek guidance from your physician and exercise physiologist before implementing an exercise routine and learn your capabilities and proper form for all exercise.  Medical   Aging: Enhancing Your Quality of Life  Clinical staff conducted group or individual video education with verbal and written material and guidebook.  Patient learns key strategies and recommendations to stay in good physical health and enhance quality of life, such as prevention strategies, having an advocate, securing a Health Care Proxy and Power of  Attorney, and keeping a list of medications and system for  tracking them. It also discusses how to avoid risk for bone loss.  Biology of Weight Control  Clinical staff conducted group or individual video education with verbal and written material and guidebook.  Patient learns that weight gain occurs because we consume more calories than we burn (eating more, moving less). Even if your body weight is normal, you may have higher ratios of fat compared to muscle mass. Too much body fat puts you at increased risk for cardiovascular disease, heart attack, stroke, type 2 diabetes, and obesity-related cancers. In addition to exercise, following the Pritikin Eating Plan can help reduce your risk.  Decoding Lab Results  Clinical staff conducted group or individual video education with verbal and written material and guidebook.  Patient learns that lab test reflects one measurement whose values change over time and are influenced by many factors, including medication, stress, sleep, exercise, food, hydration, pre-existing medical conditions, and more. It is recommended to use the knowledge from this video to become more involved with your lab results and evaluate your numbers to speak with your doctor.   Diseases of Our Time - Overview  Clinical staff conducted group or individual video education with verbal and written material and guidebook.  Patient learns that according to the CDC, 50% to 70% of chronic diseases (such as obesity, type 2 diabetes, elevated lipids, hypertension, and heart disease) are avoidable through lifestyle improvements including healthier food choices, listening to satiety cues, and increased physical activity.  Sleep Disorders Clinical staff conducted group or individual video education with verbal and written material and guidebook.  Patient learns how good quality and duration of sleep are important to overall health and well-being. Patient also learns about sleep disorders and how they impact health along with recommendations to address  them, including discussing with a physician.  Nutrition  Dining Out - Part 2 Clinical staff conducted group or individual video education with verbal and written material and guidebook.  Patient learns how to plan ahead and communicate in order to maximize their dining experience in a healthy and nutritious manner. Included are recommended food choices based on the type of restaurant the patient is visiting.   Fueling a Banker conducted group or individual video education with verbal and written material and guidebook.  There is a strong connection between our food choices and our health. Diseases like obesity and type 2 diabetes are very prevalent and are in large-part due to lifestyle choices. The Pritikin Eating Plan provides plenty of food and hunger-curbing satisfaction. It is easy to follow, affordable, and helps reduce health risks.  Menu Workshop  Clinical staff conducted group or individual video education with verbal and written material and guidebook.  Patient learns that restaurant meals can sabotage health goals because they are often packed with calories, fat, sodium, and sugar. Recommendations include strategies to plan ahead and to communicate with the manager, chef, or server to help order a healthier meal.  Planning Your Eating Strategy  Clinical staff conducted group or individual video education with verbal and written material and guidebook.  Patient learns about the Pritikin Eating Plan and its benefit of reducing the risk of disease. The Pritikin Eating Plan does not focus on calories. Instead, it emphasizes high-quality, nutrient-rich foods. By knowing the characteristics of the foods, we choose, we can determine their calorie density and make informed decisions.  Targeting Your Nutrition Priorities  Clinical staff conducted group or individual  video education with verbal and written material and guidebook.  Patient learns that lifestyle habits have  a tremendous impact on disease risk and progression. This video provides eating and physical activity recommendations based on your personal health goals, such as reducing LDL cholesterol, losing weight, preventing or controlling type 2 diabetes, and reducing high blood pressure.  Vitamins and Minerals  Clinical staff conducted group or individual video education with verbal and written material and guidebook.  Patient learns different ways to obtain key vitamins and minerals, including through a recommended healthy diet. It is important to discuss all supplements you take with your doctor.   Healthy Mind-Set    Smoking Cessation  Clinical staff conducted group or individual video education with verbal and written material and guidebook.  Patient learns that cigarette smoking and tobacco addiction pose a serious health risk which affects millions of people. Stopping smoking will significantly reduce the risk of heart disease, lung disease, and many forms of cancer. Recommended strategies for quitting are covered, including working with your doctor to develop a successful plan.  Culinary   Becoming a Set designer conducted group or individual video education with verbal and written material and guidebook.  Patient learns that cooking at home can be healthy, cost-effective, quick, and puts them in control. Keys to cooking healthy recipes will include looking at your recipe, assessing your equipment needs, planning ahead, making it simple, choosing cost-effective seasonal ingredients, and limiting the use of added fats, salts, and sugars.  Cooking - Breakfast and Snacks  Clinical staff conducted group or individual video education with verbal and written material and guidebook.  Patient learns how important breakfast is to satiety and nutrition through the entire day. Recommendations include key foods to eat during breakfast to help stabilize blood sugar levels and to prevent  overeating at meals later in the day. Planning ahead is also a key component.  Cooking - Educational psychologist conducted group or individual video education with verbal and written material and guidebook.  Patient learns eating strategies to improve overall health, including an approach to cook more at home. Recommendations include thinking of animal protein as a side on your plate rather than center stage and focusing instead on lower calorie dense options like vegetables, fruits, whole grains, and plant-based proteins, such as beans. Making sauces in large quantities to freeze for later and leaving the skin on your vegetables are also recommended to maximize your experience.  Cooking - Healthy Salads and Dressing Clinical staff conducted group or individual video education with verbal and written material and guidebook.  Patient learns that vegetables, fruits, whole grains, and legumes are the foundations of the Pritikin Eating Plan. Recommendations include how to incorporate each of these in flavorful and healthy salads, and how to create homemade salad dressings. Proper handling of ingredients is also covered. Cooking - Soups and [employer] - Soups and Desserts Clinical staff conducted group or individual video education with verbal and written material and guidebook.  Patient learns that Pritikin soups and desserts make for easy, nutritious, and delicious snacks and meal components that are low in sodium, fat, sugar, and calorie density, while high in vitamins, minerals, and filling fiber. Recommendations include simple and healthy ideas for soups and desserts.   Overview     The Pritikin Solution Program Overview Clinical staff conducted group or individual video education with verbal and written material and guidebook.  Patient learns that the results of the Pritikin Program  have been documented in more than 100 articles published in peer-reviewed journals, and the benefits  include reducing risk factors for (and, in some cases, even reversing) high cholesterol, high blood pressure, type 2 diabetes, obesity, and more! An overview of the three key pillars of the Pritikin Program will be covered: eating well, doing regular exercise, and having a healthy mind-set.  WORKSHOPS  Exercise: Exercise Basics: Building Your Action Plan Clinical staff led group instruction and group discussion with PowerPoint presentation and patient guidebook. To enhance the learning environment the use of posters, models and videos may be added. At the conclusion of this workshop, patients will comprehend the difference between physical activity and exercise, as well as the benefits of incorporating both, into their routine. Patients will understand the FITT (Frequency, Intensity, Time, and Type) principle and how to use it to build an exercise action plan. In addition, safety concerns and other considerations for exercise and cardiac rehab will be addressed by the presenter. The purpose of this lesson is to promote a comprehensive and effective weekly exercise routine in order to improve patients' overall level of fitness.   Managing Heart Disease: Your Path to a Healthier Heart Clinical staff led group instruction and group discussion with PowerPoint presentation and patient guidebook. To enhance the learning environment the use of posters, models and videos may be added.At the conclusion of this workshop, patients will understand the anatomy and physiology of the heart. Additionally, they will understand how Pritikin's three pillars impact the risk factors, the progression, and the management of heart disease.  The purpose of this lesson is to provide a high-level overview of the heart, heart disease, and how the Pritikin lifestyle positively impacts risk factors.  Exercise Biomechanics Clinical staff led group instruction and group discussion with PowerPoint presentation and patient  guidebook. To enhance the learning environment the use of posters, models and videos may be added. Patients will learn how the structural parts of their bodies function and how these functions impact their daily activities, movement, and exercise. Patients will learn how to promote a neutral spine, learn how to manage pain, and identify ways to improve their physical movement in order to promote healthy living. The purpose of this lesson is to expose patients to common physical limitations that impact physical activity. Participants will learn practical ways to adapt and manage aches and pains, and to minimize their effect on regular exercise. Patients will learn how to maintain good posture while sitting, walking, and lifting.  Balance Training and Fall Prevention  Clinical staff led group instruction and group discussion with PowerPoint presentation and patient guidebook. To enhance the learning environment the use of posters, models and videos may be added. At the conclusion of this workshop, patients will understand the importance of their sensorimotor skills (vision, proprioception, and the vestibular system) in maintaining their ability to balance as they age. Patients will apply a variety of balancing exercises that are appropriate for their current level of function. Patients will understand the common causes for poor balance, possible solutions to these problems, and ways to modify their physical environment in order to minimize their fall risk. The purpose of this lesson is to teach patients about the importance of maintaining balance as they age and ways to minimize their risk of falling.  WORKSHOPS   Nutrition:  Fueling a Ship broker led group instruction and group discussion with PowerPoint presentation and patient guidebook. To enhance the learning environment the use of posters, models and videos may  be added. Patients will review the foundational principles of the  Pritikin Eating Plan and understand what constitutes a serving size in each of the food groups. Patients will also learn Pritikin-friendly foods that are better choices when away from home and review make-ahead meal and snack options. Calorie density will be reviewed and applied to three nutrition priorities: weight maintenance, weight loss, and weight gain. The purpose of this lesson is to reinforce (in a group setting) the key concepts around what patients are recommended to eat and how to apply these guidelines when away from home by planning and selecting Pritikin-friendly options. Patients will understand how calorie density may be adjusted for different weight management goals.  Mindful Eating  Clinical staff led group instruction and group discussion with PowerPoint presentation and patient guidebook. To enhance the learning environment the use of posters, models and videos may be added. Patients will briefly review the concepts of the Pritikin Eating Plan and the importance of low-calorie dense foods. The concept of mindful eating will be introduced as well as the importance of paying attention to internal hunger signals. Triggers for non-hunger eating and techniques for dealing with triggers will be explored. The purpose of this lesson is to provide patients with the opportunity to review the basic principles of the Pritikin Eating Plan, discuss the value of eating mindfully and how to measure internal cues of hunger and fullness using the Hunger Scale. Patients will also discuss reasons for non-hunger eating and learn strategies to use for controlling emotional eating.  Targeting Your Nutrition Priorities Clinical staff led group instruction and group discussion with PowerPoint presentation and patient guidebook. To enhance the learning environment the use of posters, models and videos may be added. Patients will learn how to determine their genetic susceptibility to disease by reviewing their  family history. Patients will gain insight into the importance of diet as part of an overall healthy lifestyle in mitigating the impact of genetics and other environmental insults. The purpose of this lesson is to provide patients with the opportunity to assess their personal nutrition priorities by looking at their family history, their own health history and current risk factors. Patients will also be able to discuss ways of prioritizing and modifying the Pritikin Eating Plan for their highest risk areas  Menu  Clinical staff led group instruction and group discussion with PowerPoint presentation and patient guidebook. To enhance the learning environment the use of posters, models and videos may be added. Using menus brought in from [employer], or printed from [employer], patients will apply the Pritikin dining out guidelines that were presented in the [employer] video. Patients will also be able to practice these guidelines in a variety of provided scenarios. The purpose of this lesson is to provide patients with the opportunity to practice hands-on learning of the Pritikin Dining Out guidelines with actual menus and practice scenarios.  Label Reading Clinical staff led group instruction and group discussion with PowerPoint presentation and patient guidebook. To enhance the learning environment the use of posters, models and videos may be added. Patients will review and discuss the Pritikin label reading guidelines presented in [employer]'s Label Reading Educational series video. Using fool labels brought in from local grocery stores and markets, patients will apply the label reading guidelines and determine if the packaged food meet the Pritikin guidelines. The purpose of this lesson is to provide patients with the opportunity to review, discuss, and practice hands-on learning of the Pritikin Label Reading guidelines with  actual packaged food labels. Cooking School  Pritikin's  [employer] are designed to teach patients ways to prepare quick, simple, and affordable recipes at home. The importance of nutrition's role in chronic disease risk reduction is reflected in its emphasis in the overall Pritikin program. By learning how to prepare essential core Pritikin Eating Plan recipes, patients will increase control over what they eat; be able to customize the flavor of foods without the use of added salt, sugar, or fat; and improve the quality of the food they consume. By learning a set of core recipes which are easily assembled, quickly prepared, and affordable, patients are more likely to prepare more healthy foods at home. These workshops focus on convenient breakfasts, simple entres, side dishes, and desserts which can be prepared with minimal effort and are consistent with nutrition recommendations for cardiovascular risk reduction. Cooking [employer] are taught by a Armed forces logistics/support/administrative officer (RD) who has been trained by the [employer]. The chef or RD has a clear understanding of the importance of minimizing - if not completely eliminating - added fat, sugar, and sodium in recipes. Throughout the series of Cooking School Workshop sessions, patients will learn about healthy ingredients and efficient methods of cooking to build confidence in their capability to prepare    Cooking School weekly topics:  Adding Flavor- Sodium-Free  Fast and Healthy Breakfasts  Powerhouse Plant-Based Proteins  Satisfying Salads and Dressings  Simple Sides and Sauces  International Cuisine-Spotlight on the [employer] Zones  Delicious Desserts  Savory Soups  [employer] - Meals in a Astronomer Appetizers and Snacks  Comforting Weekend Breakfasts  One-Pot Wonders   Fast Evening Meals  Landscape architect Your Pritikin Plate  WORKSHOPS   Healthy Mindset (Psychosocial):  Focused Goals, Sustainable Changes Clinical staff led group  instruction and group discussion with PowerPoint presentation and patient guidebook. To enhance the learning environment the use of posters, models and videos may be added. Patients will be able to apply effective goal setting strategies to establish at least one personal goal, and then take consistent, meaningful action toward that goal. They will learn to identify common barriers to achieving personal goals and develop strategies to overcome them. Patients will also gain an understanding of how our mind-set can impact our ability to achieve goals and the importance of cultivating a positive and growth-oriented mind-set. The purpose of this lesson is to provide patients with a deeper understanding of how to set and achieve personal goals, as well as the tools and strategies needed to overcome common obstacles which may arise along the way.  From Head to Heart: The Power of a Healthy Outlook  Clinical staff led group instruction and group discussion with PowerPoint presentation and patient guidebook. To enhance the learning environment the use of posters, models and videos may be added. Patients will be able to recognize and describe the impact of emotions and mood on physical health. They will discover the importance of self-care and explore self-care practices which may work for them. Patients will also learn how to utilize the 4 C's to cultivate a healthier outlook and better manage stress and challenges. The purpose of this lesson is to demonstrate to patients how a healthy outlook is an essential part of maintaining good health, especially as they continue their cardiac rehab journey.  Healthy Sleep for a Healthy Heart Clinical staff led group instruction and group discussion with PowerPoint presentation and patient guidebook. To enhance the learning environment  the use of posters, models and videos may be added. At the conclusion of this workshop, patients will be able to demonstrate knowledge of the  importance of sleep to overall health, well-being, and quality of life. They will understand the symptoms of, and treatments for, common sleep disorders. Patients will also be able to identify daytime and nighttime behaviors which impact sleep, and they will be able to apply these tools to help manage sleep-related challenges. The purpose of this lesson is to provide patients with a general overview of sleep and outline the importance of quality sleep. Patients will learn about a few of the most common sleep disorders. Patients will also be introduced to the concept of "sleep hygiene," and discover ways to self-manage certain sleeping problems through simple daily behavior changes. Finally, the workshop will motivate patients by clarifying the links between quality sleep and their goals of heart-healthy living.   Recognizing and Reducing Stress Clinical staff led group instruction and group discussion with PowerPoint presentation and patient guidebook. To enhance the learning environment the use of posters, models and videos may be added. At the conclusion of this workshop, patients will be able to understand the types of stress reactions, differentiate between acute and chronic stress, and recognize the impact that chronic stress has on their health. They will also be able to apply different coping mechanisms, such as reframing negative self-talk. Patients will have the opportunity to practice a variety of stress management techniques, such as deep abdominal breathing, progressive muscle relaxation, and/or guided imagery.  The purpose of this lesson is to educate patients on the role of stress in their lives and to provide healthy techniques for coping with it.  Learning Barriers/Preferences:  Learning Barriers/Preferences - 02/08/23 1324       Learning Barriers/Preferences   Learning Barriers Sight   wears glasses   Learning Preferences Audio;Computer/Internet;Group Instruction;Individual  Instruction;Pictoral;Skilled Demonstration;Verbal Instruction;Video             Education Topics:  Knowledge Questionnaire Score:  Knowledge Questionnaire Score - 02/08/23 1325       Knowledge Questionnaire Score   Pre Score 22/24             Core Components/Risk Factors/Patient Goals at Admission:  Personal Goals and Risk Factors at Admission - 02/08/23 1224       Core Components/Risk Factors/Patient Goals on Admission    Weight Management Yes;Weight Loss    Intervention Weight Management: Develop a combined nutrition and exercise program designed to reach desired caloric intake, while maintaining appropriate intake of nutrient and fiber, sodium and fats, and appropriate energy expenditure required for the weight goal.;Weight Management: Provide education and appropriate resources to help participant work on and attain dietary goals.;Weight Management/Obesity: Establish reasonable short term and long term weight goals.    Admit Weight 175 lb 14.8 oz (79.8 kg)    Expected Outcomes Weight Loss: Understanding of general recommendations for a balanced deficit meal plan, which promotes 1-2 lb weight loss per week and includes a negative energy balance of [phone] kcal/d;Short Term: Continue to assess and modify interventions until short term weight is achieved;Long Term: Adherence to nutrition and physical activity/exercise program aimed toward attainment of established weight goal;Understanding of distribution of calorie intake throughout the day with the consumption of 4-5 meals/snacks;Understanding recommendations for meals to include 15-35% energy as protein, 25-35% energy from fat, 35-60% energy from carbohydrates, less than 200mg  of dietary cholesterol, 20-35 gm of total fiber daily    Hypertension Yes  Intervention Provide education on lifestyle modifcations including regular physical activity/exercise, weight management, moderate sodium restriction and increased consumption of  fresh fruit, vegetables, and low fat dairy, alcohol moderation, and smoking cessation.;Monitor prescription use compliance.    Expected Outcomes Short Term: Continued assessment and intervention until BP is < 140/96mm HG in hypertensive participants. < 130/84mm HG in hypertensive participants with diabetes, heart failure or chronic kidney disease.;Long Term: Maintenance of blood pressure at goal levels.    Lipids Yes    Intervention Provide education and support for participant on nutrition & aerobic/resistive exercise along with prescribed medications to achieve LDL 70mg , HDL >40mg .    Expected Outcomes Short Term: Participant states understanding of desired cholesterol values and is compliant with medications prescribed. Participant is following exercise prescription and nutrition guidelines.;Long Term: Cholesterol controlled with medications as prescribed, with individualized exercise RX and with personalized nutrition plan. Value goals: LDL < 70mg , HDL > 40 mg.             Core Components/Risk Factors/Patient Goals Review:   Goals and Risk Factor Review     Row Name 02/14/23 1416 03/01/23 1359           Core Components/Risk Factors/Patient Goals Review   Personal Goals Review Weight Management/Obesity;Hypertension;Lipids Weight Management/Obesity;Hypertension;Lipids      Review Neosha started cardiac rehab on 02/14/23 and did well with exercise. Vital signs and CBg's were stable Jemika started cardiac rehab on 02/14/23 and doing  well with exercise. Vital signs have been stable      Expected Outcomes Aaira will continue to participate in cardiac rehab for exercise, nutrition and lifestyle modifications Nalee will continue to participate in cardiac rehab for exercise, nutrition and lifestyle modifications               Core Components/Risk Factors/Patient Goals at Discharge (Final Review):   Goals and Risk Factor Review - 03/01/23 1359       Core Components/Risk  Factors/Patient Goals Review   Personal Goals Review Weight Management/Obesity;Hypertension;Lipids    Review Lyn started cardiac rehab on 02/14/23 and doing  well with exercise. Vital signs have been stable    Expected Outcomes Daliza will continue to participate in cardiac rehab for exercise, nutrition and lifestyle modifications             ITP Comments:  ITP Comments     Row Name 02/08/23 1204 02/14/23 1413 03/01/23 1356       ITP Comments Armanda Magic, MD: Medical Director.  Intorduction to the [employer] / Intensive Cardiac Rehab.  Initial orientation packet reviewed with the patient. 30 Day ITP Review. Jesselyn started cardiac rehab on 02/14/23 and did well with exercise 30 Day ITP Review. Lemia has good attendance and participation with exercise at cardiac rehab.              Comments: See ITP comments.Thayer Headings RN BSN

## 2023-03-02 ENCOUNTER — Ambulatory Visit: Payer: Medicare Other | Admitting: Nurse Practitioner

## 2023-03-02 ENCOUNTER — Encounter (HOSPITAL_COMMUNITY)
Admission: RE | Admit: 2023-03-02 | Discharge: 2023-03-02 | Disposition: A | Discharge status: Home / Self Care | Payer: Medicare Other | Source: Ambulatory Visit | Attending: Interventional Cardiology | Admitting: Interventional Cardiology

## 2023-03-02 DIAGNOSIS — Z48812 Encounter for surgical aftercare following surgery on the circulatory system: Secondary | ICD-10-CM | POA: Diagnosis not present

## 2023-03-02 DIAGNOSIS — Z955 Presence of coronary angioplasty implant and graft: Secondary | ICD-10-CM | POA: Diagnosis not present

## 2023-03-02 DIAGNOSIS — J449 Chronic obstructive pulmonary disease, unspecified: Secondary | ICD-10-CM

## 2023-03-04 ENCOUNTER — Encounter (HOSPITAL_COMMUNITY): Payer: Medicare Other

## 2023-03-07 ENCOUNTER — Encounter (HOSPITAL_COMMUNITY)
Admission: RE | Admit: 2023-03-07 | Discharge: 2023-03-07 | Disposition: A | Discharge status: Home / Self Care | Payer: Medicare Other | Source: Ambulatory Visit | Attending: Interventional Cardiology

## 2023-03-07 DIAGNOSIS — Z955 Presence of coronary angioplasty implant and graft: Secondary | ICD-10-CM | POA: Diagnosis not present

## 2023-03-07 DIAGNOSIS — J449 Chronic obstructive pulmonary disease, unspecified: Secondary | ICD-10-CM

## 2023-03-07 DIAGNOSIS — Z48812 Encounter for surgical aftercare following surgery on the circulatory system: Secondary | ICD-10-CM | POA: Diagnosis not present

## 2023-03-08 ENCOUNTER — Ambulatory Visit (HOSPITAL_COMMUNITY): Payer: Medicare Other | Attending: Nurse Practitioner

## 2023-03-08 DIAGNOSIS — I10 Essential (primary) hypertension: Secondary | ICD-10-CM | POA: Diagnosis not present

## 2023-03-08 DIAGNOSIS — I5042 Chronic combined systolic (congestive) and diastolic (congestive) heart failure: Secondary | ICD-10-CM | POA: Insufficient documentation

## 2023-03-08 DIAGNOSIS — J449 Chronic obstructive pulmonary disease, unspecified: Secondary | ICD-10-CM | POA: Diagnosis not present

## 2023-03-08 DIAGNOSIS — I2510 Atherosclerotic heart disease of native coronary artery without angina pectoris: Secondary | ICD-10-CM | POA: Insufficient documentation

## 2023-03-08 LAB — ECHOCARDIOGRAM COMPLETE
Area-P 1/2: 5.13 cm2
MV M vel: 5.14 m/s
MV Peak grad: 105.7 mm[Hg]
S' Lateral: 4.1 cm

## 2023-03-09 ENCOUNTER — Encounter (HOSPITAL_COMMUNITY)
Admission: RE | Admit: 2023-03-09 | Discharge: 2023-03-09 | Disposition: A | Discharge status: Home / Self Care | Payer: Medicare Other | Source: Ambulatory Visit | Attending: Interventional Cardiology | Admitting: Interventional Cardiology

## 2023-03-09 DIAGNOSIS — Z955 Presence of coronary angioplasty implant and graft: Secondary | ICD-10-CM

## 2023-03-09 DIAGNOSIS — Z48812 Encounter for surgical aftercare following surgery on the circulatory system: Secondary | ICD-10-CM | POA: Diagnosis not present

## 2023-03-09 DIAGNOSIS — I10 Essential (primary) hypertension: Secondary | ICD-10-CM | POA: Insufficient documentation

## 2023-03-09 DIAGNOSIS — J449 Chronic obstructive pulmonary disease, unspecified: Secondary | ICD-10-CM

## 2023-03-11 ENCOUNTER — Encounter (HOSPITAL_COMMUNITY)
Admission: RE | Admit: 2023-03-11 | Discharge: 2023-03-11 | Disposition: A | Discharge status: Home / Self Care | Payer: Medicare Other | Source: Ambulatory Visit | Attending: Interventional Cardiology

## 2023-03-11 DIAGNOSIS — I10 Essential (primary) hypertension: Secondary | ICD-10-CM | POA: Diagnosis not present

## 2023-03-11 DIAGNOSIS — Z48812 Encounter for surgical aftercare following surgery on the circulatory system: Secondary | ICD-10-CM | POA: Diagnosis not present

## 2023-03-11 DIAGNOSIS — Z955 Presence of coronary angioplasty implant and graft: Secondary | ICD-10-CM

## 2023-03-11 DIAGNOSIS — J449 Chronic obstructive pulmonary disease, unspecified: Secondary | ICD-10-CM | POA: Diagnosis not present

## 2023-03-14 ENCOUNTER — Encounter (HOSPITAL_COMMUNITY)
Admission: RE | Admit: 2023-03-14 | Discharge: 2023-03-14 | Disposition: A | Discharge status: Home / Self Care | Payer: Medicare Other | Source: Ambulatory Visit | Attending: Interventional Cardiology | Admitting: Interventional Cardiology

## 2023-03-14 DIAGNOSIS — Z955 Presence of coronary angioplasty implant and graft: Secondary | ICD-10-CM

## 2023-03-14 DIAGNOSIS — J449 Chronic obstructive pulmonary disease, unspecified: Secondary | ICD-10-CM | POA: Diagnosis not present

## 2023-03-14 DIAGNOSIS — Z48812 Encounter for surgical aftercare following surgery on the circulatory system: Secondary | ICD-10-CM | POA: Diagnosis not present

## 2023-03-14 DIAGNOSIS — I10 Essential (primary) hypertension: Secondary | ICD-10-CM | POA: Diagnosis not present

## 2023-03-14 NOTE — Progress Notes (Unsigned)
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 she was given albuterol inhaler & had steroid with surg.     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, transient, takes Verapimil for migraine & BP control    Mild obstructive sleep apnea 07/28/2021   Pain of right thumb 06/24/2017   Plantar fasciitis of left foot 03/12/2022   PONV (postoperative nausea and vomiting)    Primary cancer of right lower lobe of lung 04/17/2014   Shortness of breath 09/17/2019   Sprain of lateral ligament of ankle joint 09/11/2021   Subjective memory complaints 07/16/2022   Tenosynovitis of both hands 12/04/2021   Thyroid nodule    benign by biopsy   Tightness of left gastrocnemius muscle 03/12/2022   Transient visual loss 01/25/2012   Varicose veins    left leg   Vitamin D deficiency    Wears glasses     History of Present Illness  [name] [name]  is a 67 year old female with a PMH of CAD s/p R/LHC with 75% proximal RCA treated with DES x 1, mild pulmonary HTN, HLD, PVCs, non-small cell lung cancer stage IIIa  s/p partial pneumonectomy and radiation, COPD, GERD who presents today for follow up.   [name] [name] was last seen on 02/03/23 after undergoing PCI of the RCA.  During her visit she reported doing well with improvement to SOB and no CP. She was granted clearance to cardiac rehab and has participated over the past two months. She did have a significant hematoma that was healing nicely with no evidence of inflammation.  She was found to have mildly reduced EF of 40-45% and GDMT was increased to Entresto 97/103 mg with plan to have addition of SGLT2 or MRA.  During today's visit the patient reports that she has not experienced any chest pain and is tolerating cardiac rehab without any difficulties.  Her blood pressure today was controlled at 116/70 and heart rate was 91 bpm.  She has been compliant with her current medications and denies any adverse reactions.  During today's visit we reviewed the results of her most recent echo and discussed next approach is for medical treatment.  Through a shared decision patient was in favor of increasing Entresto with plan to add spironolactone 12.5 mg if blood pressure allows.  She was euvolemic today on examination and had no complaints of shortness of breath or fatigue with exertion.  Patient denies chest pain, palpitations, dyspnea, PND, orthopnea, nausea, vomiting, dizziness, syncope, edema, weight gain, or early satiety.  Review of Systems  Please see  the history of present illness.    All other systems reviewed and are otherwise negative except as noted above.  Physical Exam    Wt Readings from Last 3 Encounters:  02/17/23 174 lb 4.8 oz (79.1 kg)  02/08/23 175 lb 14.8 oz (79.8 kg)  02/03/23 174 lb (78.9 kg)   YN:WGNFA were no vitals filed for this visit.,There is no height or weight on file to calculate BMI. GEN: Well nourished, well developed in no acute distress Neck: No JVD; No carotid bruits Pulmonary: Clear to auscultation without rales, wheezing or  rhonchi  Cardiovascular: Normal rate. Regular rhythm. Normal S1. Normal S2.   Murmurs: There is no murmur.  ABDOMEN: Soft, non-tender, non-distended EXTREMITIES:  No edema; No deformity   EKG/LABS/ Recent Cardiac Studies   ECG personally reviewed by me today -none completed today  Risk Assessment/Calculations:          Lab Results  Component Value Date   WBC 6.2 02/15/2023   HGB 13.7 02/15/2023   HCT 41.0 02/15/2023   MCV 91.3 02/15/2023   PLT 272 02/15/2023   Lab Results  Component Value Date   CREATININE 0.87 02/15/2023   BUN 18 02/15/2023   NA 141 02/15/2023   K 4.4 02/15/2023   CL 109 02/15/2023   CO2 27 02/15/2023   Lab Results  Component Value Date   CHOL 127 11/14/2020   HDL 68 11/14/2020   LDLCALC 48 11/14/2020   TRIG 46 11/14/2020   CHOLHDL 1.9 11/14/2020    No results found for: "HGBA1C" Assessment & Plan    1.Coronary artery disease: -Patient underwent right/LHC due to progressive shortness of breath on 01/26/2023 showing 75% stenosis and proximal RCA treated with DES x 1 -Today patient reports no chest pain or anginal equivalent. -She is tolerating cardiac rehab with no difficulties. -Continue current GDMT with ASA 81 mg, Plavix 75 mg, Crestor 20 mg, as needed Nitrostat 0.4 mg  2.Chronic combined CHF: -2D echo was completed 06/2022 showing EF of 40-45% with mildly decreased LV function and global hypokinesis with repeat Echo on 03/08/2023 completed with no change in EF. -Today patient reports no shortness of breath or fatigue with activity. -She is currently euvolemic on examination. -We will increase Entresto to 97/103 mg twice daily -BMET in 2 weeks -If patient's blood pressure allows we will further optimize GDMT with spironolactone 12.5 mg daily  3.Essential hypertension: -Patient's blood pressure today was controlled at 116/70 -Continue Entresto 97/103 mg twice daily  4.COPD Gold 2B: -Patient currently followed by pulmonology  Disposition:  Follow-up with Lance Muss, MD or APP in 4 months    Signed, Napoleon Form, Leodis Rains, NP 03/14/2023, 7:32 AM [facility]

## 2023-03-15 ENCOUNTER — Ambulatory Visit: Payer: Medicare Other | Attending: Nurse Practitioner | Admitting: Nurse Practitioner

## 2023-03-15 ENCOUNTER — Encounter: Payer: Self-pay | Admitting: Nurse Practitioner

## 2023-03-15 VITALS — BP 116/70 | HR 91 | Ht 65.5 in | Wt 174.2 lb

## 2023-03-15 DIAGNOSIS — I10 Essential (primary) hypertension: Secondary | ICD-10-CM | POA: Diagnosis not present

## 2023-03-15 DIAGNOSIS — I2510 Atherosclerotic heart disease of native coronary artery without angina pectoris: Secondary | ICD-10-CM | POA: Diagnosis not present

## 2023-03-15 DIAGNOSIS — I5042 Chronic combined systolic (congestive) and diastolic (congestive) heart failure: Secondary | ICD-10-CM | POA: Insufficient documentation

## 2023-03-15 DIAGNOSIS — J449 Chronic obstructive pulmonary disease, unspecified: Secondary | ICD-10-CM | POA: Diagnosis not present

## 2023-03-15 MED ORDER — SACUBITRIL-VALSARTAN 97-103 MG PO TABS: 1.0000 | ORAL_TABLET | Freq: Two times a day (BID) | ORAL | 5 refills | Status: DC

## 2023-03-15 NOTE — Patient Instructions (Addendum)
Medication Instructions:  INCREASE Entresto to 97/103mg  Take 1 tablet twice a day  *If you need a refill on your cardiac medications before your next appointment, please call your pharmacy*   Lab Work: COMPLETE LABS IN 3 WEEKS-BMET If you have labs (blood work) drawn today and your tests are completely normal, you will receive your results only by: MyChart Message (if you have MyChart) OR A paper copy in the mail If you have any lab test that is abnormal or we need to change your treatment, we will call you to review the results.   Testing/Procedures: Your physician has requested that you have an echocardiogram. Echocardiography is a painless test that uses sound waves to create images of your heart. It provides your doctor with information about the size and shape of your heart and how well your heart's chambers and valves are working. This procedure takes approximately one hour. There are no restrictions for this procedure. Please do NOT wear cologne, perfume, aftershave, or lotions (deodorant is allowed). Please arrive 15 minutes prior to your appointment time. SCHEDULE IN 3 MONTHS   Follow-Up: At [facility], you and your health needs are our priority.  As part of our continuing mission to provide you with exceptional heart care, we have created designated Provider Care Teams.  These Care Teams include your primary Cardiologist (physician) and Advanced Practice Providers (APPs -  Physician Assistants and Nurse Practitioners) who all work together to provide you with the care you need, when you need it.  We recommend signing up for the patient portal called "MyChart".  Sign up information is provided on this After Visit Summary.  MyChart is used to connect with patients for Virtual Visits (Telemedicine).  Patients are able to view lab/test results, encounter notes, upcoming appointments, etc.  Non-urgent messages can be sent to your provider as well.   To learn more about what  you can do with MyChart, go to [email or web address].    Your next appointment:   4 month(s)  Provider:   Donato Schultz, MD Other Instructions PREP Program handout given

## 2023-03-16 ENCOUNTER — Encounter (HOSPITAL_COMMUNITY): Payer: Medicare Other

## 2023-03-16 ENCOUNTER — Telehealth (HOSPITAL_COMMUNITY): Payer: Self-pay

## 2023-03-16 NOTE — Progress Notes (Signed)
Received referral to PREP with request to start in New Year.  We will contact pt.

## 2023-03-16 NOTE — Telephone Encounter (Signed)
Pt called and her HVAC system is broke and tech will be out 12-2 so she will not be able to make it today. She also canceled Friday oct 11 and Monday oct 14 she is going out of town.

## 2023-03-18 ENCOUNTER — Encounter (HOSPITAL_COMMUNITY): Payer: Medicare Other

## 2023-03-21 ENCOUNTER — Encounter (HOSPITAL_COMMUNITY): Payer: Medicare Other

## 2023-03-23 ENCOUNTER — Encounter (HOSPITAL_COMMUNITY)
Admission: RE | Admit: 2023-03-23 | Discharge: 2023-03-23 | Disposition: A | Discharge status: Home / Self Care | Payer: Medicare Other | Source: Ambulatory Visit | Attending: Interventional Cardiology | Admitting: Interventional Cardiology

## 2023-03-23 DIAGNOSIS — I10 Essential (primary) hypertension: Secondary | ICD-10-CM | POA: Diagnosis not present

## 2023-03-23 DIAGNOSIS — Z955 Presence of coronary angioplasty implant and graft: Secondary | ICD-10-CM

## 2023-03-23 DIAGNOSIS — Z48812 Encounter for surgical aftercare following surgery on the circulatory system: Secondary | ICD-10-CM | POA: Diagnosis not present

## 2023-03-23 DIAGNOSIS — J449 Chronic obstructive pulmonary disease, unspecified: Secondary | ICD-10-CM | POA: Diagnosis not present

## 2023-03-25 ENCOUNTER — Encounter (HOSPITAL_COMMUNITY)
Admission: RE | Admit: 2023-03-25 | Discharge: 2023-03-25 | Disposition: A | Discharge status: Home / Self Care | Payer: Medicare Other | Source: Ambulatory Visit | Attending: Interventional Cardiology | Admitting: Interventional Cardiology

## 2023-03-25 DIAGNOSIS — I10 Essential (primary) hypertension: Secondary | ICD-10-CM | POA: Diagnosis not present

## 2023-03-25 DIAGNOSIS — J449 Chronic obstructive pulmonary disease, unspecified: Secondary | ICD-10-CM | POA: Diagnosis not present

## 2023-03-25 DIAGNOSIS — Z48812 Encounter for surgical aftercare following surgery on the circulatory system: Secondary | ICD-10-CM | POA: Diagnosis not present

## 2023-03-25 DIAGNOSIS — Z955 Presence of coronary angioplasty implant and graft: Secondary | ICD-10-CM

## 2023-03-28 ENCOUNTER — Encounter (HOSPITAL_COMMUNITY)
Admission: RE | Admit: 2023-03-28 | Discharge: 2023-03-28 | Disposition: A | Discharge status: Home / Self Care | Payer: Medicare Other | Source: Ambulatory Visit | Attending: Interventional Cardiology | Admitting: Interventional Cardiology

## 2023-03-28 DIAGNOSIS — J449 Chronic obstructive pulmonary disease, unspecified: Secondary | ICD-10-CM | POA: Diagnosis not present

## 2023-03-28 DIAGNOSIS — I10 Essential (primary) hypertension: Secondary | ICD-10-CM | POA: Diagnosis not present

## 2023-03-28 DIAGNOSIS — Z955 Presence of coronary angioplasty implant and graft: Secondary | ICD-10-CM

## 2023-03-28 DIAGNOSIS — Z48812 Encounter for surgical aftercare following surgery on the circulatory system: Secondary | ICD-10-CM | POA: Diagnosis not present

## 2023-03-29 NOTE — Progress Notes (Signed)
Cardiac Individual Treatment Plan  Patient Details  Name: [name] [name] MRN: [id number] Date of Birth: 1955/11/18 Referring Provider:   Flowsheet Row INTENSIVE CARDIAC REHAB ORIENT from 02/08/2023 in [facility]  Referring Provider Lance Muss, MD       Initial Encounter Date:  Flowsheet Row INTENSIVE CARDIAC REHAB ORIENT from 02/08/2023 in [facility]  Date 02/08/23       Visit Diagnosis: 01/26/23 Status post coronary artery stent placement  Stage 2 moderate COPD by GOLD classification (HCC)  Patient's Home Medications on Admission:  Current Outpatient Medications:    aspirin EC 81 MG tablet, Take 1 tablet (81 mg total) by mouth daily. Swallow whole., Disp: 30 tablet, Rfl: 12   clopidogrel (PLAVIX) 75 MG tablet, Take 1 tablet (75 mg total) by mouth daily., Disp: 90 tablet, Rfl: 1   loratadine (CLARITIN) 10 MG tablet, Take 10 mg by mouth daily as needed for rhinitis or allergies., Disp: , Rfl:    nitroGLYCERIN (NITROSTAT) 0.4 MG SL tablet, Place 1 tablet (0.4 mg total) under the tongue every 5 (five) minutes as needed for chest pain., Disp: 25 tablet, Rfl: 6   rosuvastatin (CRESTOR) 20 MG tablet, Take 1 tablet (20 mg total) by mouth daily., Disp: 90 tablet, Rfl: 3   sacubitril-valsartan (ENTRESTO) 97-103 MG, Take 1 tablet by mouth 2 (two) times daily., Disp: 60 tablet, Rfl: 5   Suvorexant (BELSOMRA) 10 MG TABS, Take 1 tablet (10 mg total) by mouth at bedtime as needed., Disp: 30 tablet, Rfl: 2   Tiotropium Bromide-Olodaterol (STIOLTO RESPIMAT) 2.5-2.5 MCG/ACT AERS, Inhale 2 puffs into the lungs daily., Disp: , Rfl:    VENTOLIN HFA 108 (90 Base) MCG/ACT inhaler, INHALE 1 PUFF BY MOUTH THREE TIMES DAILY AS NEEDED FOR WHEEZING FOR SHORTNESS OF BREATH, Disp: 18 g, Rfl: 0   Vitamin D, Ergocalciferol, (DRISDOL) 50000 units CAPS capsule, Take 50,000 Units by mouth  every 7 (seven) days., Disp: , Rfl:   Past Medical History: Past Medical History:  Diagnosis Date   Arthritis of hand 12/04/2021   CAD (coronary artery disease)    Central scotoma, 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 she was given albuterol inhaler & had steroid with surg.     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, transient, takes Verapimil for migraine & BP control    Mild obstructive sleep apnea 07/28/2021   Pain of right thumb 06/24/2017   Plantar fasciitis of left foot 03/12/2022   PONV (postoperative nausea and vomiting)    Primary cancer of right lower lobe of lung 04/17/2014   Shortness of breath 09/17/2019   Sprain of lateral ligament of ankle joint 09/11/2021   Subjective memory complaints 07/16/2022   Tenosynovitis of both hands 12/04/2021   Thyroid nodule    benign by biopsy   Tightness of left  gastrocnemius muscle 03/12/2022   Transient visual loss 01/25/2012   Varicose veins    left leg   Vitamin D deficiency    Wears glasses     Tobacco Use: Social History   Tobacco Use  Smoking Status Former   Current packs/day: 0.00   Average packs/day: 0.5 packs/day for 40.0 years (20.0 ttl pk-yrs)   Types: Cigarettes   Start date: 03/17/1973   Quit date: 03/17/2013   Years since quitting: 10.0  Smokeless Tobacco Never    Labs: Review Flowsheet  More data exists      Latest Ref Rng & Units 10/11/2017 04/30/2019 04/21/2020  11/14/2020 01/26/2023  Labs for ITP Cardiac and Pulmonary Rehab  Cholestrol 100 - 199 mg/dL 630  160  109  323  -  LDL (calc) 0 - 99 mg/dL 55  53  44  48  -  HDL-C >39 mg/dL 73  82  87  68  -  Trlycerides 0 - 149 mg/dL 54  64  60  46  -  PH, Arterial 7.35 - 7.45 - - - - 7.351   PCO2 arterial 32 - 48 mmHg - - - - 41.0   Bicarbonate 20.0 - 28.0 mmol/L - - - - 23.8  23.5  22.7   TCO2 22 - 32 mmol/L - - - - 25  25  24    Acid-base deficit 0.0 - 2.0 mmol/L - - - - 2.0  2.0  3.0   O2 Saturation % - - - - 75  72  91     Details       Multiple values from one day are sorted in reverse-chronological order         Capillary Blood Glucose: Lab Results  Component Value Date   GLUCAP 119 (H) 04/23/2014     Exercise Target Goals: Exercise Program Goal: Individual exercise prescription set using results from initial 6 min walk test and THRR while considering  patient's activity barriers and safety.   Exercise Prescription Goal: Initial exercise prescription builds to 30-45 minutes a day of aerobic activity, 2-3 days per week.  Home exercise guidelines will be given to patient during program as part of exercise prescription that the participant will acknowledge.  Activity Barriers & Risk Stratification:  Activity Barriers & Cardiac Risk Stratification - 02/08/23 1320       Activity Barriers & Cardiac Risk Stratification   Activity Barriers Balance Concerns;Arthritis;Joint Problems;Back Problems;Deconditioning;Shortness of Breath;Neck/Spine Problems    Cardiac Risk Stratification High             6 Minute Walk:  6 Minute Walk     Row Name 02/08/23 1143         6 Minute Walk   Phase Initial     Distance 1374 feet     Walk Time 6 minutes     # of Rest Breaks 0     MPH 2.6     METS 3.4     RPE 11     Perceived Dyspnea  1     VO2 Peak 11.8     Symptoms Yes (comment)     Comments SOB, RPD = 1     Resting HR 79 bpm     Resting BP 126/72     Resting Oxygen Saturation  97 %      Exercise Oxygen Saturation  during 6 min walk 96 %     Max Ex. HR 119 bpm     Max Ex. BP  150/80     2 Minute Post BP 132/70       Interval Oxygen   Interval Oxygen? Yes     Baseline Oxygen Saturation % 97 %     1 Minute Oxygen Saturation % 94 %     1 Minute Liters of Oxygen 0 L     2 Minute Oxygen Saturation % 97 %     2 Minute Liters of Oxygen 0 L     3 Minute Oxygen Saturation % 95 %     3 Minute Liters of Oxygen 0 L     4 Minute Oxygen Saturation % 96 %     4 Minute Liters of Oxygen 0 L     5 Minute Oxygen Saturation % 95 %     5 Minute Liters of Oxygen 0 L     6 Minute Oxygen Saturation % 96 %     6 Minute Liters of Oxygen 0 L     2 Minute Post Oxygen Saturation % 99 %              Oxygen Initial Assessment:   Oxygen Re-Evaluation:   Oxygen Discharge (Final Oxygen Re-Evaluation):   Initial Exercise Prescription:  Initial Exercise Prescription - 02/08/23 1300       Date of Initial Exercise RX and Referring Provider   Date 02/08/23    Referring Provider Lance Muss, MD    Expected Discharge Date 04/23/21      NuStep   Level 2    SPM 80    Minutes 15    METs 3.4      Recumbant Elliptical   Level 2    RPM 50    Watts 25    Minutes 15    METs 3.4      Prescription Details   Frequency (times per week) 3    Duration Progress to 30 minutes of continuous aerobic without signs/symptoms of physical distress      Intensity   THRR 40-80% of Max Heartrate 60-121    Ratings of Perceived Exertion 11-13    Perceived Dyspnea 0-4      Progression   Progression Continue progressive overload as per policy without signs/symptoms or physical distress.      Resistance Training   Training Prescription Yes    Weight 3 lbs    Reps 10-15             Perform Capillary Blood Glucose checks as needed.  Exercise Prescription Changes:   Exercise Prescription Changes     Row Name 02/14/23 1400 02/25/23 1400 03/23/23 1500         Response to  Exercise   Blood Pressure (Admit) 130/60 106/66 112/62     Blood Pressure (Exercise) 156/84 132/70 --     Blood Pressure (Exit) 112/62 118/70 118/64     Heart Rate (Admit) 97 bpm 92 bpm 100 bpm     Heart Rate (Exercise) 115 bpm 117 bpm 124 bpm     Heart Rate (Exit) 100 bpm 97 bpm 99 bpm     Oxygen Saturation (Admit) 95 % -- --     Oxygen Saturation (Exercise) 98 % -- --     Oxygen Saturation (Exit) 96 % -- --     Rating of Perceived Exertion (Exercise) 12 11.5 11.5     Perceived Dyspnea (Exercise) 1 -- --     Symptoms SOB, RPD = 1 none None     Comments Pt's first day in  the [facility] program Reviewed METs Reviewed METs and goals     Duration Continue with 30 min of aerobic exercise without signs/symptoms of physical distress. Continue with 30 min of aerobic exercise without signs/symptoms of physical distress. Continue with 30 min of aerobic exercise without signs/symptoms of physical distress.     Intensity THRR unchanged THRR unchanged THRR unchanged       Progression   Progression Continue to progress workloads to maintain intensity without signs/symptoms of physical distress. -- Continue to progress workloads to maintain intensity without signs/symptoms of physical distress.     Average METs 2.3 -- 3.4       Resistance Training   Training Prescription Yes Yes No     Weight 3 lbs 3 No weights on Wednesdays     Reps 10-15 10-15 --     Time 10 Minutes 10 Minutes --       Interval Training   Interval Training No -- No       NuStep   Level 2 2 3      SPM 90 73 90     Minutes 15 15 15      METs 2.5 2.4 2.4       Recumbant Elliptical   Level 2 2 3      RPM 51 58 59     Watts 64 75 93     Minutes 15 15 15      METs 2.1 3.5 4.4              Exercise Comments:   Exercise Comments     Row Name 02/14/23 1426 02/25/23 1443 03/23/23 1500       Exercise Comments Pt's first day in the [facility] program. Pt excercised with no complaints other than some mild SOB. Reviewed METs with pt  today Reviewed METs and goals with [name] [name] today. No changes in modalites or workloads today.              Exercise Goals and Review:   Exercise Goals     Row Name 02/08/23 1208             Exercise Goals   Increase Physical Activity Yes       Intervention Provide advice, education, support and counseling about physical activity/exercise needs.;Develop an individualized exercise prescription for aerobic and resistive training based on initial evaluation findings, risk stratification, comorbidities and participant's personal goals.       Expected Outcomes Short Term: Attend rehab on a regular basis to increase amount of physical activity.;Long Term: Add in home exercise to make exercise part of routine and to increase amount of physical activity.;Long Term: Exercising regularly at least 3-5 days a week.       Increase Strength and Stamina Yes       Intervention Provide advice, education, support and counseling about physical activity/exercise needs.;Develop an individualized exercise prescription for aerobic and resistive training based on initial evaluation findings, risk stratification, comorbidities and participant's personal goals.       Expected Outcomes Short Term: Increase workloads from initial exercise prescription for resistance, speed, and METs.;Short Term: Perform resistance training exercises routinely during rehab and add in resistance training at home;Long Term: Improve cardiorespiratory fitness, muscular endurance and strength as measured by increased METs and functional capacity ( )       Able to understand and use rate of perceived exertion (RPE) scale Yes       Intervention Provide education and explanation on how to use RPE scale  Expected Outcomes Short Term: Able to use RPE daily in rehab to express subjective intensity level;Long Term:  Able to use RPE to guide intensity level when exercising independently       Able to understand and use Dyspnea scale Yes        Intervention Provide education and explanation on how to use Dyspnea scale       Expected Outcomes Short Term: Able to use Dyspnea scale daily in rehab to express subjective sense of shortness of breath during exertion;Long Term: Able to use Dyspnea scale to guide intensity level when exercising independently       Knowledge and understanding of Target Heart Rate Range (THRR) Yes       Intervention Provide education and explanation of THRR including how the numbers were predicted and where they are located for reference       Expected Outcomes Short Term: Able to state/look up THRR;Short Term: Able to use daily as guideline for intensity in rehab;Long Term: Able to use THRR to govern intensity when exercising independently       Understanding of Exercise Prescription Yes       Intervention Provide education, explanation, and written materials on patient's individual exercise prescription       Expected Outcomes Short Term: Able to explain program exercise prescription;Long Term: Able to explain home exercise prescription to exercise independently                Exercise Goals Re-Evaluation :  Exercise Goals Re-Evaluation     Row Name 02/14/23 1425 02/25/23 1442 03/23/23 1500         Exercise Goal Re-Evaluation   Exercise Goals Review Increase Physical Activity;Understanding of Exercise Prescription;Increase Strength and Stamina;Knowledge and understanding of Target Heart Rate Range (THRR);Able to understand and use Dyspnea scale;Able to understand and use rate of perceived exertion (RPE) scale -- Increase Physical Activity;Understanding of Exercise Prescription;Increase Strength and Stamina;Knowledge and understanding of Target Heart Rate Range (THRR);Able to understand and use Dyspnea scale;Able to understand and use rate of perceived exertion (RPE) scale     Comments Pt's first day in the [facility]2 program, Pt understnads the RPE scale, THRR and exercise Rx Reviewed METs with pt today  Reviewed METs and goals with patient today. Peak METs = 4.4. Pt is making good progress. Pt is making progress on her goals of weight loss and eating healthier. Pt would like to do more exerise and we will dicusee home exercise Rx soon.     Expected Outcomes Will continue to monitor patient and progress exercise workloads as tolerated. Will continue to watch pt closely and progress as tolerated. Will continue to monitor patient and progress exercise workloads as tolerated.              Discharge Exercise Prescription (Final Exercise Prescription Changes):  Exercise Prescription Changes - 03/23/23 1500       Response to Exercise   Blood Pressure (Admit) 112/62    Blood Pressure (Exit) 118/64    Heart Rate (Admit) 100 bpm    Heart Rate (Exercise) 124 bpm    Heart Rate (Exit) 99 bpm    Rating of Perceived Exertion (Exercise) 11.5    Symptoms None    Comments Reviewed METs and goals    Duration Continue with 30 min of aerobic exercise without signs/symptoms of physical distress.    Intensity THRR unchanged      Progression   Progression Continue to progress workloads to maintain intensity without signs/symptoms of physical  distress.    Average METs 3.4      Resistance Training   Training Prescription No    Weight No weights on Wednesdays      Interval Training   Interval Training No      NuStep   Level 3    SPM 90    Minutes 15    METs 2.4      Recumbant Elliptical   Level 3    RPM 59    Watts 93    Minutes 15    METs 4.4             Nutrition:  Target Goals: Understanding of nutrition guidelines, daily intake of sodium 1500mg , cholesterol 200mg , calories 30% from fat and 7% or less from saturated fats, daily to have 5 or more servings of fruits and vegetables.  Biometrics:  Pre Biometrics - 02/08/23 1209       Pre Biometrics   % Body Fat 41.9 %             Post Biometrics - 02/08/23 1209        Post  Biometrics   Waist Circumference 41 inches     Hip Circumference 46 inches    Waist to Hip Ratio 0.89 %    Triceps Skinfold 31 mm    Grip Strength 34 kg    Flexibility 13.5 in    Single Leg Stand 6.37 seconds             Nutrition Therapy Plan and Nutrition Goals:  Nutrition Therapy & Goals - 03/17/23 0906       Nutrition Therapy   Diet Heart Healthy Diet    Drug/Food Interactions Statins/Certain Fruits      Personal Nutrition Goals   Nutrition Goal Patient to identify strategies for reducing cardiovascular risk by attending the [facility] education and nutrition series weekly.   goal in action.   Personal Goal #2 Patient to improve diet quality by using the plate method as a guide for meal planning to include lean protein/plant protein, fruits, vegetables, whole grains, nonfat dairy as part of a well-balanced diet.   goal in action.   Personal Goal #3 Patient to reduce sodium to 1500mg  per day.   goal in progress.   Comments Goals in progress. [name] [name] [name] motvation to improve eating habits. She continues to attend to the [facility] education and nutrition series regularly. She has medical history of CAD, CHF, HTN, COPD. She is down 3.3# since starting with our program. She continues follow-up with cardiology for CAD, CHF. Patient will benefit from participation in intensive cardiac rehab for nutrition, exercise, and lifestyle modification.      Intervention Plan   Intervention Prescribe, educate and counsel regarding individualized specific dietary modifications aiming towards targeted core components such as weight, hypertension, lipid management, diabetes, heart failure and other comorbidities.;Nutrition handout(s) given to patient.    Expected Outcomes Short Term Goal: Understand basic principles of dietary content, such as calories, fat, sodium, cholesterol and nutrients.;Long Term Goal: Adherence to prescribed nutrition plan.             Nutrition Assessments:  Nutrition Assessments - 02/15/23 1151       Rate  Your Plate Scores   Pre Score 45            MEDIFICTS Score Key: >=70 Need to make dietary changes  40-70 Heart Healthy Diet <= 40 Therapeutic Level Cholesterol Diet   Flowsheet Row INTENSIVE CARDIAC REHAB from 02/14/2023 in [place]  [facility]  Picture Your Plate Total Score on Admission 45      Picture Your Plate Scores: <60 Unhealthy dietary pattern with much room for improvement. 41-50 Dietary pattern unlikely to meet recommendations for good health and room for improvement. 51-60 More healthful dietary pattern, with some room for improvement.  >60 Healthy dietary pattern, although there may be some specific behaviors that could be improved.    Nutrition Goals Re-Evaluation:  Nutrition Goals Re-Evaluation     Row Name 02/14/23 1320 03/17/23 0906           Goals   Current Weight 175 lb 14.8 oz (79.8 kg) 172 lb 9.9 oz (78.3 kg)      Comment HDL 79 no new labs; most recent labs HDL 79      Expected Outcome [name] [name] [name] motvation to improve eating habits. She has medical history of CAD, CHF, HTN, COPD. Patient will benefit from participation in intensive cardiac rehab for nutrition, exercise, and lifestyle modification. Goals in progress. [name] [name] [name] motvation to improve eating habits. She continues to attend to the [facility] education and nutrition series regularly. She has medical history of CAD, CHF, HTN, COPD. She is down 3.3# since starting with our program. She continues follow-up with cardiology for CAD, CHF. Patient will benefit from participation in intensive cardiac rehab for nutrition, exercise, and lifestyle modification.               Nutrition Goals Re-Evaluation:  Nutrition Goals Re-Evaluation     Row Name 02/14/23 1320 03/17/23 0906           Goals   Current Weight 175 lb 14.8 oz (79.8 kg) 172 lb 9.9 oz (78.3 kg)      Comment HDL 79 no new labs; most recent labs HDL 79      Expected Outcome  [name] [name] [name] motvation to improve eating habits. She has medical history of CAD, CHF, HTN, COPD. Patient will benefit from participation in intensive cardiac rehab for nutrition, exercise, and lifestyle modification. Goals in progress. [name] [name] [name] motvation to improve eating habits. She continues to attend to the [facility] education and nutrition series regularly. She has medical history of CAD, CHF, HTN, COPD. She is down 3.3# since starting with our program. She continues follow-up with cardiology for CAD, CHF. Patient will benefit from participation in intensive cardiac rehab for nutrition, exercise, and lifestyle modification.               Nutrition Goals Discharge (Final Nutrition Goals Re-Evaluation):  Nutrition Goals Re-Evaluation - 03/17/23 0906       Goals   Current Weight 172 lb 9.9 oz (78.3 kg)    Comment no new labs; most recent labs HDL 79    Expected Outcome Goals in progress. [name] [name] motvation to improve eating habits. She continues to attend to the [facility] education and nutrition series regularly. She has medical history of CAD, CHF, HTN, COPD. She is down 3.3# since starting with our program. She continues follow-up with cardiology for CAD, CHF. Patient will benefit from participation in intensive cardiac rehab for nutrition, exercise, and lifestyle modification.             Psychosocial: Target Goals: Acknowledge presence or absence of significant depression and/or stress, maximize coping skills, provide positive support system. Participant is able to verbalize types and ability to use techniques and skills needed for reducing stress and depression.  Initial Review & Psychosocial Screening:  Initial  Psych Review & Screening - 02/08/23 1210       Initial Review   Current issues with Current Sleep Concerns   Pt has mild sleep apena but has not been Rx a CPAP     Family Dynamics   Good Support System? Yes   Has daughter for support, and neice      Barriers   Psychosocial barriers to participate in program There are no identifiable barriers or psychosocial needs.      Screening Interventions   Interventions Encouraged to exercise             Quality of Life Scores:  Quality of Life - 02/08/23 1323       Quality of Life   Select Quality of Life      Quality of Life Scores   Health/Function Pre 28.62 %    Socioeconomic Pre 30 %    Psych/Spiritual Pre 30 %    Family Pre 30 %    GLOBAL Pre 29.38 %            Scores of 19 and below usually indicate a poorer quality of life in these areas.  A difference of  2-3 points is a clinically meaningful difference.  A difference of 2-3 points in the total score of the Quality of Life Index has been associated with significant improvement in overall quality of life, self-image, physical symptoms, and general health in studies assessing change in quality of life.  PHQ-9: Review Flowsheet  More data exists      02/08/2023 04/23/2021 02/16/2021 07/10/2018 06/05/2014  Depression screen PHQ 2/9  Decreased Interest 0 0 0 0 0  Down, Depressed, Hopeless 0 0 0 0 0  PHQ - 2 Score 0 0 0 0 0  Altered sleeping 1 1 3  0 -  Tired, decreased energy 1 1 0 1 -  Change in appetite 0 0 0 0 -  Feeling bad or failure about yourself  0 0 0 0 -  Trouble concentrating 0 0 0 0 -  Moving slowly or fidgety/restless 0 0 0 0 -  Suicidal thoughts 0 0 0 0 -  PHQ-9 Score 2 2 - 1 -  Difficult doing work/chores Not difficult at all Not difficult at all Not difficult at all Not difficult at all -    Details           Interpretation of Total Score  Total Score Depression Severity:  1-4 = Minimal depression, 5-9 = Mild depression, 10-14 = Moderate depression, 15-19 = Moderately severe depression, 20-27 = Severe depression   Psychosocial Evaluation and Intervention:   Psychosocial Re-Evaluation:  Psychosocial Re-Evaluation     Row Name 02/14/23 1415 03/01/23 1356 03/29/23 1316         Psychosocial  Re-Evaluation   Current issues with None Identified None Identified None Identified     Comments [name] [name] did not voiced any concerns or stressors on her first day of exercise [name] [name] has not voiced any concerns or stressors during exercise at cardiac rehab. [name] [name] has not voiced any concerns or stressors during exercise at cardiac rehab.     Interventions Stress management education;Encouraged to attend Cardiac Rehabilitation for the exercise;Relaxation education Stress management education;Encouraged to attend Cardiac Rehabilitation for the exercise;Relaxation education Stress management education;Encouraged to attend Cardiac Rehabilitation for the exercise;Relaxation education     Continue Psychosocial Services  No Follow up required No Follow up required No Follow up required  Psychosocial Discharge (Final Psychosocial Re-Evaluation):  Psychosocial Re-Evaluation - 03/29/23 1316       Psychosocial Re-Evaluation   Current issues with None Identified    Comments [name] [name] has not voiced any concerns or stressors during exercise at cardiac rehab.    Interventions Stress management education;Encouraged to attend Cardiac Rehabilitation for the exercise;Relaxation education    Continue Psychosocial Services  No Follow up required             Vocational Rehabilitation: Provide vocational rehab assistance to qualifying candidates.   Vocational Rehab Evaluation & Intervention:  Vocational Rehab - 02/08/23 1224       Initial Vocational Rehab Evaluation & Intervention   Assessment shows need for Vocational Rehabilitation No   Pt is reitred.            Education: Education Goals: Education classes will be provided on a weekly basis, covering required topics. Participant will state understanding/return demonstration of topics presented.    Education     Row Name 02/14/23 1300     Education   Cardiac Education Topics Pritikin   Firefighter Nutrition   Nutrition Workshop Label Reading   Instruction Review Code 1- Verbalizes Understanding   Class Start Time 1145   Class Stop Time 1234   Class Time Calculation (min) 49 min    Row Name 02/16/23 1400     Education   Cardiac Education Topics Pritikin   Orthoptist   Educator Dietitian   Weekly Topic [employer] Desserts   Instruction Review Code 1- Verbalizes Understanding   Class Start Time 1145   Class Stop Time 1230   Class Time Calculation (min) 45 min    Row Name 02/18/23 1300     Education   Cardiac Education Topics Pritikin   Licensed conveyancer Nutrition   Nutrition Other  Label Reading   Instruction Review Code 1- Verbalizes Understanding   Class Start Time 1150   Class Stop Time 1235   Class Time Calculation (min) 45 min    Row Name 02/21/23 1500     Education   Cardiac Education Topics Pritikin   [employer] Workshops     Workshops   Educator Exercise Physiologist   Select Psychosocial   Psychosocial Workshop Recognizing and Reducing Stress   Instruction Review Code 1- Verbalizes Understanding   Class Start Time 1148   Class Stop Time 1237   Class Time Calculation (min) 49 min    Row Name 02/23/23 1400     Education   Cardiac Education Topics Pritikin   Orthoptist   Educator Dietitian   Weekly Topic Tasty Appetizers and Snacks   Instruction Review Code 1- Verbalizes Understanding   Class Start Time 1145   Class Stop Time 1225   Class Time Calculation (min) 40 min    Row Name 02/25/23 1200     Education   Cardiac Education Topics Pritikin   Nurse, children's Exercise Physiologist   Select Nutrition   Nutrition Calorie Density   Instruction Review Code 1- Verbalizes Understanding   Class Start Time 1155   Class Stop Time 1235   Class Time Calculation (min) 40 min     Row Name 02/28/23 1300  Education   Cardiac Education Topics Pritikin   Geographical information systems officer Exercise   Exercise Workshop Exercise Basics: Diplomatic Services operational officer   Instruction Review Code 1- Verbalizes Understanding   Class Start Time 1155   Class Stop Time 1237   Class Time Calculation (min) 42 min    Row Name 03/02/23 1500     Education   Cardiac Education Topics Pritikin   Customer service manager   Weekly Topic Efficiency Cooking - Meals in a Snap   Instruction Review Code 1- Verbalizes Understanding   Class Start Time 1145   Class Stop Time 1224   Class Time Calculation (min) 39 min    Row Name 03/07/23 1300     Education   Cardiac Education Topics Pritikin   Glass blower/designer Nutrition   Nutrition Workshop Targeting Your Nutrition Priorities   Instruction Review Code 1- Verbalizes Understanding   Class Start Time 1145   Class Stop Time 1225   Class Time Calculation (min) 40 min    Row Name 03/09/23 1200     Education   Cardiac Education Topics Pritikin   Customer service manager   Weekly Topic One-Pot Wonders   Instruction Review Code 1- Verbalizes Understanding   Class Start Time 1145   Class Stop Time 1225   Class Time Calculation (min) 40 min    Row Name 03/11/23 1200     Education   Cardiac Education Topics Pritikin   Hospital doctor Education   General Education Hypertension and Heart Disease   Instruction Review Code 1- Verbalizes Understanding   Class Start Time 1157   Class Stop Time 1240   Class Time Calculation (min) 43 min    Row Name 03/14/23 1300     Education   Cardiac Education Topics Pritikin   Licensed conveyancer Nutrition    Nutrition Dining Out - Part 1   Instruction Review Code 1- Verbalizes Understanding   Class Start Time 1145   Class Stop Time 1230   Class Time Calculation (min) 45 min    Row Name 03/23/23 1500     Education   Cardiac Education Topics Pritikin   Customer service manager   Weekly Topic Fast Evening Meals   Instruction Review Code 1- Verbalizes Understanding   Class Start Time 1145   Class Stop Time 1230   Class Time Calculation (min) 45 min    Row Name 03/25/23 1400     Education   Cardiac Education Topics Pritikin   Select Core Videos     Core Videos   Educator Dietitian   Select Nutrition   Nutrition Vitamins and Minerals   Instruction Review Code 1- Verbalizes Understanding   Class Start Time 1145   Class Stop Time 1226   Class Time Calculation (min) 41 min    Row Name 03/28/23 1300     Education   Cardiac Education Topics Pritikin   Glass blower/designer Nutrition   Nutrition Workshop  Fueling a Forensic psychologist   Instruction Review Code 1- Verbalizes Understanding   Class Start Time 1145   Class Stop Time 1230   Class Time Calculation (min) 45 min            Core Videos: Exercise    Move It!  Clinical staff conducted group or individual video education with verbal and written material and guidebook.  Patient learns the recommended Pritikin exercise program. Exercise with the goal of living a long, healthy life. Some of the health benefits of exercise include controlled diabetes, healthier blood pressure levels, improved cholesterol levels, improved heart and lung capacity, improved sleep, and better body composition. Everyone should speak with their doctor before starting or changing an exercise routine.  Biomechanical Limitations Clinical staff conducted group or individual video education with verbal and written material and guidebook.  Patient learns how biomechanical  limitations can impact exercise and how we can mitigate and possibly overcome limitations to have an impactful and balanced exercise routine.  Body Composition Clinical staff conducted group or individual video education with verbal and written material and guidebook.  Patient learns that body composition (ratio of muscle mass to fat mass) is a key component to assessing overall fitness, rather than body weight alone. Increased fat mass, especially visceral belly fat, can put [place] at increased risk for metabolic syndrome, type 2 diabetes, heart disease, and even death. It is recommended to combine diet and exercise (cardiovascular and resistance training) to improve your body composition. Seek guidance from your physician and exercise physiologist before implementing an exercise routine.  Exercise Action Plan Clinical staff conducted group or individual video education with verbal and written material and guidebook.  Patient learns the recommended strategies to achieve and enjoy long-term exercise adherence, including variety, self-motivation, self-efficacy, and positive decision making. Benefits of exercise include fitness, good health, weight management, more energy, better sleep, less stress, and overall well-being.  Medical   Heart Disease Risk Reduction Clinical staff conducted group or individual video education with verbal and written material and guidebook.  Patient learns our heart is our most vital organ as it circulates oxygen, nutrients, white blood cells, and hormones throughout the entire body, and carries waste away. Data supports a plant-based eating plan like the Pritikin Program for its effectiveness in slowing progression of and reversing heart disease. The video provides a number of recommendations to address heart disease.   Metabolic Syndrome and Belly Fat  Clinical staff conducted group or individual video education with verbal and written material and guidebook.  Patient learns  what metabolic syndrome is, how it leads to heart disease, and how one can reverse it and keep it from coming back. You have metabolic syndrome if you have 3 of the following 5 criteria: abdominal obesity, high blood pressure, high triglycerides, low HDL cholesterol, and high blood sugar.  Hypertension and Heart Disease Clinical staff conducted group or individual video education with verbal and written material and guidebook.  Patient learns that high blood pressure, or hypertension, is very common in the [place]. Hypertension is largely due to excessive salt intake, but other important risk factors include being overweight, physical inactivity, drinking too much alcohol, smoking, and not eating enough potassium from fruits and vegetables. High blood pressure is a leading risk factor for heart attack, stroke, congestive heart failure, dementia, kidney failure, and premature death. Long-term effects of excessive salt intake include stiffening of the arteries and thickening of heart muscle and organ damage. Recommendations include ways to reduce hypertension  and the risk of heart disease.  Diseases of Our Time - Focusing on Diabetes Clinical staff conducted group or individual video education with verbal and written material and guidebook.  Patient learns why the best way to stop diseases of our time is prevention, through food and other lifestyle changes. Medicine (such as prescription pills and surgeries) is often only a Band-Aid on the problem, not a long-term solution. Most common diseases of our time include obesity, type 2 diabetes, hypertension, heart disease, and cancer. The Pritikin Program is recommended and has been proven to help reduce, reverse, and/or prevent the damaging effects of metabolic syndrome.  Nutrition   Overview of the Pritikin Eating Plan  Clinical staff conducted group or individual video education with verbal and written material and guidebook.  Patient learns about the  Pritikin Eating Plan for disease risk reduction. The Pritikin Eating Plan emphasizes a wide variety of unrefined, minimally-processed carbohydrates, like fruits, vegetables, whole grains, and legumes. Go, Caution, and Stop food choices are explained. Plant-based and lean animal proteins are emphasized. Rationale provided for low sodium intake for blood pressure control, low added sugars for blood sugar stabilization, and low added fats and oils for coronary artery disease risk reduction and weight management.  Calorie Density  Clinical staff conducted group or individual video education with verbal and written material and guidebook.  Patient learns about calorie density and how it impacts the Pritikin Eating Plan. Knowing the characteristics of the food you choose will help you decide whether those foods will lead to weight gain or weight loss, and whether you want to consume more or less of them. Weight loss is usually a side effect of the Pritikin Eating Plan because of its focus on low calorie-dense foods.  Label Reading  Clinical staff conducted group or individual video education with verbal and written material and guidebook.  Patient learns about the Pritikin recommended label reading guidelines and corresponding recommendations regarding calorie density, added sugars, sodium content, and whole grains.  Dining Out - Part 1  Clinical staff conducted group or individual video education with verbal and written material and guidebook.  Patient learns that restaurant meals can be sabotaging because they can be so high in calories, fat, sodium, and/or sugar. Patient learns recommended strategies on how to positively address this and avoid unhealthy pitfalls.  Facts on Fats  Clinical staff conducted group or individual video education with verbal and written material and guidebook.  Patient learns that lifestyle modifications can be just as effective, if not more so, as many medications for lowering  your risk of heart disease. A Pritikin lifestyle can help to reduce your risk of inflammation and atherosclerosis (cholesterol build-up, or plaque, in the artery walls). Lifestyle interventions such as dietary choices and physical activity address the cause of atherosclerosis. A review of the types of fats and their impact on blood cholesterol levels, along with dietary recommendations to reduce fat intake is also included.  Nutrition Action Plan  Clinical staff conducted group or individual video education with verbal and written material and guidebook.  Patient learns how to incorporate Pritikin recommendations into their lifestyle. Recommendations include planning and keeping personal health goals in mind as an important part of their success.  Healthy Mind-Set    Healthy Minds, Bodies, Hearts  Clinical staff conducted group or individual video education with verbal and written material and guidebook.  Patient learns how to identify when they are stressed. Video will discuss the impact of that stress, as well as the  many benefits of stress management. Patient will also be introduced to stress management techniques. The way we think, act, and feel has an impact on our hearts.  How Our Thoughts Can Heal Our Hearts  Clinical staff conducted group or individual video education with verbal and written material and guidebook.  Patient learns that negative thoughts can cause depression and anxiety. This can result in negative lifestyle behavior and serious health problems. Cognitive behavioral therapy is an effective method to help control our thoughts in order to change and improve our emotional outlook.  Additional Videos:  Exercise    Improving Performance  Clinical staff conducted group or individual video education with verbal and written material and guidebook.  Patient learns to use a non-linear approach by alternating intensity levels and lengths of time spent exercising to help burn more  calories and lose more body fat. Cardiovascular exercise helps improve heart health, metabolism, hormonal balance, blood sugar control, and recovery from fatigue. Resistance training improves strength, endurance, balance, coordination, reaction time, metabolism, and muscle mass. Flexibility exercise improves circulation, posture, and balance. Seek guidance from your physician and exercise physiologist before implementing an exercise routine and learn your capabilities and proper form for all exercise.  Introduction to Yoga  Clinical staff conducted group or individual video education with verbal and written material and guidebook.  Patient learns about yoga, a discipline of the coming together of mind, breath, and body. The benefits of yoga include improved flexibility, improved range of motion, better posture and core strength, increased lung function, weight loss, and positive self-image. Yoga's heart health benefits include lowered blood pressure, healthier heart rate, decreased cholesterol and triglyceride levels, improved immune function, and reduced stress. Seek guidance from your physician and exercise physiologist before implementing an exercise routine and learn your capabilities and proper form for all exercise.  Medical   Aging: Enhancing Your Quality of Life  Clinical staff conducted group or individual video education with verbal and written material and guidebook.  Patient learns key strategies and recommendations to stay in good physical health and enhance quality of life, such as prevention strategies, having an advocate, securing a Health Care Proxy and Power of Attorney, and keeping a list of medications and system for tracking them. It also discusses how to avoid risk for bone loss.  Biology of Weight Control  Clinical staff conducted group or individual video education with verbal and written material and guidebook.  Patient learns that weight gain occurs because we consume more  calories than we burn (eating more, moving less). Even if your body weight is normal, you may have higher ratios of fat compared to muscle mass. Too much body fat puts you at increased risk for cardiovascular disease, heart attack, stroke, type 2 diabetes, and obesity-related cancers. In addition to exercise, following the Pritikin Eating Plan can help reduce your risk.  Decoding Lab Results  Clinical staff conducted group or individual video education with verbal and written material and guidebook.  Patient learns that lab test reflects one measurement whose values change over time and are influenced by many factors, including medication, stress, sleep, exercise, food, hydration, pre-existing medical conditions, and more. It is recommended to use the knowledge from this video to become more involved with your lab results and evaluate your numbers to speak with your doctor.   Diseases of Our Time - Overview  Clinical staff conducted group or individual video education with verbal and written material and guidebook.  Patient learns that according to the CDC, 50%  to 70% of chronic diseases (such as obesity, type 2 diabetes, elevated lipids, hypertension, and heart disease) are avoidable through lifestyle improvements including healthier food choices, listening to satiety cues, and increased physical activity.  Sleep Disorders Clinical staff conducted group or individual video education with verbal and written material and guidebook.  Patient learns how good quality and duration of sleep are important to overall health and well-being. Patient also learns about sleep disorders and how they impact health along with recommendations to address them, including discussing with a physician.  Nutrition  Dining Out - Part 2 Clinical staff conducted group or individual video education with verbal and written material and guidebook.  Patient learns how to plan ahead and communicate in order to maximize their  dining experience in a healthy and nutritious manner. Included are recommended food choices based on the type of restaurant the patient is visiting.   Fueling a Banker conducted group or individual video education with verbal and written material and guidebook.  There is a strong connection between our food choices and our health. Diseases like obesity and type 2 diabetes are very prevalent and are in large-part due to lifestyle choices. The Pritikin Eating Plan provides plenty of food and hunger-curbing satisfaction. It is easy to follow, affordable, and helps reduce health risks.  Menu Workshop  Clinical staff conducted group or individual video education with verbal and written material and guidebook.  Patient learns that restaurant meals can sabotage health goals because they are often packed with calories, fat, sodium, and sugar. Recommendations include strategies to plan ahead and to communicate with the manager, chef, or server to help order a healthier meal.  Planning Your Eating Strategy  Clinical staff conducted group or individual video education with verbal and written material and guidebook.  Patient learns about the Pritikin Eating Plan and its benefit of reducing the risk of disease. The Pritikin Eating Plan does not focus on calories. Instead, it emphasizes high-quality, nutrient-rich foods. By knowing the characteristics of the foods, we choose, we can determine their calorie density and make informed decisions.  Targeting Your Nutrition Priorities  Clinical staff conducted group or individual video education with verbal and written material and guidebook.  Patient learns that lifestyle habits have a tremendous impact on disease risk and progression. This video provides eating and physical activity recommendations based on your personal health goals, such as reducing LDL cholesterol, losing weight, preventing or controlling type 2 diabetes, and reducing high  blood pressure.  Vitamins and Minerals  Clinical staff conducted group or individual video education with verbal and written material and guidebook.  Patient learns different ways to obtain key vitamins and minerals, including through a recommended healthy diet. It is important to discuss all supplements you take with your doctor.   Healthy Mind-Set    Smoking Cessation  Clinical staff conducted group or individual video education with verbal and written material and guidebook.  Patient learns that cigarette smoking and tobacco addiction pose a serious health risk which affects millions of people. Stopping smoking will significantly reduce the risk of heart disease, lung disease, and many forms of cancer. Recommended strategies for quitting are covered, including working with your doctor to develop a successful plan.  Culinary   Becoming a Set designer conducted group or individual video education with verbal and written material and guidebook.  Patient learns that cooking at home can be healthy, cost-effective, quick, and puts them in control. Keys to cooking healthy  recipes will include looking at your recipe, assessing your equipment needs, planning ahead, making it simple, choosing cost-effective seasonal ingredients, and limiting the use of added fats, salts, and sugars.  Cooking - Breakfast and Snacks  Clinical staff conducted group or individual video education with verbal and written material and guidebook.  Patient learns how important breakfast is to satiety and nutrition through the entire day. Recommendations include key foods to eat during breakfast to help stabilize blood sugar levels and to prevent overeating at meals later in the day. Planning ahead is also a key component.  Cooking - Educational psychologist conducted group or individual video education with verbal and written material and guidebook.  Patient learns eating strategies to improve overall  health, including an approach to cook more at home. Recommendations include thinking of animal protein as a side on your plate rather than center stage and focusing instead on lower calorie dense options like vegetables, fruits, whole grains, and plant-based proteins, such as beans. Making sauces in large quantities to freeze for later and leaving the skin on your vegetables are also recommended to maximize your experience.  Cooking - Healthy Salads and Dressing Clinical staff conducted group or individual video education with verbal and written material and guidebook.  Patient learns that vegetables, fruits, whole grains, and legumes are the foundations of the Pritikin Eating Plan. Recommendations include how to incorporate each of these in flavorful and healthy salads, and how to create homemade salad dressings. Proper handling of ingredients is also covered. Cooking - Soups and [employer] - Soups and Desserts Clinical staff conducted group or individual video education with verbal and written material and guidebook.  Patient learns that Pritikin soups and desserts make for easy, nutritious, and delicious snacks and meal components that are low in sodium, fat, sugar, and calorie density, while high in vitamins, minerals, and filling fiber. Recommendations include simple and healthy ideas for soups and desserts.   Overview     The Pritikin Solution Program Overview Clinical staff conducted group or individual video education with verbal and written material and guidebook.  Patient learns that the results of the Pritikin Program have been documented in more than 100 articles published in peer-reviewed journals, and the benefits include reducing risk factors for (and, in some cases, even reversing) high cholesterol, high blood pressure, type 2 diabetes, obesity, and more! An overview of the three key pillars of the Pritikin Program will be covered: eating well, doing regular exercise, and having a  healthy mind-set.  WORKSHOPS  Exercise: Exercise Basics: Building Your Action Plan Clinical staff led group instruction and group discussion with PowerPoint presentation and patient guidebook. To enhance the learning environment the use of posters, models and videos may be added. At the conclusion of this workshop, patients will comprehend the difference between physical activity and exercise, as well as the benefits of incorporating both, into their routine. Patients will understand the FITT (Frequency, Intensity, Time, and Type) principle and how to use it to build an exercise action plan. In addition, safety concerns and other considerations for exercise and cardiac rehab will be addressed by the presenter. The purpose of this lesson is to promote a comprehensive and effective weekly exercise routine in order to improve patients' overall level of fitness.   Managing Heart Disease: Your Path to a Healthier Heart Clinical staff led group instruction and group discussion with PowerPoint presentation and patient guidebook. To enhance the learning environment the use of posters, models and  videos may be added.At the conclusion of this workshop, patients will understand the anatomy and physiology of the heart. Additionally, they will understand how Pritikin's three pillars impact the risk factors, the progression, and the management of heart disease.  The purpose of this lesson is to provide a high-level overview of the heart, heart disease, and how the Pritikin lifestyle positively impacts risk factors.  Exercise Biomechanics Clinical staff led group instruction and group discussion with PowerPoint presentation and patient guidebook. To enhance the learning environment the use of posters, models and videos may be added. Patients will learn how the structural parts of their bodies function and how these functions impact their daily activities, movement, and exercise. Patients will learn how to  promote a neutral spine, learn how to manage pain, and identify ways to improve their physical movement in order to promote healthy living. The purpose of this lesson is to expose patients to common physical limitations that impact physical activity. Participants will learn practical ways to adapt and manage aches and pains, and to minimize their effect on regular exercise. Patients will learn how to maintain good posture while sitting, walking, and lifting.  Balance Training and Fall Prevention  Clinical staff led group instruction and group discussion with PowerPoint presentation and patient guidebook. To enhance the learning environment the use of posters, models and videos may be added. At the conclusion of this workshop, patients will understand the importance of their sensorimotor skills (vision, proprioception, and the vestibular system) in maintaining their ability to balance as they age. Patients will apply a variety of balancing exercises that are appropriate for their current level of function. Patients will understand the common causes for poor balance, possible solutions to these problems, and ways to modify their physical environment in order to minimize their fall risk. The purpose of this lesson is to teach patients about the importance of maintaining balance as they age and ways to minimize their risk of falling.  WORKSHOPS   Nutrition:  Fueling a Ship broker led group instruction and group discussion with PowerPoint presentation and patient guidebook. To enhance the learning environment the use of posters, models and videos may be added. Patients will review the foundational principles of the Pritikin Eating Plan and understand what constitutes a serving size in each of the food groups. Patients will also learn Pritikin-friendly foods that are better choices when away from home and review make-ahead meal and snack options. Calorie density will be reviewed and  applied to three nutrition priorities: weight maintenance, weight loss, and weight gain. The purpose of this lesson is to reinforce (in a group setting) the key concepts around what patients are recommended to eat and how to apply these guidelines when away from home by planning and selecting Pritikin-friendly options. Patients will understand how calorie density may be adjusted for different weight management goals.  Mindful Eating  Clinical staff led group instruction and group discussion with PowerPoint presentation and patient guidebook. To enhance the learning environment the use of posters, models and videos may be added. Patients will briefly review the concepts of the Pritikin Eating Plan and the importance of low-calorie dense foods. The concept of mindful eating will be introduced as well as the importance of paying attention to internal hunger signals. Triggers for non-hunger eating and techniques for dealing with triggers will be explored. The purpose of this lesson is to provide patients with the opportunity to review the basic principles of the Pritikin Eating Plan, discuss the value of  eating mindfully and how to measure internal cues of hunger and fullness using the Hunger Scale. Patients will also discuss reasons for non-hunger eating and learn strategies to use for controlling emotional eating.  Targeting Your Nutrition Priorities Clinical staff led group instruction and group discussion with PowerPoint presentation and patient guidebook. To enhance the learning environment the use of posters, models and videos may be added. Patients will learn how to determine their genetic susceptibility to disease by reviewing their family history. Patients will gain insight into the importance of diet as part of an overall healthy lifestyle in mitigating the impact of genetics and other environmental insults. The purpose of this lesson is to provide patients with the opportunity to assess their personal  nutrition priorities by looking at their family history, their own health history and current risk factors. Patients will also be able to discuss ways of prioritizing and modifying the Pritikin Eating Plan for their highest risk areas  Menu  Clinical staff led group instruction and group discussion with PowerPoint presentation and patient guidebook. To enhance the learning environment the use of posters, models and videos may be added. Using menus brought in from [employer], or printed from [employer], patients will apply the Pritikin dining out guidelines that were presented in the [employer] video. Patients will also be able to practice these guidelines in a variety of provided scenarios. The purpose of this lesson is to provide patients with the opportunity to practice hands-on learning of the Pritikin Dining Out guidelines with actual menus and practice scenarios.  Label Reading Clinical staff led group instruction and group discussion with PowerPoint presentation and patient guidebook. To enhance the learning environment the use of posters, models and videos may be added. Patients will review and discuss the Pritikin label reading guidelines presented in Pritikin's Label Reading Educational series video. Using fool labels brought in from local grocery stores and markets, patients will apply the label reading guidelines and determine if the packaged food meet the Pritikin guidelines. The purpose of this lesson is to provide patients with the opportunity to review, discuss, and practice hands-on learning of the Pritikin Label Reading guidelines with actual packaged food labels. Cooking School  Pritikin's [employer] are designed to teach patients ways to prepare quick, simple, and affordable recipes at home. The importance of nutrition's role in chronic disease risk reduction is reflected in its emphasis in the overall Pritikin program. By learning how to prepare  essential core Pritikin Eating Plan recipes, patients will increase control over what they eat; be able to customize the flavor of foods without the use of added salt, sugar, or fat; and improve the quality of the food they consume. By learning a set of core recipes which are easily assembled, quickly prepared, and affordable, patients are more likely to prepare more healthy foods at home. These workshops focus on convenient breakfasts, simple entres, side dishes, and desserts which can be prepared with minimal effort and are consistent with nutrition recommendations for cardiovascular risk reduction. Cooking [employer] are taught by a Armed forces logistics/support/administrative officer (RD) who has been trained by the [employer]. The chef or RD has a clear understanding of the importance of minimizing - if not completely eliminating - added fat, sugar, and sodium in recipes. Throughout the series of Cooking School Workshop sessions, patients will learn about healthy ingredients and efficient methods of cooking to build confidence in their capability to prepare    [employer] weekly  topics:  Adding Flavor- Sodium-Free  Fast and Healthy Breakfasts  Powerhouse Plant-Based Proteins  Satisfying Salads and Dressings  Simple Sides and Sauces  International Cuisine-Spotlight on the Blue Zones  Delicious Desserts  Savory Soups  Efficiency Cooking - Meals in a Snap  Tasty Appetizers and Snacks  Comforting Weekend Breakfasts  One-Pot Wonders   Fast Evening Meals  Landscape architect Your Pritikin Plate  WORKSHOPS   Healthy Mindset (Psychosocial):  Focused Goals, Sustainable Changes Clinical staff led group instruction and group discussion with PowerPoint presentation and patient guidebook. To enhance the learning environment the use of posters, models and videos may be added. Patients will be able to apply effective goal setting strategies to establish at least one personal goal, and  then take consistent, meaningful action toward that goal. They will learn to identify common barriers to achieving personal goals and develop strategies to overcome them. Patients will also gain an understanding of how our mind-set can impact our ability to achieve goals and the importance of cultivating a positive and growth-oriented mind-set. The purpose of this lesson is to provide patients with a deeper understanding of how to set and achieve personal goals, as well as the tools and strategies needed to overcome common obstacles which may arise along the way.  From Head to Heart: The Power of a Healthy Outlook  Clinical staff led group instruction and group discussion with PowerPoint presentation and patient guidebook. To enhance the learning environment the use of posters, models and videos may be added. Patients will be able to recognize and describe the impact of emotions and mood on physical health. They will discover the importance of self-care and explore self-care practices which may work for them. Patients will also learn how to utilize the 4 C's to cultivate a healthier outlook and better manage stress and challenges. The purpose of this lesson is to demonstrate to patients how a healthy outlook is an essential part of maintaining good health, especially as they continue their cardiac rehab journey.  Healthy Sleep for a Healthy Heart Clinical staff led group instruction and group discussion with PowerPoint presentation and patient guidebook. To enhance the learning environment the use of posters, models and videos may be added. At the conclusion of this workshop, patients will be able to demonstrate knowledge of the importance of sleep to overall health, well-being, and quality of life. They will understand the symptoms of, and treatments for, common sleep disorders. Patients will also be able to identify daytime and nighttime behaviors which impact sleep, and they will be able to apply these  tools to help manage sleep-related challenges. The purpose of this lesson is to provide patients with a general overview of sleep and outline the importance of quality sleep. Patients will learn about a few of the most common sleep disorders. Patients will also be introduced to the concept of "sleep hygiene," and discover ways to self-manage certain sleeping problems through simple daily behavior changes. Finally, the workshop will motivate patients by clarifying the links between quality sleep and their goals of heart-healthy living.   Recognizing and Reducing Stress Clinical staff led group instruction and group discussion with PowerPoint presentation and patient guidebook. To enhance the learning environment the use of posters, models and videos may be added. At the conclusion of this workshop, patients will be able to understand the types of stress reactions, differentiate between acute and chronic stress, and recognize the impact that chronic stress has on their health. They will also be able  to apply different coping mechanisms, such as reframing negative self-talk. Patients will have the opportunity to practice a variety of stress management techniques, such as deep abdominal breathing, progressive muscle relaxation, and/or guided imagery.  The purpose of this lesson is to educate patients on the role of stress in their lives and to provide healthy techniques for coping with it.  Learning Barriers/Preferences:  Learning Barriers/Preferences - 02/08/23 1324       Learning Barriers/Preferences   Learning Barriers Sight   wears glasses   Learning Preferences Audio;Computer/Internet;Group Instruction;Individual Instruction;Pictoral;Skilled Demonstration;Verbal Instruction;Video             Education Topics:  Knowledge Questionnaire Score:  Knowledge Questionnaire Score - 02/08/23 1325       Knowledge Questionnaire Score   Pre Score 22/24             Core Components/Risk  Factors/Patient Goals at Admission:  Personal Goals and Risk Factors at Admission - 02/08/23 1224       Core Components/Risk Factors/Patient Goals on Admission    Weight Management Yes;Weight Loss    Intervention Weight Management: Develop a combined nutrition and exercise program designed to reach desired caloric intake, while maintaining appropriate intake of nutrient and fiber, sodium and fats, and appropriate energy expenditure required for the weight goal.;Weight Management: Provide education and appropriate resources to help participant work on and attain dietary goals.;Weight Management/Obesity: Establish reasonable short term and long term weight goals.    Admit Weight 175 lb 14.8 oz (79.8 kg)    Expected Outcomes Weight Loss: Understanding of general recommendations for a balanced deficit meal plan, which promotes 1-2 lb weight loss per week and includes a negative energy balance of [phone] kcal/d;Short Term: Continue to assess and modify interventions until short term weight is achieved;Long Term: Adherence to nutrition and physical activity/exercise program aimed toward attainment of established weight goal;Understanding of distribution of calorie intake throughout the day with the consumption of 4-5 meals/snacks;Understanding recommendations for meals to include 15-35% energy as protein, 25-35% energy from fat, 35-60% energy from carbohydrates, less than 200mg  of dietary cholesterol, 20-35 gm of total fiber daily    Hypertension Yes    Intervention Provide education on lifestyle modifcations including regular physical activity/exercise, weight management, moderate sodium restriction and increased consumption of fresh fruit, vegetables, and low fat dairy, alcohol moderation, and smoking cessation.;Monitor prescription use compliance.    Expected Outcomes Short Term: Continued assessment and intervention until BP is < 140/29mm HG in hypertensive participants. < 130/71mm HG in hypertensive  participants with diabetes, heart failure or chronic kidney disease.;Long Term: Maintenance of blood pressure at goal levels.    Lipids Yes    Intervention Provide education and support for participant on nutrition & aerobic/resistive exercise along with prescribed medications to achieve LDL 70mg , HDL >40mg .    Expected Outcomes Short Term: Participant states understanding of desired cholesterol values and is compliant with medications prescribed. Participant is following exercise prescription and nutrition guidelines.;Long Term: Cholesterol controlled with medications as prescribed, with individualized exercise RX and with personalized nutrition plan. Value goals: LDL < 70mg , HDL > 40 mg.             Core Components/Risk Factors/Patient Goals Review:   Goals and Risk Factor Review     Row Name 02/14/23 1416 03/01/23 1359 03/29/23 1316         Core Components/Risk Factors/Patient Goals Review   Personal Goals Review Weight Management/Obesity;Hypertension;Lipids Weight Management/Obesity;Hypertension;Lipids Weight Management/Obesity;Hypertension;Lipids     Review [name] [name] started cardiac  rehab on 02/14/23 and did well with exercise. Vital signs and CBg's were stable [name] [name] started cardiac rehab on 02/14/23 and doing  well with exercise. Vital signs have been stable [name] [name] continues to  do well with exercise. Vital signs remain stable     Expected Outcomes [name] [name] will continue to participate in cardiac rehab for exercise, nutrition and lifestyle modifications [name] [name] will continue to participate in cardiac rehab for exercise, nutrition and lifestyle modifications [name] [name] will continue to participate in cardiac rehab for exercise, nutrition and lifestyle modifications              Core Components/Risk Factors/Patient Goals at Discharge (Final Review):   Goals and Risk Factor Review - 03/29/23 1316       Core Components/Risk Factors/Patient Goals Review   Personal Goals Review Weight  Management/Obesity;Hypertension;Lipids    Review Kaleesha continues to  do well with exercise. Vital signs remain stable    Expected Outcomes [name] [name] will continue to participate in cardiac rehab for exercise, nutrition and lifestyle modifications             ITP Comments:  ITP Comments     Row Name 02/08/23 1204 02/14/23 1413 03/01/23 1356 03/29/23 1315     ITP Comments [name] Magic, MD: Medical Director.  Intorduction to the [employer] / Intensive Cardiac Rehab.  Initial orientation packet reviewed with the patient. 30 Day ITP Review. Kemya started cardiac rehab on 02/14/23 and did well with exercise 30 Day ITP Review. Marsa has good attendance and participation with exercise at cardiac rehab. 30 Day ITP Review. Daliah continues to have  good attendance and participation with exercise at cardiac rehab.             Comments: See ITP comments.

## 2023-03-30 ENCOUNTER — Encounter (HOSPITAL_COMMUNITY)
Admission: RE | Admit: 2023-03-30 | Discharge: 2023-03-30 | Disposition: A | Discharge status: Home / Self Care | Payer: Medicare Other | Source: Ambulatory Visit | Attending: Interventional Cardiology | Admitting: Interventional Cardiology

## 2023-03-30 DIAGNOSIS — I10 Essential (primary) hypertension: Secondary | ICD-10-CM | POA: Diagnosis not present

## 2023-03-30 DIAGNOSIS — Z955 Presence of coronary angioplasty implant and graft: Secondary | ICD-10-CM

## 2023-03-30 DIAGNOSIS — Z48812 Encounter for surgical aftercare following surgery on the circulatory system: Secondary | ICD-10-CM | POA: Diagnosis not present

## 2023-03-30 DIAGNOSIS — J449 Chronic obstructive pulmonary disease, unspecified: Secondary | ICD-10-CM | POA: Diagnosis not present

## 2023-04-01 ENCOUNTER — Encounter (HOSPITAL_COMMUNITY)
Admission: RE | Admit: 2023-04-01 | Discharge: 2023-04-01 | Disposition: A | Discharge status: Home / Self Care | Payer: Medicare Other | Source: Ambulatory Visit | Attending: Interventional Cardiology

## 2023-04-01 DIAGNOSIS — Z955 Presence of coronary angioplasty implant and graft: Secondary | ICD-10-CM | POA: Diagnosis not present

## 2023-04-01 DIAGNOSIS — J449 Chronic obstructive pulmonary disease, unspecified: Secondary | ICD-10-CM | POA: Diagnosis not present

## 2023-04-01 DIAGNOSIS — I10 Essential (primary) hypertension: Secondary | ICD-10-CM | POA: Diagnosis not present

## 2023-04-01 DIAGNOSIS — Z48812 Encounter for surgical aftercare following surgery on the circulatory system: Secondary | ICD-10-CM | POA: Diagnosis not present

## 2023-04-04 ENCOUNTER — Encounter (HOSPITAL_COMMUNITY): Payer: Medicare Other

## 2023-04-04 ENCOUNTER — Telehealth (HOSPITAL_COMMUNITY): Payer: Self-pay

## 2023-04-04 NOTE — Telephone Encounter (Signed)
Pt called and stated " I will not be there this week. I am heading back home to [place]. My best friend just passed away."

## 2023-04-06 ENCOUNTER — Encounter (HOSPITAL_COMMUNITY): Payer: Medicare Other

## 2023-04-06 ENCOUNTER — Ambulatory Visit: Payer: Medicare Other

## 2023-04-07 ENCOUNTER — Ambulatory Visit: Payer: Medicare Other

## 2023-04-08 ENCOUNTER — Encounter (HOSPITAL_COMMUNITY): Payer: Medicare Other

## 2023-04-11 ENCOUNTER — Ambulatory Visit: Payer: Medicare Other | Attending: Nurse Practitioner

## 2023-04-11 ENCOUNTER — Other Ambulatory Visit (HOSPITAL_COMMUNITY): Payer: Self-pay | Admitting: Nurse Practitioner

## 2023-04-11 ENCOUNTER — Encounter (HOSPITAL_COMMUNITY)
Admission: RE | Admit: 2023-04-11 | Discharge: 2023-04-11 | Disposition: A | Discharge status: Home / Self Care | Payer: Medicare Other | Source: Ambulatory Visit | Attending: Interventional Cardiology | Admitting: Interventional Cardiology

## 2023-04-11 DIAGNOSIS — I110 Hypertensive heart disease with heart failure: Secondary | ICD-10-CM | POA: Diagnosis not present

## 2023-04-11 DIAGNOSIS — I10 Essential (primary) hypertension: Secondary | ICD-10-CM

## 2023-04-11 DIAGNOSIS — J449 Chronic obstructive pulmonary disease, unspecified: Secondary | ICD-10-CM

## 2023-04-11 DIAGNOSIS — I5042 Chronic combined systolic (congestive) and diastolic (congestive) heart failure: Secondary | ICD-10-CM

## 2023-04-11 DIAGNOSIS — I2510 Atherosclerotic heart disease of native coronary artery without angina pectoris: Secondary | ICD-10-CM | POA: Diagnosis not present

## 2023-04-11 DIAGNOSIS — Z955 Presence of coronary angioplasty implant and graft: Secondary | ICD-10-CM | POA: Insufficient documentation

## 2023-04-11 DIAGNOSIS — Z23 Encounter for immunization: Secondary | ICD-10-CM | POA: Diagnosis not present

## 2023-04-12 LAB — BASIC METABOLIC PANEL
BUN/Creatinine Ratio: 18 (ref 12–28)
BUN: 14 mg/dL (ref 8–27)
CO2: 23 mmol/L (ref 20–29)
Calcium: 10.4 mg/dL — ABNORMAL HIGH (ref 8.7–10.3)
Chloride: 103 mmol/L (ref 96–106)
Creatinine, Ser: 0.76 mg/dL (ref 0.57–1.00)
Glucose: 94 mg/dL (ref 70–99)
Potassium: 4.3 mmol/L (ref 3.5–5.2)
Sodium: 139 mmol/L (ref 134–144)
eGFR: 85 mL/min/{1.73_m2} (ref >59)

## 2023-04-13 ENCOUNTER — Encounter (HOSPITAL_COMMUNITY)
Admission: RE | Admit: 2023-04-13 | Discharge: 2023-04-13 | Disposition: A | Discharge status: Home / Self Care | Payer: Medicare Other | Source: Ambulatory Visit | Attending: Interventional Cardiology | Admitting: Interventional Cardiology

## 2023-04-13 DIAGNOSIS — Z955 Presence of coronary angioplasty implant and graft: Secondary | ICD-10-CM

## 2023-04-13 DIAGNOSIS — J449 Chronic obstructive pulmonary disease, unspecified: Secondary | ICD-10-CM | POA: Diagnosis not present

## 2023-04-15 ENCOUNTER — Encounter (HOSPITAL_COMMUNITY)
Admission: RE | Admit: 2023-04-15 | Discharge: 2023-04-15 | Disposition: A | Discharge status: Home / Self Care | Payer: Medicare Other | Source: Ambulatory Visit | Attending: Interventional Cardiology

## 2023-04-15 ENCOUNTER — Telehealth: Payer: Self-pay | Admitting: Nurse Practitioner

## 2023-04-15 ENCOUNTER — Telehealth: Payer: Self-pay | Admitting: Cardiology

## 2023-04-15 DIAGNOSIS — J449 Chronic obstructive pulmonary disease, unspecified: Secondary | ICD-10-CM | POA: Diagnosis not present

## 2023-04-15 DIAGNOSIS — Z955 Presence of coronary angioplasty implant and graft: Secondary | ICD-10-CM

## 2023-04-15 DIAGNOSIS — I2510 Atherosclerotic heart disease of native coronary artery without angina pectoris: Secondary | ICD-10-CM

## 2023-04-15 MED ORDER — CLOPIDOGREL BISULFATE 75 MG PO TABS: 75.0000 mg | ORAL_TABLET | Freq: Every day | ORAL | 3 refills | Status: DC

## 2023-04-15 MED ORDER — SPIRONOLACTONE 25 MG PO TABS: 12.5000 mg | ORAL_TABLET | Freq: Every day | ORAL | 3 refills | Status: DC

## 2023-04-15 NOTE — Telephone Encounter (Signed)
Pt's medication was sent to pt's pharmacy as requested. Confirmation received.  °

## 2023-04-15 NOTE — Telephone Encounter (Signed)
Three weeks ago patient started noticing more fatigued with the increase in [place], She wants to know if she can take something like a B vitamin or something that can help with her energy being low.   She is willing to try Spirolactone if this will help her heart failure. Patient's BP at cardiac rehab has been good. Will send back to Robin Searing NP for advisement.

## 2023-04-15 NOTE — Telephone Encounter (Signed)
Left message for patient to call back  

## 2023-04-15 NOTE — Telephone Encounter (Signed)
I think B vitamin or B12 would be fine.  Please start patient on spironolactone 12.5 mg daily check BMET in 2 weeks.   Robin Searing, NP   Called patient with recommendations. Patient verbalized understanding.

## 2023-04-15 NOTE — Telephone Encounter (Signed)
*  STAT* If patient is at the pharmacy, call can be transferred to refill team.   1. Which medications need to be refilled? (please list name of each medication and dose if known)  clopidogrel (PLAVIX) 75 MG tablet  2. Which pharmacy/location (including street and city if local pharmacy) is medication to be sent to? clopidogrel (PLAVIX) 75 MG tablet  3. Do they need a 30 day or 90 day supply? 90

## 2023-04-15 NOTE — Telephone Encounter (Signed)
Pt states she is letting Robin Searing about her BP to see if she needs to change medications. BP is running from 110-120 and less than 80 on the bottom. Please advise

## 2023-04-15 NOTE — Telephone Encounter (Signed)
Pt is returning call to nurse.  °

## 2023-04-18 ENCOUNTER — Encounter (HOSPITAL_COMMUNITY)
Admission: RE | Admit: 2023-04-18 | Discharge: 2023-04-18 | Disposition: A | Discharge status: Home / Self Care | Payer: Medicare Other | Source: Ambulatory Visit | Attending: Interventional Cardiology

## 2023-04-18 DIAGNOSIS — Z955 Presence of coronary angioplasty implant and graft: Secondary | ICD-10-CM | POA: Diagnosis not present

## 2023-04-18 DIAGNOSIS — J449 Chronic obstructive pulmonary disease, unspecified: Secondary | ICD-10-CM | POA: Diagnosis not present

## 2023-04-19 NOTE — Progress Notes (Signed)
Reviewed home exercise Rx with patient today.  Encouraged warm-up, cool-down, and stretching. Reviewed THRR of  60 - 121 and keeping RPE between 11-13. Encouraged to hydrate with activity.  Reviewed weather parameters for temperature and humidity for safe exercise outdoors. Reviewed S/S to terminate exercise and when to call 911 vs MD. Reviewed the use of NTG and pt was encouraged to carry at all times. Pt encouraged to always carry a cell phone for safety when exercising outdoors. Pt verbalized understanding of the home exercise Rx and was provided a copy.   Sasan Wilkie MS, ACSM-CEP, CCRP  

## 2023-04-20 ENCOUNTER — Encounter (HOSPITAL_COMMUNITY)
Admission: RE | Admit: 2023-04-20 | Discharge: 2023-04-20 | Disposition: A | Discharge status: Home / Self Care | Payer: Medicare Other | Source: Ambulatory Visit | Attending: Interventional Cardiology | Admitting: Interventional Cardiology

## 2023-04-20 DIAGNOSIS — Z955 Presence of coronary angioplasty implant and graft: Secondary | ICD-10-CM | POA: Diagnosis not present

## 2023-04-20 DIAGNOSIS — J449 Chronic obstructive pulmonary disease, unspecified: Secondary | ICD-10-CM | POA: Diagnosis not present

## 2023-04-22 ENCOUNTER — Encounter (HOSPITAL_COMMUNITY)
Admission: RE | Admit: 2023-04-22 | Discharge: 2023-04-22 | Disposition: A | Discharge status: Home / Self Care | Payer: Medicare Other | Source: Ambulatory Visit | Attending: Interventional Cardiology

## 2023-04-22 DIAGNOSIS — Z955 Presence of coronary angioplasty implant and graft: Secondary | ICD-10-CM

## 2023-04-22 DIAGNOSIS — J449 Chronic obstructive pulmonary disease, unspecified: Secondary | ICD-10-CM | POA: Diagnosis not present

## 2023-04-25 ENCOUNTER — Encounter (HOSPITAL_COMMUNITY)
Admission: RE | Admit: 2023-04-25 | Discharge: 2023-04-25 | Disposition: A | Discharge status: Home / Self Care | Payer: Medicare Other | Source: Ambulatory Visit | Attending: Interventional Cardiology

## 2023-04-25 DIAGNOSIS — Z955 Presence of coronary angioplasty implant and graft: Secondary | ICD-10-CM

## 2023-04-25 DIAGNOSIS — J449 Chronic obstructive pulmonary disease, unspecified: Secondary | ICD-10-CM | POA: Diagnosis not present

## 2023-04-26 NOTE — Progress Notes (Signed)
Cardiac Individual Treatment Plan  Patient Details  Name: Ronnita Seja MRN: [id number] Date of Birth: 06/26/1955 Referring Provider:   Flowsheet Row INTENSIVE CARDIAC REHAB ORIENT from 02/08/2023 in [facility]  Referring Provider Lance Muss, MD       Initial Encounter Date:  Flowsheet Row INTENSIVE CARDIAC REHAB ORIENT from 02/08/2023 in [facility]  Date 02/08/23       Visit Diagnosis: 01/26/23 Status post coronary artery stent placement  Patient's Home Medications on Admission:  Current Outpatient Medications:    aspirin EC 81 MG tablet, Take 1 tablet (81 mg total) by mouth daily. Swallow whole., Disp: 30 tablet, Rfl: 12   clopidogrel (PLAVIX) 75 MG tablet, Take 1 tablet (75 mg total) by mouth daily., Disp: 90 tablet, Rfl: 3   loratadine (CLARITIN) 10 MG tablet, Take 10 mg by mouth daily as needed for rhinitis or allergies., Disp: , Rfl:    nitroGLYCERIN (NITROSTAT) 0.4 MG SL tablet, Place 1 tablet (0.4 mg total) under the tongue every 5 (five) minutes as needed for chest pain., Disp: 25 tablet, Rfl: 6   rosuvastatin (CRESTOR) 20 MG tablet, Take 1 tablet (20 mg total) by mouth daily., Disp: 90 tablet, Rfl: 3   sacubitril-valsartan (ENTRESTO) 97-103 MG, Take 1 tablet by mouth 2 (two) times daily., Disp: 60 tablet, Rfl: 5   spironolactone (ALDACTONE) 25 MG tablet, Take 0.5 tablets (12.5 mg total) by mouth daily., Disp: 45 tablet, Rfl: 3   Suvorexant (BELSOMRA) 10 MG TABS, Take 1 tablet (10 mg total) by mouth at bedtime as needed., Disp: 30 tablet, Rfl: 2   Tiotropium Bromide-Olodaterol (STIOLTO RESPIMAT) 2.5-2.5 MCG/ACT AERS, Inhale 2 puffs into the lungs daily., Disp: , Rfl:    VENTOLIN HFA 108 (90 Base) MCG/ACT inhaler, INHALE 1 PUFF BY MOUTH THREE TIMES DAILY AS NEEDED FOR WHEEZING FOR SHORTNESS OF BREATH, Disp: 18 g, Rfl: 0   Vitamin D, Ergocalciferol,  (DRISDOL) 50000 units CAPS capsule, Take 50,000 Units by mouth every 7 (seven) days., Disp: , Rfl:   Past Medical History: Past Medical History:  Diagnosis Date   Arthritis of hand 12/04/2021   CAD (coronary artery disease)    Central scotoma, 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 she was given albuterol inhaler & had steroid with surg.     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, transient, takes Verapimil for migraine & BP control    Mild obstructive sleep apnea 07/28/2021   Pain of right thumb 06/24/2017   Plantar fasciitis of left foot 03/12/2022   PONV (postoperative nausea and vomiting)    Primary cancer of right lower lobe of lung 04/17/2014   Shortness of breath 09/17/2019   Sprain of lateral ligament of ankle joint 09/11/2021   Subjective memory complaints 07/16/2022   Tenosynovitis of both hands 12/04/2021   Thyroid  nodule    benign by biopsy   Tightness of left gastrocnemius muscle 03/12/2022   Transient visual loss 01/25/2012   Varicose veins    left leg   Vitamin D deficiency    Wears glasses     Tobacco Use: Social History   Tobacco Use  Smoking Status Former   Current packs/day: 0.00   Average packs/day: 0.5 packs/day for 40.0 years (20.0 ttl pk-yrs)   Types: Cigarettes   Start date: 03/17/1973   Quit date: 03/17/2013   Years since quitting: 10.1  Smokeless Tobacco Never    Labs: Review Flowsheet  More data exists       Latest Ref Rng & Units 10/11/2017 04/30/2019 04/21/2020 11/14/2020 01/26/2023  Labs for ITP Cardiac and Pulmonary Rehab  Cholestrol 100 - 199 mg/dL 161  096  045  409  -  LDL (calc) 0 - 99 mg/dL 55  53  44  48  -  HDL-C >39 mg/dL 73  82  87  68  -  Trlycerides 0 - 149 mg/dL 54  64  60  46  -  PH, Arterial 7.35 - 7.45 - - - - 7.351   PCO2 arterial 32 - 48 mmHg - - - - 41.0   Bicarbonate 20.0 - 28.0 mmol/L - - - - 23.8  23.5  22.7   TCO2 22 - 32 mmol/L - - - - 25  25  24    Acid-base deficit 0.0 - 2.0 mmol/L - - - - 2.0  2.0  3.0   O2 Saturation % - - - - 75  72  91     Details       Multiple values from one day are sorted in reverse-chronological order         Capillary Blood Glucose: Lab Results  Component Value Date   GLUCAP 119 (H) 04/23/2014     Exercise Target Goals: Exercise Program Goal: Individual exercise prescription set using results from initial 6 min walk test and THRR while considering  patient's activity barriers and safety.   Exercise Prescription Goal: Initial exercise prescription builds to 30-45 minutes a day of aerobic activity, 2-3 days per week.  Home exercise guidelines will be given to patient during program as part of exercise prescription that the participant will acknowledge.  Activity Barriers & Risk Stratification:  Activity Barriers & Cardiac Risk Stratification - 02/08/23 1320       Activity Barriers & Cardiac Risk Stratification   Activity Barriers Balance Concerns;Arthritis;Joint Problems;Back Problems;Deconditioning;Shortness of Breath;Neck/Spine Problems    Cardiac Risk Stratification High             6 Minute Walk:  6 Minute Walk     Row Name 02/08/23 1143         6 Minute Walk   Phase Initial     Distance 1374 feet     Walk Time 6 minutes     # of Rest Breaks 0     MPH 2.6     METS 3.4     RPE 11     Perceived Dyspnea  1     VO2 Peak 11.8     Symptoms Yes (comment)     Comments SOB, RPD = 1     Resting HR 79 bpm      Resting BP 126/72     Resting Oxygen Saturation  97 %     Exercise Oxygen Saturation  during 6 min walk 96 %  Max Ex. HR 119 bpm     Max Ex. BP 150/80     2 Minute Post BP 132/70       Interval Oxygen   Interval Oxygen? Yes     Baseline Oxygen Saturation % 97 %     1 Minute Oxygen Saturation % 94 %     1 Minute Liters of Oxygen 0 L     2 Minute Oxygen Saturation % 97 %     2 Minute Liters of Oxygen 0 L     3 Minute Oxygen Saturation % 95 %     3 Minute Liters of Oxygen 0 L     4 Minute Oxygen Saturation % 96 %     4 Minute Liters of Oxygen 0 L     5 Minute Oxygen Saturation % 95 %     5 Minute Liters of Oxygen 0 L     6 Minute Oxygen Saturation % 96 %     6 Minute Liters of Oxygen 0 L     2 Minute Post Oxygen Saturation % 99 %              Oxygen Initial Assessment:   Oxygen Re-Evaluation:   Oxygen Discharge (Final Oxygen Re-Evaluation):   Initial Exercise Prescription:  Initial Exercise Prescription - 02/08/23 1300       Date of Initial Exercise RX and Referring Provider   Date 02/08/23    Referring Provider Lance Muss, MD    Expected Discharge Date 04/23/21      NuStep   Level 2    SPM 80    Minutes 15    METs 3.4      Recumbant Elliptical   Level 2    RPM 50    Watts 25    Minutes 15    METs 3.4      Prescription Details   Frequency (times per week) 3    Duration Progress to 30 minutes of continuous aerobic without signs/symptoms of physical distress      Intensity   THRR 40-80% of Max Heartrate 60-121    Ratings of Perceived Exertion 11-13    Perceived Dyspnea 0-4      Progression   Progression Continue progressive overload as per policy without signs/symptoms or physical distress.      Resistance Training   Training Prescription Yes    Weight 3 lbs    Reps 10-15             Perform Capillary Blood Glucose checks as needed.  Exercise Prescription Changes:   Exercise Prescription Changes     Row Name 02/14/23 1400  02/25/23 1400 03/23/23 1500 04/13/23 1500 04/18/23 1500     Response to Exercise   Blood Pressure (Admit) 130/60 106/66 112/62 118/68 134/70   Blood Pressure (Exercise) 156/84 132/70 -- -- --   Blood Pressure (Exit) 112/62 118/70 118/64 110/70 118/80   Heart Rate (Admit) 97 bpm 92 bpm 100 bpm 76 bpm 96 bpm   Heart Rate (Exercise) 115 bpm 117 bpm 124 bpm 120 bpm 124 bpm   Heart Rate (Exit) 100 bpm 97 bpm 99 bpm 85 bpm 99 bpm   Oxygen Saturation (Admit) 95 % -- -- -- --   Oxygen Saturation (Exercise) 98 % -- -- -- --   Oxygen Saturation (Exit) 96 % -- -- -- --   Rating of Perceived Exertion (Exercise) 12 11.5 11.5 11 11.5   Perceived Dyspnea (Exercise) 1 -- -- -- --  Symptoms SOB, RPD = 1 none None None None   Comments Pt's first day in the CRP2 program Reviewed METs Reviewed METs and goals Reviewed METs Reviewed HERx   Duration Continue with 30 min of aerobic exercise without signs/symptoms of physical distress. Continue with 30 min of aerobic exercise without signs/symptoms of physical distress. Continue with 30 min of aerobic exercise without signs/symptoms of physical distress. Continue with 30 min of aerobic exercise without signs/symptoms of physical distress. Continue with 30 min of aerobic exercise without signs/symptoms of physical distress.   Intensity THRR unchanged THRR unchanged THRR unchanged THRR unchanged --     Progression   Progression Continue to progress workloads to maintain intensity without signs/symptoms of physical distress. -- Continue to progress workloads to maintain intensity without signs/symptoms of physical distress. Continue to progress workloads to maintain intensity without signs/symptoms of physical distress. Continue to progress workloads to maintain intensity without signs/symptoms of physical distress.   Average METs 2.3 -- 3.4 2.7 --     Resistance Training   Training Prescription Yes Yes No No Yes   Weight 3 lbs 3 No weights on Wednesdays -- 3 lbs    Reps 10-15 10-15 -- -- 10-15   Time 10 Minutes 10 Minutes -- -- 10 Minutes     Interval Training   Interval Training No -- No No No     NuStep   Level 2 2 3 4 4    SPM 90 73 90 93 106   Minutes 15 15 15 15 15    METs 2.5 2.4 2.4 2.4 3     Recumbant Elliptical   Level 2 2 3 4 4    RPM 51 58 59 32 --   Watts 64 75 93 60 37   Minutes 15 15 15 15 15    METs 2.1 3.5 4.4 3 1.8     Home Exercise Plan   Plans to continue exercise at -- -- -- -- Home (comment)   Frequency -- -- -- -- Add 2 additional days to program exercise sessions.   Initial Home Exercises Provided -- -- -- -- 04/18/23            Exercise Comments:   Exercise Comments     Row Name 02/14/23 1426 02/25/23 1443 03/23/23 1500 04/13/23 1500 04/18/23 1500   Exercise Comments Pt's first day in the CRP2 program. Pt excercised with no complaints other than some mild SOB. Reviewed METs with pt today Reviewed METs and goals with Marjean Donna today. No changes in modalites or workloads today. Reviewed METs. Pt's MET levels are inconsistant due to how her knees feel that day. Pt is trying to walk at home but again its depends on how her knees fee. Will review HERx soon. Reviewed home exercise Rx with patient today. Pt does some walking at home but not consistently due to her knees and also the fatigue from her medications. Pt is on max does of Entresto and she voices that some days she is very tired. I encouraged patient to be as active as she can be. More active on her good days, and rest if she is feeling very fatigued. Pt voices she has talked to her Dr and will start on a B or B12 vitamin in hopes it improves her energy level.  Pt wants to join the PREP program when she finishes the CRP2 program. Pt verbalized understanding of the home exricse Rx and was provided a copy.  Exercise Goals and Review:   Exercise Goals     Row Name 02/08/23 1208             Exercise Goals   Increase Physical Activity Yes        Intervention Provide advice, education, support and counseling about physical activity/exercise needs.;Develop an individualized exercise prescription for aerobic and resistive training based on initial evaluation findings, risk stratification, comorbidities and participant's personal goals.       Expected Outcomes Short Term: Attend rehab on a regular basis to increase amount of physical activity.;Long Term: Add in home exercise to make exercise part of routine and to increase amount of physical activity.;Long Term: Exercising regularly at least 3-5 days a week.       Increase Strength and Stamina Yes       Intervention Provide advice, education, support and counseling about physical activity/exercise needs.;Develop an individualized exercise prescription for aerobic and resistive training based on initial evaluation findings, risk stratification, comorbidities and participant's personal goals.       Expected Outcomes Short Term: Increase workloads from initial exercise prescription for resistance, speed, and METs.;Short Term: Perform resistance training exercises routinely during rehab and add in resistance training at home;Long Term: Improve cardiorespiratory fitness, muscular endurance and strength as measured by increased METs and functional capacity ( )       Able to understand and use rate of perceived exertion (RPE) scale Yes       Intervention Provide education and explanation on how to use RPE scale       Expected Outcomes Short Term: Able to use RPE daily in rehab to express subjective intensity level;Long Term:  Able to use RPE to guide intensity level when exercising independently       Able to understand and use Dyspnea scale Yes       Intervention Provide education and explanation on how to use Dyspnea scale       Expected Outcomes Short Term: Able to use Dyspnea scale daily in rehab to express subjective sense of shortness of breath during exertion;Long Term: Able to use Dyspnea scale to  guide intensity level when exercising independently       Knowledge and understanding of Target Heart Rate Range (THRR) Yes       Intervention Provide education and explanation of THRR including how the numbers were predicted and where they are located for reference       Expected Outcomes Short Term: Able to state/look up THRR;Short Term: Able to use daily as guideline for intensity in rehab;Long Term: Able to use THRR to govern intensity when exercising independently       Understanding of Exercise Prescription Yes       Intervention Provide education, explanation, and written materials on patient's individual exercise prescription       Expected Outcomes Short Term: Able to explain program exercise prescription;Long Term: Able to explain home exercise prescription to exercise independently                Exercise Goals Re-Evaluation :  Exercise Goals Re-Evaluation     Row Name 02/14/23 1425 02/25/23 1442 03/23/23 1500         Exercise Goal Re-Evaluation   Exercise Goals Review Increase Physical Activity;Understanding of Exercise Prescription;Increase Strength and Stamina;Knowledge and understanding of Target Heart Rate Range (THRR);Able to understand and use Dyspnea scale;Able to understand and use rate of perceived exertion (RPE) scale -- Increase Physical Activity;Understanding of Exercise Prescription;Increase Strength and Stamina;Knowledge and understanding of  Target Heart Rate Range (THRR);Able to understand and use Dyspnea scale;Able to understand and use rate of perceived exertion (RPE) scale     Comments Pt's first day in the CRP2 program, Pt understnads the RPE scale, THRR and exercise Rx Reviewed METs with pt today Reviewed METs and goals with patient today. Peak METs = 4.4. Pt is making good progress. Pt is making progress on her goals of weight loss and eating healthier. Pt would like to do more exerise and we will dicusee home exercise Rx soon.     Expected Outcomes Will  continue to monitor patient and progress exercise workloads as tolerated. Will continue to watch pt closely and progress as tolerated. Will continue to monitor patient and progress exercise workloads as tolerated.              Discharge Exercise Prescription (Final Exercise Prescription Changes):  Exercise Prescription Changes - 04/18/23 1500       Response to Exercise   Blood Pressure (Admit) 134/70    Blood Pressure (Exit) 118/80    Heart Rate (Admit) 96 bpm    Heart Rate (Exercise) 124 bpm    Heart Rate (Exit) 99 bpm    Rating of Perceived Exertion (Exercise) 11.5    Symptoms None    Comments Reviewed HERx    Duration Continue with 30 min of aerobic exercise without signs/symptoms of physical distress.      Progression   Progression Continue to progress workloads to maintain intensity without signs/symptoms of physical distress.      Resistance Training   Training Prescription Yes    Weight 3 lbs    Reps 10-15    Time 10 Minutes      Interval Training   Interval Training No      NuStep   Level 4    SPM 106    Minutes 15    METs 3      Recumbant Elliptical   Level 4    Watts 37    Minutes 15    METs 1.8      Home Exercise Plan   Plans to continue exercise at Home (comment)    Frequency Add 2 additional days to program exercise sessions.    Initial Home Exercises Provided 04/18/23             Nutrition:  Target Goals: Understanding of nutrition guidelines, daily intake of sodium 1500mg , cholesterol 200mg , calories 30% from fat and 7% or less from saturated fats, daily to have 5 or more servings of fruits and vegetables.  Biometrics:  Pre Biometrics - 02/08/23 1209       Pre Biometrics   % Body Fat 41.9 %             Post Biometrics - 02/08/23 1209        Post  Biometrics   Waist Circumference 41 inches    Hip Circumference 46 inches    Waist to Hip Ratio 0.89 %    Triceps Skinfold 31 mm    Grip Strength 34 kg    Flexibility 13.5 in     Single Leg Stand 6.37 seconds             Nutrition Therapy Plan and Nutrition Goals:  Nutrition Therapy & Goals - 04/15/23 1330       Nutrition Therapy   Diet Heart Healthy Diet    Drug/Food Interactions Statins/Certain Fruits      Personal Nutrition Goals   Nutrition Goal Patient to  identify strategies for reducing cardiovascular risk by attending the [facility] education and nutrition series weekly.   goal in action.   Personal Goal #2 Patient to improve diet quality by using the plate method as a guide for meal planning to include lean protein/plant protein, fruits, vegetables, whole grains, nonfat dairy as part of a well-balanced diet.   goal in action.   Personal Goal #3 Patient to reduce sodium to 1500mg  per day.   goal in progress.   Comments Goals in progress. Asialynn reports motivation to improve eating habits. She continues to attend to the [facility] education and nutrition series regularly. She has medical history of CAD, CHF, HTN, COPD. She is down 4.4# since starting with our program. Sheh as started making many dietary changes including increased dietary fiber and reduced saturated fat. She continues to work on mindful eating habits and reducing snacking on refined carbohydrates. LDL remains well controlled <70 (57). Patient will benefit from participation in intensive cardiac rehab for nutrition, exercise, and lifestyle modification.      Intervention Plan   Intervention Prescribe, educate and counsel regarding individualized specific dietary modifications aiming towards targeted core components such as weight, hypertension, lipid management, diabetes, heart failure and other comorbidities.;Nutrition handout(s) given to patient.    Expected Outcomes Short Term Goal: Understand basic principles of dietary content, such as calories, fat, sodium, cholesterol and nutrients.;Long Term Goal: Adherence to prescribed nutrition plan.             Nutrition Assessments:   Nutrition Assessments - 02/15/23 1151       Rate Your Plate Scores   Pre Score 45            MEDIFICTS Score Key: >=70 Need to make dietary changes  40-70 Heart Healthy Diet <= 40 Therapeutic Level Cholesterol Diet   Flowsheet Row INTENSIVE CARDIAC REHAB from 02/14/2023 in [facility]  Picture Your Plate Total Score on Admission 45      Picture Your Plate Scores: <32 Unhealthy dietary pattern with much room for improvement. 41-50 Dietary pattern unlikely to meet recommendations for good health and room for improvement. 51-60 More healthful dietary pattern, with some room for improvement.  >60 Healthy dietary pattern, although there may be some specific behaviors that could be improved.    Nutrition Goals Re-Evaluation:  Nutrition Goals Re-Evaluation     Row Name 02/14/23 1320 03/17/23 0906 04/15/23 1330         Goals   Current Weight 175 lb 14.8 oz (79.8 kg) 172 lb 9.9 oz (78.3 kg) 171 lb 8.3 oz (77.8 kg)     Comment HDL 79 no new labs; most recent labs HDL 79 no new labs; most recent labs HDL 79, LDL 57     Expected Outcome Agathe reports motvation to improve eating habits. She has medical history of CAD, CHF, HTN, COPD. Patient will benefit from participation in intensive cardiac rehab for nutrition, exercise, and lifestyle modification. Goals in progress. Massa reports motvation to improve eating habits. She continues to attend to the [facility] education and nutrition series regularly. She has medical history of CAD, CHF, HTN, COPD. She is down 3.3# since starting with our program. She continues follow-up with cardiology for CAD, CHF. Patient will benefit from participation in intensive cardiac rehab for nutrition, exercise, and lifestyle modification. Goals in progress. Marth reports motivation to improve eating habits. She continues to attend to the [facility] education and nutrition series regularly. She has  medical  history of CAD, CHF, HTN, COPD. She is down 4.4# since starting with our program. Sheh as started making many dietary changes including increased dietary fiber and reduced saturated fat. She continues to work on mindful eating habits and reducing snacking on refined carbohydrates. LDL remains well controlled <70 (57). Patient will benefit from participation in intensive cardiac rehab for nutrition, exercise, and lifestyle modification.              Nutrition Goals Re-Evaluation:  Nutrition Goals Re-Evaluation     Row Name 02/14/23 1320 03/17/23 0906 04/15/23 1330         Goals   Current Weight 175 lb 14.8 oz (79.8 kg) 172 lb 9.9 oz (78.3 kg) 171 lb 8.3 oz (77.8 kg)     Comment HDL 79 no new labs; most recent labs HDL 79 no new labs; most recent labs HDL 79, LDL 57     Expected Outcome Lekiesha reports motvation to improve eating habits. She has medical history of CAD, CHF, HTN, COPD. Patient will benefit from participation in intensive cardiac rehab for nutrition, exercise, and lifestyle modification. Goals in progress. Shavna reports motvation to improve eating habits. She continues to attend to the [facility] education and nutrition series regularly. She has medical history of CAD, CHF, HTN, COPD. She is down 3.3# since starting with our program. She continues follow-up with cardiology for CAD, CHF. Patient will benefit from participation in intensive cardiac rehab for nutrition, exercise, and lifestyle modification. Goals in progress. Prudy reports motivation to improve eating habits. She continues to attend to the [facility] education and nutrition series regularly. She has medical history of CAD, CHF, HTN, COPD. She is down 4.4# since starting with our program. Sheh as started making many dietary changes including increased dietary fiber and reduced saturated fat. She continues to work on mindful eating habits and reducing snacking on refined carbohydrates. LDL remains well controlled <70 (57).  Patient will benefit from participation in intensive cardiac rehab for nutrition, exercise, and lifestyle modification.              Nutrition Goals Discharge (Final Nutrition Goals Re-Evaluation):  Nutrition Goals Re-Evaluation - 04/15/23 1330       Goals   Current Weight 171 lb 8.3 oz (77.8 kg)    Comment no new labs; most recent labs HDL 79, LDL 57    Expected Outcome Goals in progress. Corabell reports motivation to improve eating habits. She continues to attend to the [facility] education and nutrition series regularly. She has medical history of CAD, CHF, HTN, COPD. She is down 4.4# since starting with our program. Sheh as started making many dietary changes including increased dietary fiber and reduced saturated fat. She continues to work on mindful eating habits and reducing snacking on refined carbohydrates. LDL remains well controlled <70 (57). Patient will benefit from participation in intensive cardiac rehab for nutrition, exercise, and lifestyle modification.             Psychosocial: Target Goals: Acknowledge presence or absence of significant depression and/or stress, maximize coping skills, provide positive support system. Participant is able to verbalize types and ability to use techniques and skills needed for reducing stress and depression.  Initial Review & Psychosocial Screening:  Initial Psych Review & Screening - 02/08/23 1210       Initial Review   Current issues with Current Sleep Concerns   Pt has mild sleep apena but has not been Rx a CPAP     Family Dynamics  Good Support System? Yes   Has daughter for support, and neice     Barriers   Psychosocial barriers to participate in program There are no identifiable barriers or psychosocial needs.      Screening Interventions   Interventions Encouraged to exercise             Quality of Life Scores:  Quality of Life - 02/08/23 1323       Quality of Life   Select Quality of Life      Quality of  Life Scores   Health/Function Pre 28.62 %    Socioeconomic Pre 30 %    Psych/Spiritual Pre 30 %    Family Pre 30 %    GLOBAL Pre 29.38 %            Scores of 19 and below usually indicate a poorer quality of life in these areas.  A difference of  2-3 points is a clinically meaningful difference.  A difference of 2-3 points in the total score of the Quality of Life Index has been associated with significant improvement in overall quality of life, self-image, physical symptoms, and general health in studies assessing change in quality of life.  PHQ-9: Review Flowsheet  More data exists      02/08/2023 04/23/2021 02/16/2021 07/10/2018 06/05/2014  Depression screen PHQ 2/9  Decreased Interest 0 0 0 0 0  Down, Depressed, Hopeless 0 0 0 0 0  PHQ - 2 Score 0 0 0 0 0  Altered sleeping 1 1 3  0 -  Tired, decreased energy 1 1 0 1 -  Change in appetite 0 0 0 0 -  Feeling bad or failure about yourself  0 0 0 0 -  Trouble concentrating 0 0 0 0 -  Moving slowly or fidgety/restless 0 0 0 0 -  Suicidal thoughts 0 0 0 0 -  PHQ-9 Score 2 2 - 1 -  Difficult doing work/chores Not difficult at all Not difficult at all Not difficult at all Not difficult at all -    Details           Interpretation of Total Score  Total Score Depression Severity:  1-4 = Minimal depression, 5-9 = Mild depression, 10-14 = Moderate depression, 15-19 = Moderately severe depression, 20-27 = Severe depression   Psychosocial Evaluation and Intervention:   Psychosocial Re-Evaluation:  Psychosocial Re-Evaluation     Row Name 02/14/23 1415 03/01/23 1356 03/29/23 1316 04/26/23 1239       Psychosocial Re-Evaluation   Current issues with None Identified None Identified None Identified None Identified    Comments Memorie did not voiced any concerns or stressors on her first day of exercise Breighlyn has not voiced any concerns or stressors during exercise at cardiac rehab. Britanee has not voiced any concerns or stressors  during exercise at cardiac rehab. Kelce continues not to voice concerns or stressors during exercise at cardiac rehab.    Interventions Stress management education;Encouraged to attend Cardiac Rehabilitation for the exercise;Relaxation education Stress management education;Encouraged to attend Cardiac Rehabilitation for the exercise;Relaxation education Stress management education;Encouraged to attend Cardiac Rehabilitation for the exercise;Relaxation education Stress management education;Encouraged to attend Cardiac Rehabilitation for the exercise;Relaxation education    Continue Psychosocial Services  No Follow up required No Follow up required No Follow up required No Follow up required             Psychosocial Discharge (Final Psychosocial Re-Evaluation):  Psychosocial Re-Evaluation - 04/26/23 1239  Psychosocial Re-Evaluation   Current issues with None Identified    Comments Dazzlyn continues not to voice concerns or stressors during exercise at cardiac rehab.    Interventions Stress management education;Encouraged to attend Cardiac Rehabilitation for the exercise;Relaxation education    Continue Psychosocial Services  No Follow up required             Vocational Rehabilitation: Provide vocational rehab assistance to qualifying candidates.   Vocational Rehab Evaluation & Intervention:  Vocational Rehab - 02/08/23 1224       Initial Vocational Rehab Evaluation & Intervention   Assessment shows need for Vocational Rehabilitation No   Pt is reitred.            Education: Education Goals: Education classes will be provided on a weekly basis, covering required topics. Participant will state understanding/return demonstration of topics presented.    Education     Row Name 02/14/23 1300     Education   Cardiac Education Topics Pritikin   Glass blower/designer Nutrition   Nutrition Workshop Label Reading   Instruction  Review Code 1- Verbalizes Understanding   Class Start Time 1145   Class Stop Time 1234   Class Time Calculation (min) 49 min    Row Name 02/16/23 1400     Education   Cardiac Education Topics Pritikin   Orthoptist   Educator Dietitian   Weekly Topic [employer] Desserts   Instruction Review Code 1- Verbalizes Understanding   Class Start Time 1145   Class Stop Time 1230   Class Time Calculation (min) 45 min    Row Name 02/18/23 1300     Education   Cardiac Education Topics Pritikin   Licensed conveyancer Nutrition   Nutrition Other  Label Reading   Instruction Review Code 1- Verbalizes Understanding   Class Start Time 1150   Class Stop Time 1235   Class Time Calculation (min) 45 min    Row Name 02/21/23 1500     Education   Cardiac Education Topics Pritikin   Select Workshops     Workshops   Educator Exercise Physiologist   Select Psychosocial   Psychosocial Workshop Recognizing and Reducing Stress   Instruction Review Code 1- Verbalizes Understanding   Class Start Time 1148   Class Stop Time 1237   Class Time Calculation (min) 49 min    Row Name 02/23/23 1400     Education   Cardiac Education Topics Pritikin   Orthoptist   Educator Dietitian   Weekly Topic Tasty Appetizers and Snacks   Instruction Review Code 1- Verbalizes Understanding   Class Start Time 1145   Class Stop Time 1225   Class Time Calculation (min) 40 min    Row Name 02/25/23 1200     Education   Cardiac Education Topics Pritikin   Nurse, children's Exercise Physiologist   Select Nutrition   Nutrition Calorie Density   Instruction Review Code 1- Verbalizes Understanding   Class Start Time 1155   Class Stop Time 1235   Class Time Calculation (min) 40 min    Row Name 02/28/23 1300     Education   Cardiac Education Topics Pritikin   [employer]  Educator Exercise Physiologist   Select Exercise   Exercise Workshop Exercise Basics: Diplomatic Services operational officer   Instruction Review Code 1- Verbalizes Understanding   Class Start Time 1155   Class Stop Time 1237   Class Time Calculation (min) 42 min    Row Name 03/02/23 1500     Education   Cardiac Education Topics Pritikin   Customer service manager   Weekly Topic Efficiency Cooking - Meals in a Snap   Instruction Review Code 1- Verbalizes Understanding   Class Start Time 1145   Class Stop Time 1224   Class Time Calculation (min) 39 min    Row Name 03/07/23 1300     Education   Cardiac Education Topics Pritikin   Glass blower/designer Nutrition   Nutrition Workshop Targeting Your Nutrition Priorities   Instruction Review Code 1- Verbalizes Understanding   Class Start Time 1145   Class Stop Time 1225   Class Time Calculation (min) 40 min    Row Name 03/09/23 1200     Education   Cardiac Education Topics Pritikin   Customer service manager   Weekly Topic One-Pot Wonders   Instruction Review Code 1- Verbalizes Understanding   Class Start Time 1145   Class Stop Time 1225   Class Time Calculation (min) 40 min    Row Name 03/11/23 1200     Education   Cardiac Education Topics Pritikin   Hospital doctor Education   General Education Hypertension and Heart Disease   Instruction Review Code 1- Verbalizes Understanding   Class Start Time 1157   Class Stop Time 1240   Class Time Calculation (min) 43 min    Row Name 03/14/23 1300     Education   Cardiac Education Topics Pritikin   Licensed conveyancer Nutrition   Nutrition Dining Out - Part 1   Instruction Review Code 1- Verbalizes Understanding    Class Start Time 1145   Class Stop Time 1230   Class Time Calculation (min) 45 min    Row Name 03/23/23 1500     Education   Cardiac Education Topics Pritikin   Customer service manager   Weekly Topic Fast Evening Meals   Instruction Review Code 1- Verbalizes Understanding   Class Start Time 1145   Class Stop Time 1230   Class Time Calculation (min) 45 min    Row Name 03/25/23 1400     Education   Cardiac Education Topics Pritikin   Licensed conveyancer Nutrition   Nutrition Vitamins and Minerals   Instruction Review Code 1- Verbalizes Understanding   Class Start Time 1145   Class Stop Time 1226   Class Time Calculation (min) 41 min    Row Name 03/28/23 1300     Education   Cardiac Education Topics Pritikin   Glass blower/designer Nutrition   Nutrition Workshop Fueling a Forensic psychologist   Instruction Review Code 1- Teaching laboratory technician Start Time 1145  Class Stop Time 1230   Class Time Calculation (min) 45 min    Row Name 03/30/23 1500     Education   Cardiac Education Topics Pritikin   Customer service manager   Weekly Topic International Cuisine- Spotlight on the [facility] Zones   Instruction Review Code 1- Verbalizes Understanding   Class Start Time 1145   Class Stop Time 1221   Class Time Calculation (min) 36 min    Row Name 04/01/23 1200     Education   Cardiac Education Topics Pritikin   Psychologist, forensic Exercise Education   Exercise Education Improving Performance   Instruction Review Code 1- Verbalizes Understanding   Class Start Time 1150   Class Stop Time 1230   Class Time Calculation (min) 40 min    Row Name 04/11/23 1400     Education   Cardiac Education Topics Pritikin   Select Workshops     Workshops    Educator Exercise Physiologist   Select Exercise   Exercise Workshop Managing Heart Disease: Your Path to a Healthier Heart   Instruction Review Code 1- Verbalizes Understanding   Class Start Time 1135   Class Stop Time 1219   Class Time Calculation (min) 44 min    Row Name 04/13/23 1500     Education   Cardiac Education Topics Pritikin   Orthoptist   Educator Dietitian   Weekly Topic Powerhouse Plant-Based Proteins   Instruction Review Code 1- Verbalizes Understanding   Class Start Time 1145   Class Stop Time 1222   Class Time Calculation (min) 37 min    Row Name 04/15/23 1200     Education   Cardiac Education Topics Pritikin   Hospital doctor Education   General Education Hypertension and Heart Disease   Instruction Review Code 1- Verbalizes Understanding   Class Start Time 1153   Class Stop Time 1235   Class Time Calculation (min) 42 min    Row Name 04/18/23 1600     Education   Cardiac Education Topics Pritikin   Geographical information systems officer Psychosocial   Psychosocial Workshop From Head to Heart: The Power of a Healthy Outlook   Instruction Review Code 1- Verbalizes Understanding   Class Start Time 1150   Class Stop Time 1235   Class Time Calculation (min) 45 min    Row Name 04/20/23 1300     Education   Cardiac Education Topics Pritikin   Customer service manager   Weekly Topic Adding Flavor - Sodium-Free   Instruction Review Code 1- Verbalizes Understanding   Class Start Time 1140   Class Stop Time 1220   Class Time Calculation (min) 40 min    Row Name 04/22/23 1200     Education   Cardiac Education Topics Pritikin   Hospital doctor Education   General Education Heart Disease Risk Reduction    Instruction Review Code 1- Verbalizes Understanding   Class Start Time 1157   Class Stop Time 1234   Class Time Calculation (min) 37  min    Row Name 04/25/23 1300     Education   Cardiac Education Topics Pritikin   Geographical information systems officer Exercise   Exercise Workshop Location manager and Fall Prevention   Instruction Review Code 1- Verbalizes Understanding   Class Start Time 1150   Class Stop Time 1240   Class Time Calculation (min) 50 min            Core Videos: Exercise    Move It!  Clinical staff conducted group or individual video education with verbal and written material and guidebook.  Patient learns the recommended Pritikin exercise program. Exercise with the goal of living a long, healthy life. Some of the health benefits of exercise include controlled diabetes, healthier blood pressure levels, improved cholesterol levels, improved heart and lung capacity, improved sleep, and better body composition. Everyone should speak with their doctor before starting or changing an exercise routine.  Biomechanical Limitations Clinical staff conducted group or individual video education with verbal and written material and guidebook.  Patient learns how biomechanical limitations can impact exercise and how we can mitigate and possibly overcome limitations to have an impactful and balanced exercise routine.  Body Composition Clinical staff conducted group or individual video education with verbal and written material and guidebook.  Patient learns that body composition (ratio of muscle mass to fat mass) is a key component to assessing overall fitness, rather than body weight alone. Increased fat mass, especially visceral belly fat, can put [place] at increased risk for metabolic syndrome, type 2 diabetes, heart disease, and even death. It is recommended to combine diet and exercise (cardiovascular and resistance training) to improve your  body composition. Seek guidance from your physician and exercise physiologist before implementing an exercise routine.  Exercise Action Plan Clinical staff conducted group or individual video education with verbal and written material and guidebook.  Patient learns the recommended strategies to achieve and enjoy long-term exercise adherence, including variety, self-motivation, self-efficacy, and positive decision making. Benefits of exercise include fitness, good health, weight management, more energy, better sleep, less stress, and overall well-being.  Medical   Heart Disease Risk Reduction Clinical staff conducted group or individual video education with verbal and written material and guidebook.  Patient learns our heart is our most vital organ as it circulates oxygen, nutrients, white blood cells, and hormones throughout the entire body, and carries waste away. Data supports a plant-based eating plan like the Pritikin Program for its effectiveness in slowing progression of and reversing heart disease. The video provides a number of recommendations to address heart disease.   Metabolic Syndrome and Belly Fat  Clinical staff conducted group or individual video education with verbal and written material and guidebook.  Patient learns what metabolic syndrome is, how it leads to heart disease, and how one can reverse it and keep it from coming back. You have metabolic syndrome if you have 3 of the following 5 criteria: abdominal obesity, high blood pressure, high triglycerides, low HDL cholesterol, and high blood sugar.  Hypertension and Heart Disease Clinical staff conducted group or individual video education with verbal and written material and guidebook.  Patient learns that high blood pressure, or hypertension, is very common in the [place]. Hypertension is largely due to excessive salt intake, but other important risk factors include being overweight, physical inactivity, drinking too  much alcohol, smoking, and not eating enough potassium from fruits and vegetables. High blood pressure is  a leading risk factor for heart attack, stroke, congestive heart failure, dementia, kidney failure, and premature death. Long-term effects of excessive salt intake include stiffening of the arteries and thickening of heart muscle and organ damage. Recommendations include ways to reduce hypertension and the risk of heart disease.  Diseases of Our Time - Focusing on Diabetes Clinical staff conducted group or individual video education with verbal and written material and guidebook.  Patient learns why the best way to stop diseases of our time is prevention, through food and other lifestyle changes. Medicine (such as prescription pills and surgeries) is often only a Band-Aid on the problem, not a long-term solution. Most common diseases of our time include obesity, type 2 diabetes, hypertension, heart disease, and cancer. The Pritikin Program is recommended and has been proven to help reduce, reverse, and/or prevent the damaging effects of metabolic syndrome.  Nutrition   Overview of the Pritikin Eating Plan  Clinical staff conducted group or individual video education with verbal and written material and guidebook.  Patient learns about the Pritikin Eating Plan for disease risk reduction. The Pritikin Eating Plan emphasizes a wide variety of unrefined, minimally-processed carbohydrates, like fruits, vegetables, whole grains, and legumes. Go, Caution, and Stop food choices are explained. Plant-based and lean animal proteins are emphasized. Rationale provided for low sodium intake for blood pressure control, low added sugars for blood sugar stabilization, and low added fats and oils for coronary artery disease risk reduction and weight management.  Calorie Density  Clinical staff conducted group or individual video education with verbal and written material and guidebook.  Patient learns about calorie  density and how it impacts the Pritikin Eating Plan. Knowing the characteristics of the food you choose will help you decide whether those foods will lead to weight gain or weight loss, and whether you want to consume more or less of them. Weight loss is usually a side effect of the Pritikin Eating Plan because of its focus on low calorie-dense foods.  Label Reading  Clinical staff conducted group or individual video education with verbal and written material and guidebook.  Patient learns about the Pritikin recommended label reading guidelines and corresponding recommendations regarding calorie density, added sugars, sodium content, and whole grains.  Dining Out - Part 1  Clinical staff conducted group or individual video education with verbal and written material and guidebook.  Patient learns that restaurant meals can be sabotaging because they can be so high in calories, fat, sodium, and/or sugar. Patient learns recommended strategies on how to positively address this and avoid unhealthy pitfalls.  Facts on Fats  Clinical staff conducted group or individual video education with verbal and written material and guidebook.  Patient learns that lifestyle modifications can be just as effective, if not more so, as many medications for lowering your risk of heart disease. A Pritikin lifestyle can help to reduce your risk of inflammation and atherosclerosis (cholesterol build-up, or plaque, in the artery walls). Lifestyle interventions such as dietary choices and physical activity address the cause of atherosclerosis. A review of the types of fats and their impact on blood cholesterol levels, along with dietary recommendations to reduce fat intake is also included.  Nutrition Action Plan  Clinical staff conducted group or individual video education with verbal and written material and guidebook.  Patient learns how to incorporate Pritikin recommendations into their lifestyle. Recommendations include  planning and keeping personal health goals in mind as an important part of their success.  Healthy Mind-Set  Healthy Minds, Bodies, Hearts  Clinical staff conducted group or individual video education with verbal and written material and guidebook.  Patient learns how to identify when they are stressed. Video will discuss the impact of that stress, as well as the many benefits of stress management. Patient will also be introduced to stress management techniques. The way we think, act, and feel has an impact on our hearts.  How Our Thoughts Can Heal Our Hearts  Clinical staff conducted group or individual video education with verbal and written material and guidebook.  Patient learns that negative thoughts can cause depression and anxiety. This can result in negative lifestyle behavior and serious health problems. Cognitive behavioral therapy is an effective method to help control our thoughts in order to change and improve our emotional outlook.  Additional Videos:  Exercise    Improving Performance  Clinical staff conducted group or individual video education with verbal and written material and guidebook.  Patient learns to use a non-linear approach by alternating intensity levels and lengths of time spent exercising to help burn more calories and lose more body fat. Cardiovascular exercise helps improve heart health, metabolism, hormonal balance, blood sugar control, and recovery from fatigue. Resistance training improves strength, endurance, balance, coordination, reaction time, metabolism, and muscle mass. Flexibility exercise improves circulation, posture, and balance. Seek guidance from your physician and exercise physiologist before implementing an exercise routine and learn your capabilities and proper form for all exercise.  Introduction to Yoga  Clinical staff conducted group or individual video education with verbal and written material and guidebook.  Patient learns about yoga, a  discipline of the coming together of mind, breath, and body. The benefits of yoga include improved flexibility, improved range of motion, better posture and core strength, increased lung function, weight loss, and positive self-image. Yoga's heart health benefits include lowered blood pressure, healthier heart rate, decreased cholesterol and triglyceride levels, improved immune function, and reduced stress. Seek guidance from your physician and exercise physiologist before implementing an exercise routine and learn your capabilities and proper form for all exercise.  Medical   Aging: Enhancing Your Quality of Life  Clinical staff conducted group or individual video education with verbal and written material and guidebook.  Patient learns key strategies and recommendations to stay in good physical health and enhance quality of life, such as prevention strategies, having an advocate, securing a Health Care Proxy and Power of Attorney, and keeping a list of medications and system for tracking them. It also discusses how to avoid risk for bone loss.  Biology of Weight Control  Clinical staff conducted group or individual video education with verbal and written material and guidebook.  Patient learns that weight gain occurs because we consume more calories than we burn (eating more, moving less). Even if your body weight is normal, you may have higher ratios of fat compared to muscle mass. Too much body fat puts you at increased risk for cardiovascular disease, heart attack, stroke, type 2 diabetes, and obesity-related cancers. In addition to exercise, following the Pritikin Eating Plan can help reduce your risk.  Decoding Lab Results  Clinical staff conducted group or individual video education with verbal and written material and guidebook.  Patient learns that lab test reflects one measurement whose values change over time and are influenced by many factors, including medication, stress, sleep, exercise,  food, hydration, pre-existing medical conditions, and more. It is recommended to use the knowledge from this video to become more involved with your lab results  and evaluate your numbers to speak with your doctor.   Diseases of Our Time - Overview  Clinical staff conducted group or individual video education with verbal and written material and guidebook.  Patient learns that according to the CDC, 50% to 70% of chronic diseases (such as obesity, type 2 diabetes, elevated lipids, hypertension, and heart disease) are avoidable through lifestyle improvements including healthier food choices, listening to satiety cues, and increased physical activity.  Sleep Disorders Clinical staff conducted group or individual video education with verbal and written material and guidebook.  Patient learns how good quality and duration of sleep are important to overall health and well-being. Patient also learns about sleep disorders and how they impact health along with recommendations to address them, including discussing with a physician.  Nutrition  Dining Out - Part 2 Clinical staff conducted group or individual video education with verbal and written material and guidebook.  Patient learns how to plan ahead and communicate in order to maximize their dining experience in a healthy and nutritious manner. Included are recommended food choices based on the type of restaurant the patient is visiting.   Fueling a Banker conducted group or individual video education with verbal and written material and guidebook.  There is a strong connection between our food choices and our health. Diseases like obesity and type 2 diabetes are very prevalent and are in large-part due to lifestyle choices. The Pritikin Eating Plan provides plenty of food and hunger-curbing satisfaction. It is easy to follow, affordable, and helps reduce health risks.  Menu Workshop  Clinical staff conducted group or individual  video education with verbal and written material and guidebook.  Patient learns that restaurant meals can sabotage health goals because they are often packed with calories, fat, sodium, and sugar. Recommendations include strategies to plan ahead and to communicate with the manager, chef, or server to help order a healthier meal.  Planning Your Eating Strategy  Clinical staff conducted group or individual video education with verbal and written material and guidebook.  Patient learns about the Pritikin Eating Plan and its benefit of reducing the risk of disease. The Pritikin Eating Plan does not focus on calories. Instead, it emphasizes high-quality, nutrient-rich foods. By knowing the characteristics of the foods, we choose, we can determine their calorie density and make informed decisions.  Targeting Your Nutrition Priorities  Clinical staff conducted group or individual video education with verbal and written material and guidebook.  Patient learns that lifestyle habits have a tremendous impact on disease risk and progression. This video provides eating and physical activity recommendations based on your personal health goals, such as reducing LDL cholesterol, losing weight, preventing or controlling type 2 diabetes, and reducing high blood pressure.  Vitamins and Minerals  Clinical staff conducted group or individual video education with verbal and written material and guidebook.  Patient learns different ways to obtain key vitamins and minerals, including through a recommended healthy diet. It is important to discuss all supplements you take with your doctor.   Healthy Mind-Set    Smoking Cessation  Clinical staff conducted group or individual video education with verbal and written material and guidebook.  Patient learns that cigarette smoking and tobacco addiction pose a serious health risk which affects millions of people. Stopping smoking will significantly reduce the risk of heart  disease, lung disease, and many forms of cancer. Recommended strategies for quitting are covered, including working with your doctor to develop a successful plan.  Culinary  Becoming a Set designer conducted group or individual video education with verbal and written material and guidebook.  Patient learns that cooking at home can be healthy, cost-effective, quick, and puts them in control. Keys to cooking healthy recipes will include looking at your recipe, assessing your equipment needs, planning ahead, making it simple, choosing cost-effective seasonal ingredients, and limiting the use of added fats, salts, and sugars.  Cooking - Breakfast and Snacks  Clinical staff conducted group or individual video education with verbal and written material and guidebook.  Patient learns how important breakfast is to satiety and nutrition through the entire day. Recommendations include key foods to eat during breakfast to help stabilize blood sugar levels and to prevent overeating at meals later in the day. Planning ahead is also a key component.  Cooking - Educational psychologist conducted group or individual video education with verbal and written material and guidebook.  Patient learns eating strategies to improve overall health, including an approach to cook more at home. Recommendations include thinking of animal protein as a side on your plate rather than center stage and focusing instead on lower calorie dense options like vegetables, fruits, whole grains, and plant-based proteins, such as beans. Making sauces in large quantities to freeze for later and leaving the skin on your vegetables are also recommended to maximize your experience.  Cooking - Healthy Salads and Dressing Clinical staff conducted group or individual video education with verbal and written material and guidebook.  Patient learns that vegetables, fruits, whole grains, and legumes are the foundations of the  Pritikin Eating Plan. Recommendations include how to incorporate each of these in flavorful and healthy salads, and how to create homemade salad dressings. Proper handling of ingredients is also covered. Cooking - Soups and [employer] - Soups and Desserts Clinical staff conducted group or individual video education with verbal and written material and guidebook.  Patient learns that Pritikin soups and desserts make for easy, nutritious, and delicious snacks and meal components that are low in sodium, fat, sugar, and calorie density, while high in vitamins, minerals, and filling fiber. Recommendations include simple and healthy ideas for soups and desserts.   Overview     The Pritikin Solution Program Overview Clinical staff conducted group or individual video education with verbal and written material and guidebook.  Patient learns that the results of the Pritikin Program have been documented in more than 100 articles published in peer-reviewed journals, and the benefits include reducing risk factors for (and, in some cases, even reversing) high cholesterol, high blood pressure, type 2 diabetes, obesity, and more! An overview of the three key pillars of the Pritikin Program will be covered: eating well, doing regular exercise, and having a healthy mind-set.  WORKSHOPS  Exercise: Exercise Basics: Building Your Action Plan Clinical staff led group instruction and group discussion with PowerPoint presentation and patient guidebook. To enhance the learning environment the use of posters, models and videos may be added. At the conclusion of this workshop, patients will comprehend the difference between physical activity and exercise, as well as the benefits of incorporating both, into their routine. Patients will understand the FITT (Frequency, Intensity, Time, and Type) principle and how to use it to build an exercise action plan. In addition, safety concerns and other considerations for  exercise and cardiac rehab will be addressed by the presenter. The purpose of this lesson is to promote a comprehensive and effective weekly exercise routine in order to improve  patients' overall level of fitness.   Managing Heart Disease: Your Path to a Healthier Heart Clinical staff led group instruction and group discussion with PowerPoint presentation and patient guidebook. To enhance the learning environment the use of posters, models and videos may be added.At the conclusion of this workshop, patients will understand the anatomy and physiology of the heart. Additionally, they will understand how Pritikin's three pillars impact the risk factors, the progression, and the management of heart disease.  The purpose of this lesson is to provide a high-level overview of the heart, heart disease, and how the Pritikin lifestyle positively impacts risk factors.  Exercise Biomechanics Clinical staff led group instruction and group discussion with PowerPoint presentation and patient guidebook. To enhance the learning environment the use of posters, models and videos may be added. Patients will learn how the structural parts of their bodies function and how these functions impact their daily activities, movement, and exercise. Patients will learn how to promote a neutral spine, learn how to manage pain, and identify ways to improve their physical movement in order to promote healthy living. The purpose of this lesson is to expose patients to common physical limitations that impact physical activity. Participants will learn practical ways to adapt and manage aches and pains, and to minimize their effect on regular exercise. Patients will learn how to maintain good posture while sitting, walking, and lifting.  Balance Training and Fall Prevention  Clinical staff led group instruction and group discussion with PowerPoint presentation and patient guidebook. To enhance the learning environment the use of  posters, models and videos may be added. At the conclusion of this workshop, patients will understand the importance of their sensorimotor skills (vision, proprioception, and the vestibular system) in maintaining their ability to balance as they age. Patients will apply a variety of balancing exercises that are appropriate for their current level of function. Patients will understand the common causes for poor balance, possible solutions to these problems, and ways to modify their physical environment in order to minimize their fall risk. The purpose of this lesson is to teach patients about the importance of maintaining balance as they age and ways to minimize their risk of falling.  WORKSHOPS   Nutrition:  Fueling a Ship broker led group instruction and group discussion with PowerPoint presentation and patient guidebook. To enhance the learning environment the use of posters, models and videos may be added. Patients will review the foundational principles of the Pritikin Eating Plan and understand what constitutes a serving size in each of the food groups. Patients will also learn Pritikin-friendly foods that are better choices when away from home and review make-ahead meal and snack options. Calorie density will be reviewed and applied to three nutrition priorities: weight maintenance, weight loss, and weight gain. The purpose of this lesson is to reinforce (in a group setting) the key concepts around what patients are recommended to eat and how to apply these guidelines when away from home by planning and selecting Pritikin-friendly options. Patients will understand how calorie density may be adjusted for different weight management goals.  Mindful Eating  Clinical staff led group instruction and group discussion with PowerPoint presentation and patient guidebook. To enhance the learning environment the use of posters, models and videos may be added. Patients will briefly review the  concepts of the Pritikin Eating Plan and the importance of low-calorie dense foods. The concept of mindful eating will be introduced as well as the importance of paying attention to internal  hunger signals. Triggers for non-hunger eating and techniques for dealing with triggers will be explored. The purpose of this lesson is to provide patients with the opportunity to review the basic principles of the Pritikin Eating Plan, discuss the value of eating mindfully and how to measure internal cues of hunger and fullness using the Hunger Scale. Patients will also discuss reasons for non-hunger eating and learn strategies to use for controlling emotional eating.  Targeting Your Nutrition Priorities Clinical staff led group instruction and group discussion with PowerPoint presentation and patient guidebook. To enhance the learning environment the use of posters, models and videos may be added. Patients will learn how to determine their genetic susceptibility to disease by reviewing their family history. Patients will gain insight into the importance of diet as part of an overall healthy lifestyle in mitigating the impact of genetics and other environmental insults. The purpose of this lesson is to provide patients with the opportunity to assess their personal nutrition priorities by looking at their family history, their own health history and current risk factors. Patients will also be able to discuss ways of prioritizing and modifying the Pritikin Eating Plan for their highest risk areas  Menu  Clinical staff led group instruction and group discussion with PowerPoint presentation and patient guidebook. To enhance the learning environment the use of posters, models and videos may be added. Using menus brought in from [employer], or printed from [employer], patients will apply the Pritikin dining out guidelines that were presented in the [employer] video. Patients will also be able to  practice these guidelines in a variety of provided scenarios. The purpose of this lesson is to provide patients with the opportunity to practice hands-on learning of the Pritikin Dining Out guidelines with actual menus and practice scenarios.  Label Reading Clinical staff led group instruction and group discussion with PowerPoint presentation and patient guidebook. To enhance the learning environment the use of posters, models and videos may be added. Patients will review and discuss the Pritikin label reading guidelines presented in Pritikin's Label Reading Educational series video. Using fool labels brought in from local grocery stores and markets, patients will apply the label reading guidelines and determine if the packaged food meet the Pritikin guidelines. The purpose of this lesson is to provide patients with the opportunity to review, discuss, and practice hands-on learning of the Pritikin Label Reading guidelines with actual packaged food labels. Cooking School  Pritikin's [employer] are designed to teach patients ways to prepare quick, simple, and affordable recipes at home. The importance of nutrition's role in chronic disease risk reduction is reflected in its emphasis in the overall Pritikin program. By learning how to prepare essential core Pritikin Eating Plan recipes, patients will increase control over what they eat; be able to customize the flavor of foods without the use of added salt, sugar, or fat; and improve the quality of the food they consume. By learning a set of core recipes which are easily assembled, quickly prepared, and affordable, patients are more likely to prepare more healthy foods at home. These workshops focus on convenient breakfasts, simple entres, side dishes, and desserts which can be prepared with minimal effort and are consistent with nutrition recommendations for cardiovascular risk reduction. Cooking [employer] are taught by a Interior and spatial designer (RD) who has been trained by the [employer]. The chef or RD has a clear understanding of the importance of minimizing - if not completely eliminating -  added fat, sugar, and sodium in recipes. Throughout the series of Cooking School Workshop sessions, patients will learn about healthy ingredients and efficient methods of cooking to build confidence in their capability to prepare    Cooking School weekly topics:  Adding Flavor- Sodium-Free  Fast and Healthy Breakfasts  Powerhouse Plant-Based Proteins  Satisfying Salads and Dressings  Simple Sides and Sauces  International Cuisine-Spotlight on the [employer] Zones  Delicious Desserts  Savory Soups  [employer] - Meals in a Astronomer Appetizers and Snacks  Comforting Weekend Breakfasts  One-Pot Wonders   Fast Evening Meals  Landscape architect Your Pritikin Plate  WORKSHOPS   Healthy Mindset (Psychosocial):  Focused Goals, Sustainable Changes Clinical staff led group instruction and group discussion with PowerPoint presentation and patient guidebook. To enhance the learning environment the use of posters, models and videos may be added. Patients will be able to apply effective goal setting strategies to establish at least one personal goal, and then take consistent, meaningful action toward that goal. They will learn to identify common barriers to achieving personal goals and develop strategies to overcome them. Patients will also gain an understanding of how our mind-set can impact our ability to achieve goals and the importance of cultivating a positive and growth-oriented mind-set. The purpose of this lesson is to provide patients with a deeper understanding of how to set and achieve personal goals, as well as the tools and strategies needed to overcome common obstacles which may arise along the way.  From Head to Heart: The Power of a Healthy Outlook  Clinical staff led group instruction and  group discussion with PowerPoint presentation and patient guidebook. To enhance the learning environment the use of posters, models and videos may be added. Patients will be able to recognize and describe the impact of emotions and mood on physical health. They will discover the importance of self-care and explore self-care practices which may work for them. Patients will also learn how to utilize the 4 C's to cultivate a healthier outlook and better manage stress and challenges. The purpose of this lesson is to demonstrate to patients how a healthy outlook is an essential part of maintaining good health, especially as they continue their cardiac rehab journey.  Healthy Sleep for a Healthy Heart Clinical staff led group instruction and group discussion with PowerPoint presentation and patient guidebook. To enhance the learning environment the use of posters, models and videos may be added. At the conclusion of this workshop, patients will be able to demonstrate knowledge of the importance of sleep to overall health, well-being, and quality of life. They will understand the symptoms of, and treatments for, common sleep disorders. Patients will also be able to identify daytime and nighttime behaviors which impact sleep, and they will be able to apply these tools to help manage sleep-related challenges. The purpose of this lesson is to provide patients with a general overview of sleep and outline the importance of quality sleep. Patients will learn about a few of the most common sleep disorders. Patients will also be introduced to the concept of "sleep hygiene," and discover ways to self-manage certain sleeping problems through simple daily behavior changes. Finally, the workshop will motivate patients by clarifying the links between quality sleep and their goals of heart-healthy living.   Recognizing and Reducing Stress Clinical staff led group instruction and group discussion with PowerPoint presentation and  patient guidebook. To enhance the learning environment the use of posters, models and videos may be  added. At the conclusion of this workshop, patients will be able to understand the types of stress reactions, differentiate between acute and chronic stress, and recognize the impact that chronic stress has on their health. They will also be able to apply different coping mechanisms, such as reframing negative self-talk. Patients will have the opportunity to practice a variety of stress management techniques, such as deep abdominal breathing, progressive muscle relaxation, and/or guided imagery.  The purpose of this lesson is to educate patients on the role of stress in their lives and to provide healthy techniques for coping with it.  Learning Barriers/Preferences:  Learning Barriers/Preferences - 02/08/23 1324       Learning Barriers/Preferences   Learning Barriers Sight   wears glasses   Learning Preferences Audio;Computer/Internet;Group Instruction;Individual Instruction;Pictoral;Skilled Demonstration;Verbal Instruction;Video             Education Topics:  Knowledge Questionnaire Score:  Knowledge Questionnaire Score - 02/08/23 1325       Knowledge Questionnaire Score   Pre Score 22/24             Core Components/Risk Factors/Patient Goals at Admission:  Personal Goals and Risk Factors at Admission - 02/08/23 1224       Core Components/Risk Factors/Patient Goals on Admission    Weight Management Yes;Weight Loss    Intervention Weight Management: Develop a combined nutrition and exercise program designed to reach desired caloric intake, while maintaining appropriate intake of nutrient and fiber, sodium and fats, and appropriate energy expenditure required for the weight goal.;Weight Management: Provide education and appropriate resources to help participant work on and attain dietary goals.;Weight Management/Obesity: Establish reasonable short term and long term weight goals.     Admit Weight 175 lb 14.8 oz (79.8 kg)    Expected Outcomes Weight Loss: Understanding of general recommendations for a balanced deficit meal plan, which promotes 1-2 lb weight loss per week and includes a negative energy balance of [phone] kcal/d;Short Term: Continue to assess and modify interventions until short term weight is achieved;Long Term: Adherence to nutrition and physical activity/exercise program aimed toward attainment of established weight goal;Understanding of distribution of calorie intake throughout the day with the consumption of 4-5 meals/snacks;Understanding recommendations for meals to include 15-35% energy as protein, 25-35% energy from fat, 35-60% energy from carbohydrates, less than 200mg  of dietary cholesterol, 20-35 gm of total fiber daily    Hypertension Yes    Intervention Provide education on lifestyle modifcations including regular physical activity/exercise, weight management, moderate sodium restriction and increased consumption of fresh fruit, vegetables, and low fat dairy, alcohol moderation, and smoking cessation.;Monitor prescription use compliance.    Expected Outcomes Short Term: Continued assessment and intervention until BP is < 140/35mm HG in hypertensive participants. < 130/55mm HG in hypertensive participants with diabetes, heart failure or chronic kidney disease.;Long Term: Maintenance of blood pressure at goal levels.    Lipids Yes    Intervention Provide education and support for participant on nutrition & aerobic/resistive exercise along with prescribed medications to achieve LDL 70mg , HDL >40mg .    Expected Outcomes Short Term: Participant states understanding of desired cholesterol values and is compliant with medications prescribed. Participant is following exercise prescription and nutrition guidelines.;Long Term: Cholesterol controlled with medications as prescribed, with individualized exercise RX and with personalized nutrition plan. Value goals: LDL  < 70mg , HDL > 40 mg.             Core Components/Risk Factors/Patient Goals Review:   Goals and Risk Factor Review  Row Name 02/14/23 1416 03/01/23 1359 03/29/23 1316 04/26/23 1241       Core Components/Risk Factors/Patient Goals Review   Personal Goals Review Weight Management/Obesity;Hypertension;Lipids Weight Management/Obesity;Hypertension;Lipids Weight Management/Obesity;Hypertension;Lipids Weight Management/Obesity;Hypertension;Lipids    Review Elaysha started cardiac rehab on 02/14/23 and did well with exercise. Vital signs and CBg's were stable Aleiah started cardiac rehab on 02/14/23 and doing  well with exercise. Vital signs have been stable Valmai continues to  do well with exercise. Vital signs remain stable Yasmine continues to  do well with exercise. Vital signs remain stable. Jacqulene has lost 1.7 kg since starting cardiac rehab.    Expected Outcomes Nathaly will continue to participate in cardiac rehab for exercise, nutrition and lifestyle modifications Amiya will continue to participate in cardiac rehab for exercise, nutrition and lifestyle modifications Endora will continue to participate in cardiac rehab for exercise, nutrition and lifestyle modifications Oluwadarasimi will continue to participate in cardiac rehab for exercise, nutrition and lifestyle modifications             Core Components/Risk Factors/Patient Goals at Discharge (Final Review):   Goals and Risk Factor Review - 04/26/23 1241       Core Components/Risk Factors/Patient Goals Review   Personal Goals Review Weight Management/Obesity;Hypertension;Lipids    Review Satya continues to  do well with exercise. Vital signs remain stable. Jonathon has lost 1.7 kg since starting cardiac rehab.    Expected Outcomes Tatiyanna will continue to participate in cardiac rehab for exercise, nutrition and lifestyle modifications             ITP Comments:  ITP Comments     Row Name 02/08/23 1204 02/14/23 1413 03/01/23  1356 03/29/23 1315 04/26/23 1239   ITP Comments Armanda Magic, MD: Medical Director.  Intorduction to the [employer] / Intensive Cardiac Rehab.  Initial orientation packet reviewed with the patient. 30 Day ITP Review. Amauri started cardiac rehab on 02/14/23 and did well with exercise 30 Day ITP Review. Aurion has good attendance and participation with exercise at cardiac rehab. 30 Day ITP Review. Chianna continues to have  good attendance and participation with exercise at cardiac rehab. 30 Day ITP Review. Mareena continues to have  good attendance and participation with exercise at cardiac rehab.            Comments: See ITP comments.Thayer Headings RN BSN

## 2023-04-27 ENCOUNTER — Encounter (HOSPITAL_COMMUNITY)
Admission: RE | Admit: 2023-04-27 | Discharge: 2023-04-27 | Disposition: A | Discharge status: Home / Self Care | Payer: Medicare Other | Source: Ambulatory Visit | Attending: Interventional Cardiology | Admitting: Interventional Cardiology

## 2023-04-27 DIAGNOSIS — Z955 Presence of coronary angioplasty implant and graft: Secondary | ICD-10-CM

## 2023-04-27 DIAGNOSIS — J449 Chronic obstructive pulmonary disease, unspecified: Secondary | ICD-10-CM | POA: Diagnosis not present

## 2023-04-29 ENCOUNTER — Encounter (HOSPITAL_COMMUNITY): Payer: Medicare Other

## 2023-05-02 ENCOUNTER — Other Ambulatory Visit (HOSPITAL_COMMUNITY): Payer: Self-pay | Admitting: Nurse Practitioner

## 2023-05-02 ENCOUNTER — Encounter (HOSPITAL_COMMUNITY)
Admission: RE | Admit: 2023-05-02 | Discharge: 2023-05-02 | Disposition: A | Discharge status: Home / Self Care | Payer: Medicare Other | Source: Ambulatory Visit | Attending: Interventional Cardiology | Admitting: Interventional Cardiology

## 2023-05-02 DIAGNOSIS — Z955 Presence of coronary angioplasty implant and graft: Secondary | ICD-10-CM

## 2023-05-02 DIAGNOSIS — I2510 Atherosclerotic heart disease of native coronary artery without angina pectoris: Secondary | ICD-10-CM | POA: Diagnosis not present

## 2023-05-02 DIAGNOSIS — J449 Chronic obstructive pulmonary disease, unspecified: Secondary | ICD-10-CM

## 2023-05-03 LAB — BASIC METABOLIC PANEL
BUN/Creatinine Ratio: 18 (ref 12–28)
BUN: 16 mg/dL (ref 8–27)
CO2: 22 mmol/L (ref 20–29)
Calcium: 10.1 mg/dL (ref 8.7–10.3)
Chloride: 103 mmol/L (ref 96–106)
Creatinine, Ser: 0.87 mg/dL (ref 0.57–1.00)
Glucose: 101 mg/dL — ABNORMAL HIGH (ref 70–99)
Potassium: 4.7 mmol/L (ref 3.5–5.2)
Sodium: 138 mmol/L (ref 134–144)
eGFR: 72 mL/min/{1.73_m2} (ref >59)

## 2023-05-04 ENCOUNTER — Encounter (HOSPITAL_COMMUNITY)
Admission: RE | Admit: 2023-05-04 | Discharge: 2023-05-04 | Disposition: A | Discharge status: Home / Self Care | Payer: Medicare Other | Source: Ambulatory Visit | Attending: Interventional Cardiology | Admitting: Interventional Cardiology

## 2023-05-04 DIAGNOSIS — J449 Chronic obstructive pulmonary disease, unspecified: Secondary | ICD-10-CM | POA: Diagnosis not present

## 2023-05-04 DIAGNOSIS — Z955 Presence of coronary angioplasty implant and graft: Secondary | ICD-10-CM

## 2023-05-09 ENCOUNTER — Encounter (HOSPITAL_COMMUNITY)
Admission: RE | Admit: 2023-05-09 | Discharge: 2023-05-09 | Disposition: A | Discharge status: Home / Self Care | Payer: Medicare Other | Source: Ambulatory Visit | Attending: Interventional Cardiology | Admitting: Interventional Cardiology

## 2023-05-09 DIAGNOSIS — Z48812 Encounter for surgical aftercare following surgery on the circulatory system: Secondary | ICD-10-CM | POA: Diagnosis not present

## 2023-05-09 DIAGNOSIS — Z955 Presence of coronary angioplasty implant and graft: Secondary | ICD-10-CM | POA: Diagnosis not present

## 2023-05-09 DIAGNOSIS — J449 Chronic obstructive pulmonary disease, unspecified: Secondary | ICD-10-CM | POA: Diagnosis not present

## 2023-05-11 ENCOUNTER — Encounter (HOSPITAL_COMMUNITY)
Admission: RE | Admit: 2023-05-11 | Discharge: 2023-05-11 | Disposition: A | Discharge status: Home / Self Care | Payer: Medicare Other | Source: Ambulatory Visit | Attending: Interventional Cardiology | Admitting: Interventional Cardiology

## 2023-05-11 DIAGNOSIS — Z48812 Encounter for surgical aftercare following surgery on the circulatory system: Secondary | ICD-10-CM | POA: Diagnosis not present

## 2023-05-11 DIAGNOSIS — Z955 Presence of coronary angioplasty implant and graft: Secondary | ICD-10-CM

## 2023-05-11 DIAGNOSIS — J449 Chronic obstructive pulmonary disease, unspecified: Secondary | ICD-10-CM | POA: Diagnosis not present

## 2023-05-13 ENCOUNTER — Encounter (HOSPITAL_COMMUNITY): Payer: Medicare Other

## 2023-05-16 ENCOUNTER — Encounter (HOSPITAL_COMMUNITY)
Admission: RE | Admit: 2023-05-16 | Discharge: 2023-05-16 | Disposition: A | Discharge status: Home / Self Care | Payer: Medicare Other | Source: Ambulatory Visit | Attending: Interventional Cardiology

## 2023-05-16 DIAGNOSIS — Z48812 Encounter for surgical aftercare following surgery on the circulatory system: Secondary | ICD-10-CM | POA: Diagnosis not present

## 2023-05-16 DIAGNOSIS — Z955 Presence of coronary angioplasty implant and graft: Secondary | ICD-10-CM | POA: Diagnosis not present

## 2023-05-16 DIAGNOSIS — J449 Chronic obstructive pulmonary disease, unspecified: Secondary | ICD-10-CM | POA: Diagnosis not present

## 2023-05-18 ENCOUNTER — Encounter (HOSPITAL_COMMUNITY)
Admission: RE | Admit: 2023-05-18 | Discharge: 2023-05-18 | Disposition: A | Discharge status: Home / Self Care | Payer: Medicare Other | Source: Ambulatory Visit | Attending: Interventional Cardiology | Admitting: Interventional Cardiology

## 2023-05-18 VITALS — Ht 66.0 in | Wt 171.5 lb

## 2023-05-18 DIAGNOSIS — Z48812 Encounter for surgical aftercare following surgery on the circulatory system: Secondary | ICD-10-CM | POA: Diagnosis not present

## 2023-05-18 DIAGNOSIS — Z955 Presence of coronary angioplasty implant and graft: Secondary | ICD-10-CM | POA: Diagnosis not present

## 2023-05-18 DIAGNOSIS — J449 Chronic obstructive pulmonary disease, unspecified: Secondary | ICD-10-CM | POA: Diagnosis not present

## 2023-05-18 NOTE — Progress Notes (Signed)
Cardiac Individual Treatment Plan  Patient Details  Name: [name] [name] MRN: [id number] Date of Birth: [birth year] Referring Provider:   Flowsheet Row INTENSIVE CARDIAC REHAB ORIENT from 02/08/2023 in [facility]  Referring Provider Lance Muss, MD       Initial Encounter Date:  Flowsheet Row INTENSIVE CARDIAC REHAB ORIENT from 02/08/2023 in [facility]  Date 02/08/23       Visit Diagnosis: 01/26/23 Status post coronary artery stent placement  Patient's Home Medications on Admission:  Current Outpatient Medications:    aspirin EC 81 MG tablet, Take 1 tablet (81 mg total) by mouth daily. Swallow whole., Disp: 30 tablet, Rfl: 12   clopidogrel (PLAVIX) 75 MG tablet, Take 1 tablet (75 mg total) by mouth daily., Disp: 90 tablet, Rfl: 3   loratadine (CLARITIN) 10 MG tablet, Take 10 mg by mouth daily as needed for rhinitis or allergies., Disp: , Rfl:    nitroGLYCERIN (NITROSTAT) 0.4 MG SL tablet, Place 1 tablet (0.4 mg total) under the tongue every 5 (five) minutes as needed for chest pain., Disp: 25 tablet, Rfl: 6   rosuvastatin (CRESTOR) 20 MG tablet, Take 1 tablet (20 mg total) by mouth daily., Disp: 90 tablet, Rfl: 3   sacubitril-valsartan (ENTRESTO) 97-103 MG, Take 1 tablet by mouth 2 (two) times daily., Disp: 60 tablet, Rfl: 5   spironolactone (ALDACTONE) 25 MG tablet, Take 0.5 tablets (12.5 mg total) by mouth daily., Disp: 45 tablet, Rfl: 3   Suvorexant (BELSOMRA) 10 MG TABS, Take 1 tablet (10 mg total) by mouth at bedtime as needed., Disp: 30 tablet, Rfl: 2   Tiotropium Bromide-Olodaterol (STIOLTO RESPIMAT) 2.5-2.5 MCG/ACT AERS, Inhale 2 puffs into the lungs daily., Disp: , Rfl:    VENTOLIN HFA 108 (90 Base) MCG/ACT inhaler, INHALE 1 PUFF BY MOUTH THREE TIMES DAILY AS NEEDED FOR WHEEZING FOR SHORTNESS OF BREATH, Disp: 18 g, Rfl: 0   Vitamin D, Ergocalciferol,  (DRISDOL) 50000 units CAPS capsule, Take 50,000 Units by mouth every 7 (seven) days., Disp: , Rfl:   Past Medical History: Past Medical History:  Diagnosis Date   Arthritis of hand 12/04/2021   CAD (coronary artery disease)    Central scotoma, 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 she was given albuterol inhaler & had steroid with surg.     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, transient, takes Verapimil for migraine & BP control    Mild obstructive sleep apnea 07/28/2021   Pain of right thumb 06/24/2017   Plantar fasciitis of left foot 03/12/2022   PONV (postoperative nausea and vomiting)    Primary cancer of right lower lobe of lung 04/17/2014   Shortness of breath 09/17/2019   Sprain of lateral ligament of ankle joint 09/11/2021   Subjective memory complaints 07/16/2022   Tenosynovitis of both hands 12/04/2021   Thyroid  nodule    benign by biopsy   Tightness of left gastrocnemius muscle 03/12/2022   Transient visual loss 01/25/2012   Varicose veins    left leg   Vitamin D deficiency    Wears glasses     Tobacco Use: Social History   Tobacco Use  Smoking Status Former   Current packs/day: 0.00   Average packs/day: 0.5 packs/day for 40.0 years (20.0 ttl pk-yrs)   Types: Cigarettes   Start date: 03/17/1973   Quit date: 03/17/2013   Years since quitting: 10.1  Smokeless Tobacco Never    Labs: Review Flowsheet  More data exists       Latest Ref Rng & Units 10/11/2017 04/30/2019 04/21/2020 11/14/2020 01/26/2023  Labs for ITP Cardiac and Pulmonary Rehab  Cholestrol 100 - 199 mg/dL 409  811  914  782  -  LDL (calc) 0 - 99 mg/dL 55  53  44  48  -  HDL-C >39 mg/dL 73  82  87  68  -  Trlycerides 0 - 149 mg/dL 54  64  60  46  -  PH, Arterial 7.35 - 7.45 - - - - 7.351   PCO2 arterial 32 - 48 mmHg - - - - 41.0   Bicarbonate 20.0 - 28.0 mmol/L - - - - 23.8  23.5  22.7   TCO2 22 - 32 mmol/L - - - - 25  25  24    Acid-base deficit 0.0 - 2.0 mmol/L - - - - 2.0  2.0  3.0   O2 Saturation % - - - - 75  72  91     Details       Multiple values from one day are sorted in reverse-chronological order         Capillary Blood Glucose: Lab Results  Component Value Date   GLUCAP 119 (H) 04/23/2014     Exercise Target Goals: Exercise Program Goal: Individual exercise prescription set using results from initial 6 min walk test and THRR while considering  patient's activity barriers and safety.   Exercise Prescription Goal: Initial exercise prescription builds to 30-45 minutes a day of aerobic activity, 2-3 days per week.  Home exercise guidelines will be given to patient during program as part of exercise prescription that the participant will acknowledge.  Activity Barriers & Risk Stratification:  Activity Barriers & Cardiac Risk Stratification - 02/08/23 1320       Activity Barriers & Cardiac Risk Stratification   Activity Barriers Balance Concerns;Arthritis;Joint Problems;Back Problems;Deconditioning;Shortness of Breath;Neck/Spine Problems    Cardiac Risk Stratification High             6 Minute Walk:  6 Minute Walk     Row Name 02/08/23 1143 05/18/23 1422       6 Minute Walk   Phase Initial Discharge    Distance 1374 feet 2020 feet    Distance % Change -- 47.02 %    Distance Feet Change -- 646 ft    Walk Time 6 minutes 6 minutes    # of Rest Breaks 0 0    MPH 2.6 3.82    METS 3.4 4.4    RPE 11 16     Perceived Dyspnea  1 3    VO2 Peak 11.8 15.4    Symptoms Yes (comment) Yes (comment)  4/10 discomfort in both ankles    Comments SOB, RPD = 1 --    Resting HR 79 bpm 86 bpm    Resting BP 126/72  112/78    Resting Oxygen Saturation  97 % --    Exercise Oxygen Saturation  during 6 min walk 96 % --    Max Ex. HR 119 bpm 98 bpm    Max Ex. BP 150/80 164/68    2 Minute Post BP 132/70 132/70      Interval Oxygen   Interval Oxygen? Yes --    Baseline Oxygen Saturation % 97 % --    1 Minute Oxygen Saturation % 94 % --    1 Minute Liters of Oxygen 0 L --    2 Minute Oxygen Saturation % 97 % --    2 Minute Liters of Oxygen 0 L --    3 Minute Oxygen Saturation % 95 % --    3 Minute Liters of Oxygen 0 L --    4 Minute Oxygen Saturation % 96 % --    4 Minute Liters of Oxygen 0 L --    5 Minute Oxygen Saturation % 95 % --    5 Minute Liters of Oxygen 0 L --    6 Minute Oxygen Saturation % 96 % --    6 Minute Liters of Oxygen 0 L --    2 Minute Post Oxygen Saturation % 99 % --             Oxygen Initial Assessment:   Oxygen Re-Evaluation:   Oxygen Discharge (Final Oxygen Re-Evaluation):   Initial Exercise Prescription:  Initial Exercise Prescription - 02/08/23 1300       Date of Initial Exercise RX and Referring Provider   Date 02/08/23    Referring Provider Lance Muss, MD    Expected Discharge Date 04/23/21      NuStep   Level 2    SPM 80    Minutes 15    METs 3.4      Recumbant Elliptical   Level 2    RPM 50    Watts 25    Minutes 15    METs 3.4      Prescription Details   Frequency (times per week) 3    Duration Progress to 30 minutes of continuous aerobic without signs/symptoms of physical distress      Intensity   THRR 40-80% of Max Heartrate 60-121    Ratings of Perceived Exertion 11-13    Perceived Dyspnea 0-4      Progression   Progression Continue progressive overload as per policy without signs/symptoms or physical distress.       Resistance Training   Training Prescription Yes    Weight 3 lbs    Reps 10-15             Perform Capillary Blood Glucose checks as needed.  Exercise Prescription Changes:   Exercise Prescription Changes     Row Name 02/14/23 1400 02/25/23 1400 03/23/23 1500 04/13/23 1500 04/18/23 1500     Response to Exercise   Blood Pressure (Admit) 130/60 106/66 112/62 118/68 134/70   Blood Pressure (Exercise) 156/84 132/70 -- -- --   Blood Pressure (Exit) 112/62 118/70 118/64 110/70 118/80   Heart Rate (Admit) 97 bpm 92 bpm 100 bpm 76 bpm 96 bpm   Heart Rate (Exercise) 115 bpm 117 bpm 124 bpm 120 bpm 124 bpm   Heart Rate (Exit) 100 bpm 97 bpm 99 bpm 85 bpm 99 bpm   Oxygen Saturation (Admit) 95 % -- -- -- --   Oxygen Saturation (Exercise) 98 % -- -- -- --   Oxygen Saturation (  Exit) 96 % -- -- -- --   Rating of Perceived Exertion (Exercise) 12 11.5 11.5 11 11.5   Perceived Dyspnea (Exercise) 1 -- -- -- --   Symptoms SOB, RPD = 1 none None None None   Comments Pt's first day in the CRP2 program Reviewed METs Reviewed METs and goals Reviewed METs Reviewed HERx   Duration Continue with 30 min of aerobic exercise without signs/symptoms of physical distress. Continue with 30 min of aerobic exercise without signs/symptoms of physical distress. Continue with 30 min of aerobic exercise without signs/symptoms of physical distress. Continue with 30 min of aerobic exercise without signs/symptoms of physical distress. Continue with 30 min of aerobic exercise without signs/symptoms of physical distress.   Intensity THRR unchanged THRR unchanged THRR unchanged THRR unchanged --     Progression   Progression Continue to progress workloads to maintain intensity without signs/symptoms of physical distress. -- Continue to progress workloads to maintain intensity without signs/symptoms of physical distress. Continue to progress workloads to maintain intensity without signs/symptoms of physical distress. Continue  to progress workloads to maintain intensity without signs/symptoms of physical distress.   Average METs 2.3 -- 3.4 2.7 --     Resistance Training   Training Prescription Yes Yes No No Yes   Weight 3 lbs 3 No weights on Wednesdays -- 3 lbs   Reps 10-15 10-15 -- -- 10-15   Time 10 Minutes 10 Minutes -- -- 10 Minutes     Interval Training   Interval Training No -- No No No     NuStep   Level 2 2 3 4 4    SPM 90 73 90 93 106   Minutes 15 15 15 15 15    METs 2.5 2.4 2.4 2.4 3     Recumbant Elliptical   Level 2 2 3 4 4    RPM 51 58 59 32 --   Watts 64 75 93 60 37   Minutes 15 15 15 15 15    METs 2.1 3.5 4.4 3 1.8     Home Exercise Plan   Plans to continue exercise at -- -- -- -- Home (comment)   Frequency -- -- -- -- Add 2 additional days to program exercise sessions.   Initial Home Exercises Provided -- -- -- -- 04/18/23    Row Name 05/02/23 1500             Response to Exercise   Blood Pressure (Admit) 124/76       Blood Pressure (Exit) 110/62       Heart Rate (Admit) 79 bpm       Heart Rate (Exercise) 117 bpm       Heart Rate (Exit) 88 bpm       Rating of Perceived Exertion (Exercise) 12       Symptoms None       Comments Reviewed METs and goals       Duration Continue with 30 min of aerobic exercise without signs/symptoms of physical distress.       Intensity THRR unchanged         Progression   Progression Continue to progress workloads to maintain intensity without signs/symptoms of physical distress.       Average METs 3.15         Resistance Training   Training Prescription Yes       Weight 3 lbs       Reps 10-15       Time 10 Minutes  Interval Training   Interval Training No         NuStep   Level 4       SPM 91       Minutes 15       METs 2.8         Recumbant Elliptical   Level 4       RPM 64       Watts 84       Minutes 15       METs 3.3         Home Exercise Plan   Plans to continue exercise at Home (comment)       Frequency Add 2  additional days to program exercise sessions.       Initial Home Exercises Provided 04/18/23                Exercise Comments:   Exercise Comments     Row Name 02/14/23 1426 02/25/23 1443 03/23/23 1500 04/13/23 1500 04/18/23 1500   Exercise Comments Pt's first day in the CRP2 program. Pt excercised with no complaints other than some mild SOB. Reviewed METs with pt today Reviewed METs and goals with Marjean Donna today. No changes in modalites or workloads today. Reviewed METs. Pt's MET levels are inconsistant due to how her knees feel that day. Pt is trying to walk at home but again its depends on how her knees fee. Will review HERx soon. Reviewed home exercise Rx with patient today. Pt does some walking at home but not consistently due to her knees and also the fatigue from her medications. Pt is on max does of Entresto and she voices that some days she is very tired. I encouraged patient to be as active as she can be. More active on her good days, and rest if she is feeling very fatigued. Pt voices she has talked to her Dr and will start on a B or B12 vitamin in hopes it improves her energy level.  Pt wants to join the PREP program when she finishes the CRP2 program. Pt verbalized understanding of the home exricse Rx and was provided a copy.    Row Name 05/03/23 1218           Exercise Comments Reviewed METs and goals. Continues to progress. Continue to encourage patient to be as active as she can be on the days she is feeling well.                Exercise Goals and Review:   Exercise Goals     Row Name 02/08/23 1208             Exercise Goals   Increase Physical Activity Yes       Intervention Provide advice, education, support and counseling about physical activity/exercise needs.;Develop an individualized exercise prescription for aerobic and resistive training based on initial evaluation findings, risk stratification, comorbidities and participant's personal goals.        Expected Outcomes Short Term: Attend rehab on a regular basis to increase amount of physical activity.;Long Term: Add in home exercise to make exercise part of routine and to increase amount of physical activity.;Long Term: Exercising regularly at least 3-5 days a week.       Increase Strength and Stamina Yes       Intervention Provide advice, education, support and counseling about physical activity/exercise needs.;Develop an individualized exercise prescription for aerobic and resistive training based on initial evaluation findings, risk stratification, comorbidities and participant's  personal goals.       Expected Outcomes Short Term: Increase workloads from initial exercise prescription for resistance, speed, and METs.;Short Term: Perform resistance training exercises routinely during rehab and add in resistance training at home;Long Term: Improve cardiorespiratory fitness, muscular endurance and strength as measured by increased METs and functional capacity ( )       Able to understand and use rate of perceived exertion (RPE) scale Yes       Intervention Provide education and explanation on how to use RPE scale       Expected Outcomes Short Term: Able to use RPE daily in rehab to express subjective intensity level;Long Term:  Able to use RPE to guide intensity level when exercising independently       Able to understand and use Dyspnea scale Yes       Intervention Provide education and explanation on how to use Dyspnea scale       Expected Outcomes Short Term: Able to use Dyspnea scale daily in rehab to express subjective sense of shortness of breath during exertion;Long Term: Able to use Dyspnea scale to guide intensity level when exercising independently       Knowledge and understanding of Target Heart Rate Range (THRR) Yes       Intervention Provide education and explanation of THRR including how the numbers were predicted and where they are located for reference       Expected Outcomes Short  Term: Able to state/look up THRR;Short Term: Able to use daily as guideline for intensity in rehab;Long Term: Able to use THRR to govern intensity when exercising independently       Understanding of Exercise Prescription Yes       Intervention Provide education, explanation, and written materials on patient's individual exercise prescription       Expected Outcomes Short Term: Able to explain program exercise prescription;Long Term: Able to explain home exercise prescription to exercise independently                Exercise Goals Re-Evaluation :  Exercise Goals Re-Evaluation     Row Name 02/14/23 1425 02/25/23 1442 03/23/23 1500 05/02/23 1500       Exercise Goal Re-Evaluation   Exercise Goals Review Increase Physical Activity;Understanding of Exercise Prescription;Increase Strength and Stamina;Knowledge and understanding of Target Heart Rate Range (THRR);Able to understand and use Dyspnea scale;Able to understand and use rate of perceived exertion (RPE) scale -- Increase Physical Activity;Understanding of Exercise Prescription;Increase Strength and Stamina;Knowledge and understanding of Target Heart Rate Range (THRR);Able to understand and use Dyspnea scale;Able to understand and use rate of perceived exertion (RPE) scale Increase Physical Activity;Understanding of Exercise Prescription;Increase Strength and Stamina;Knowledge and understanding of Target Heart Rate Range (THRR);Able to understand and use Dyspnea scale;Able to understand and use rate of perceived exertion (RPE) scale    Comments Pt's first day in the CRP2 program, Pt understnads the RPE scale, THRR and exercise Rx Reviewed METs with pt today Reviewed METs and goals with patient today. Peak METs = 4.4. Pt is making good progress. Pt is making progress on her goals of weight loss and eating healthier. Pt would like to do more exerise and we will dicusee home exercise Rx soon. Reviewed METs and goals with patient today. Peak METs =  4.4. Pt is making good progress. Pt is making progress on her goals of weight loss and eating healthier, pt lost 5 lbs since start of program. Pt is also trying to walk at home to as  well; this however depends on fatigue and joint pain.    Expected Outcomes Will continue to monitor patient and progress exercise workloads as tolerated. Will continue to watch pt closely and progress as tolerated. Will continue to monitor patient and progress exercise workloads as tolerated. Will continue to monitor patient and progress exercise workloads as tolerated.             Discharge Exercise Prescription (Final Exercise Prescription Changes):  Exercise Prescription Changes - 05/02/23 1500       Response to Exercise   Blood Pressure (Admit) 124/76    Blood Pressure (Exit) 110/62    Heart Rate (Admit) 79 bpm    Heart Rate (Exercise) 117 bpm    Heart Rate (Exit) 88 bpm    Rating of Perceived Exertion (Exercise) 12    Symptoms None    Comments Reviewed METs and goals    Duration Continue with 30 min of aerobic exercise without signs/symptoms of physical distress.    Intensity THRR unchanged      Progression   Progression Continue to progress workloads to maintain intensity without signs/symptoms of physical distress.    Average METs 3.15      Resistance Training   Training Prescription Yes    Weight 3 lbs    Reps 10-15    Time 10 Minutes      Interval Training   Interval Training No      NuStep   Level 4    SPM 91    Minutes 15    METs 2.8      Recumbant Elliptical   Level 4    RPM 64    Watts 84    Minutes 15    METs 3.3      Home Exercise Plan   Plans to continue exercise at Home (comment)    Frequency Add 2 additional days to program exercise sessions.    Initial Home Exercises Provided 04/18/23             Nutrition:  Target Goals: Understanding of nutrition guidelines, daily intake of sodium 1500mg , cholesterol 200mg , calories 30% from fat and 7% or less from  saturated fats, daily to have 5 or more servings of fruits and vegetables.  Biometrics:  Pre Biometrics - 02/08/23 1209       Pre Biometrics   % Body Fat 41.9 %             Post Biometrics - 05/18/23 1423        Post  Biometrics   Height 5\' 6"  (1.676 m)    Weight 77.8 kg    Waist Circumference 38 inches    Hip Circumference 45 inches    Waist to Hip Ratio 0.84 %    BMI (Calculated) 27.7    Triceps Skinfold 31 mm    % Body Fat 40.4 %    Grip Strength 30 kg    Flexibility 15.5 in    Single Leg Stand 2.53 seconds             Nutrition Therapy Plan and Nutrition Goals:  Nutrition Therapy & Goals - 04/15/23 1330       Nutrition Therapy   Diet Heart Healthy Diet    Drug/Food Interactions Statins/Certain Fruits      Personal Nutrition Goals   Nutrition Goal Patient to identify strategies for reducing cardiovascular risk by attending the [facility] education and nutrition series weekly.   goal in action.   Personal Goal #2 Patient to  improve diet quality by using the plate method as a guide for meal planning to include lean protein/plant protein, fruits, vegetables, whole grains, nonfat dairy as part of a well-balanced diet.   goal in action.   Personal Goal #3 Patient to reduce sodium to 1500mg  per day.   goal in progress.   Comments Goals in progress. Laasia reports motivation to improve eating habits. She continues to attend to the [facility] education and nutrition series regularly. She has medical history of CAD, CHF, HTN, COPD. She is down 4.4# since starting with our program. Sheh as started making many dietary changes including increased dietary fiber and reduced saturated fat. She continues to work on mindful eating habits and reducing snacking on refined carbohydrates. LDL remains well controlled <70 (57). Patient will benefit from participation in intensive cardiac rehab for nutrition, exercise, and lifestyle modification.      Intervention Plan   Intervention  Prescribe, educate and counsel regarding individualized specific dietary modifications aiming towards targeted core components such as weight, hypertension, lipid management, diabetes, heart failure and other comorbidities.;Nutrition handout(s) given to patient.    Expected Outcomes Short Term Goal: Understand basic principles of dietary content, such as calories, fat, sodium, cholesterol and nutrients.;Long Term Goal: Adherence to prescribed nutrition plan.             Nutrition Assessments:  Nutrition Assessments - 02/15/23 1151       Rate Your Plate Scores   Pre Score 45            MEDIFICTS Score Key: >=70 Need to make dietary changes  40-70 Heart Healthy Diet <= 40 Therapeutic Level Cholesterol Diet   Flowsheet Row INTENSIVE CARDIAC REHAB from 02/14/2023 in [facility]  Picture Your Plate Total Score on Admission 45      Picture Your Plate Scores: <52 Unhealthy dietary pattern with much room for improvement. 41-50 Dietary pattern unlikely to meet recommendations for good health and room for improvement. 51-60 More healthful dietary pattern, with some room for improvement.  >60 Healthy dietary pattern, although there may be some specific behaviors that could be improved.    Nutrition Goals Re-Evaluation:  Nutrition Goals Re-Evaluation     Row Name 02/14/23 1320 03/17/23 0906 04/15/23 1330         Goals   Current Weight 175 lb 14.8 oz (79.8 kg) 172 lb 9.9 oz (78.3 kg) 171 lb 8.3 oz (77.8 kg)     Comment HDL 79 no new labs; most recent labs HDL 79 no new labs; most recent labs HDL 79, LDL 57     Expected Outcome Nazaria reports motvation to improve eating habits. She has medical history of CAD, CHF, HTN, COPD. Patient will benefit from participation in intensive cardiac rehab for nutrition, exercise, and lifestyle modification. Goals in progress. Denequa reports motvation to improve eating habits. She continues  to attend to the [facility] education and nutrition series regularly. She has medical history of CAD, CHF, HTN, COPD. She is down 3.3# since starting with our program. She continues follow-up with cardiology for CAD, CHF. Patient will benefit from participation in intensive cardiac rehab for nutrition, exercise, and lifestyle modification. Goals in progress. Tundra reports motivation to improve eating habits. She continues to attend to the [facility] education and nutrition series regularly. She has medical history of CAD, CHF, HTN, COPD. She is down 4.4# since starting with our program. Sheh as started making many dietary changes including increased dietary  fiber and reduced saturated fat. She continues to work on mindful eating habits and reducing snacking on refined carbohydrates. LDL remains well controlled <70 (57). Patient will benefit from participation in intensive cardiac rehab for nutrition, exercise, and lifestyle modification.              Nutrition Goals Re-Evaluation:  Nutrition Goals Re-Evaluation     Row Name 02/14/23 1320 03/17/23 0906 04/15/23 1330         Goals   Current Weight 175 lb 14.8 oz (79.8 kg) 172 lb 9.9 oz (78.3 kg) 171 lb 8.3 oz (77.8 kg)     Comment HDL 79 no new labs; most recent labs HDL 79 no new labs; most recent labs HDL 79, LDL 57     Expected Outcome Simrah reports motvation to improve eating habits. She has medical history of CAD, CHF, HTN, COPD. Patient will benefit from participation in intensive cardiac rehab for nutrition, exercise, and lifestyle modification. Goals in progress. Yamaris reports motvation to improve eating habits. She continues to attend to the Prit[facility]in education and nutrition series regularly. She has medical history of CAD, CHF, HTN, COPD. She is down 3.3# since starting with our program. She continues follow-up with cardiology for CAD, CHF. Patient will benefit from participation in intensive cardiac rehab for nutrition, exercise, and  lifestyle modification. Goals in progress. Isola reports motivation to improve eating habits. She continues to attend to the [facility] education and nutrition series regularly. She has medical history of CAD, CHF, HTN, COPD. She is down 4.4# since starting with our program. Sheh as started making many dietary changes including increased dietary fiber and reduced saturated fat. She continues to work on mindful eating habits and reducing snacking on refined carbohydrates. LDL remains well controlled <70 (57). Patient will benefit from participation in intensive cardiac rehab for nutrition, exercise, and lifestyle modification.              Nutrition Goals Discharge (Final Nutrition Goals Re-Evaluation):  Nutrition Goals Re-Evaluation - 04/15/23 1330       Goals   Current Weight 171 lb 8.3 oz (77.8 kg)    Comment no new labs; most recent labs HDL 79, LDL 57    Expected Outcome Goals in progress. Merikay reports motivation to improve eating habits. She continues to attend to the [facility] education and nutrition series regularly. She has medical history of CAD, CHF, HTN, COPD. She is down 4.4# since starting with our program. Sheh as started making many dietary changes including increased dietary fiber and reduced saturated fat. She continues to work on mindful eating habits and reducing snacking on refined carbohydrates. LDL remains well controlled <70 (57). Patient will benefit from participation in intensive cardiac rehab for nutrition, exercise, and lifestyle modification.             Psychosocial: Target Goals: Acknowledge presence or absence of significant depression and/or stress, maximize coping skills, provide positive support system. Participant is able to verbalize types and ability to use techniques and skills needed for reducing stress and depression.  Initial Review & Psychosocial Screening:  Initial Psych Review & Screening - 02/08/23 1210       Initial Review   Current issues  with Current Sleep Concerns   Pt has mild sleep apena but has not been Rx a CPAP     Family Dynamics   Good Support System? Yes   Has daughter for support, and neice     Barriers   Psychosocial barriers to participate in  program There are no identifiable barriers or psychosocial needs.      Screening Interventions   Interventions Encouraged to exercise             Quality of Life Scores:  Quality of Life - 05/16/23 1500       Quality of Life Scores   Health/Function Pre 28.62 %    Health/Function Post 26.5 %    Health/Function % Change -7.41 %    Socioeconomic Pre 30 %    Socioeconomic Post 29 %    Socioeconomic % Change  -3.33 %    Psych/Spiritual Pre 30 %    Psych/Spiritual Post 25 %    Psych/Spiritual % Change -16.67 %    Family Pre 30 %    Family Post 30 %    Family % Change 0 %    GLOBAL Pre 29.38 %    GLOBAL Post 27.21 %    GLOBAL % Change -7.39 %            Scores of 19 and below usually indicate a poorer quality of life in these areas.  A difference of  2-3 points is a clinically meaningful difference.  A difference of 2-3 points in the total score of the Quality of Life Index has been associated with significant improvement in overall quality of life, self-image, physical symptoms, and general health in studies assessing change in quality of life.  PHQ-9: Review Flowsheet  More data exists      02/08/2023 04/23/2021 02/16/2021 07/10/2018 06/05/2014  Depression screen PHQ 2/9  Decreased Interest 0 0 0 0 0  Down, Depressed, Hopeless 0 0 0 0 0  PHQ - 2 Score 0 0 0 0 0  Altered sleeping 1 1 3  0 -  Tired, decreased energy 1 1 0 1 -  Change in appetite 0 0 0 0 -  Feeling bad or failure about yourself  0 0 0 0 -  Trouble concentrating 0 0 0 0 -  Moving slowly or fidgety/restless 0 0 0 0 -  Suicidal thoughts 0 0 0 0 -  PHQ-9 Score 2 2 - 1 -  Difficult doing work/chores Not difficult at all Not difficult at all Not difficult at all Not difficult at all -     Details           Interpretation of Total Score  Total Score Depression Severity:  1-4 = Minimal depression, 5-9 = Mild depression, 10-14 = Moderate depression, 15-19 = Moderately severe depression, 20-27 = Severe depression   Psychosocial Evaluation and Intervention:   Psychosocial Re-Evaluation:  Psychosocial Re-Evaluation     Row Name 02/14/23 1415 03/01/23 1356 03/29/23 1316 04/26/23 1239 05/18/23 1641     Psychosocial Re-Evaluation   Current issues with None Identified None Identified None Identified None Identified None Identified   Comments Aniya did not voiced any concerns or stressors on her first day of exercise Anderson has not voiced any concerns or stressors during exercise at cardiac rehab. Hollynd has not voiced any concerns or stressors during exercise at cardiac rehab. Timekia continues not to voice concerns or stressors during exercise at cardiac rehab. Madelynn will complete cardiac rehab on 05/27/23.   Interventions Stress management education;Encouraged to attend Cardiac Rehabilitation for the exercise;Relaxation education Stress management education;Encouraged to attend Cardiac Rehabilitation for the exercise;Relaxation education Stress management education;Encouraged to attend Cardiac Rehabilitation for the exercise;Relaxation education Stress management education;Encouraged to attend Cardiac Rehabilitation for the exercise;Relaxation education Stress management education;Encouraged to attend Cardiac  Rehabilitation for the exercise;Relaxation education   Continue Psychosocial Services  No Follow up required No Follow up required No Follow up required No Follow up required No Follow up required            Psychosocial Discharge (Final Psychosocial Re-Evaluation):  Psychosocial Re-Evaluation - 05/18/23 1641       Psychosocial Re-Evaluation   Current issues with None Identified    Comments Clydine will complete cardiac rehab on 05/27/23.    Interventions Stress  management education;Encouraged to attend Cardiac Rehabilitation for the exercise;Relaxation education    Continue Psychosocial Services  No Follow up required             Vocational Rehabilitation: Provide vocational rehab assistance to qualifying candidates.   Vocational Rehab Evaluation & Intervention:  Vocational Rehab - 02/08/23 1224       Initial Vocational Rehab Evaluation & Intervention   Assessment shows need for Vocational Rehabilitation No   Pt is reitred.            Education: Education Goals: Education classes will be provided on a weekly basis, covering required topics. Participant will state understanding/return demonstration of topics presented.    Education     Row Name 02/14/23 1300     Education   Cardiac Education Topics Pritikin   Glass blower/designer Nutrition   Nutrition Workshop Label Reading   Instruction Review Code 1- Verbalizes Understanding   Class Start Time 1145   Class Stop Time 1234   Class Time Calculation (min) 49 min    Row Name 02/16/23 1400     Education   Cardiac Education Topics Pritikin   Orthoptist   Educator Dietitian   Weekly Topic [employer] Desserts   Instruction Review Code 1- Verbalizes Understanding   Class Start Time 1145   Class Stop Time 1230   Class Time Calculation (min) 45 min    Row Name 02/18/23 1300     Education   Cardiac Education Topics Pritikin   Licensed conveyancer Nutrition   Nutrition Other  Label Reading   Instruction Review Code 1- Verbalizes Understanding   Class Start Time 1150   Class Stop Time 1235   Class Time Calculation (min) 45 min    Row Name 02/21/23 1500     Education   Cardiac Education Topics Pritikin   Select Workshops     Workshops   Educator Exercise Physiologist   Select Psychosocial   Psychosocial Workshop Recognizing and Reducing Stress    Instruction Review Code 1- Verbalizes Understanding   Class Start Time 1148   Class Stop Time 1237   Class Time Calculation (min) 49 min    Row Name 02/23/23 1400     Education   Cardiac Education Topics Pritikin   Customer service manager   Weekly Topic Tasty Appetizers and Snacks   Instruction Review Code 1- Verbalizes Understanding   Class Start Time 1145   Class Stop Time 1225   Class Time Calculation (min) 40 min    Row Name 02/25/23 1200     Education   Cardiac Education Topics Pritikin   Nurse, children's Exercise Physiologist   Select Nutrition   Nutrition Calorie Density  Instruction Review Code 1- Verbalizes Understanding   Class Start Time 1155   Class Stop Time 1235   Class Time Calculation (min) 40 min    Row Name 02/28/23 1300     Education   Cardiac Education Topics Pritikin   Geographical information systems officer Exercise   Exercise Workshop Exercise Basics: Building Your Action Plan   Instruction Review Code 1- Verbalizes Understanding   Class Start Time 1155   Class Stop Time 1237   Class Time Calculation (min) 42 min    Row Name 03/02/23 1500     Education   Cardiac Education Topics Pritikin   Customer service manager   Weekly Topic Efficiency Cooking - Meals in a Snap   Instruction Review Code 1- Verbalizes Understanding   Class Start Time 1145   Class Stop Time 1224   Class Time Calculation (min) 39 min    Row Name 03/07/23 1300     Education   Cardiac Education Topics Pritikin   Glass blower/designer Nutrition   Nutrition Workshop Targeting Your Nutrition Priorities   Instruction Review Code 1- Verbalizes Understanding   Class Start Time 1145   Class Stop Time 1225   Class Time Calculation (min) 40 min    Row Name 03/09/23 1200      Education   Cardiac Education Topics Pritikin   Customer service manager   Weekly Topic One-Pot Wonders   Instruction Review Code 1- Verbalizes Understanding   Class Start Time 1145   Class Stop Time 1225   Class Time Calculation (min) 40 min    Row Name 03/11/23 1200     Education   Cardiac Education Topics Pritikin   Hospital doctor Education   General Education Hypertension and Heart Disease   Instruction Review Code 1- Verbalizes Understanding   Class Start Time 1157   Class Stop Time 1240   Class Time Calculation (min) 43 min    Row Name 03/14/23 1300     Education   Cardiac Education Topics Pritikin   Licensed conveyancer Nutrition   Nutrition Dining Out - Part 1   Instruction Review Code 1- Verbalizes Understanding   Class Start Time 1145   Class Stop Time 1230   Class Time Calculation (min) 45 min    Row Name 03/23/23 1500     Education   Cardiac Education Topics Pritikin   Customer service manager   Weekly Topic Fast Evening Meals   Instruction Review Code 1- Verbalizes Understanding   Class Start Time 1145   Class Stop Time 1230   Class Time Calculation (min) 45 min    Row Name 03/25/23 1400     Education   Cardiac Education Topics Pritikin   Nurse, children's   Educator Dietitian   Select Nutrition   Nutrition Vitamins and Minerals   Instruction Review Code 1- Verbalizes Understanding   Class Start Time 1145   Class Stop Time 1226   Class Time Calculation (min) 41 min  Row Name 03/28/23 1300     Education   Cardiac Education Topics Pritikin   Glass blower/designer Nutrition   Nutrition Workshop Fueling a Forensic psychologist   Instruction Review Code 1- Tax inspector   Class Start  Time 1145   Class Stop Time 1230   Class Time Calculation (min) 45 min    Row Name 03/30/23 1500     Education   Cardiac Education Topics Pritikin   Customer service manager   Weekly Topic International Cuisine- Spotlight on the [employer] Zones   Instruction Review Code 1- Verbalizes Understanding   Class Start Time 1145   Class Stop Time 1221   Class Time Calculation (min) 36 min    Row Name 04/01/23 1200     Education   Cardiac Education Topics Pritikin   Psychologist, forensic Exercise Education   Exercise Education Improving Performance   Instruction Review Code 1- Verbalizes Understanding   Class Start Time 1150   Class Stop Time 1230   Class Time Calculation (min) 40 min    Row Name 04/11/23 1400     Education   Cardiac Education Topics Pritikin   Select Workshops     Workshops   Educator Exercise Physiologist   Select Exercise   Exercise Workshop Managing Heart Disease: Your Path to a Healthier Heart   Instruction Review Code 1- Verbalizes Understanding   Class Start Time 1135   Class Stop Time 1219   Class Time Calculation (min) 44 min    Row Name 04/13/23 1500     Education   Cardiac Education Topics Pritikin   Orthoptist   Educator Dietitian   Weekly Topic Powerhouse Plant-Based Proteins   Instruction Review Code 1- Verbalizes Understanding   Class Start Time 1145   Class Stop Time 1222   Class Time Calculation (min) 37 min    Row Name 04/15/23 1200     Education   Cardiac Education Topics Pritikin   Hospital doctor Education   General Education Hypertension and Heart Disease   Instruction Review Code 1- Verbalizes Understanding   Class Start Time 1153   Class Stop Time 1235   Class Time Calculation (min) 42 min    Row Name 04/18/23 1600      Education   Cardiac Education Topics Pritikin   Geographical information systems officer Psychosocial   Psychosocial Workshop From Head to Heart: The Power of a Healthy Outlook   Instruction Review Code 1- Verbalizes Understanding   Class Start Time 1150   Class Stop Time 1235   Class Time Calculation (min) 45 min    Row Name 04/20/23 1300     Education   Cardiac Education Topics Pritikin   Customer service manager   Weekly Topic Adding Flavor - Sodium-Free   Instruction Review Code 1- Verbalizes Understanding   Class Start Time 1140   Class Stop Time 1220   Class Time Calculation (min) 40 min    Row Name 04/22/23 1200     Education   Cardiac Education Topics Pritikin  Select Core Videos     Core Videos   Educator Exercise Industrial/product designer Education   General Education Heart Disease Risk Reduction   Instruction Review Code 1- Verbalizes Understanding   Class Start Time 1157   Class Stop Time 1234   Class Time Calculation (min) 37 min    Row Name 04/25/23 1300     Education   Cardiac Education Topics Pritikin   Geographical information systems officer Exercise   Exercise Workshop Location manager and Fall Prevention   Instruction Review Code 1- Verbalizes Understanding   Class Start Time 1150   Class Stop Time 1240   Class Time Calculation (min) 50 min    Row Name 04/27/23 1300     Education   Cardiac Education Topics Pritikin   Customer service manager   Weekly Topic Fast and Healthy Breakfasts   Instruction Review Code 1- Verbalizes Understanding   Class Start Time 1145   Class Stop Time 1225   Class Time Calculation (min) 40 min    Row Name 05/02/23 1200     Education   Cardiac Education Topics Pritikin   Nurse, children's Exercise Physiologist   Select  Psychosocial   Psychosocial Healthy Minds, Bodies, Hearts   Instruction Review Code 1- Verbalizes Understanding   Class Start Time 1142   Class Stop Time 1218   Class Time Calculation (min) 36 min    Row Name 05/04/23 1200     Education   Cardiac Education Topics Pritikin   Select Core Videos     Core Videos   Educator Nurse   Select Nutrition   Nutrition Becoming a Pritikin Chef   Instruction Review Code 1- Verbalizes Understanding   Class Start Time 1146   Class Stop Time 1224   Class Time Calculation (min) 38 min    Row Name 05/09/23 1300     Education   Cardiac Education Topics Pritikin   Select Core Videos     Core Videos   Educator Exercise Physiologist   Select Nutrition   Nutrition Other   Instruction Review Code 1- Verbalizes Understanding   Class Start Time 1151   Class Stop Time 1227   Class Time Calculation (min) 36 min    Row Name 05/11/23 1400     Education   Cardiac Education Topics Pritikin   Customer service manager   Weekly Topic Tasty Appetizers and Snacks   Instruction Review Code 1- Verbalizes Understanding   Class Start Time 1145   Class Stop Time 1223   Class Time Calculation (min) 38 min    Row Name 05/16/23 1600     Education   Cardiac Education Topics Pritikin   Select Workshops     Workshops   Educator Exercise Physiologist   Select Psychosocial   Psychosocial Workshop Recognizing and Reducing Stress   Instruction Review Code 1- Verbalizes Understanding   Class Start Time 1142   Class Stop Time 1231   Class Time Calculation (min) 49 min    Row Name 05/18/23 1400     Education   Cardiac Education Topics Pritikin   Customer service manager   Weekly Topic [employer] - Meals in a Snap  Instruction Review Code 1- Verbalizes Understanding   Class Start Time 1143   Class Stop Time 1226   Class Time Calculation (min) 43 min             Core Videos: Exercise    Move It!  Clinical staff conducted group or individual video education with verbal and written material and guidebook.  Patient learns the recommended Pritikin exercise program. Exercise with the goal of living a long, healthy life. Some of the health benefits of exercise include controlled diabetes, healthier blood pressure levels, improved cholesterol levels, improved heart and lung capacity, improved sleep, and better body composition. Everyone should speak with their doctor before starting or changing an exercise routine.  Biomechanical Limitations Clinical staff conducted group or individual video education with verbal and written material and guidebook.  Patient learns how biomechanical limitations can impact exercise and how we can mitigate and possibly overcome limitations to have an impactful and balanced exercise routine.  Body Composition Clinical staff conducted group or individual video education with verbal and written material and guidebook.  Patient learns that body composition (ratio of muscle mass to fat mass) is a key component to assessing overall fitness, rather than body weight alone. Increased fat mass, especially visceral belly fat, can put [place] at increased risk for metabolic syndrome, type 2 diabetes, heart disease, and even death. It is recommended to combine diet and exercise (cardiovascular and resistance training) to improve your body composition. Seek guidance from your physician and exercise physiologist before implementing an exercise routine.  Exercise Action Plan Clinical staff conducted group or individual video education with verbal and written material and guidebook.  Patient learns the recommended strategies to achieve and enjoy long-term exercise adherence, including variety, self-motivation, self-efficacy, and positive decision making. Benefits of exercise include fitness, good health, weight management, more energy, better sleep,  less stress, and overall well-being.  Medical   Heart Disease Risk Reduction Clinical staff conducted group or individual video education with verbal and written material and guidebook.  Patient learns our heart is our most vital organ as it circulates oxygen, nutrients, white blood cells, and hormones throughout the entire body, and carries waste away. Data supports a plant-based eating plan like the Pritikin Program for its effectiveness in slowing progression of and reversing heart disease. The video provides a number of recommendations to address heart disease.   Metabolic Syndrome and Belly Fat  Clinical staff conducted group or individual video education with verbal and written material and guidebook.  Patient learns what metabolic syndrome is, how it leads to heart disease, and how one can reverse it and keep it from coming back. You have metabolic syndrome if you have 3 of the following 5 criteria: abdominal obesity, high blood pressure, high triglycerides, low HDL cholesterol, and high blood sugar.  Hypertension and Heart Disease Clinical staff conducted group or individual video education with verbal and written material and guidebook.  Patient learns that high blood pressure, or hypertension, is very common in the [place]. Hypertension is largely due to excessive salt intake, but other important risk factors include being overweight, physical inactivity, drinking too much alcohol, smoking, and not eating enough potassium from fruits and vegetables. High blood pressure is a leading risk factor for heart attack, stroke, congestive heart failure, dementia, kidney failure, and premature death. Long-term effects of excessive salt intake include stiffening of the arteries and thickening of heart muscle and organ damage. Recommendations include ways to reduce hypertension and the risk of heart disease.  Diseases of Our Time - Focusing on Diabetes Clinical staff conducted group or individual  video education with verbal and written material and guidebook.  Patient learns why the best way to stop diseases of our time is prevention, through food and other lifestyle changes. Medicine (such as prescription pills and surgeries) is often only a Band-Aid on the problem, not a long-term solution. Most common diseases of our time include obesity, type 2 diabetes, hypertension, heart disease, and cancer. The Pritikin Program is recommended and has been proven to help reduce, reverse, and/or prevent the damaging effects of metabolic syndrome.  Nutrition   Overview of the Pritikin Eating Plan  Clinical staff conducted group or individual video education with verbal and written material and guidebook.  Patient learns about the Pritikin Eating Plan for disease risk reduction. The Pritikin Eating Plan emphasizes a wide variety of unrefined, minimally-processed carbohydrates, like fruits, vegetables, whole grains, and legumes. Go, Caution, and Stop food choices are explained. Plant-based and lean animal proteins are emphasized. Rationale provided for low sodium intake for blood pressure control, low added sugars for blood sugar stabilization, and low added fats and oils for coronary artery disease risk reduction and weight management.  Calorie Density  Clinical staff conducted group or individual video education with verbal and written material and guidebook.  Patient learns about calorie density and how it impacts the Pritikin Eating Plan. Knowing the characteristics of the food you choose will help you decide whether those foods will lead to weight gain or weight loss, and whether you want to consume more or less of them. Weight loss is usually a side effect of the Pritikin Eating Plan because of its focus on low calorie-dense foods.  Label Reading  Clinical staff conducted group or individual video education with verbal and written material and guidebook.  Patient learns about the Pritikin recommended  label reading guidelines and corresponding recommendations regarding calorie density, added sugars, sodium content, and whole grains.  Dining Out - Part 1  Clinical staff conducted group or individual video education with verbal and written material and guidebook.  Patient learns that restaurant meals can be sabotaging because they can be so high in calories, fat, sodium, and/or sugar. Patient learns recommended strategies on how to positively address this and avoid unhealthy pitfalls.  Facts on Fats  Clinical staff conducted group or individual video education with verbal and written material and guidebook.  Patient learns that lifestyle modifications can be just as effective, if not more so, as many medications for lowering your risk of heart disease. A Pritikin lifestyle can help to reduce your risk of inflammation and atherosclerosis (cholesterol build-up, or plaque, in the artery walls). Lifestyle interventions such as dietary choices and physical activity address the cause of atherosclerosis. A review of the types of fats and their impact on blood cholesterol levels, along with dietary recommendations to reduce fat intake is also included.  Nutrition Action Plan  Clinical staff conducted group or individual video education with verbal and written material and guidebook.  Patient learns how to incorporate Pritikin recommendations into their lifestyle. Recommendations include planning and keeping personal health goals in mind as an important part of their success.  Healthy Mind-Set    Healthy Minds, Bodies, Hearts  Clinical staff conducted group or individual video education with verbal and written material and guidebook.  Patient learns how to identify when they are stressed. Video will discuss the impact of that stress, as well as the many benefits of stress management. Patient will  also be introduced to stress management techniques. The way we think, act, and feel has an impact on our  hearts.  How Our Thoughts Can Heal Our Hearts  Clinical staff conducted group or individual video education with verbal and written material and guidebook.  Patient learns that negative thoughts can cause depression and anxiety. This can result in negative lifestyle behavior and serious health problems. Cognitive behavioral therapy is an effective method to help control our thoughts in order to change and improve our emotional outlook.  Additional Videos:  Exercise    Improving Performance  Clinical staff conducted group or individual video education with verbal and written material and guidebook.  Patient learns to use a non-linear approach by alternating intensity levels and lengths of time spent exercising to help burn more calories and lose more body fat. Cardiovascular exercise helps improve heart health, metabolism, hormonal balance, blood sugar control, and recovery from fatigue. Resistance training improves strength, endurance, balance, coordination, reaction time, metabolism, and muscle mass. Flexibility exercise improves circulation, posture, and balance. Seek guidance from your physician and exercise physiologist before implementing an exercise routine and learn your capabilities and proper form for all exercise.  Introduction to Yoga  Clinical staff conducted group or individual video education with verbal and written material and guidebook.  Patient learns about yoga, a discipline of the coming together of mind, breath, and body. The benefits of yoga include improved flexibility, improved range of motion, better posture and core strength, increased lung function, weight loss, and positive self-image. Yoga's heart health benefits include lowered blood pressure, healthier heart rate, decreased cholesterol and triglyceride levels, improved immune function, and reduced stress. Seek guidance from your physician and exercise physiologist before implementing an exercise routine and learn your  capabilities and proper form for all exercise.  Medical   Aging: Enhancing Your Quality of Life  Clinical staff conducted group or individual video education with verbal and written material and guidebook.  Patient learns key strategies and recommendations to stay in good physical health and enhance quality of life, such as prevention strategies, having an advocate, securing a Health Care Proxy and Power of Attorney, and keeping a list of medications and system for tracking them. It also discusses how to avoid risk for bone loss.  Biology of Weight Control  Clinical staff conducted group or individual video education with verbal and written material and guidebook.  Patient learns that weight gain occurs because we consume more calories than we burn (eating more, moving less). Even if your body weight is normal, you may have higher ratios of fat compared to muscle mass. Too much body fat puts you at increased risk for cardiovascular disease, heart attack, stroke, type 2 diabetes, and obesity-related cancers. In addition to exercise, following the Pritikin Eating Plan can help reduce your risk.  Decoding Lab Results  Clinical staff conducted group or individual video education with verbal and written material and guidebook.  Patient learns that lab test reflects one measurement whose values change over time and are influenced by many factors, including medication, stress, sleep, exercise, food, hydration, pre-existing medical conditions, and more. It is recommended to use the knowledge from this video to become more involved with your lab results and evaluate your numbers to speak with your doctor.   Diseases of Our Time - Overview  Clinical staff conducted group or individual video education with verbal and written material and guidebook.  Patient learns that according to the CDC, 50% to 70% of chronic diseases (such as  obesity, type 2 diabetes, elevated lipids, hypertension, and heart disease) are  avoidable through lifestyle improvements including healthier food choices, listening to satiety cues, and increased physical activity.  Sleep Disorders Clinical staff conducted group or individual video education with verbal and written material and guidebook.  Patient learns how good quality and duration of sleep are important to overall health and well-being. Patient also learns about sleep disorders and how they impact health along with recommendations to address them, including discussing with a physician.  Nutrition  Dining Out - Part 2 Clinical staff conducted group or individual video education with verbal and written material and guidebook.  Patient learns how to plan ahead and communicate in order to maximize their dining experience in a healthy and nutritious manner. Included are recommended food choices based on the type of restaurant the patient is visiting.   Fueling a Banker conducted group or individual video education with verbal and written material and guidebook.  There is a strong connection between our food choices and our health. Diseases like obesity and type 2 diabetes are very prevalent and are in large-part due to lifestyle choices. The Pritikin Eating Plan provides plenty of food and hunger-curbing satisfaction. It is easy to follow, affordable, and helps reduce health risks.  Menu Workshop  Clinical staff conducted group or individual video education with verbal and written material and guidebook.  Patient learns that restaurant meals can sabotage health goals because they are often packed with calories, fat, sodium, and sugar. Recommendations include strategies to plan ahead and to communicate with the manager, chef, or server to help order a healthier meal.  Planning Your Eating Strategy  Clinical staff conducted group or individual video education with verbal and written material and guidebook.  Patient learns about the Pritikin Eating Plan and  its benefit of reducing the risk of disease. The Pritikin Eating Plan does not focus on calories. Instead, it emphasizes high-quality, nutrient-rich foods. By knowing the characteristics of the foods, we choose, we can determine their calorie density and make informed decisions.  Targeting Your Nutrition Priorities  Clinical staff conducted group or individual video education with verbal and written material and guidebook.  Patient learns that lifestyle habits have a tremendous impact on disease risk and progression. This video provides eating and physical activity recommendations based on your personal health goals, such as reducing LDL cholesterol, losing weight, preventing or controlling type 2 diabetes, and reducing high blood pressure.  Vitamins and Minerals  Clinical staff conducted group or individual video education with verbal and written material and guidebook.  Patient learns different ways to obtain key vitamins and minerals, including through a recommended healthy diet. It is important to discuss all supplements you take with your doctor.   Healthy Mind-Set    Smoking Cessation  Clinical staff conducted group or individual video education with verbal and written material and guidebook.  Patient learns that cigarette smoking and tobacco addiction pose a serious health risk which affects millions of people. Stopping smoking will significantly reduce the risk of heart disease, lung disease, and many forms of cancer. Recommended strategies for quitting are covered, including working with your doctor to develop a successful plan.  Culinary   Becoming a Set designer conducted group or individual video education with verbal and written material and guidebook.  Patient learns that cooking at home can be healthy, cost-effective, quick, and puts them in control. Keys to cooking healthy recipes will include looking at your recipe,  assessing your equipment needs, planning ahead,  making it simple, choosing cost-effective seasonal ingredients, and limiting the use of added fats, salts, and sugars.  Cooking - Breakfast and Snacks  Clinical staff conducted group or individual video education with verbal and written material and guidebook.  Patient learns how important breakfast is to satiety and nutrition through the entire day. Recommendations include key foods to eat during breakfast to help stabilize blood sugar levels and to prevent overeating at meals later in the day. Planning ahead is also a key component.  Cooking - Educational psychologist conducted group or individual video education with verbal and written material and guidebook.  Patient learns eating strategies to improve overall health, including an approach to cook more at home. Recommendations include thinking of animal protein as a side on your plate rather than center stage and focusing instead on lower calorie dense options like vegetables, fruits, whole grains, and plant-based proteins, such as beans. Making sauces in large quantities to freeze for later and leaving the skin on your vegetables are also recommended to maximize your experience.  Cooking - Healthy Salads and Dressing Clinical staff conducted group or individual video education with verbal and written material and guidebook.  Patient learns that vegetables, fruits, whole grains, and legumes are the foundations of the Pritikin Eating Plan. Recommendations include how to incorporate each of these in flavorful and healthy salads, and how to create homemade salad dressings. Proper handling of ingredients is also covered. Cooking - Soups and [employer] - Soups and Desserts Clinical staff conducted group or individual video education with verbal and written material and guidebook.  Patient learns that Pritikin soups and desserts make for easy, nutritious, and delicious snacks and meal components that are low in sodium, fat, sugar, and  calorie density, while high in vitamins, minerals, and filling fiber. Recommendations include simple and healthy ideas for soups and desserts.   Overview     The Pritikin Solution Program Overview Clinical staff conducted group or individual video education with verbal and written material and guidebook.  Patient learns that the results of the Pritikin Program have been documented in more than 100 articles published in peer-reviewed journals, and the benefits include reducing risk factors for (and, in some cases, even reversing) high cholesterol, high blood pressure, type 2 diabetes, obesity, and more! An overview of the three key pillars of the Pritikin Program will be covered: eating well, doing regular exercise, and having a healthy mind-set.  WORKSHOPS  Exercise: Exercise Basics: Building Your Action Plan Clinical staff led group instruction and group discussion with PowerPoint presentation and patient guidebook. To enhance the learning environment the use of posters, models and videos may be added. At the conclusion of this workshop, patients will comprehend the difference between physical activity and exercise, as well as the benefits of incorporating both, into their routine. Patients will understand the FITT (Frequency, Intensity, Time, and Type) principle and how to use it to build an exercise action plan. In addition, safety concerns and other considerations for exercise and cardiac rehab will be addressed by the presenter. The purpose of this lesson is to promote a comprehensive and effective weekly exercise routine in order to improve patients' overall level of fitness.   Managing Heart Disease: Your Path to a Healthier Heart Clinical staff led group instruction and group discussion with PowerPoint presentation and patient guidebook. To enhance the learning environment the use of posters, models and videos may be added.At the conclusion of  this workshop, patients will understand the  anatomy and physiology of the heart. Additionally, they will understand how Pritikin's three pillars impact the risk factors, the progression, and the management of heart disease.  The purpose of this lesson is to provide a high-level overview of the heart, heart disease, and how the Pritikin lifestyle positively impacts risk factors.  Exercise Biomechanics Clinical staff led group instruction and group discussion with PowerPoint presentation and patient guidebook. To enhance the learning environment the use of posters, models and videos may be added. Patients will learn how the structural parts of their bodies function and how these functions impact their daily activities, movement, and exercise. Patients will learn how to promote a neutral spine, learn how to manage pain, and identify ways to improve their physical movement in order to promote healthy living. The purpose of this lesson is to expose patients to common physical limitations that impact physical activity. Participants will learn practical ways to adapt and manage aches and pains, and to minimize their effect on regular exercise. Patients will learn how to maintain good posture while sitting, walking, and lifting.  Balance Training and Fall Prevention  Clinical staff led group instruction and group discussion with PowerPoint presentation and patient guidebook. To enhance the learning environment the use of posters, models and videos may be added. At the conclusion of this workshop, patients will understand the importance of their sensorimotor skills (vision, proprioception, and the vestibular system) in maintaining their ability to balance as they age. Patients will apply a variety of balancing exercises that are appropriate for their current level of function. Patients will understand the common causes for poor balance, possible solutions to these problems, and ways to modify their physical environment in order to minimize their  fall risk. The purpose of this lesson is to teach patients about the importance of maintaining balance as they age and ways to minimize their risk of falling.  WORKSHOPS   Nutrition:  Fueling a Ship broker led group instruction and group discussion with PowerPoint presentation and patient guidebook. To enhance the learning environment the use of posters, models and videos may be added. Patients will review the foundational principles of the Pritikin Eating Plan and understand what constitutes a serving size in each of the food groups. Patients will also learn Pritikin-friendly foods that are better choices when away from home and review make-ahead meal and snack options. Calorie density will be reviewed and applied to three nutrition priorities: weight maintenance, weight loss, and weight gain. The purpose of this lesson is to reinforce (in a group setting) the key concepts around what patients are recommended to eat and how to apply these guidelines when away from home by planning and selecting Pritikin-friendly options. Patients will understand how calorie density may be adjusted for different weight management goals.  Mindful Eating  Clinical staff led group instruction and group discussion with PowerPoint presentation and patient guidebook. To enhance the learning environment the use of posters, models and videos may be added. Patients will briefly review the concepts of the Pritikin Eating Plan and the importance of low-calorie dense foods. The concept of mindful eating will be introduced as well as the importance of paying attention to internal hunger signals. Triggers for non-hunger eating and techniques for dealing with triggers will be explored. The purpose of this lesson is to provide patients with the opportunity to review the basic principles of the Pritikin Eating Plan, discuss the value of eating mindfully and how to measure internal  cues of hunger and fullness using the  Hunger Scale. Patients will also discuss reasons for non-hunger eating and learn strategies to use for controlling emotional eating.  Targeting Your Nutrition Priorities Clinical staff led group instruction and group discussion with PowerPoint presentation and patient guidebook. To enhance the learning environment the use of posters, models and videos may be added. Patients will learn how to determine their genetic susceptibility to disease by reviewing their family history. Patients will gain insight into the importance of diet as part of an overall healthy lifestyle in mitigating the impact of genetics and other environmental insults. The purpose of this lesson is to provide patients with the opportunity to assess their personal nutrition priorities by looking at their family history, their own health history and current risk factors. Patients will also be able to discuss ways of prioritizing and modifying the Pritikin Eating Plan for their highest risk areas  Menu  Clinical staff led group instruction and group discussion with PowerPoint presentation and patient guidebook. To enhance the learning environment the use of posters, models and videos may be added. Using menus brought in from [employer], or printed from [employer], patients will apply the Pritikin dining out guidelines that were presented in the [employer] video. Patients will also be able to practice these guidelines in a variety of provided scenarios. The purpose of this lesson is to provide patients with the opportunity to practice hands-on learning of the Pritikin Dining Out guidelines with actual menus and practice scenarios.  Label Reading Clinical staff led group instruction and group discussion with PowerPoint presentation and patient guidebook. To enhance the learning environment the use of posters, models and videos may be added. Patients will review and discuss the Pritikin label reading guidelines  presented in Pritikin's Label Reading Educational series video. Using fool labels brought in from local grocery stores and markets, patients will apply the label reading guidelines and determine if the packaged food meet the Pritikin guidelines. The purpose of this lesson is to provide patients with the opportunity to review, discuss, and practice hands-on learning of the Pritikin Label Reading guidelines with actual packaged food labels. Cooking School  Pritikin's [employer] are designed to teach patients ways to prepare quick, simple, and affordable recipes at home. The importance of nutrition's role in chronic disease risk reduction is reflected in its emphasis in the overall Pritikin program. By learning how to prepare essential core Pritikin Eating Plan recipes, patients will increase control over what they eat; be able to customize the flavor of foods without the use of added salt, sugar, or fat; and improve the quality of the food they consume. By learning a set of core recipes which are easily assembled, quickly prepared, and affordable, patients are more likely to prepare more healthy foods at home. These workshops focus on convenient breakfasts, simple entres, side dishes, and desserts which can be prepared with minimal effort and are consistent with nutrition recommendations for cardiovascular risk reduction. Cooking [employer] are taught by a Armed forces logistics/support/administrative officer (RD) who has been trained by the [employer]. The chef or RD has a clear understanding of the importance of minimizing - if not completely eliminating - added fat, sugar, and sodium in recipes. Throughout the series of Cooking School Workshop sessions, patients will learn about healthy ingredients and efficient methods of cooking to build confidence in their capability to prepare    Cooking School weekly topics:  Adding Flavor- Sodium-Free  Fast  and Healthy Breakfasts  Powerhouse Plant-Based  Proteins  Satisfying Salads and Dressings  Simple Sides and Sauces  International Cuisine-Spotlight on the Blue Zones  Delicious Desserts  Savory Soups  Efficiency Cooking - Meals in a Snap  Tasty Appetizers and Snacks  Comforting Weekend Breakfasts  One-Pot Wonders   Fast Evening Meals  Landscape architect Your Pritikin Plate  WORKSHOPS   Healthy Mindset (Psychosocial):  Focused Goals, Sustainable Changes Clinical staff led group instruction and group discussion with PowerPoint presentation and patient guidebook. To enhance the learning environment the use of posters, models and videos may be added. Patients will be able to apply effective goal setting strategies to establish at least one personal goal, and then take consistent, meaningful action toward that goal. They will learn to identify common barriers to achieving personal goals and develop strategies to overcome them. Patients will also gain an understanding of how our mind-set can impact our ability to achieve goals and the importance of cultivating a positive and growth-oriented mind-set. The purpose of this lesson is to provide patients with a deeper understanding of how to set and achieve personal goals, as well as the tools and strategies needed to overcome common obstacles which may arise along the way.  From Head to Heart: The Power of a Healthy Outlook  Clinical staff led group instruction and group discussion with PowerPoint presentation and patient guidebook. To enhance the learning environment the use of posters, models and videos may be added. Patients will be able to recognize and describe the impact of emotions and mood on physical health. They will discover the importance of self-care and explore self-care practices which may work for them. Patients will also learn how to utilize the 4 C's to cultivate a healthier outlook and better manage stress and challenges. The purpose of this lesson is to demonstrate  to patients how a healthy outlook is an essential part of maintaining good health, especially as they continue their cardiac rehab journey.  Healthy Sleep for a Healthy Heart Clinical staff led group instruction and group discussion with PowerPoint presentation and patient guidebook. To enhance the learning environment the use of posters, models and videos may be added. At the conclusion of this workshop, patients will be able to demonstrate knowledge of the importance of sleep to overall health, well-being, and quality of life. They will understand the symptoms of, and treatments for, common sleep disorders. Patients will also be able to identify daytime and nighttime behaviors which impact sleep, and they will be able to apply these tools to help manage sleep-related challenges. The purpose of this lesson is to provide patients with a general overview of sleep and outline the importance of quality sleep. Patients will learn about a few of the most common sleep disorders. Patients will also be introduced to the concept of "sleep hygiene," and discover ways to self-manage certain sleeping problems through simple daily behavior changes. Finally, the workshop will motivate patients by clarifying the links between quality sleep and their goals of heart-healthy living.   Recognizing and Reducing Stress Clinical staff led group instruction and group discussion with PowerPoint presentation and patient guidebook. To enhance the learning environment the use of posters, models and videos may be added. At the conclusion of this workshop, patients will be able to understand the types of stress reactions, differentiate between acute and chronic stress, and recognize the impact that chronic stress has on their health. They will also be able to apply different coping mechanisms, such as  reframing negative self-talk. Patients will have the opportunity to practice a variety of stress management techniques, such as deep  abdominal breathing, progressive muscle relaxation, and/or guided imagery.  The purpose of this lesson is to educate patients on the role of stress in their lives and to provide healthy techniques for coping with it.  Learning Barriers/Preferences:  Learning Barriers/Preferences - 02/08/23 1324       Learning Barriers/Preferences   Learning Barriers Sight   wears glasses   Learning Preferences Audio;Computer/Internet;Group Instruction;Individual Instruction;Pictoral;Skilled Demonstration;Verbal Instruction;Video             Education Topics:  Knowledge Questionnaire Score:  Knowledge Questionnaire Score - 05/16/23 1500       Knowledge Questionnaire Score   Post Score 23/24             Core Components/Risk Factors/Patient Goals at Admission:  Personal Goals and Risk Factors at Admission - 02/08/23 1224       Core Components/Risk Factors/Patient Goals on Admission    Weight Management Yes;Weight Loss    Intervention Weight Management: Develop a combined nutrition and exercise program designed to reach desired caloric intake, while maintaining appropriate intake of nutrient and fiber, sodium and fats, and appropriate energy expenditure required for the weight goal.;Weight Management: Provide education and appropriate resources to help participant work on and attain dietary goals.;Weight Management/Obesity: Establish reasonable short term and long term weight goals.    Admit Weight 175 lb 14.8 oz (79.8 kg)    Expected Outcomes Weight Loss: Understanding of general recommendations for a balanced deficit meal plan, which promotes 1-2 lb weight loss per week and includes a negative energy balance of [id number] kcal/d;Short Term: Continue to assess and modify interventions until short term weight is achieved;Long Term: Adherence to nutrition and physical activity/exercise program aimed toward attainment of established weight goal;Understanding of distribution of calorie intake throughout  the day with the consumption of 4-5 meals/snacks;Understanding recommendations for meals to include 15-35% energy as protein, 25-35% energy from fat, 35-60% energy from carbohydrates, less than 200mg  of dietary cholesterol, 20-35 gm of total fiber daily    Hypertension Yes    Intervention Provide education on lifestyle modifcations including regular physical activity/exercise, weight management, moderate sodium restriction and increased consumption of fresh fruit, vegetables, and low fat dairy, alcohol moderation, and smoking cessation.;Monitor prescription use compliance.    Expected Outcomes Short Term: Continued assessment and intervention until BP is < 140/75mm HG in hypertensive participants. < 130/41mm HG in hypertensive participants with diabetes, heart failure or chronic kidney disease.;Long Term: Maintenance of blood pressure at goal levels.    Lipids Yes    Intervention Provide education and support for participant on nutrition & aerobic/resistive exercise along with prescribed medications to achieve LDL 70mg , HDL >40mg .    Expected Outcomes Short Term: Participant states understanding of desired cholesterol values and is compliant with medications prescribed. Participant is following exercise prescription and nutrition guidelines.;Long Term: Cholesterol controlled with medications as prescribed, with individualized exercise RX and with personalized nutrition plan. Value goals: LDL < 70mg , HDL > 40 mg.             Core Components/Risk Factors/Patient Goals Review:   Goals and Risk Factor Review     Row Name 02/14/23 1416 03/01/23 1359 03/29/23 1316 04/26/23 1241 05/18/23 1643     Core Components/Risk Factors/Patient Goals Review   Personal Goals Review Weight Management/Obesity;Hypertension;Lipids Weight Management/Obesity;Hypertension;Lipids Weight Management/Obesity;Hypertension;Lipids Weight Management/Obesity;Hypertension;Lipids Weight Management/Obesity;Hypertension;Lipids    Review Aliviya started cardiac rehab on 02/14/23 and  did well with exercise. Vital signs and CBg's were stable Darryl started cardiac rehab on 02/14/23 and doing  well with exercise. Vital signs have been stable Yashi continues to  do well with exercise. Vital signs remain stable Inesha continues to  do well with exercise. Vital signs remain stable. Trayana has lost 1.7 kg since starting cardiac rehab. Jaelyne continues to  do well with exercise. Vital signs remain stable. Cassidie has lost 2.0 kg since starting cardiac rehab. Erina will complete cardiac rehab on 05/27/23.   Expected Outcomes Coretha will continue to participate in cardiac rehab for exercise, nutrition and lifestyle modifications Ninna will continue to participate in cardiac rehab for exercise, nutrition and lifestyle modifications Aleezay will continue to participate in cardiac rehab for exercise, nutrition and lifestyle modifications Saraiya will continue to participate in cardiac rehab for exercise, nutrition and lifestyle modifications Ciin will continue to participate in cardiac rehab for exercise, nutrition and lifestyle modifications            Core Components/Risk Factors/Patient Goals at Discharge (Final Review):   Goals and Risk Factor Review - 05/18/23 1643       Core Components/Risk Factors/Patient Goals Review   Personal Goals Review Weight Management/Obesity;Hypertension;Lipids    Review Melea continues to  do well with exercise. Vital signs remain stable. Shaia has lost 2.0 kg since starting cardiac rehab. Mashanda will complete cardiac rehab on 05/27/23.    Expected Outcomes Aarohi will continue to participate in cardiac rehab for exercise, nutrition and lifestyle modifications             ITP Comments:  ITP Comments     Row Name 02/08/23 1204 02/14/23 1413 03/01/23 1356 03/29/23 1315 04/26/23 1239   ITP Comments Armanda Magic, MD: Medical Director.  Intorduction to the [facility] / Intensive  Cardiac Rehab.  Initial orientation packet reviewed with the patient. 30 Day ITP Review. Michelleann started cardiac rehab on 02/14/23 and did well with exercise 30 Day ITP Review. Dalissa has good attendance and participation with exercise at cardiac rehab. 30 Day ITP Review. Chrystle continues to have  good attendance and participation with exercise at cardiac rehab. 30 Day ITP Review. Tanette continues to have  good attendance and participation with exercise at cardiac rehab.    Row Name 05/18/23 1640           ITP Comments 30 Day ITP Review. Jasminn continues to have  good attendance and participation with exercise at cardiac rehab. Sarahjean will complete cardiac rehab on 05/27/23.                Comments: See ITP comments.Thayer Headings RN BSN

## 2023-05-20 ENCOUNTER — Encounter (HOSPITAL_COMMUNITY)
Admission: RE | Admit: 2023-05-20 | Discharge: 2023-05-20 | Disposition: A | Discharge status: Home / Self Care | Payer: Medicare Other | Source: Ambulatory Visit | Attending: Interventional Cardiology

## 2023-05-20 DIAGNOSIS — Z955 Presence of coronary angioplasty implant and graft: Secondary | ICD-10-CM

## 2023-05-20 DIAGNOSIS — J449 Chronic obstructive pulmonary disease, unspecified: Secondary | ICD-10-CM | POA: Diagnosis not present

## 2023-05-20 DIAGNOSIS — Z48812 Encounter for surgical aftercare following surgery on the circulatory system: Secondary | ICD-10-CM | POA: Diagnosis not present

## 2023-05-23 ENCOUNTER — Encounter (HOSPITAL_COMMUNITY)
Admission: RE | Admit: 2023-05-23 | Discharge: 2023-05-23 | Disposition: A | Discharge status: Home / Self Care | Payer: Medicare Other | Source: Ambulatory Visit | Attending: Interventional Cardiology | Admitting: Interventional Cardiology

## 2023-05-23 DIAGNOSIS — Z955 Presence of coronary angioplasty implant and graft: Secondary | ICD-10-CM

## 2023-05-23 DIAGNOSIS — J449 Chronic obstructive pulmonary disease, unspecified: Secondary | ICD-10-CM

## 2023-05-23 DIAGNOSIS — Z48812 Encounter for surgical aftercare following surgery on the circulatory system: Secondary | ICD-10-CM | POA: Diagnosis not present

## 2023-05-24 DIAGNOSIS — Z961 Presence of intraocular lens: Secondary | ICD-10-CM | POA: Diagnosis not present

## 2023-05-24 DIAGNOSIS — H52203 Unspecified astigmatism, bilateral: Secondary | ICD-10-CM | POA: Diagnosis not present

## 2023-05-24 DIAGNOSIS — H18603 Keratoconus, unspecified, bilateral: Secondary | ICD-10-CM | POA: Diagnosis not present

## 2023-05-25 ENCOUNTER — Encounter (HOSPITAL_COMMUNITY)
Admission: RE | Admit: 2023-05-25 | Discharge: 2023-05-25 | Disposition: A | Discharge status: Home / Self Care | Payer: Medicare Other | Source: Ambulatory Visit | Attending: Interventional Cardiology | Admitting: Interventional Cardiology

## 2023-05-25 DIAGNOSIS — Z48812 Encounter for surgical aftercare following surgery on the circulatory system: Secondary | ICD-10-CM | POA: Diagnosis not present

## 2023-05-25 DIAGNOSIS — Z955 Presence of coronary angioplasty implant and graft: Secondary | ICD-10-CM | POA: Diagnosis not present

## 2023-05-25 DIAGNOSIS — J449 Chronic obstructive pulmonary disease, unspecified: Secondary | ICD-10-CM | POA: Diagnosis not present

## 2023-05-25 NOTE — Progress Notes (Incomplete)
Discharge Progress Report  Patient Details  Name: Chastin Farinha MRN: [id number] Date of Birth: Nov 07, 1955 Referring Provider:   Flowsheet Row INTENSIVE CARDIAC REHAB ORIENT from 02/08/2023 in [facility]  Referring Provider Lance Muss, MD        Number of Visits: 68  Reason for Discharge:  Patient reached a stable level of exercise. Patient independent in their exercise. Patient has met program and personal goals.  Smoking History:  Social History   Tobacco Use  Smoking Status Former   Current packs/day: 0.00   Average packs/day: 0.5 packs/day for 40.0 years (20.0 ttl pk-yrs)   Types: Cigarettes   Start date: 03/17/1973   Quit date: 03/17/2013   Years since quitting: 10.1  Smokeless Tobacco Never    Diagnosis:  01/26/23 Status post coronary artery stent placement  ADL [facility]:   Initial Exercise Prescription:  Initial Exercise Prescription - 02/08/23 1300       Date of Initial Exercise RX and Referring Provider   Date 02/08/23    Referring Provider Lance Muss, MD    Expected Discharge Date 04/23/21      NuStep   Level 2    SPM 80    Minutes 15    METs 3.4      Recumbant Elliptical   Level 2    RPM 50    Watts 25    Minutes 15    METs 3.4      Prescription Details   Frequency (times per week) 3    Duration Progress to 30 minutes of continuous aerobic without signs/symptoms of physical distress      Intensity   THRR 40-80% of Max Heartrate 60-121    Ratings of Perceived Exertion 11-13    Perceived Dyspnea 0-4      Progression   Progression Continue progressive overload as per policy without signs/symptoms or physical distress.      Resistance Training   Training Prescription Yes    Weight 3 lbs    Reps 10-15             Discharge Exercise Prescription (Final Exercise Prescription Changes):  Exercise Prescription Changes - 05/02/23 1500       Response to  Exercise   Blood Pressure (Admit) 124/76    Blood Pressure (Exit) 110/62    Heart Rate (Admit) 79 bpm    Heart Rate (Exercise) 117 bpm    Heart Rate (Exit) 88 bpm    Rating of Perceived Exertion (Exercise) 12    Symptoms None    Comments Reviewed METs and goals    Duration Continue with 30 min of aerobic exercise without signs/symptoms of physical distress.    Intensity THRR unchanged      Progression   Progression Continue to progress workloads to maintain intensity without signs/symptoms of physical distress.    Average METs 3.15      Resistance Training   Training Prescription Yes    Weight 3 lbs    Reps 10-15    Time 10 Minutes      Interval Training   Interval Training No      NuStep   Level 4    SPM 91    Minutes 15    METs 2.8      Recumbant Elliptical   Level 4    RPM 64    Watts 84    Minutes 15    METs 3.3  Home Exercise Plan   Plans to continue exercise at Home (comment)    Frequency Add 2 additional days to program exercise sessions.    Initial Home Exercises Provided 04/18/23             Functional Capacity:  6 Minute Walk     Row Name 02/08/23 1143 05/18/23 1422       6 Minute Walk   Phase Initial Discharge    Distance 1374 feet 2020 feet    Distance % Change -- 47.02 %    Distance Feet Change -- 646 ft    Walk Time 6 minutes 6 minutes    # of Rest Breaks 0 0    MPH 2.6 3.82    METS 3.4 4.4    RPE 11 16    Perceived Dyspnea  1 3    VO2 Peak 11.8 15.4    Symptoms Yes (comment) Yes (comment)  4/10 discomfort in both ankles    Comments SOB, RPD = 1 --    Resting HR 79 bpm 86 bpm    Resting BP 126/72 112/78    Resting Oxygen Saturation  97 % --    Exercise Oxygen Saturation  during 6 min walk 96 % --    Max Ex. HR 119 bpm 98 bpm    Max Ex. BP 150/80 164/68    2 Minute Post BP 132/70 132/70      Interval Oxygen   Interval Oxygen? Yes --    Baseline Oxygen Saturation % 97 % --    1 Minute Oxygen Saturation % 94 % --    1  Minute Liters of Oxygen 0 L --    2 Minute Oxygen Saturation % 97 % --    2 Minute Liters of Oxygen 0 L --    3 Minute Oxygen Saturation % 95 % --    3 Minute Liters of Oxygen 0 L --    4 Minute Oxygen Saturation % 96 % --    4 Minute Liters of Oxygen 0 L --    5 Minute Oxygen Saturation % 95 % --    5 Minute Liters of Oxygen 0 L --    6 Minute Oxygen Saturation % 96 % --    6 Minute Liters of Oxygen 0 L --    2 Minute Post Oxygen Saturation % 99 % --             Psychological, QOL, Others - Outcomes: PHQ 2/9:    02/08/2023   12:06 PM 04/23/2021    1:57 PM 02/16/2021   11:40 AM 02/16/2021   11:39 AM 07/10/2018   10:03 AM  Depression screen PHQ 2/9  Decreased Interest 0 0  0 0  Down, Depressed, Hopeless 0 0  0 0  PHQ - 2 Score 0 0  0 0  Altered sleeping 1 1 3   0  Tired, decreased energy 1 1 0  1  Change in appetite 0 0 0  0  Feeling bad or failure about yourself  0 0 0  0  Trouble concentrating 0 0 0  0  Moving slowly or fidgety/restless 0 0 0  0  Suicidal thoughts 0 0 0  0  PHQ-9 Score 2 2   1   Difficult doing work/chores Not difficult at all Not difficult at all Not difficult at all  Not difficult at all    Quality of Life:  Quality of Life - 05/16/23 1500  Quality of Life Scores   Health/Function Pre 28.62 %    Health/Function Post 26.5 %    Health/Function % Change -7.41 %    Socioeconomic Pre 30 %    Socioeconomic Post 29 %    Socioeconomic % Change  -3.33 %    Psych/Spiritual Pre 30 %    Psych/Spiritual Post 25 %    Psych/Spiritual % Change -16.67 %    Family Pre 30 %    Family Post 30 %    Family % Change 0 %    GLOBAL Pre 29.38 %    GLOBAL Post 27.21 %    GLOBAL % Change -7.39 %             Personal Goals: Goals established at orientation with interventions provided to work toward goal.  Personal Goals and Risk Factors at Admission - 02/08/23 1224       Core Components/Risk Factors/Patient Goals on Admission    Weight Management  Yes;Weight Loss    Intervention Weight Management: Develop a combined nutrition and exercise program designed to reach desired caloric intake, while maintaining appropriate intake of nutrient and fiber, sodium and fats, and appropriate energy expenditure required for the weight goal.;Weight Management: Provide education and appropriate resources to help participant work on and attain dietary goals.;Weight Management/Obesity: Establish reasonable short term and long term weight goals.    Admit Weight 175 lb 14.8 oz (79.8 kg)    Expected Outcomes Weight Loss: Understanding of general recommendations for a balanced deficit meal plan, which promotes 1-2 lb weight loss per week and includes a negative energy balance of [phone] kcal/d;Short Term: Continue to assess and modify interventions until short term weight is achieved;Long Term: Adherence to nutrition and physical activity/exercise program aimed toward attainment of established weight goal;Understanding of distribution of calorie intake throughout the day with the consumption of 4-5 meals/snacks;Understanding recommendations for meals to include 15-35% energy as protein, 25-35% energy from fat, 35-60% energy from carbohydrates, less than 200mg  of dietary cholesterol, 20-35 gm of total fiber daily    Hypertension Yes    Intervention Provide education on lifestyle modifcations including regular physical activity/exercise, weight management, moderate sodium restriction and increased consumption of fresh fruit, vegetables, and low fat dairy, alcohol moderation, and smoking cessation.;Monitor prescription use compliance.    Expected Outcomes Short Term: Continued assessment and intervention until BP is < 140/69mm HG in hypertensive participants. < 130/62mm HG in hypertensive participants with diabetes, heart failure or chronic kidney disease.;Long Term: Maintenance of blood pressure at goal levels.    Lipids Yes    Intervention Provide education and support for  participant on nutrition & aerobic/resistive exercise along with prescribed medications to achieve LDL 70mg , HDL >40mg .    Expected Outcomes Short Term: Participant states understanding of desired cholesterol values and is compliant with medications prescribed. Participant is following exercise prescription and nutrition guidelines.;Long Term: Cholesterol controlled with medications as prescribed, with individualized exercise RX and with personalized nutrition plan. Value goals: LDL < 70mg , HDL > 40 mg.              Personal Goals Discharge:  Goals and Risk Factor Review     Row Name 02/14/23 1416 03/01/23 1359 03/29/23 1316 04/26/23 1241 05/18/23 1643     Core Components/Risk Factors/Patient Goals Review   Personal Goals Review Weight Management/Obesity;Hypertension;Lipids Weight Management/Obesity;Hypertension;Lipids Weight Management/Obesity;Hypertension;Lipids Weight Management/Obesity;Hypertension;Lipids Weight Management/Obesity;Hypertension;Lipids   Review Shauntice started cardiac rehab on 02/14/23 and did well with exercise. Vital signs and CBg's were stable  Neta started cardiac rehab on 02/14/23 and doing  well with exercise. Vital signs have been stable Jealousy continues to  do well with exercise. Vital signs remain stable Tamelia continues to  do well with exercise. Vital signs remain stable. Lakiera has lost 1.7 kg since starting cardiac rehab. Neala continues to  do well with exercise. Vital signs remain stable. Terriah has lost 2.0 kg since starting cardiac rehab. Keniah will complete cardiac rehab on 05/27/23.   Expected Outcomes Marthena will continue to participate in cardiac rehab for exercise, nutrition and lifestyle modifications Lundyn will continue to participate in cardiac rehab for exercise, nutrition and lifestyle modifications Janell will continue to participate in cardiac rehab for exercise, nutrition and lifestyle modifications Yanna will continue to participate in cardiac  rehab for exercise, nutrition and lifestyle modifications Curtrina will continue to participate in cardiac rehab for exercise, nutrition and lifestyle modifications            Exercise Goals and Review:  Exercise Goals     Row Name 02/08/23 1208             Exercise Goals   Increase Physical Activity Yes       Intervention Provide advice, education, support and counseling about physical activity/exercise needs.;Develop an individualized exercise prescription for aerobic and resistive training based on initial evaluation findings, risk stratification, comorbidities and participant's personal goals.       Expected Outcomes Short Term: Attend rehab on a regular basis to increase amount of physical activity.;Long Term: Add in home exercise to make exercise part of routine and to increase amount of physical activity.;Long Term: Exercising regularly at least 3-5 days a week.       Increase Strength and Stamina Yes       Intervention Provide advice, education, support and counseling about physical activity/exercise needs.;Develop an individualized exercise prescription for aerobic and resistive training based on initial evaluation findings, risk stratification, comorbidities and participant's personal goals.       Expected Outcomes Short Term: Increase workloads from initial exercise prescription for resistance, speed, and METs.;Short Term: Perform resistance training exercises routinely during rehab and add in resistance training at home;Long Term: Improve cardiorespiratory fitness, muscular endurance and strength as measured by increased METs and functional capacity ( )       Able to understand and use rate of perceived exertion (RPE) scale Yes       Intervention Provide education and explanation on how to use RPE scale       Expected Outcomes Short Term: Able to use RPE daily in rehab to express subjective intensity level;Long Term:  Able to use RPE to guide intensity level when exercising  independently       Able to understand and use Dyspnea scale Yes       Intervention Provide education and explanation on how to use Dyspnea scale       Expected Outcomes Short Term: Able to use Dyspnea scale daily in rehab to express subjective sense of shortness of breath during exertion;Long Term: Able to use Dyspnea scale to guide intensity level when exercising independently       Knowledge and understanding of Target Heart Rate Range (THRR) Yes       Intervention Provide education and explanation of THRR including how the numbers were predicted and where they are located for reference       Expected Outcomes Short Term: Able to state/look up THRR;Short Term: Able to use daily as guideline for intensity in rehab;Long Term:  Able to use THRR to govern intensity when exercising independently       Understanding of Exercise Prescription Yes       Intervention Provide education, explanation, and written materials on patient's individual exercise prescription       Expected Outcomes Short Term: Able to explain program exercise prescription;Long Term: Able to explain home exercise prescription to exercise independently                Exercise Goals Re-Evaluation:  Exercise Goals Re-Evaluation     Row Name 02/14/23 1425 02/25/23 1442 03/23/23 1500 05/02/23 1500       Exercise Goal Re-Evaluation   Exercise Goals Review Increase Physical Activity;Understanding of Exercise Prescription;Increase Strength and Stamina;Knowledge and understanding of Target Heart Rate Range (THRR);Able to understand and use Dyspnea scale;Able to understand and use rate of perceived exertion (RPE) scale -- Increase Physical Activity;Understanding of Exercise Prescription;Increase Strength and Stamina;Knowledge and understanding of Target Heart Rate Range (THRR);Able to understand and use Dyspnea scale;Able to understand and use rate of perceived exertion (RPE) scale Increase Physical Activity;Understanding of Exercise  Prescription;Increase Strength and Stamina;Knowledge and understanding of Target Heart Rate Range (THRR);Able to understand and use Dyspnea scale;Able to understand and use rate of perceived exertion (RPE) scale    Comments Pt's first day in the [facility] program, Pt understnads the RPE scale, THRR and exercise Rx Reviewed METs with pt today Reviewed METs and goals with patient today. Peak METs = 4.4. Pt is making good progress. Pt is making progress on her goals of weight loss and eating healthier. Pt would like to do more exerise and we will dicusee home exercise Rx soon. Reviewed METs and goals with patient today. Peak METs = 4.4. Pt is making good progress. Pt is making progress on her goals of weight loss and eating healthier, pt lost 5 lbs since start of program. Pt is also trying to walk at home to as well; this however depends on fatigue and joint pain.    Expected Outcomes Will continue to monitor patient and progress exercise workloads as tolerated. Will continue to watch pt closely and progress as tolerated. Will continue to monitor patient and progress exercise workloads as tolerated. Will continue to monitor patient and progress exercise workloads as tolerated.             Nutrition & Weight - Outcomes:  Pre Biometrics - 02/08/23 1209       Pre Biometrics   % Body Fat 41.9 %             Post Biometrics - 05/18/23 1423        Post  Biometrics   Height 5\' 6"  (1.676 m)    Weight 77.8 kg    Waist Circumference 38 inches    Hip Circumference 45 inches    Waist to Hip Ratio 0.84 %    BMI (Calculated) 27.7    Triceps Skinfold 31 mm    % Body Fat 40.4 %    Grip Strength 30 kg    Flexibility 15.5 in    Single Leg Stand 2.53 seconds             Nutrition:  Nutrition Therapy & Goals - 04/15/23 1330       Nutrition Therapy   Diet Heart Healthy Diet    Drug/Food Interactions Statins/Certain Fruits      Personal Nutrition Goals   Nutrition Goal Patient to identify  strategies for reducing cardiovascular risk by attending the [facility] education  and nutrition series weekly.   goal in action.   Personal Goal #2 Patient to improve diet quality by using the plate method as a guide for meal planning to include lean protein/plant protein, fruits, vegetables, whole grains, nonfat dairy as part of a well-balanced diet.   goal in action.   Personal Goal #3 Patient to reduce sodium to 1500mg  per day.   goal in progress.   Comments Goals in progress. Ken reports motivation to improve eating habits. She continues to attend to the [facility] education and nutrition series regularly. She has medical history of CAD, CHF, HTN, COPD. She is down 4.4# since starting with our program. Sheh as started making many dietary changes including increased dietary fiber and reduced saturated fat. She continues to work on mindful eating habits and reducing snacking on refined carbohydrates. LDL remains well controlled <70 (57). Patient will benefit from participation in intensive cardiac rehab for nutrition, exercise, and lifestyle modification.      Intervention Plan   Intervention Prescribe, educate and counsel regarding individualized specific dietary modifications aiming towards targeted core components such as weight, hypertension, lipid management, diabetes, heart failure and other comorbidities.;Nutrition handout(s) given to patient.    Expected Outcomes Short Term Goal: Understand basic principles of dietary content, such as calories, fat, sodium, cholesterol and nutrients.;Long Term Goal: Adherence to prescribed nutrition plan.             Nutrition Discharge:  Nutrition Assessments - 02/15/23 1151       Rate Your Plate Scores   Pre Score 45             Education Questionnaire Score:  Knowledge Questionnaire Score - 05/16/23 1500       Knowledge Questionnaire Score   Post Score 23/24             Goals reviewed with patient; copy given to patient.Pt  graduates from  Intensive/Traditional cardiac rehab program on 05/25/23  with completion of    ** exercise and education sessions. Pt maintained good attendance and progressed nicely during their participation in rehab as evidenced by increased MET level. Zarina increased her distance on her post exercise walk test by 633 feet.   Medication list reconciled. Repeat  PHQ score-  .  Pt has made significant lifestyle changes and should be commended for their success. Marlynn achieved their goals during cardiac rehab.   Pt plans to continue exercise walking at home and doing chair yoga. We are proud of Alissia's progress.Thayer Headings RN BSN

## 2023-05-27 ENCOUNTER — Encounter (HOSPITAL_COMMUNITY): Payer: Medicare Other

## 2023-06-09 DIAGNOSIS — Z124 Encounter for screening for malignant neoplasm of cervix: Secondary | ICD-10-CM | POA: Diagnosis not present

## 2023-06-09 DIAGNOSIS — Z780 Asymptomatic menopausal state: Secondary | ICD-10-CM | POA: Diagnosis not present

## 2023-06-09 DIAGNOSIS — Q5128 Other doubling of uterus, other specified: Secondary | ICD-10-CM | POA: Diagnosis not present

## 2023-06-09 DIAGNOSIS — Z01411 Encounter for gynecological examination (general) (routine) with abnormal findings: Secondary | ICD-10-CM | POA: Diagnosis not present

## 2023-06-09 DIAGNOSIS — Z1331 Encounter for screening for depression: Secondary | ICD-10-CM | POA: Diagnosis not present

## 2023-06-09 DIAGNOSIS — Z01419 Encounter for gynecological examination (general) (routine) without abnormal findings: Secondary | ICD-10-CM | POA: Diagnosis not present

## 2023-06-21 ENCOUNTER — Ambulatory Visit: Payer: Medicare Other | Admitting: Cardiology

## 2023-06-23 ENCOUNTER — Ambulatory Visit (HOSPITAL_COMMUNITY): Payer: Medicare Other | Attending: Nurse Practitioner

## 2023-06-23 DIAGNOSIS — J449 Chronic obstructive pulmonary disease, unspecified: Secondary | ICD-10-CM | POA: Diagnosis not present

## 2023-06-23 DIAGNOSIS — I5042 Chronic combined systolic (congestive) and diastolic (congestive) heart failure: Secondary | ICD-10-CM | POA: Diagnosis not present

## 2023-06-23 DIAGNOSIS — I10 Essential (primary) hypertension: Secondary | ICD-10-CM | POA: Diagnosis not present

## 2023-06-23 DIAGNOSIS — I2510 Atherosclerotic heart disease of native coronary artery without angina pectoris: Secondary | ICD-10-CM | POA: Diagnosis not present

## 2023-06-23 LAB — ECHOCARDIOGRAM COMPLETE
Area-P 1/2: 3.97 cm2
MV M vel: 5.32 m/s
MV Peak grad: 113.2 mm[Hg]
Radius: 0.6 cm
S' Lateral: 3.5 cm

## 2023-07-25 ENCOUNTER — Ambulatory Visit: Payer: Medicare Other | Attending: Cardiology | Admitting: Cardiology

## 2023-07-25 VITALS — BP 120/62 | HR 55 | Ht 66.0 in | Wt 171.2 lb

## 2023-07-25 DIAGNOSIS — I5042 Chronic combined systolic (congestive) and diastolic (congestive) heart failure: Secondary | ICD-10-CM | POA: Insufficient documentation

## 2023-07-25 DIAGNOSIS — I2510 Atherosclerotic heart disease of native coronary artery without angina pectoris: Secondary | ICD-10-CM | POA: Insufficient documentation

## 2023-07-25 DIAGNOSIS — I10 Essential (primary) hypertension: Secondary | ICD-10-CM | POA: Insufficient documentation

## 2023-07-25 NOTE — Patient Instructions (Addendum)
Medication Instructions:  Please discontinue your Clopidogrel. Continue all other medications as listed.  *If you need a refill on your cardiac medications before your next appointment, please call your pharmacy*  Follow-Up: At [facility], you and your health needs are our priority.  As part of our continuing mission to provide you with exceptional heart care, we have created designated Provider Care Teams.  These Care Teams include your primary Cardiologist (physician) and Advanced Practice Providers (APPs -  Physician Assistants and Nurse Practitioners) who all work together to provide you with the care you need, when you need it.  We recommend signing up for the patient portal called "MyChart".  Sign up information is provided on this After Visit Summary.  MyChart is used to connect with patients for Virtual Visits (Telemedicine).  Patients are able to view lab/test results, encounter notes, upcoming appointments, etc.  Non-urgent messages can be sent to your provider as well.   To learn more about what you can do with MyChart, go to [email or web address].    Your next appointment:   1 year(s)  Provider:   Dr Donato Schultz       1st Floor: - Lobby - Registration  - Pharmacy  - Lab - Cafe  2nd Floor: - PV Lab - Diagnostic Testing (echo, CT, nuclear med)  3rd Floor: - Vacant  4th Floor: - TCTS (cardiothoracic surgery) - AFib Clinic - Structural Heart Clinic - Vascular Surgery  - Vascular Ultrasound  5th Floor: - [facility] Cardiology (general and EP) - Clinical Pharmacy for coumadin, hypertension, lipid, weight-loss medications, and med management appointments    Valet parking services will be available as well.

## 2023-07-25 NOTE — Progress Notes (Signed)
Cardiology Office Note:  .   Date:  07/25/2023  ID:  [name] [name], DOB May 08, 1956, MRN [id number] PCP: Alysia Penna, MD  [facility] Providers Cardiologist:  Donato Schultz, MD     History of Present Illness: .   [name] [name] is a 68 y.o. female Discussed the use of AI scribe   History of Present Illness   [name] [name] is a 68 year old female with coronary artery disease who presents for follow-up.  She underwent cardiac catheterization on January 26, 2023, with stent placement in the proximal right coronary artery due to a 75% stenosis. Post-procedure, she feels significantly better, stating, 'I should have had this done before.'  Her ejection fraction was noted to be 50-55% on the left ventriculogram during the catheterization, and her pulmonary artery pressures indicated mild pulmonary hypertension. An echocardiogram on June 26, 2022, showed an ejection fraction of 40-45%, which is mildly decreased. Her blood pressure is under excellent control, and she is tolerating the maximum dose of Entresto better now after an initial period of fatigue.  She has a history of COPD, which is currently managed by pulmonology. She has had non-small cell lung cancer stage IIIA, status post partial pneumonectomy and radiation. She feels better post-stent and has been able to perform activities such as climbing stairs without shortness of breath. She uses a rescue inhaler rarely.  Her last creatinine was 0.87 with a potassium level of 4.7. She is on Crestor 20 mg, which was increased post-stent, and her LDL is 57.  She has been participating in cardiac rehab and has been walking regularly, although recent weather has limited her outdoor activity.          ROS: No CP no SOB  Studies Reviewed: .        Results   LABS Creatinine: 0.87 Potassium: 4.7 LDL: 57  DIAGNOSTIC Cardiac catheterization: Stent placement to proximal RCA lesion with 75% stenosis, EF  50-55%, PA pressures compatible with mild pulmonary hypertension (01/26/2023) Echocardiogram: EF 45-50% (06/2023)     Risk Assessment/Calculations:            Physical Exam:   VS:  BP 120/62   Pulse (!) 55   Ht 5\' 6"  (1.676 m)   Wt 171 lb 3.2 oz (77.7 kg)   SpO2 99%   BMI 27.63 kg/m    Wt Readings from Last 3 Encounters:  07/25/23 171 lb 3.2 oz (77.7 kg)  05/18/23 171 lb 8.3 oz (77.8 kg)  03/15/23 174 lb 3.2 oz (79 kg)    GEN: Well nourished, well developed in no acute distress NECK: No JVD; No carotid bruits CARDIAC: RRR, no murmurs, no rubs, no gallops RESPIRATORY:  Clear to auscultation without rales, wheezing or rhonchi  ABDOMEN: Soft, non-tender, non-distended EXTREMITIES:  No edema; No deformity   ASSESSMENT AND PLAN: .    Assessment and Plan    Coronary Artery Disease (CAD) Follow-up post-stent placement in the proximal RCA on 01/26/2023 for 75% stenosis. Significant symptom improvement reported. EF was 50-55% on left ventriculogram and 45-50% on echocardiogram from 06/2022, indicating mild systolic dysfunction. Blood pressure is well-controlled. Continued on DAPT with aspirin and Plavix for six months post-stent. Discussed transitioning to aspirin 81 mg daily due to bruising tendency. Both medications are effective in preventing future cardiac events, with aspirin being a common choice. 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 On maximum dose of Entresto and low dose of spironolactone. Reports improved tolerance to Entresto and reduced fatigue. Discussed the importance of these medications in managing heart failure and preventing further decline in heart function. - Continue Entresto 97/103 mg daily - Continue spironolactone 12.5 mg daily  Chronic Obstructive Pulmonary Disease (COPD) Managed by  pulmonology. Reports significant improvement in symptoms post-stent placement. Currently not on Stiolto and starting cardiac rehab. Further management to be discussed with pulmonology. - Follow up with pulmonology as scheduled  Non-Small Cell Lung Cancer (NSCLC) Stage IIIA Status post partial pneumonectomy and radiation. Followed by oncology with annual visits. Last visit was uneventful. Discussed the importance of continued surveillance to monitor for recurrence. - Continue annual follow-up with oncology  General Health Maintenance Up-to-date with vaccinations and screenings. LDL at 57. On Crestor 20 mg daily. Discussed the importance of maintaining LDL levels to prevent further cardiovascular events. - Continue Crestor 20 mg daily  Follow-up - Schedule follow-up appointment in one year.               Signed, Donato Schultz, MD

## 2023-07-29 ENCOUNTER — Ambulatory Visit: Payer: Medicare Other | Admitting: Emergency Medicine

## 2023-07-29 ENCOUNTER — Encounter: Payer: Self-pay | Admitting: Emergency Medicine

## 2023-07-29 VITALS — BP 114/73 | HR 65 | Ht 66.0 in | Wt 168.4 lb

## 2023-07-29 DIAGNOSIS — C3431 Malignant neoplasm of lower lobe, right bronchus or lung: Secondary | ICD-10-CM

## 2023-07-29 DIAGNOSIS — J449 Chronic obstructive pulmonary disease, unspecified: Secondary | ICD-10-CM | POA: Diagnosis not present

## 2023-07-29 NOTE — Assessment & Plan Note (Signed)
Her dyspnea and functional capacity are somewhat better after her PTCI and cardiac rehab.  She never tried the [employer].  Did discuss with her the possibility that there is some coexisting lung disease.  She would need to try the Stiolto to see if it is worth treating.  She will consider a trial and if she benefits then we will plan to continue it longer term.

## 2023-07-29 NOTE — Assessment & Plan Note (Signed)
Following her CT chest in September per Dr. Asa Lente plans.

## 2023-07-29 NOTE — Progress Notes (Signed)
Subjective:    Patient ID: [name] [name], female    DOB: January 02, 1956, 68 y.o.   MRN: [id number]  COPD She complains of cough and shortness of breath. There is no wheezing. Pertinent negatives include no ear pain, fever, headaches, postnasal drip, rhinorrhea, sneezing, sore throat or trouble swallowing. Her past medical history is significant for COPD.   ROV 01/19/2023 --follow-up visit for 68 year old woman with a history of a right lower lobe lobectomy and neoadjuvant chemo for adenocarcinoma.  Also with COPD, CAD, hypertension with systolic and diastolic dysfunction (EF 45%).  She reports more exertional shortness of breath since our last visit. She has had some interval wt gain, has not really worked any on her conditioning.  Not currently on scheduled bronchodilator therapy.  Uses albuterol occasionally. She is due for repeat CT scan of the chest in September. Since last time she was dx w mild OSA, CPAP was deferred.   ROV 07/29/2023 --follow-up visit for pleasant 68 year old woman with a history of COPD and a right lower lobe lobectomy + neoadjuvant chemo for adenocarcinoma.  She has very mild OSA, not on CPAP.  She also follows with Dr. Anne Fu for CAD, hypertension and systolic/diastolic dysfunction.  I saw her in August at which time she was having progressive dyspnea with exertion.  We talked about a re-trial of Stiolto.  Since I saw her then she has been evaluated by cardiology and underwent PTCI.  She reports soon after I saw her in August > had a stent placed. Her breathing has improved. She will still have some SOB w exertion, but is able to go upstairs now. No wheeze. She also did cardiac rehab - also seemed to help her exercise tolerance some. She sometimes pre-treated her exercise w SABA, but didn't see a big difference. No desats seen at cardiac rehab w exercise.   CT chest 02/15/23 (MM) > no evidence of recurrence or new findings.  Review of Systems  Constitutional:  Negative for  fever and unexpected weight change.  HENT:  Negative for congestion, dental problem, ear pain, nosebleeds, postnasal drip, rhinorrhea, sinus pressure, sneezing, sore throat and trouble swallowing.   Eyes:  Negative for redness and itching.  Respiratory:  Positive for cough and shortness of breath. Negative for chest tightness and wheezing.   Cardiovascular:  Negative for palpitations and leg swelling.  Gastrointestinal:  Negative for nausea and vomiting.  Genitourinary:  Negative for dysuria.  Musculoskeletal:  Negative for joint swelling.  Skin:  Negative for rash.  Neurological:  Negative for headaches.  Hematological:  Does not bruise/bleed easily.  Psychiatric/Behavioral:  Negative for dysphoric mood. The patient is not nervous/anxious.        Objective:   Physical Exam Vitals:   07/29/23 1127  BP: 114/73  Pulse: 65  SpO2: 96%  Weight: 168 lb 6.4 oz (76.4 kg)  Height: 5\' 6"  (1.676 m)    Gen: Pleasant, well-nourished, in no distress,  normal affect  ENT: No lesions,  mouth clear,  oropharynx clear, no postnasal drip, no stridor  Lungs: No use of accessory muscles, no crackles, no active wheezing  Cardiovascular: RRR, heart sounds normal, no murmur or gallops, no peripheral edema  Musculoskeletal: No deformities, no cyanosis or clubbing  Neuro: alert, non focal  Skin: Warm, no lesions or rashes      Assessment & Plan:  COPD GOLD II B  Her dyspnea and functional capacity are somewhat better after her PTCI and cardiac rehab.  She never tried the  Stiolto.  Did discuss with her the possibility that there is some coexisting lung disease.  She would need to try the Stiolto to see if it is worth treating.  She will consider a trial and if she benefits then we will plan to continue it longer term.  Primary cancer of right lower lobe of lung Following her CT chest in September per Dr. Asa Lente plans.    Levy Pupa, MD, PhD 07/29/2023, 11:44 AM [facility] [phone] or if no answer [phone]

## 2023-07-29 NOTE — Patient Instructions (Addendum)
Get your repeat CT scan of the chest in September 2025 with Dr. Arbutus Ped as planned Consider doing a trial of Stiolto 2 puffs once daily.  Take this for about 2-3 weeks to see if it helps your breathing and your functional capacity.  If so then please let us know and we will plan to continue it Keep albuterol available to use 2 puffs if needed for shortness of breath, chest tightness, wheezing.  You can also try using 2 puffs about 10-15 minutes prior to exercise. Follow with Dr. Arbutus Ped and Dr. Anne Fu as planned Follow with Dr. Delton Coombes in 12 months or sooner if you have any problems.

## 2023-08-15 DIAGNOSIS — M25571 Pain in right ankle and joints of right foot: Secondary | ICD-10-CM | POA: Diagnosis not present

## 2023-08-30 DIAGNOSIS — M722 Plantar fascial fibromatosis: Secondary | ICD-10-CM | POA: Diagnosis not present

## 2023-08-30 DIAGNOSIS — S93492A Sprain of other ligament of left ankle, initial encounter: Secondary | ICD-10-CM | POA: Diagnosis not present

## 2023-08-30 DIAGNOSIS — R29898 Other symptoms and signs involving the musculoskeletal system: Secondary | ICD-10-CM | POA: Diagnosis not present

## 2023-09-06 ENCOUNTER — Encounter: Payer: Self-pay | Admitting: Cardiology

## 2023-09-09 ENCOUNTER — Other Ambulatory Visit: Payer: Self-pay | Admitting: Nurse Practitioner

## 2023-09-13 ENCOUNTER — Telehealth: Payer: Self-pay

## 2023-09-13 DIAGNOSIS — Z1211 Encounter for screening for malignant neoplasm of colon: Secondary | ICD-10-CM | POA: Diagnosis not present

## 2023-09-13 DIAGNOSIS — G7249 Other inflammatory and immune myopathies, not elsewhere classified: Secondary | ICD-10-CM | POA: Diagnosis not present

## 2023-09-13 DIAGNOSIS — Z7902 Long term (current) use of antithrombotics/antiplatelets: Secondary | ICD-10-CM | POA: Diagnosis not present

## 2023-09-13 DIAGNOSIS — I2510 Atherosclerotic heart disease of native coronary artery without angina pectoris: Secondary | ICD-10-CM | POA: Diagnosis not present

## 2023-09-13 NOTE — Telephone Encounter (Signed)
   Pre-operative Risk Assessment    Patient Name: [name] [name]  DOB: January 25, 1956 MRN: [id number]   Date of last office visit: 07/25/23 Donato Schultz, MD Date of next office visit: NONE   Request for Surgical Clearance    Procedure:   COLONOSCOPY  Date of Surgery:  Clearance TBD (JUNE 2025)                                Surgeon:  DR Willis Modena Surgeon's Group or Practice Name:  [facility] Phone number:  [phone] Fax number:  [phone]   Type of Clearance Requested:   - Medical  - Pharmacy:  Hold Clopidogrel (Plavix) 5 DAYS PRIOR AND ASPIRIN   Type of Anesthesia:   PROPOFOL   Additional requests/questions:    Signed, Marlow Baars   09/13/2023, 6:01 PM

## 2023-09-14 NOTE — Telephone Encounter (Signed)
     Primary Cardiologist: Donato Schultz, MD  Chart reviewed as part of pre-operative protocol coverage. Given past medical history and time since last visit, based on ACC/AHA guidelines, Avital Dancy would be at acceptable risk for the planned procedure without further cardiovascular testing.   Patient should no longer be taking Plavix.  Her aspirin should be continued throughout the perioperative period.  I will route this recommendation to the requesting party via Epic fax function and remove from pre-op pool.  Please call with questions.  [name] [name]. Roben Tatsch NP-C     09/14/2023, 7:51 AM [facility] [address] Office [phone] Fax [phone]

## 2023-09-16 DIAGNOSIS — M722 Plantar fascial fibromatosis: Secondary | ICD-10-CM | POA: Diagnosis not present

## 2023-09-21 DIAGNOSIS — M722 Plantar fascial fibromatosis: Secondary | ICD-10-CM | POA: Diagnosis not present

## 2023-09-28 DIAGNOSIS — M722 Plantar fascial fibromatosis: Secondary | ICD-10-CM | POA: Diagnosis not present

## 2023-10-03 DIAGNOSIS — M722 Plantar fascial fibromatosis: Secondary | ICD-10-CM | POA: Diagnosis not present

## 2023-10-04 DIAGNOSIS — M722 Plantar fascial fibromatosis: Secondary | ICD-10-CM | POA: Diagnosis not present

## 2023-10-04 DIAGNOSIS — S93492A Sprain of other ligament of left ankle, initial encounter: Secondary | ICD-10-CM | POA: Diagnosis not present

## 2023-10-10 DIAGNOSIS — M722 Plantar fascial fibromatosis: Secondary | ICD-10-CM | POA: Diagnosis not present

## 2023-10-10 DIAGNOSIS — S93492A Sprain of other ligament of left ankle, initial encounter: Secondary | ICD-10-CM | POA: Diagnosis not present

## 2023-10-12 ENCOUNTER — Ambulatory Visit (INDEPENDENT_AMBULATORY_CARE_PROVIDER_SITE_OTHER): Admitting: Adult Health

## 2023-10-12 ENCOUNTER — Ambulatory Visit: Payer: Medicare Other | Admitting: Adult Health

## 2023-10-12 ENCOUNTER — Encounter: Payer: Self-pay | Admitting: Adult Health

## 2023-10-12 VITALS — BP 129/69 | HR 87 | Ht 65.0 in | Wt 169.0 lb

## 2023-10-12 DIAGNOSIS — R4181 Age-related cognitive decline: Secondary | ICD-10-CM

## 2023-10-12 DIAGNOSIS — G4700 Insomnia, unspecified: Secondary | ICD-10-CM | POA: Diagnosis not present

## 2023-10-12 DIAGNOSIS — S93492A Sprain of other ligament of left ankle, initial encounter: Secondary | ICD-10-CM | POA: Diagnosis not present

## 2023-10-12 DIAGNOSIS — M722 Plantar fascial fibromatosis: Secondary | ICD-10-CM | POA: Diagnosis not present

## 2023-10-12 MED ORDER — BELSOMRA 10 MG PO TABS: 10.0000 mg | ORAL_TABLET | Freq: Every evening | ORAL | 0 refills | Status: AC | PRN

## 2023-10-12 NOTE — Progress Notes (Signed)
 [facility] [address] [place]. [place] [address] [phone]       OFFICE FOLLOW UP NOTE  [name] Date of Birth:  1955-09-22 Medical Record Number:  [id number]    Primary neurologist: Dr. Albertina Hugger Reason for visit: Memory complaints    SUBJECTIVE:  CHIEF COMPLAINT:  Chief Complaint  Patient presents with   Memory Loss    Rm 3 alone Pt is well and stable. Reports memory is the same as last visit.    Follow-up visit:  Prior visit: 10/11/2022 with Dr. Albertina Hugger  Brief HPI:   [name] [name] is a 68 y.o. female who is followed for MCI.  She has previously completed neuropsychological evaluation in 07/2022 who suspected complaints more age-related and age-appropriate changes based on testing his memory performance is overall excellent, no evidence of Alzheimer's disease or any other form of neurodegenerative illness presently.  She had previously underwent sleep study which showed very mild sleep apnea with use of CPAP voluntary.  At prior visit with Dr. Albertina Hugger, MOCA 27/30 and MMSE 30/30.  Noted chronic insomnia and recommended use of Belsomra  as needed.    Interval history:  Returns today for yearly follow-up visit. Reports memory stable since prior visit. MMSE today 28/30 (missed day of the week, said it was Tuesday but it is Wednesday, and missed 1 calculation question).  She continues to maintain ADLs and IADLs independently as well as driving.  She continues to struggle with remembering appointments, at times will forget the directions to a familiar place but will eventually come to her, sometimes she will miss her exit or turn while driving, she can forget if she locked her car and will have to go back to ensure it is locked, she needs to park in the same place when going to the store or she will have a hard time remembering where she parked.  These are all unchanged over the past year.  She continues to have some difficulties with  insomnia, was prescribed Belsomra  at prior visit but has not yet tried. She is requesting a refill just to have in case it is needed (prior fill 1 year ago for 30 day supply).  She does question need of repeat MRI brain as she reports this was previously discussed by Dr. Albertina Hugger for surveillance monitoring as she does have history of lung cancer.  She denies any new headaches, vision changes, one-sided weakness/numbness/tingling, speech changes or gait/balance difficulties.       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 she was given albuterol  inhaler & had steroid with surg.     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, transient, takes Verapimil  for migraine & BP control    Mild obstructive sleep apnea 07/28/2021   Pain of right thumb 06/24/2017   Plantar fasciitis of left foot 03/12/2022   PONV (postoperative nausea and vomiting)    Primary cancer of right lower lobe of lung 04/17/2014   Shortness of breath 09/17/2019   Sprain of lateral ligament of ankle joint 09/11/2021   Subjective memory complaints 07/16/2022   Tenosynovitis of both hands 12/04/2021   Thyroid  nodule    benign by biopsy   Tightness of left gastrocnemius muscle  03/12/2022   Transient visual loss 01/25/2012   Varicose veins    left leg   Vitamin D  deficiency    Wears glasses     PSH:  Past Surgical History:  Procedure Laterality Date   CARPAL TUNNEL RELEASE Left    CESAREAN SECTION     COLONOSCOPY W/ BIOPSIES AND POLYPECTOMY     CORONARY STENT INTERVENTION N/A 01/26/2023   Procedure: CORONARY STENT INTERVENTION;  Surgeon: Lucendia Rusk, MD;  Location: [facility] INVASIVE CV LAB;  Service: Cardiovascular;  Laterality: N/A;   DILATION AND CURETTAGE OF UTERUS     RIGHT/LEFT HEART CATH AND CORONARY ANGIOGRAPHY N/A 01/26/2023   Procedure: RIGHT/LEFT HEART CATH AND CORONARY ANGIOGRAPHY;  Surgeon: Lucendia Rusk, MD;  Location: [facility] INVASIVE CV LAB;  Service: Cardiovascular;  Laterality: N/A;   TONSILLECTOMY     VARICOSE VEIN SURGERY     left   VIDEO ASSISTED THORACOSCOPY (VATS)/ LOBECTOMY Right 07/23/2014   Procedure: VIDEO ASSISTED THORACOSCOPY (VATS)/ LOBECTOMY;  Surgeon: Heriberto London, MD;  Location: [facility] OR;  Service: Thoracic;  Laterality: Right;   VIDEO BRONCHOSCOPY N/A 07/23/2014   Procedure: VIDEO BRONCHOSCOPY;  Surgeon: Heriberto London, MD;  Location: [facility] OR;  Service: Thoracic;  Laterality: N/A;   VIDEO BRONCHOSCOPY WITH ENDOBRONCHIAL ULTRASOUND Right 04/17/2014   Procedure: VIDEO BRONCHOSCOPY WITH ENDOBRONCHIAL ULTRASOUND;  Surgeon: Denson Flake, MD;  Location: [facility] OR;  Service: Thoracic;  Laterality: Right;   VIDEO BRONCHOSCOPY WITH ENDOBRONCHIAL ULTRASOUND N/A 07/08/2014   Procedure: VIDEO BRONCHOSCOPY WITH ENDOBRONCHIAL ULTRASOUND;  Surgeon: Heriberto London, MD;  Location: [facility] OR;  Service: Thoracic;  Laterality: N/A;    Social History:  Social History   Socioeconomic History   Marital status: Widowed    Spouse name: Not on file   Number of children: 1   Years of education: 15   Highest education level: Associate degree: academic program  Occupational History   Occupation: Retired    Comment: Psychologist, forensic  Tobacco  Use   Smoking status: Former    Current packs/day: 0.00    Average packs/day: 0.5 packs/day for 40.0 years (20.0 ttl pk-yrs)    Types: Cigarettes    Start date: 03/17/1973    Quit date: 03/17/2013    Years since quitting: 10.5   Smokeless tobacco: Never  Vaping Use   Vaping status: Never Used  Substance and Sexual Activity   Alcohol  use: Not Currently    Comment: " rare"   Drug use: No   Sexual activity: Not on file  Other Topics Concern   Not on file  Social History Narrative   Lives w daughter   Caffeine use: 1-2 cups coffee per day   Right handed    Social Drivers of Health   Financial Resource Strain: Not on file  Food Insecurity: Not on file  Transportation Needs: Not on file  Physical Activity: Not on file  Stress: Not on file  Social  Connections: Not on file  Intimate Partner Violence: Not on file    Family History:  Family History  Problem Relation Age of Onset   Cancer Mother        cancer of the small intestines   Hypertension Father    Atrial fibrillation Father    Diverticulitis Father    Hypertension Sister    Hypertension Sister    Headache Sister    Hypertension Brother    Diverticulitis Brother     Medications:   Current Outpatient Medications on File Prior to Visit  Medication Sig Dispense Refill   aspirin  EC 81 MG tablet Take 1 tablet (81 mg total) by mouth daily. Swallow whole. 30 tablet 12   clopidogrel  (PLAVIX ) 75 MG tablet Take 1 tablet (75 mg total) by mouth daily. 90 tablet 3   loratadine  (CLARITIN ) 10 MG tablet Take 10 mg by mouth daily as needed for rhinitis or allergies.     rosuvastatin  (CRESTOR ) 20 MG tablet Take 1 tablet (20 mg total) by mouth daily. 90 tablet 3   sacubitril -valsartan  (ENTRESTO ) 97-103 MG TAKE 1 TABLET BY MOUTH 2 TIMES A DAY 60 tablet 11   spironolactone  (ALDACTONE ) 25 MG tablet Take 0.5 tablets (12.5 mg total) by mouth daily. 45 tablet 3   VENTOLIN  HFA 108 (90 Base) MCG/ACT inhaler INHALE 1 PUFF BY MOUTH THREE  TIMES DAILY AS NEEDED FOR WHEEZING FOR SHORTNESS OF BREATH 18 g 0   Vitamin D , Ergocalciferol , (DRISDOL) 50000 units CAPS capsule Take 50,000 Units by mouth every 7 (seven) days.     nitroGLYCERIN  (NITROSTAT ) 0.4 MG SL tablet Place 1 tablet (0.4 mg total) under the tongue every 5 (five) minutes as needed for chest pain. 25 tablet 6   Suvorexant  (BELSOMRA ) 10 MG TABS Take 1 tablet (10 mg total) by mouth at bedtime as needed. (Patient not taking: Reported on 10/12/2023) 30 tablet 2   Tiotropium Bromide -Olodaterol (STIOLTO RESPIMAT ) 2.5-2.5 MCG/ACT AERS Inhale 2 puffs into the lungs daily. (Patient not taking: Reported on 10/12/2023)     No current facility-administered medications on file prior to visit.    Allergies:   Allergies  Allergen Reactions   Advil [Ibuprofen] Other (See Comments)    Causes hematuria    Amoxicillin -Pot Clavulanate Nausea And Vomiting and Other (See Comments)    Severe dehydration   Carvedilol      SOB   Codeine Hives   Hydrocodone -Guaifenesin Itching   Sulfa Antibiotics Hives and Swelling   Temazepam  Other (See Comments)    dizziness   Plasticized Base [Plastibase] Rash    tape   Zofran  [Ondansetron  Hcl] Palpitations      OBJECTIVE:  Physical Exam  Vitals:   10/12/23 1317  BP: 129/69  Pulse: 87  Weight: 169 lb (76.7 kg)  Height: 5\' 5"  (1.651 m)   Body mass index is 28.12 kg/m. No results found.  General: well developed, well nourished, very pleasant elderly Caucasian female, seated, in no evident distress  Neurologic Exam Mental Status: Awake and fully alert. Oriented to place and time. Recent memory subjectively impaired and remote memory intact. Attention span, concentration and fund of knowledge appropriate. Mood and affect appropriate.  Cranial Nerves: Pupils equal, briskly reactive to light. Extraocular movements full without nystagmus. Visual fields full to confrontation. Hearing intact. Facial sensation intact. Face, tongue, palate moves  normally and symmetrically.  Motor: Normal bulk and tone. Normal strength in all tested extremity muscles Sensory.: intact to touch , pinprick , position and vibratory sensation.  Coordination:  Rapid alternating movements normal in all extremities. Finger-to-nose and heel-to-shin performed accurately bilaterally. Gait and Station: Arises from chair without difficulty. Stance is normal. Gait demonstrates normal stride length and balance without use of AD.  Reflexes: 1+ and symmetric. Toes downgoing.      10/12/2023    1:19 PM 10/11/2022    1:20 PM 01/21/2021    9:47 AM  MMSE - Mini Mental State Exam  Orientation to time 4 5 5   Orientation to Place 5 5 5   Registration 3 3 3   Attention/ Calculation 4 5 5   Recall 3 3 3   Language- name 2 objects 2 2 2   Language- repeat 1 1 1   Language- follow 3 step command 3 3 3   Language- read & follow direction 1 1 1   Write a sentence 1 1 1   Copy design 1 1 0  Total score 28 30 29          ASSESSMENT/PLAN: [name] [name] is a 68 y.o. year old female      MCI:  MMSE today 28/30 (prior 27/30) Denies any progression over the past year.  Most of her concerns are fairly consistent with age-related memory loss discussed continued compensation strategies Per neuropsych evaluation 07/2022, testing age-appropriate and not suggestive of underlying Alzheimer's dementia or any other neurodegenerative process  Insomnia: Use of Belsomra  at night as needed - has not yet tried, advised can be used on a PRN basis - refill provided per patient request Prior sleep study very mild sleep apnea not needing treatment Intolerant of trazodone   Hx of lung cancer Per patient, Dr. Albertina Hugger planned on repeat MR brain for surveillance monitoring for metastasis due to history of lung cancer. Will discuss if this is needed with Dr. Albertina Hugger.  Neuroexam intact and is not currently experiencing any new neurological symptoms Routinely follows with  oncology      Follow up in 1 year or call earlier if needed   CC:  PCP: Barnetta Liberty, MD    I spent 25 minutes of face-to-face and non-face-to-face time with patient.  This included previsit chart review, lab review, study review, order entry, electronic health record documentation, patient education and discussion regarding above diagnoses and treatment plan and answered all other questions to patient's satisfaction  Johny Nap, [facility]  [facility] [address] [place], [place] [address]  Phone [phone] Fax [phone] Note: This document was prepared with digital dictation and possible smart phrase technology. Any transcriptional errors that result from this process are unintentional.

## 2023-10-12 NOTE — Patient Instructions (Addendum)
 Your Plan:  Start Belsomra  to use as needed at night for insomnia  Continue to monitor your memory, if this should significantly worsen please let me know   Will follow up with Dr. Albertina Hugger regarding need of MRI brain - I will keep you updated regarding this     Follow up in 1 year or call earlier if needed     Thank you for coming to see us  at [facility]. I hope we have been able to provide you high quality care today.  You may receive a patient satisfaction survey over the next few weeks. We would appreciate your feedback and comments so that we may continue to improve ourselves and the health of our patients.

## 2023-10-17 NOTE — Progress Notes (Signed)
 Patient was under the impression that you had previously discussed routine MRI brain more so for surveillance monitoring but I did discuss as she has not had any new neurological symptoms, we typically do not routinely obtain MRI brain imaging. I just wanted to confirm this with you. Thank you.

## 2023-10-19 DIAGNOSIS — S93492A Sprain of other ligament of left ankle, initial encounter: Secondary | ICD-10-CM | POA: Diagnosis not present

## 2023-10-19 DIAGNOSIS — M722 Plantar fascial fibromatosis: Secondary | ICD-10-CM | POA: Diagnosis not present

## 2023-10-24 DIAGNOSIS — S93492A Sprain of other ligament of left ankle, initial encounter: Secondary | ICD-10-CM | POA: Diagnosis not present

## 2023-10-24 DIAGNOSIS — M722 Plantar fascial fibromatosis: Secondary | ICD-10-CM | POA: Diagnosis not present

## 2023-11-09 DIAGNOSIS — S93492A Sprain of other ligament of left ankle, initial encounter: Secondary | ICD-10-CM | POA: Diagnosis not present

## 2023-11-09 DIAGNOSIS — M722 Plantar fascial fibromatosis: Secondary | ICD-10-CM | POA: Diagnosis not present

## 2023-11-16 DIAGNOSIS — M722 Plantar fascial fibromatosis: Secondary | ICD-10-CM | POA: Diagnosis not present

## 2023-11-16 DIAGNOSIS — S93492A Sprain of other ligament of left ankle, initial encounter: Secondary | ICD-10-CM | POA: Diagnosis not present

## 2023-12-05 DIAGNOSIS — Z1211 Encounter for screening for malignant neoplasm of colon: Secondary | ICD-10-CM | POA: Diagnosis not present

## 2023-12-05 DIAGNOSIS — K5730 Diverticulosis of large intestine without perforation or abscess without bleeding: Secondary | ICD-10-CM | POA: Diagnosis not present

## 2023-12-05 DIAGNOSIS — K648 Other hemorrhoids: Secondary | ICD-10-CM | POA: Diagnosis not present

## 2023-12-07 DIAGNOSIS — S93492A Sprain of other ligament of left ankle, initial encounter: Secondary | ICD-10-CM | POA: Diagnosis not present

## 2023-12-07 DIAGNOSIS — M722 Plantar fascial fibromatosis: Secondary | ICD-10-CM | POA: Diagnosis not present

## 2023-12-12 DIAGNOSIS — M722 Plantar fascial fibromatosis: Secondary | ICD-10-CM | POA: Diagnosis not present

## 2023-12-12 DIAGNOSIS — S93492A Sprain of other ligament of left ankle, initial encounter: Secondary | ICD-10-CM | POA: Diagnosis not present

## 2023-12-20 DIAGNOSIS — M722 Plantar fascial fibromatosis: Secondary | ICD-10-CM | POA: Diagnosis not present

## 2023-12-20 DIAGNOSIS — S93492A Sprain of other ligament of left ankle, initial encounter: Secondary | ICD-10-CM | POA: Diagnosis not present

## 2024-01-23 DIAGNOSIS — M8589 Other specified disorders of bone density and structure, multiple sites: Secondary | ICD-10-CM | POA: Diagnosis not present

## 2024-01-23 DIAGNOSIS — E559 Vitamin D deficiency, unspecified: Secondary | ICD-10-CM | POA: Diagnosis not present

## 2024-01-23 DIAGNOSIS — E785 Hyperlipidemia, unspecified: Secondary | ICD-10-CM | POA: Diagnosis not present

## 2024-01-23 DIAGNOSIS — E538 Deficiency of other specified B group vitamins: Secondary | ICD-10-CM | POA: Diagnosis not present

## 2024-01-23 DIAGNOSIS — I110 Hypertensive heart disease with heart failure: Secondary | ICD-10-CM | POA: Diagnosis not present

## 2024-01-23 DIAGNOSIS — I5020 Unspecified systolic (congestive) heart failure: Secondary | ICD-10-CM | POA: Diagnosis not present

## 2024-01-25 DIAGNOSIS — Z1231 Encounter for screening mammogram for malignant neoplasm of breast: Secondary | ICD-10-CM | POA: Diagnosis not present

## 2024-01-30 DIAGNOSIS — R82998 Other abnormal findings in urine: Secondary | ICD-10-CM | POA: Diagnosis not present

## 2024-01-30 DIAGNOSIS — I5020 Unspecified systolic (congestive) heart failure: Secondary | ICD-10-CM | POA: Diagnosis not present

## 2024-01-30 DIAGNOSIS — I2584 Coronary atherosclerosis due to calcified coronary lesion: Secondary | ICD-10-CM | POA: Diagnosis not present

## 2024-01-30 DIAGNOSIS — Z1331 Encounter for screening for depression: Secondary | ICD-10-CM | POA: Diagnosis not present

## 2024-01-30 DIAGNOSIS — I110 Hypertensive heart disease with heart failure: Secondary | ICD-10-CM | POA: Diagnosis not present

## 2024-01-30 DIAGNOSIS — G4700 Insomnia, unspecified: Secondary | ICD-10-CM | POA: Diagnosis not present

## 2024-01-30 DIAGNOSIS — B351 Tinea unguium: Secondary | ICD-10-CM | POA: Diagnosis not present

## 2024-01-30 DIAGNOSIS — Z85118 Personal history of other malignant neoplasm of bronchus and lung: Secondary | ICD-10-CM | POA: Diagnosis not present

## 2024-01-30 DIAGNOSIS — Z1339 Encounter for screening examination for other mental health and behavioral disorders: Secondary | ICD-10-CM | POA: Diagnosis not present

## 2024-01-30 DIAGNOSIS — Z0000 Encounter for general adult medical examination without abnormal findings: Secondary | ICD-10-CM | POA: Diagnosis not present

## 2024-01-30 DIAGNOSIS — R413 Other amnesia: Secondary | ICD-10-CM | POA: Diagnosis not present

## 2024-01-30 DIAGNOSIS — I700 Atherosclerosis of aorta: Secondary | ICD-10-CM | POA: Diagnosis not present

## 2024-01-30 DIAGNOSIS — G4733 Obstructive sleep apnea (adult) (pediatric): Secondary | ICD-10-CM | POA: Diagnosis not present

## 2024-01-30 DIAGNOSIS — E785 Hyperlipidemia, unspecified: Secondary | ICD-10-CM | POA: Diagnosis not present

## 2024-01-30 DIAGNOSIS — J449 Chronic obstructive pulmonary disease, unspecified: Secondary | ICD-10-CM | POA: Diagnosis not present

## 2024-01-30 DIAGNOSIS — F331 Major depressive disorder, recurrent, moderate: Secondary | ICD-10-CM | POA: Diagnosis not present

## 2024-01-30 DIAGNOSIS — I2510 Atherosclerotic heart disease of native coronary artery without angina pectoris: Secondary | ICD-10-CM | POA: Diagnosis not present

## 2024-02-08 ENCOUNTER — Telehealth: Payer: Self-pay | Admitting: Medical Oncology

## 2024-02-08 NOTE — Telephone Encounter (Signed)
 Pt said she had labs cbc/diff and cmp  completed on 01/23/2024 @ Dr Larnell. Lab appt cancelled.

## 2024-02-13 ENCOUNTER — Other Ambulatory Visit: Payer: Self-pay | Admitting: Interventional Cardiology

## 2024-02-14 ENCOUNTER — Other Ambulatory Visit: Payer: Medicare Other

## 2024-02-14 ENCOUNTER — Ambulatory Visit
Admission: RE | Admit: 2024-02-14 | Discharge: 2024-02-14 | Disposition: A | Discharge status: Home / Self Care | Source: Ambulatory Visit | Attending: Internal Medicine | Admitting: Internal Medicine

## 2024-02-14 DIAGNOSIS — C3490 Malignant neoplasm of unspecified part of unspecified bronchus or lung: Secondary | ICD-10-CM | POA: Diagnosis not present

## 2024-02-14 MED ORDER — IOPAMIDOL (ISOVUE-300) INJECTION 61%
80.0000 mL | Freq: Once | INTRAVENOUS | Status: AC | PRN
  Administered 2024-02-14: 80 mL via INTRAVENOUS

## 2024-02-21 ENCOUNTER — Inpatient Hospital Stay: Payer: Medicare Other | Attending: Internal Medicine | Admitting: Internal Medicine

## 2024-02-21 VITALS — BP 145/79 | HR 86 | Temp 97.3°F | Resp 17 | Ht 65.0 in | Wt 170.3 lb

## 2024-02-21 DIAGNOSIS — R2681 Unsteadiness on feet: Secondary | ICD-10-CM | POA: Insufficient documentation

## 2024-02-21 DIAGNOSIS — R4189 Other symptoms and signs involving cognitive functions and awareness: Secondary | ICD-10-CM | POA: Diagnosis not present

## 2024-02-21 DIAGNOSIS — Z902 Acquired absence of lung [part of]: Secondary | ICD-10-CM | POA: Diagnosis not present

## 2024-02-21 DIAGNOSIS — R296 Repeated falls: Secondary | ICD-10-CM | POA: Diagnosis not present

## 2024-02-21 DIAGNOSIS — C3490 Malignant neoplasm of unspecified part of unspecified bronchus or lung: Secondary | ICD-10-CM | POA: Diagnosis not present

## 2024-02-21 DIAGNOSIS — R911 Solitary pulmonary nodule: Secondary | ICD-10-CM | POA: Insufficient documentation

## 2024-02-21 DIAGNOSIS — Z85118 Personal history of other malignant neoplasm of bronchus and lung: Secondary | ICD-10-CM | POA: Insufficient documentation

## 2024-02-21 DIAGNOSIS — R059 Cough, unspecified: Secondary | ICD-10-CM | POA: Diagnosis not present

## 2024-02-21 NOTE — Progress Notes (Signed)
 [facility] [phone]   [phone]  OFFICE PROGRESS NOTE  [name] Hamilton, MD [address] [place] [address]  DIAGNOSIS: Primary cancer of right lower lobe of lung   Staging form: Lung, AJCC 7th Edition     Clinical: Stage IIIA (T2a, N2, M0) - Unsigned       Staging comments: Adenocarcinoma  Foundation 1 molecular studies: positive for [id number], KRASG13C, [id number]*, [id number]*11, GATA6 amplification and [id number]. Negative for BRAF, ALK, MET, RET and ERBB2  PRIOR THERAPY:  1) Concurrent chemoradiation with chemotherapy in the form of weekly carboplatin  for an AUC of 2 and paclitaxel  45 mg/m given concurrent with radiation 2) Right VATS (video-assisted thoracoscopic surgery) with right lower lobectomy and mediastinal lymph node dissection under the care of Dr. Fleeta Ochoa on 07/23/2014. The final pathology showed residual 1.8 cm moderate to poorly differentiated invasive adenocarcinoma with no pleural or lymphovascular invasion. The dissected lymph nodes were negative for malignancy and the final pathologic stage was (pT1a, pN0, pMx).  CURRENT THERAPY: Observation.   INTERVAL HISTORY: [name] [name] 68 y.o. female returns to the clinic today for annual follow-up visit accompanied by her daughter Geni Discussed the use of AI scribe software for clinical note transcription with the patient, who gave verbal consent to proceed.  History of Present Illness [name] [name] is a 68 year old female with stage IIIA non-small cell lung cancer who presents for evaluation with repeat CT scan of the chest for restaging of her disease. She is accompanied by her daughter, Geni.  She has a history of stage IIIA non-small cell lung cancer, adenocarcinoma, and underwent concurrent chemoradiation with weekly carboplatin  and paclitaxel  followed by right lower lobectomy with mediastinal lymph node dissection in February 2016. Since then, she  has been under observation.  Over the past three weeks, she has experienced episodes of uncontrollable coughing, most recently a few days ago, reminiscent of symptoms she had prior to her cancer diagnosis. She is concerned about a new finding on her CT scan, which noted an unchanged and nonspecific ground glass nodule in the posterior left upper lobe, although she was not previously aware of this nodule.  She reports experiencing issues with loss of balance and falls over the last three months. She sees a neurologist for mild cognitive decline and was last seen in May. She has noticed increased forgetfulness, such as forgetting to turn off her car, which she finds concerning.  She has a history of emphysema and coronary artery disease, for which she sees a pulmonologist, Dr. Shelah, and a cardiologist yearly. She has a stent placed for her coronary artery disease.  She lives with her cousin, who lives two minutes away and visits often. No pain, swelling in the ankles, or other new symptoms.     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 she was given albuterol  inhaler & had steroid with surg.     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, transient, takes Verapimil for migraine & BP control    Mild obstructive sleep apnea  07/28/2021   Pain of right thumb 06/24/2017   Plantar fasciitis of left foot 03/12/2022   PONV (postoperative nausea and vomiting)    Primary cancer of right lower lobe of lung 04/17/2014   Shortness of breath 09/17/2019   Sprain of lateral ligament of ankle joint 09/11/2021   Subjective memory complaints 07/16/2022   Tenosynovitis of both hands 12/04/2021   Thyroid  nodule    benign by biopsy   Tightness of left gastrocnemius muscle 03/12/2022   Transient visual loss 01/25/2012   Varicose veins    left leg   Vitamin D  deficiency    Wears glasses     ALLERGIES:  is allergic to advil [ibuprofen], amoxicillin -pot clavulanate, carvedilol , codeine, hydrocodone -guaifenesin, sulfa antibiotics, temazepam , plasticized base [plastibase], and zofran  [ondansetron  hcl].  MEDICATIONS:  Current Outpatient Medications  Medication Sig Dispense Refill   aspirin  EC 81 MG tablet Take 1 tablet (81 mg total) by mouth daily. Swallow whole. 30 tablet 12   clopidogrel  (PLAVIX ) 75 MG tablet Take 1 tablet (75 mg total) by mouth daily. 90 tablet 3   loratadine  (CLARITIN ) 10 MG tablet Take 10 mg by mouth daily as needed for rhinitis or allergies.     nitroGLYCERIN  (NITROSTAT ) 0.4 MG SL tablet Place 1 tablet (0.4 mg total) under the tongue every 5 (five) minutes as needed for chest pain. 25 tablet 6   rosuvastatin  (CRESTOR ) 20 MG tablet TAKE 1 TABLET BY MOUTH DAILY 90 tablet 1   sacubitril -valsartan  (ENTRESTO ) 97-103 MG TAKE 1 TABLET BY MOUTH 2 TIMES A DAY 60 tablet 11   spironolactone  (ALDACTONE ) 25 MG tablet Take 0.5 tablets (12.5 mg total) by mouth daily. 45 tablet 3   Suvorexant  (BELSOMRA ) 10 MG TABS Take 1 tablet (10 mg total) by mouth at bedtime as needed. 30 tablet 0   Tiotropium Bromide -Olodaterol (STIOLTO RESPIMAT ) 2.5-2.5 MCG/ACT AERS Inhale 2 puffs into the lungs daily. (Patient not taking: Reported on 10/12/2023)     VENTOLIN  HFA 108 (90 Base) MCG/ACT inhaler INHALE 1 PUFF BY MOUTH THREE TIMES DAILY AS  NEEDED FOR WHEEZING FOR SHORTNESS OF BREATH 18 g 0   Vitamin D , Ergocalciferol , (DRISDOL) 50000 units CAPS capsule Take 50,000 Units by mouth every 7 (seven) days.     No current facility-administered medications for this visit.    SURGICAL HISTORY:  Past Surgical History:  Procedure Laterality Date   CARPAL TUNNEL RELEASE Left    CESAREAN SECTION     COLONOSCOPY W/ BIOPSIES AND POLYPECTOMY     CORONARY STENT INTERVENTION N/A 01/26/2023   Procedure: CORONARY STENT INTERVENTION;  Surgeon: Dann Candyce RAMAN, MD;  Location: [facility] INVASIVE CV LAB;  Service: Cardiovascular;  Laterality: N/A;   DILATION AND CURETTAGE OF UTERUS     RIGHT/LEFT HEART CATH AND CORONARY ANGIOGRAPHY N/A 01/26/2023   Procedure: RIGHT/LEFT HEART CATH AND CORONARY ANGIOGRAPHY;  Surgeon: Dann Candyce RAMAN, MD;  Location[facility] INVASIVE CV LAB;  Service: Cardiovascular;  Laterality: N/A;   TONSILLECTOMY     VARICOSE VEIN SURGERY     left   VIDEO ASSISTED THORACOSCOPY (VATS)/ LOBECTOMY Right 07/23/2014   Procedure: VIDEO ASSISTED THORACOSCOPY (VATS)/ LOBECTOMY;  Surgeon: Maude Fleeta Ochoa, MD;  Location: [facility] OR;  Service:  Thoracic;  Laterality: Right;   VIDEO BRONCHOSCOPY N/A 07/23/2014   Procedure: VIDEO BRONCHOSCOPY;  Surgeon: Maude Fleeta Ochoa, MD;  Location: [facility] OR;  Service: Thoracic;  Laterality: N/A;   VIDEO BRONCHOSCOPY WITH ENDOBRONCHIAL ULTRASOUND Right 04/17/2014   Procedure: VIDEO BRONCHOSCOPY WITH ENDOBRONCHIAL ULTRASOUND;  Surgeon: Lamar GORMAN Chris, MD;  Location: [facility] OR;  Service: Thoracic;  Laterality: Right;   VIDEO BRONCHOSCOPY WITH ENDOBRONCHIAL ULTRASOUND N/A 07/08/2014   Procedure: VIDEO BRONCHOSCOPY WITH ENDOBRONCHIAL ULTRASOUND;  Surgeon: Maude Fleeta Ochoa, MD;  Location: [facility] OR;  Service: Thoracic;  Laterality: N/A;    REVIEW OF SYSTEMS:  Constitutional: positive for fatigue Eyes: negative Ears, nose, mouth, throat, and face: negative Respiratory: positive for cough and dyspnea on exertion Cardiovascular:  negative Gastrointestinal: negative Genitourinary:negative Integument/breast: negative Hematologic/lymphatic: negative Musculoskeletal:negative Neurological: positive for coordination problems and memory problems Behavioral/Psych: negative Endocrine: negative Allergic/Immunologic: negative   PHYSICAL EXAMINATION: General appearance: alert, cooperative, fatigued, and no distress Head: Normocephalic, without obvious abnormality, atraumatic Neck: no adenopathy, no JVD, supple, symmetrical, trachea midline, and thyroid  not enlarged, symmetric, no tenderness/mass/nodules Lymph nodes: Cervical, supraclavicular, and axillary nodes normal. Resp: clear to auscultation bilaterally Back: symmetric, no curvature. ROM normal. No CVA tenderness. Cardio: regular rate and rhythm, S1, S2 normal, no murmur, click, rub or gallop GI: soft, non-tender; bowel sounds normal; no masses,  no organomegaly Extremities: extremities normal, atraumatic, no cyanosis or edema Neurologic: Alert and oriented X 3, normal strength and tone. Normal symmetric reflexes. Normal coordination and gait  ECOG PERFORMANCE STATUS: 1 - Symptomatic but completely ambulatory  Blood pressure (!) 145/79, pulse 86, temperature (!) 97.3 F (36.3 C), resp. rate 17, height 5' 5 (1.651 m), weight 170 lb 4.8 oz (77.2 kg), SpO2 98%.  LABORATORY DATA: Lab Results  Component Value Date   WBC 6.2 02/15/2023   HGB 13.7 02/15/2023   HCT 41.0 02/15/2023   MCV 91.3 02/15/2023   PLT 272 02/15/2023      Chemistry      Component Value Date/Time   NA 138 05/02/2023 1424   NA 142 01/19/2017 0958   K 4.7 05/02/2023 1424   K 4.3 01/19/2017 0958   CL 103 05/02/2023 1424   CO2 22 05/02/2023 1424   CO2 25 01/19/2017 0958   BUN 16 05/02/2023 1424   BUN 15.2 01/19/2017 0958   CREATININE 0.87 05/02/2023 1424   CREATININE 0.87 02/15/2023 0949   CREATININE 0.8 01/19/2017 0958      Component Value Date/Time   CALCIUM  10.1 05/02/2023 1424    CALCIUM  9.5 01/19/2017 0958   ALKPHOS 87 02/15/2023 0949   ALKPHOS 121 01/19/2017 0958   AST 19 02/15/2023 0949   AST 19 01/19/2017 0958   ALT 15 02/15/2023 0949   ALT 20 01/19/2017 0958   BILITOT 0.7 02/15/2023 0949   BILITOT 0.64 01/19/2017 0958       RADIOGRAPHIC STUDIES: CT Chest W Contrast Result Date: 02/18/2024 CLINICAL DATA:  Non-small-cell lung cancer restaging * Tracking Code: BO * EXAM: CT CHEST WITH CONTRAST TECHNIQUE: Multidetector CT imaging of the chest was performed during intravenous contrast administration. RADIATION DOSE REDUCTION: This exam was performed according to the departmental dose-optimization program which includes automated exposure control, adjustment of the mA and/or kV according to patient size and/or use of iterative reconstruction technique. CONTRAST:  80mL ISOVUE -300 IOPAMIDOL  (ISOVUE -300) INJECTION 61% COMPARISON:  02/15/2023 FINDINGS: Cardiovascular: Scattered aortic atherosclerosis. Normal heart size. Left and right coronary artery calcifications. No pericardial effusion. Mediastinum/Nodes: No enlarged mediastinal, hilar, or axillary lymph  nodes. Unchanged fluid in the superior pericardial recess, which does not reflect an enlarged pretracheal lymph node (series 2, image 66). Thyroid  gland, trachea, and esophagus demonstrate no significant findings. Lungs/Pleura: Status post right lower lobectomy. Mild centrilobular emphysema. Scarring of the lung bases. Unchanged ground-glass nodule of the posterior left upper lobe measuring 0.6 cm (series 8, image 50). No pleural effusion or pneumothorax. Upper Abdomen: No acute abnormality. Musculoskeletal: No chest wall abnormality. No acute osseous findings. IMPRESSION: 1. Status post right lower lobectomy. 2. No evidence of recurrent or metastatic disease in the chest. 3. Unchanged nonspecific ground-glass nodule of the posterior left upper lobe. Attention on follow-up. 4. Emphysema. 5. Coronary artery disease. Aortic  Atherosclerosis (ICD10-I70.0) and Emphysema (ICD10-J43.9). Electronically Signed   By: Marolyn JONETTA Jaksch M.D.   On: 02/18/2024 08:10    ASSESSMENT AND PLAN: This is a very pleasant 68 years old white female with a stage IIIA non-small cell lung cancer status post neoadjuvant concurrent chemoradiation with weekly carboplatin  and paclitaxel  followed by right lower lobectomy and lymph node dissection with the final pathology consistent with a pathological stage pT1a, pN0. The patient has been on observation since 2016. She had repeat CT scan of the chest performed recently.  I personally and independently reviewed the scan and discussed the result with the patient and her daughter.  Her scan showed no concerning findings for disease recurrence or metastasis. Assessment and Plan Assessment & Plan History of stage IIIA lung adenocarcinoma, status post resection and chemoradiation, under surveillance Stage IIIA lung adenocarcinoma treated with chemoradiation and right lower lobectomy with mediastinal lymph node dissection. Currently under surveillance with no evidence of recurrence on recent CT scan. Approaching the ten-year mark since diagnosis, which is a positive prognostic indicator. - Continue surveillance with annual CT scans - Plan to release from oncology care after next year's follow-up if no new symptoms or findings  Stable ground glass nodule, left upper lobe Unchanged ground glass nodule in the left upper lobe on CT scan, likely inflammatory and not concerning for malignancy given its stability over time. - Continue to monitor with annual CT scans  Cognitive decline with balance issues and falls Increased forgetfulness and balance issues over the past three months. Previously evaluated by a neurologist who attributed symptoms to age-related changes. Concern for possible metastasis to the brain given history of lung cancer, although previous MRI in 2023 was normal. - Order MRI of the brain to  rule out metastasis - Discuss results with neurologist if MRI shows abnormalities  Uncontrollable coughing episodes Recent episodes of uncontrollable coughing, reminiscent of symptoms prior to cancer diagnosis. No evidence of cancer recurrence on CT scan. Coughing may be related to underlying emphysema or coronary artery disease. - Continue management of emphysema and coronary artery disease with current specialists - Monitor symptoms and report any changes She was advised to call immediately if she has any other concerning symptoms in the interval. The patient voices understanding of current disease status and treatment options and is in agreement with the current care plan.  All questions were answered. The patient knows to call the clinic with any problems, questions or concerns. We can certainly see the patient much sooner if necessary. The total time spent in the appointment was 30 minutes including review of chart and various tests results, discussions about plan of care and coordination of care plan .  Disclaimer: This note was dictated with voice recognition software. Similar sounding words can inadvertently be transcribed and may not be corrected  upon review.

## 2024-02-21 NOTE — Addendum Note (Signed)
 Addended by: CAROLEE LOA DEL on: 02/21/2024 01:47 PM   Modules accepted: Orders

## 2024-02-23 ENCOUNTER — Encounter: Payer: Self-pay | Admitting: Internal Medicine

## 2024-02-25 ENCOUNTER — Other Ambulatory Visit: Payer: Self-pay | Admitting: Interventional Cardiology

## 2024-02-25 ENCOUNTER — Other Ambulatory Visit: Payer: Self-pay | Admitting: Emergency Medicine

## 2024-02-28 ENCOUNTER — Ambulatory Visit (HOSPITAL_COMMUNITY)
Admission: RE | Admit: 2024-02-28 | Discharge: 2024-02-28 | Disposition: A | Discharge status: Home / Self Care | Source: Ambulatory Visit | Attending: Internal Medicine | Admitting: Internal Medicine

## 2024-02-28 DIAGNOSIS — C3490 Malignant neoplasm of unspecified part of unspecified bronchus or lung: Secondary | ICD-10-CM | POA: Insufficient documentation

## 2024-02-28 DIAGNOSIS — I6782 Cerebral ischemia: Secondary | ICD-10-CM | POA: Diagnosis not present

## 2024-02-28 MED ORDER — GADOBUTROL 1 MMOL/ML IV SOLN
7.0000 mL | Freq: Once | INTRAVENOUS | Status: AC | PRN
  Administered 2024-02-28: 7 mL via INTRAVENOUS

## 2024-03-13 DIAGNOSIS — L570 Actinic keratosis: Secondary | ICD-10-CM | POA: Diagnosis not present

## 2024-03-13 DIAGNOSIS — L812 Freckles: Secondary | ICD-10-CM | POA: Diagnosis not present

## 2024-03-13 DIAGNOSIS — L820 Inflamed seborrheic keratosis: Secondary | ICD-10-CM | POA: Diagnosis not present

## 2024-03-13 DIAGNOSIS — L821 Other seborrheic keratosis: Secondary | ICD-10-CM | POA: Diagnosis not present

## 2024-03-13 DIAGNOSIS — D1801 Hemangioma of skin and subcutaneous tissue: Secondary | ICD-10-CM | POA: Diagnosis not present

## 2024-03-15 ENCOUNTER — Encounter: Payer: Self-pay | Admitting: Podiatry

## 2024-03-15 ENCOUNTER — Ambulatory Visit (INDEPENDENT_AMBULATORY_CARE_PROVIDER_SITE_OTHER): Admitting: Podiatry

## 2024-03-15 VITALS — Ht 65.0 in | Wt 170.3 lb

## 2024-03-15 DIAGNOSIS — L609 Nail disorder, unspecified: Secondary | ICD-10-CM | POA: Diagnosis not present

## 2024-03-15 DIAGNOSIS — L600 Ingrowing nail: Secondary | ICD-10-CM

## 2024-03-15 DIAGNOSIS — B351 Tinea unguium: Secondary | ICD-10-CM

## 2024-03-15 NOTE — Progress Notes (Signed)
 Subjective:   Patient ID: [name] [name], female   DOB: 68 y.o.   MRN: [id number]   HPI Chief Complaint  Patient presents with   Nail Problem    Pt is here due to bilateral great toenails states that both of the nails are chipped concern that it could be a fungus, also has a callous on the side of the left great toe that she wants shave.    68 year old female presents the office with above concerns.  She states send left hallux, medial aspect there is a piece of nail that has started to grow back and cause discomfort.  She tries to trim them.  No swelling, redness or any drainage. No injuries.  The right hallux toenail has become somewhat discolored and is somewhat more thick as well.  No pain at this time.  No swelling, redness or any drainage.  Review of Systems  All other systems reviewed and are negative.       Objective:  Physical Exam  General: AAO x3, NAD  Dermatological: Along the left hallux nail border medial aspect there is a piece of nail that is starting to grow back but it is not attached to the toenail.  This does cause some discomfort.  There is no edema, area of pus or signs of infection at this time. The right hallux toenail is hypertrophic, dystrophic with yellow, brown discoloration distally.  There is no edema, erythema or any signs of infection at this time.  Open lesions bilaterally.  Vascular: Dorsalis Pedis artery and Posterior Tibial artery pedal pulses are 2/4 bilateral with immedate capillary fill time.  There is no pain with calf compression, swelling, warmth, erythema.   Neruologic: Grossly intact via light touch bilateral.   Musculoskeletal: Tenderness along the ingrown toenail left medial nail border.  No other areas of discomfort.  Gait: Unassisted, Nonantalgic.       Assessment:   Left hallux ingrown toenail, right hallux onychodystrophy     Plan:  -Treatment options discussed including all alternatives, risks, and  complications -Etiology of symptoms were discussed  Right hallux onychodystrophy - Debrided the right hallux toenail and sent this for culture, pathology  Left hallux ingrown toenail -At this time, the patient is requesting partial nail removal with chemical matricectomy to the symptomatic portion of the nail. Risks and complications were discussed with the patient for which they understand and written consent was obtained. Under sterile conditions a total of 3 mL of a mixture of 2% lidocaine  plain and 0.5% Marcaine  plain was infiltrated in a hallux block fashion. Once anesthetized, the skin was prepped in sterile fashion. A tourniquet was then applied. Next the medial aspect of hallux nail border was then sharply excised making sure to remove the entire offending nail border. Once the nails were ensured to be removed area was debrided and the underlying skin was intact. There is no purulence identified in the procedure. Next phenol was then applied under standard conditions and copiously irrigated.  Silvadene was applied. A dry sterile dressing was applied. After application of the dressing the tourniquet was removed and there is found to be an immediate capillary refill time to the digit. The patient tolerated the procedure well any complications. Post procedure instructions were discussed the patient for which he verbally understood. Discussed signs/symptoms of infection and directed to call the office immediately should any occur or go directly to the emergency room. In the meantime, encouraged to call the office with any questions, concerns, changes symptoms.  Return in about 2 weeks (around 03/29/2024), or if symptoms worsen or fail to improve, for nail check .  Donnice JONELLE Fees DPM

## 2024-03-15 NOTE — Patient Instructions (Signed)

## 2024-03-16 ENCOUNTER — Telehealth: Payer: Self-pay | Admitting: Podiatry

## 2024-03-16 NOTE — Telephone Encounter (Signed)
 You recommended patient come in on 10/23. Your next available is 11/14 is it okay to schedule patient then?

## 2024-03-20 NOTE — Telephone Encounter (Signed)
 Patient confirmed that she will be here 10/23 4pm

## 2024-03-23 DIAGNOSIS — Z23 Encounter for immunization: Secondary | ICD-10-CM | POA: Diagnosis not present

## 2024-03-29 ENCOUNTER — Encounter: Payer: Self-pay | Admitting: Podiatry

## 2024-03-29 ENCOUNTER — Ambulatory Visit: Admitting: Podiatry

## 2024-03-29 VITALS — Ht 65.0 in | Wt 170.0 lb

## 2024-03-29 DIAGNOSIS — L600 Ingrowing nail: Secondary | ICD-10-CM

## 2024-03-29 DIAGNOSIS — L603 Nail dystrophy: Secondary | ICD-10-CM | POA: Diagnosis not present

## 2024-03-29 NOTE — Progress Notes (Signed)
 Subjective: Chief Complaint  Patient presents with   Nail Problem    Pt is here to f/u on toenail fungus and get results from nail culture.   68 year old female presents the office today for the above concerns.  She states that for the ingrown toenail procedure she has some puffiness around the area.  She has discontinued soaking.  No drainage or pus.  She also presents to discuss nail culture results.  Objective: AAO x3, NAD DP/PT pulses palpable bilaterally, CRT less than 3 seconds Status post partial nail avulsion.  There is still some faint edema erythema on the proximal nail fold but the procedure site is scabbed over appearance of debrided some of this is no drainage or purulence underneath.  No significant pain on exam.  No open lesions. No pain with calf compression, swelling, warmth, erythema  Assessment: Status question avulsion for ingrown toenail, healing well; onychodystrophy  Plan: -All treatment options discussed with the patient including all alternatives, risks, complications.  -Procedure sites healing well.  Did clean some of the scab today make sure is no underlying drainage which there was not.  Still recommend soaking in Epsom salts.  Cover with antibiotic ointment and a bandage.  Monitor for any signs or symptoms of infection.  The erythema does not prove there is any worsening to let me know. -Reviewed no culture results.  Although he was negative could be a false negative.  [facility] for onychomycosis -Patient encouraged to call the office with any questions, concerns, change in symptoms.   Donnice JONELLE Fees DPM

## 2024-04-08 ENCOUNTER — Other Ambulatory Visit: Payer: Self-pay | Admitting: Nurse Practitioner

## 2024-04-09 ENCOUNTER — Ambulatory Visit: Admitting: Podiatry

## 2024-06-14 ENCOUNTER — Ambulatory Visit: Payer: Self-pay

## 2024-06-14 NOTE — Telephone Encounter (Signed)
 FYI Only or Action Required?: Action required by provider: request for appointment.  Patient is followed in Pulmonology for COPD, last seen on 07/29/2023 by Shelah Lamar RAMAN, MD.  Called Nurse Triage reporting Shortness of Breath.  Symptoms began several weeks ago.  Interventions attempted: Rescue inhaler.  Symptoms are: stable.  Triage Disposition: See PCP Within 2 Weeks  Patient/caregiver understands and will follow disposition?: Yes Reason for Disposition  [1] MILD longstanding difficulty breathing (e.g., minimal/no SOB at rest, SOB with walking, pulse < 100) AND [2] SAME as normal  Answer Assessment - Initial Assessment Questions Patient saw PCP today for this issue, CXR done and showed some infiltrates, prescribed Levaquin. thoracic spine which showed some minor arthritic changes, scheduling for MRI of thoracic spine. PCP wanted patient to be seen sooner by pulm in next 7-10 days. Using Rescue inhaler prn. No available appointments until February, PCP is recommending patient be seen within next 7-10 days. Please advise.  1. RESPIRATORY STATUS: Describe your breathing? (e.g., wheezing, shortness of breath, unable to speak, severe coughing)      Noticing minor Increased SOB, sometimes with talking, or exertion. Pain on the back near bra line when standing still for 15-20 minutes  2. ONSET: When did this breathing problem begin?      Last few months  3. PATTERN Does the difficult breathing come and go, or has it been constant since it started?      Only when standing  4. SEVERITY: How bad is your breathing? (e.g., mild, moderate, severe)      Severe but relieved with rest  5. RECURRENT SYMPTOM: Have you had difficulty breathing before? If Yes, ask: When was the last time? and What happened that time?      Denies  6. CARDIAC HISTORY: Do you have any history of heart disease? (e.g., heart attack, angina, bypass surgery, angioplasty)      CAD   7. LUNG HISTORY: Do  you have any history of lung disease?  (e.g., pulmonary embolus, asthma, emphysema)     COPD  8. CAUSE: What do you think is causing the breathing problem?      Unsure  9. OTHER SYMPTOMS: Do you have any other symptoms? (e.g., chest pain, cough, dizziness, fever, runny nose)     Back pain by bra line  10. O2 SATURATION MONITOR:  Do you use an oxygen saturation monitor (pulse oximeter) at home? If Yes, ask: What is your reading (oxygen level) today? What is your usual oxygen saturation reading? (e.g., 95%)       Unable to assess at home, was 98% at doctors visit today.  Protocols used: Breathing Difficulty-A-AH  Copied from CRM [id number]. Topic: Clinical - Red Word Triage >> Jun 14, 2024  4:24 PM [name] [name] wrote: Red Word that prompted transfer to Nurse Triage: Shortness of breath

## 2024-06-14 NOTE — Telephone Encounter (Signed)
 I called and spoke with patient, she saw her PCP today and she is to start Levaquin.  She had infiltrates on her CXR.  Her PCP wants her to f/u with our office in about a week.  I scheduled her an OV with Dr. Shelah on 07/04/24 at 8:45 am, advised to arrive at 8:30 for check in.  She verbalized understanding.  Nothing further needed.

## 2024-06-15 ENCOUNTER — Other Ambulatory Visit: Payer: Self-pay | Admitting: Family Medicine

## 2024-06-15 DIAGNOSIS — M546 Pain in thoracic spine: Secondary | ICD-10-CM

## 2024-07-04 ENCOUNTER — Ambulatory Visit: Admitting: Emergency Medicine

## 2024-07-09 ENCOUNTER — Other Ambulatory Visit: Payer: Self-pay | Admitting: Cardiology

## 2024-07-11 ENCOUNTER — Other Ambulatory Visit: Payer: Self-pay | Admitting: Cardiology

## 2024-07-12 ENCOUNTER — Ambulatory Visit: Admitting: Emergency Medicine

## 2024-07-12 ENCOUNTER — Encounter: Payer: Self-pay | Admitting: Emergency Medicine

## 2024-07-12 VITALS — BP 136/72 | HR 86 | Ht 65.0 in | Wt 176.0 lb

## 2024-07-12 DIAGNOSIS — R911 Solitary pulmonary nodule: Secondary | ICD-10-CM | POA: Insufficient documentation

## 2024-07-12 DIAGNOSIS — J189 Pneumonia, unspecified organism: Secondary | ICD-10-CM | POA: Insufficient documentation

## 2024-07-12 DIAGNOSIS — J449 Chronic obstructive pulmonary disease, unspecified: Secondary | ICD-10-CM | POA: Diagnosis not present

## 2024-07-12 MED ORDER — SPIRIVA RESPIMAT 2.5 MCG/ACT IN AERS: 2.0000 | INHALATION_SPRAY | Freq: Every day | RESPIRATORY_TRACT | 11 refills | Status: AC

## 2024-07-12 MED ORDER — SACUBITRIL-VALSARTAN 97-103 MG PO TABS: 1.0000 | ORAL_TABLET | Freq: Two times a day (BID) | ORAL | 0 refills | Status: AC

## 2024-07-12 NOTE — Patient Instructions (Addendum)
 We reviewed your CT scan of the chest from September 2025. We will cancel your CT chest with contrast and change it to a CT chest without contrast to be done in September 2026 to compare with your priors. Please try starting Stiolto 2 puffs once daily.  Keep track of how this medication helps you so we can discuss at your next visit. Okay to keep albuterol  available to use 2 puffs if needed for shortness of breath, chest tightness, wheezing. Follow Dr. Shelah later this month as planned so we can see how your breathing is doing on the new medication.

## 2024-07-12 NOTE — Progress Notes (Signed)
 "  Subjective:    Patient ID: [name] [name], female    DOB: [birth year], 69 y.o.   MRN: [id number]  COPD She complains of cough and shortness of breath. There is no wheezing. Pertinent negatives include no ear pain, fever, headaches, postnasal drip, rhinorrhea, sneezing, sore throat or trouble swallowing. Her past medical history is significant for COPD.   ROV 07/29/2023 --follow-up visit for pleasant 69 year old woman with a history of COPD and a right lower lobe lobectomy + neoadjuvant chemo for adenocarcinoma.  She has very mild OSA, not on CPAP.  She also follows with Dr. Jeffrie for CAD, hypertension and systolic/diastolic dysfunction.  I saw her in August at which time she was having progressive dyspnea with exertion.  We talked about a re-trial of Stiolto.  Since I saw her then she has been evaluated by cardiology and underwent PTCI.  She reports soon after I saw her in August > had a stent placed. Her breathing has improved. She will still have some SOB w exertion, but is able to go upstairs now. No wheeze. She also did cardiac rehab - also seemed to help her exercise tolerance some. She sometimes pre-treated her exercise w SABA, but didn't see a big difference. No desats seen at cardiac rehab w exercise.   CT chest 02/15/23 (MM) > no evidence of recurrence or new findings.  ROV 07/12/2024 --69 year old woman with a history of COPD, right lower lobe lobectomy and neoadjuvant chemotherapy for adenocarcinoma, mild OSA, CAD with hypertension and systolic and dysfunction.  She has been having some increased SOB, increased paroxysmal coughing episodes. Started in August. Has had some upper back pain between her shoulder blades. There was a possible infiltrate on her CXR. She was treated w 10 days levofloxacin and her cough and dyspnea are both better. Has also had T spine imaging done.   CT chest 02/14/2024 reviewed by me showed changes consistent with a right lower lobectomy, no evidence of  recurrent or metastatic disease, unchanged nonspecific ground glass nodule in the posterior left upper lobe 6 mm.  No adenopathy.   Review of Systems  Constitutional:  Negative for fever and unexpected weight change.  HENT:  Negative for congestion, dental problem, ear pain, nosebleeds, postnasal drip, rhinorrhea, sinus pressure, sneezing, sore throat and trouble swallowing.   Eyes:  Negative for redness and itching.  Respiratory:  Positive for cough and shortness of breath. Negative for chest tightness and wheezing.   Cardiovascular:  Negative for palpitations and leg swelling.  Gastrointestinal:  Negative for nausea and vomiting.  Genitourinary:  Negative for dysuria.  Musculoskeletal:  Negative for joint swelling.  Skin:  Negative for rash.  Neurological:  Negative for headaches.  Hematological:  Does not bruise/bleed easily.  Psychiatric/Behavioral:  Negative for dysphoric mood. The patient is not nervous/anxious.        Objective:   Physical Exam Vitals:   07/12/24 1317  BP: 136/72  Pulse: 86  SpO2: 94%  Weight: 176 lb (79.8 kg)  Height: 5' 5 (1.651 m)    Gen: Pleasant, well-nourished, in no distress,  normal affect  ENT: No lesions,  mouth clear,  oropharynx clear, no postnasal drip, no stridor  Lungs: No use of accessory muscles, no crackles, no active wheezing  Cardiovascular: RRR, heart sounds normal, no murmur or gallops, no peripheral edema  Musculoskeletal: No deformities, no cyanosis or clubbing  Neuro: alert, non focal  Skin: Warm, no lesions or rashes      Assessment & Plan:  Pulmonary nodule Reviewed her CT chest with her.  I do not see any evidence of recurrence of her lung cancer.  She does have a 6 mm ground glass left upper lobe pulmonary nodule.  I believe this is visible going back as far as 2023.  Stable compared with her priors.  She does need serial follow-up.  She currently has a CT with contrast planned for 02/2025.  I will change this to a  noncontrasted CT chest which should be adequate.  Follow-up with me to review.  COPD GOLD II B  More exertional dyspnea, decreased functional capacity.  I think she needs to do a trial of Spiriva .  She is willing to do so.  I will send a prescription today.  Follow-up later this month as planned to see if she is getting any benefit.  Community acquired pneumonia Reports that she was treated for a pneumonia with cough, dyspnea, pain in her upper back between her shoulder blades.  She reports there was a possible infiltrate on chest x-ray (I do not have this available to review).  She was treated with levofloxacin and the symptoms improved.  I personally spent a total of 34 minutes in the care of the patient today including preparing to see the patient, getting/reviewing separately obtained history, performing a medically appropriate exam/evaluation, counseling and educating, placing orders, documenting clinical information in the EHR, independently interpreting results, and communicating results.    Lamar Chris, MD, PhD 07/12/2024, 4:55 PM [facility] [phone] or if no answer [phone]  "

## 2024-07-12 NOTE — Assessment & Plan Note (Signed)
 More exertional dyspnea, decreased functional capacity.  I think she needs to do a trial of Spiriva .  She is willing to do so.  I will send a prescription today.  Follow-up later this month as planned to see if she is getting any benefit.

## 2024-07-12 NOTE — Assessment & Plan Note (Signed)
 Reports that she was treated for a pneumonia with cough, dyspnea, pain in her upper back between her shoulder blades.  She reports there was a possible infiltrate on chest x-ray (I do not have this available to review).  She was treated with levofloxacin and the symptoms improved.

## 2024-07-12 NOTE — Assessment & Plan Note (Signed)
 Reviewed her CT chest with her.  I do not see any evidence of recurrence of her lung cancer.  She does have a 6 mm ground glass left upper lobe pulmonary nodule.  I believe this is visible going back as far as 2023.  Stable compared with her priors.  She does need serial follow-up.  She currently has a CT with contrast planned for 02/2025.  I will change this to a noncontrasted CT chest which should be adequate.  Follow-up with me to review.

## 2024-07-12 NOTE — Telephone Encounter (Signed)
 Labs can be Completed at next O/V

## 2024-07-13 ENCOUNTER — Telehealth: Payer: Self-pay

## 2024-07-13 ENCOUNTER — Telehealth: Payer: Self-pay | Admitting: Internal Medicine

## 2024-07-13 MED ORDER — STIOLTO RESPIMAT 2.5-2.5 MCG/ACT IN AERS: 2.0000 | INHALATION_SPRAY | Freq: Every day | RESPIRATORY_TRACT | 6 refills | Status: AC

## 2024-07-13 NOTE — Telephone Encounter (Signed)
 Copied from CRM [id number]. Topic: Clinical - Prescription Issue >> Jul 13, 2024 10:45 AM [name] [name] wrote: Reason for CRM: Patient says Stiolto was supposed to be sent to pharmacy, but Spiriva  was sent. Patient says she tried Spiriva  before and didn't work well for her. Please send Stiolto to [facility] on [address].    Spoke with patient new Rx has been sent to pharmacy

## 2024-07-16 ENCOUNTER — Other Ambulatory Visit

## 2024-07-26 ENCOUNTER — Ambulatory Visit: Admitting: Cardiology

## 2024-08-01 ENCOUNTER — Ambulatory Visit: Admitting: Emergency Medicine

## 2024-10-11 ENCOUNTER — Ambulatory Visit: Admitting: Neurology

## 2025-02-12 ENCOUNTER — Other Ambulatory Visit

## 2025-02-18 ENCOUNTER — Other Ambulatory Visit

## 2025-02-20 ENCOUNTER — Ambulatory Visit: Admitting: Internal Medicine
